# Patient Record
Sex: Male | Born: 1955 | Race: White | Hispanic: No | Marital: Single | Smoking: Former smoker
Health system: Southern US, Community
[De-identification: ages and names within clinical notes are randomized; demographics above are authoritative.]

## PROBLEM LIST (undated history)

## (undated) DIAGNOSIS — K649 Unspecified hemorrhoids: Secondary | ICD-10-CM

## (undated) DIAGNOSIS — F329 Major depressive disorder, single episode, unspecified: Secondary | ICD-10-CM

## (undated) DIAGNOSIS — F419 Anxiety disorder, unspecified: Secondary | ICD-10-CM

## (undated) DIAGNOSIS — J449 Chronic obstructive pulmonary disease, unspecified: Secondary | ICD-10-CM

## (undated) DIAGNOSIS — Z9889 Other specified postprocedural states: Secondary | ICD-10-CM

## (undated) DIAGNOSIS — J189 Pneumonia, unspecified organism: Secondary | ICD-10-CM

## (undated) DIAGNOSIS — I10 Essential (primary) hypertension: Secondary | ICD-10-CM

## (undated) DIAGNOSIS — R112 Nausea with vomiting, unspecified: Secondary | ICD-10-CM

## (undated) DIAGNOSIS — Z8719 Personal history of other diseases of the digestive system: Secondary | ICD-10-CM

## (undated) DIAGNOSIS — R002 Palpitations: Secondary | ICD-10-CM

## (undated) DIAGNOSIS — Z923 Personal history of irradiation: Secondary | ICD-10-CM

## (undated) DIAGNOSIS — J45909 Unspecified asthma, uncomplicated: Secondary | ICD-10-CM

## (undated) DIAGNOSIS — F32A Depression, unspecified: Secondary | ICD-10-CM

## (undated) HISTORY — DX: Pneumonia, unspecified organism: J18.9

## (undated) HISTORY — DX: Personal history of other diseases of the digestive system: Z87.19

## (undated) HISTORY — DX: Palpitations: R00.2

## (undated) HISTORY — DX: Other specified postprocedural states: Z98.890

## (undated) HISTORY — DX: Unspecified hemorrhoids: K64.9

## (undated) HISTORY — DX: Other specified postprocedural states: R11.2

## (undated) HISTORY — PX: TONSILLECTOMY: SUR1361

## (undated) HISTORY — PX: DENTAL SURGERY: SHX609

## (undated) HISTORY — PX: SKIN CANCER EXCISION: SHX779

## (undated) HISTORY — PX: NOSE SURGERY: SHX723

---

## 1998-04-15 ENCOUNTER — Emergency Department (HOSPITAL_COMMUNITY): Admission: EM | Admit: 1998-04-15 | Discharge: 1998-04-15 | Payer: Self-pay | Admitting: Emergency Medicine

## 1998-05-03 ENCOUNTER — Emergency Department (HOSPITAL_COMMUNITY): Admission: EM | Admit: 1998-05-03 | Discharge: 1998-05-04 | Payer: Self-pay | Admitting: Emergency Medicine

## 1999-02-21 ENCOUNTER — Emergency Department (HOSPITAL_COMMUNITY): Admission: EM | Admit: 1999-02-21 | Discharge: 1999-02-21 | Payer: Self-pay | Admitting: Emergency Medicine

## 1999-02-21 ENCOUNTER — Encounter: Payer: Self-pay | Admitting: Emergency Medicine

## 1999-09-08 ENCOUNTER — Emergency Department (HOSPITAL_COMMUNITY): Admission: EM | Admit: 1999-09-08 | Discharge: 1999-09-08 | Payer: Self-pay | Admitting: *Deleted

## 1999-10-18 ENCOUNTER — Encounter: Payer: Self-pay | Admitting: Emergency Medicine

## 1999-10-18 ENCOUNTER — Emergency Department (HOSPITAL_COMMUNITY): Admission: EM | Admit: 1999-10-18 | Discharge: 1999-10-19 | Payer: Self-pay | Admitting: Emergency Medicine

## 2000-03-13 ENCOUNTER — Emergency Department (HOSPITAL_COMMUNITY): Admission: EM | Admit: 2000-03-13 | Discharge: 2000-03-13 | Payer: Self-pay | Admitting: Emergency Medicine

## 2000-07-13 ENCOUNTER — Emergency Department (HOSPITAL_COMMUNITY): Admission: EM | Admit: 2000-07-13 | Discharge: 2000-07-13 | Payer: Self-pay | Admitting: Emergency Medicine

## 2000-07-13 ENCOUNTER — Encounter: Payer: Self-pay | Admitting: Emergency Medicine

## 2000-10-16 ENCOUNTER — Emergency Department (HOSPITAL_COMMUNITY): Admission: EM | Admit: 2000-10-16 | Discharge: 2000-10-16 | Payer: Self-pay | Admitting: Emergency Medicine

## 2000-10-16 ENCOUNTER — Encounter: Payer: Self-pay | Admitting: Emergency Medicine

## 2001-02-12 ENCOUNTER — Emergency Department (HOSPITAL_COMMUNITY): Admission: EM | Admit: 2001-02-12 | Discharge: 2001-02-12 | Payer: Self-pay | Admitting: Emergency Medicine

## 2001-08-02 ENCOUNTER — Encounter: Payer: Self-pay | Admitting: Emergency Medicine

## 2001-08-02 ENCOUNTER — Inpatient Hospital Stay (HOSPITAL_COMMUNITY): Admission: EM | Admit: 2001-08-02 | Discharge: 2001-08-02 | Payer: Self-pay | Admitting: Emergency Medicine

## 2002-01-23 ENCOUNTER — Encounter: Payer: Self-pay | Admitting: Emergency Medicine

## 2002-01-23 ENCOUNTER — Emergency Department (HOSPITAL_COMMUNITY): Admission: EM | Admit: 2002-01-23 | Discharge: 2002-01-23 | Payer: Self-pay | Admitting: Emergency Medicine

## 2002-03-25 ENCOUNTER — Emergency Department (HOSPITAL_COMMUNITY): Admission: EM | Admit: 2002-03-25 | Discharge: 2002-03-25 | Payer: Self-pay | Admitting: Emergency Medicine

## 2002-10-01 ENCOUNTER — Emergency Department (HOSPITAL_COMMUNITY): Admission: EM | Admit: 2002-10-01 | Discharge: 2002-10-01 | Payer: Self-pay | Admitting: Emergency Medicine

## 2003-06-04 ENCOUNTER — Inpatient Hospital Stay (HOSPITAL_COMMUNITY): Admission: EM | Admit: 2003-06-04 | Discharge: 2003-06-05 | Payer: Self-pay | Admitting: Psychiatry

## 2003-06-16 ENCOUNTER — Emergency Department (HOSPITAL_COMMUNITY): Admission: EM | Admit: 2003-06-16 | Discharge: 2003-06-17 | Payer: Self-pay | Admitting: *Deleted

## 2003-06-25 ENCOUNTER — Emergency Department (HOSPITAL_COMMUNITY): Admission: EM | Admit: 2003-06-25 | Discharge: 2003-06-25 | Payer: Self-pay | Admitting: Emergency Medicine

## 2003-06-28 ENCOUNTER — Emergency Department (HOSPITAL_COMMUNITY): Admission: EM | Admit: 2003-06-28 | Discharge: 2003-06-28 | Payer: Self-pay | Admitting: *Deleted

## 2004-07-21 ENCOUNTER — Emergency Department (HOSPITAL_COMMUNITY): Admission: EM | Admit: 2004-07-21 | Discharge: 2004-07-21 | Payer: Self-pay | Admitting: Emergency Medicine

## 2004-12-28 ENCOUNTER — Emergency Department (HOSPITAL_COMMUNITY): Admission: EM | Admit: 2004-12-28 | Discharge: 2004-12-28 | Payer: Self-pay | Admitting: Emergency Medicine

## 2005-11-07 ENCOUNTER — Emergency Department (HOSPITAL_COMMUNITY): Admission: EM | Admit: 2005-11-07 | Discharge: 2005-11-07 | Payer: Self-pay | Admitting: Emergency Medicine

## 2006-03-15 ENCOUNTER — Emergency Department (HOSPITAL_COMMUNITY): Admission: EM | Admit: 2006-03-15 | Discharge: 2006-03-15 | Payer: Self-pay | Admitting: Emergency Medicine

## 2006-12-08 ENCOUNTER — Emergency Department (HOSPITAL_COMMUNITY): Admission: EM | Admit: 2006-12-08 | Discharge: 2006-12-08 | Payer: Self-pay | Admitting: Emergency Medicine

## 2007-06-21 ENCOUNTER — Emergency Department (HOSPITAL_COMMUNITY): Admission: EM | Admit: 2007-06-21 | Discharge: 2007-06-21 | Payer: Self-pay | Admitting: Emergency Medicine

## 2007-08-22 ENCOUNTER — Emergency Department (HOSPITAL_COMMUNITY): Admission: EM | Admit: 2007-08-22 | Discharge: 2007-08-22 | Payer: Self-pay | Admitting: Emergency Medicine

## 2007-12-04 ENCOUNTER — Emergency Department (HOSPITAL_COMMUNITY): Admission: EM | Admit: 2007-12-04 | Discharge: 2007-12-04 | Payer: Self-pay | Admitting: Emergency Medicine

## 2008-07-17 ENCOUNTER — Emergency Department (HOSPITAL_COMMUNITY): Admission: EM | Admit: 2008-07-17 | Discharge: 2008-07-17 | Payer: Self-pay | Admitting: Emergency Medicine

## 2008-09-12 ENCOUNTER — Observation Stay (HOSPITAL_COMMUNITY): Admission: EM | Admit: 2008-09-12 | Discharge: 2008-09-13 | Payer: Self-pay | Admitting: Emergency Medicine

## 2008-10-29 ENCOUNTER — Encounter (HOSPITAL_COMMUNITY): Admission: RE | Admit: 2008-10-29 | Discharge: 2009-01-27 | Payer: Self-pay | Admitting: Interventional Cardiology

## 2009-05-14 ENCOUNTER — Ambulatory Visit: Payer: Self-pay | Admitting: Internal Medicine

## 2009-05-14 ENCOUNTER — Observation Stay (HOSPITAL_COMMUNITY): Admission: EM | Admit: 2009-05-14 | Discharge: 2009-05-15 | Payer: Self-pay | Admitting: Emergency Medicine

## 2009-05-15 ENCOUNTER — Encounter (INDEPENDENT_AMBULATORY_CARE_PROVIDER_SITE_OTHER): Payer: Self-pay | Admitting: Internal Medicine

## 2009-05-24 ENCOUNTER — Emergency Department (HOSPITAL_COMMUNITY): Admission: EM | Admit: 2009-05-24 | Discharge: 2009-05-24 | Payer: Self-pay | Admitting: Emergency Medicine

## 2009-08-15 ENCOUNTER — Emergency Department (HOSPITAL_COMMUNITY): Admission: EM | Admit: 2009-08-15 | Discharge: 2009-08-15 | Payer: Self-pay | Admitting: Emergency Medicine

## 2009-09-24 ENCOUNTER — Emergency Department (HOSPITAL_COMMUNITY): Admission: EM | Admit: 2009-09-24 | Discharge: 2009-09-24 | Payer: Self-pay | Admitting: Emergency Medicine

## 2009-11-28 ENCOUNTER — Emergency Department (HOSPITAL_COMMUNITY): Admission: EM | Admit: 2009-11-28 | Discharge: 2009-11-28 | Payer: Self-pay | Admitting: Emergency Medicine

## 2009-12-11 ENCOUNTER — Emergency Department (HOSPITAL_COMMUNITY): Admission: EM | Admit: 2009-12-11 | Discharge: 2009-12-11 | Payer: Self-pay | Admitting: Emergency Medicine

## 2010-04-05 ENCOUNTER — Emergency Department (HOSPITAL_COMMUNITY)
Admission: EM | Admit: 2010-04-05 | Discharge: 2010-04-05 | Payer: Self-pay | Source: Home / Self Care | Admitting: Emergency Medicine

## 2010-07-26 ENCOUNTER — Emergency Department (HOSPITAL_COMMUNITY)
Admission: EM | Admit: 2010-07-26 | Discharge: 2010-07-26 | Payer: Self-pay | Source: Home / Self Care | Admitting: Emergency Medicine

## 2010-08-30 ENCOUNTER — Emergency Department (HOSPITAL_COMMUNITY): Payer: Self-pay

## 2010-08-30 ENCOUNTER — Emergency Department (HOSPITAL_COMMUNITY)
Admission: EM | Admit: 2010-08-30 | Discharge: 2010-08-30 | Disposition: A | Discharge status: Home / Self Care | Payer: Self-pay | Attending: Emergency Medicine | Admitting: Emergency Medicine

## 2010-08-30 DIAGNOSIS — F411 Generalized anxiety disorder: Secondary | ICD-10-CM | POA: Insufficient documentation

## 2010-08-30 DIAGNOSIS — I10 Essential (primary) hypertension: Secondary | ICD-10-CM | POA: Insufficient documentation

## 2010-08-30 DIAGNOSIS — R0602 Shortness of breath: Secondary | ICD-10-CM | POA: Insufficient documentation

## 2010-08-30 DIAGNOSIS — I498 Other specified cardiac arrhythmias: Secondary | ICD-10-CM | POA: Insufficient documentation

## 2010-08-30 DIAGNOSIS — R079 Chest pain, unspecified: Secondary | ICD-10-CM | POA: Insufficient documentation

## 2010-08-30 LAB — DIFFERENTIAL
Basophils Absolute: 0.1 10*3/uL (ref 0.0–0.1)
Basophils Relative: 1 % (ref 0–1)
Eosinophils Absolute: 0.1 10*3/uL (ref 0.0–0.7)
Eosinophils Relative: 1 % (ref 0–5)
Lymphocytes Relative: 28 % (ref 12–46)
Lymphs Abs: 2.5 10*3/uL (ref 0.7–4.0)
Monocytes Absolute: 0.7 10*3/uL (ref 0.1–1.0)
Monocytes Relative: 8 % (ref 3–12)
Neutro Abs: 5.5 10*3/uL (ref 1.7–7.7)
Neutrophils Relative %: 63 % (ref 43–77)

## 2010-08-30 LAB — CBC
HCT: 42.7 % (ref 39.0–52.0)
Hemoglobin: 15 g/dL (ref 13.0–17.0)
MCH: 32.2 pg (ref 26.0–34.0)
MCHC: 35.1 g/dL (ref 30.0–36.0)
MCV: 91.6 fL (ref 78.0–100.0)
Platelets: 205 10*3/uL (ref 150–400)
RBC: 4.66 MIL/uL (ref 4.22–5.81)
RDW: 12.3 % (ref 11.5–15.5)
WBC: 8.9 10*3/uL (ref 4.0–10.5)

## 2010-08-30 LAB — POCT I-STAT, CHEM 8
BUN: 7 mg/dL (ref 6–23)
Calcium, Ion: 1.12 mmol/L (ref 1.12–1.32)
Chloride: 106 mEq/L (ref 96–112)
Creatinine, Ser: 0.9 mg/dL (ref 0.4–1.5)
Glucose, Bld: 107 mg/dL — ABNORMAL HIGH (ref 70–99)
HCT: 44 % (ref 39.0–52.0)
Hemoglobin: 15 g/dL (ref 13.0–17.0)
Potassium: 4.2 mEq/L (ref 3.5–5.1)
Sodium: 141 mEq/L (ref 135–145)
TCO2: 24 mmol/L (ref 0–100)

## 2010-08-30 LAB — POCT CARDIAC MARKERS
CKMB, poc: <1 ng/mL — ABNORMAL LOW (ref 1.0–8.0)
Myoglobin, poc: 42.9 ng/mL (ref 12–200)
Troponin i, poc: <0.05 ng/mL (ref 0.00–0.09)

## 2010-09-15 LAB — URINALYSIS, ROUTINE W REFLEX MICROSCOPIC
Bilirubin Urine: NEGATIVE
Glucose, UA: NEGATIVE mg/dL
Hgb urine dipstick: NEGATIVE
Ketones, ur: NEGATIVE mg/dL
Nitrite: NEGATIVE
Protein, ur: NEGATIVE mg/dL
Specific Gravity, Urine: 1.006 (ref 1.005–1.030)
Urobilinogen, UA: 0.2 mg/dL (ref 0.0–1.0)
pH: 6 (ref 5.0–8.0)

## 2010-09-15 LAB — URINE CULTURE
Colony Count: NO GROWTH
Culture  Setup Time: 201110031039
Culture: NO GROWTH

## 2010-09-22 LAB — CBC
HCT: 45.5 % (ref 39.0–52.0)
Hemoglobin: 15.9 g/dL (ref 13.0–17.0)
MCHC: 35.1 g/dL (ref 30.0–36.0)
MCV: 95.2 fL (ref 78.0–100.0)
Platelets: 230 10*3/uL (ref 150–400)
RBC: 4.77 MIL/uL (ref 4.22–5.81)
RDW: 12.3 % (ref 11.5–15.5)
WBC: 9.9 10*3/uL (ref 4.0–10.5)

## 2010-09-22 LAB — DIFFERENTIAL
Basophils Absolute: 0.1 10*3/uL (ref 0.0–0.1)
Basophils Relative: 1 % (ref 0–1)
Eosinophils Absolute: 0.2 10*3/uL (ref 0.0–0.7)
Eosinophils Relative: 2 % (ref 0–5)
Lymphocytes Relative: 25 % (ref 12–46)
Lymphs Abs: 2.5 10*3/uL (ref 0.7–4.0)
Monocytes Absolute: 0.7 10*3/uL (ref 0.1–1.0)
Monocytes Relative: 7 % (ref 3–12)
Neutro Abs: 6.6 10*3/uL (ref 1.7–7.7)
Neutrophils Relative %: 66 % (ref 43–77)

## 2010-09-22 LAB — BASIC METABOLIC PANEL
BUN: 7 mg/dL (ref 6–23)
CO2: 27 mEq/L (ref 19–32)
Calcium: 8.7 mg/dL (ref 8.4–10.5)
Chloride: 100 mEq/L (ref 96–112)
Creatinine, Ser: 0.65 mg/dL (ref 0.4–1.5)
GFR calc Af Amer: >60 mL/min (ref >60)
GFR calc non Af Amer: >60 mL/min (ref >60)
Glucose, Bld: 112 mg/dL — ABNORMAL HIGH (ref 70–99)
Potassium: 3.5 mEq/L (ref 3.5–5.1)
Sodium: 132 mEq/L — ABNORMAL LOW (ref 135–145)

## 2010-09-22 LAB — POCT CARDIAC MARKERS
CKMB, poc: <1 ng/mL — ABNORMAL LOW (ref 1.0–8.0)
Myoglobin, poc: 36.9 ng/mL (ref 12–200)
Troponin i, poc: <0.05 ng/mL (ref 0.00–0.09)

## 2010-09-22 LAB — D-DIMER, QUANTITATIVE: D-Dimer, Quant: 0.26 ug/mL-FEU (ref 0.00–0.48)

## 2010-09-25 ENCOUNTER — Emergency Department (HOSPITAL_COMMUNITY)
Admission: EM | Admit: 2010-09-25 | Discharge: 2010-09-25 | Disposition: A | Discharge status: Home / Self Care | Payer: Self-pay | Attending: Emergency Medicine | Admitting: Emergency Medicine

## 2010-09-25 DIAGNOSIS — F411 Generalized anxiety disorder: Secondary | ICD-10-CM | POA: Insufficient documentation

## 2010-09-25 DIAGNOSIS — I10 Essential (primary) hypertension: Secondary | ICD-10-CM | POA: Insufficient documentation

## 2010-09-25 DIAGNOSIS — T448X1A Poisoning by centrally-acting and adrenergic-neuron-blocking agents, accidental (unintentional), initial encounter: Secondary | ICD-10-CM | POA: Insufficient documentation

## 2010-09-25 DIAGNOSIS — R5381 Other malaise: Secondary | ICD-10-CM | POA: Insufficient documentation

## 2010-09-25 LAB — DIFFERENTIAL
Basophils Absolute: 0.1 10*3/uL (ref 0.0–0.1)
Lymphocytes Relative: 31 % (ref 12–46)
Lymphs Abs: 2.6 10*3/uL (ref 0.7–4.0)
Monocytes Absolute: 0.8 10*3/uL (ref 0.1–1.0)
Monocytes Relative: 9 % (ref 3–12)
Neutro Abs: 4.8 10*3/uL (ref 1.7–7.7)

## 2010-09-25 LAB — BASIC METABOLIC PANEL
BUN: 6 mg/dL (ref 6–23)
CO2: 25 mEq/L (ref 19–32)
Calcium: 9.2 mg/dL (ref 8.4–10.5)
Glucose, Bld: 100 mg/dL — ABNORMAL HIGH (ref 70–99)
Sodium: 137 mEq/L (ref 135–145)

## 2010-09-25 LAB — CBC
HCT: 44.2 % (ref 39.0–52.0)
Hemoglobin: 16 g/dL (ref 13.0–17.0)
MCHC: 36.2 g/dL — ABNORMAL HIGH (ref 30.0–36.0)
MCV: 91.5 fL (ref 78.0–100.0)

## 2010-10-06 LAB — COMPREHENSIVE METABOLIC PANEL
BUN: 4 mg/dL — ABNORMAL LOW (ref 6–23)
BUN: 10 mg/dL (ref 6–23)
Calcium: 9 mg/dL (ref 8.4–10.5)
Calcium: 8.9 mg/dL (ref 8.4–10.5)
Creatinine, Ser: 0.7 mg/dL (ref 0.4–1.5)
Glucose, Bld: 96 mg/dL (ref 70–99)
Glucose, Bld: 101 mg/dL — ABNORMAL HIGH (ref 70–99)
Sodium: 135 mEq/L (ref 135–145)
Sodium: 139 mEq/L (ref 135–145)
Total Protein: 6.9 g/dL (ref 6.0–8.3)
Total Protein: 6.7 g/dL (ref 6.0–8.3)

## 2010-10-06 LAB — POCT CARDIAC MARKERS
CKMB, poc: <1 ng/mL — ABNORMAL LOW (ref 1.0–8.0)
CKMB, poc: <1 ng/mL — ABNORMAL LOW (ref 1.0–8.0)
Myoglobin, poc: 70.8 ng/mL (ref 12–200)
Troponin i, poc: <0.05 ng/mL (ref 0.00–0.09)
Troponin i, poc: <0.05 ng/mL (ref 0.00–0.09)

## 2010-10-06 LAB — DIFFERENTIAL
Basophils Absolute: 0.2 10*3/uL — ABNORMAL HIGH (ref 0.0–0.1)
Lymphocytes Relative: 23 % (ref 12–46)
Lymphs Abs: 2.1 10*3/uL (ref 0.7–4.0)
Monocytes Absolute: 0.8 10*3/uL (ref 0.1–1.0)
Monocytes Relative: 7 % (ref 3–12)
Monocytes Relative: 8 % (ref 3–12)
Neutro Abs: 5 10*3/uL (ref 1.7–7.7)
Neutro Abs: 6.9 10*3/uL (ref 1.7–7.7)
Neutrophils Relative %: 65 % (ref 43–77)

## 2010-10-06 LAB — CARDIAC PANEL(CRET KIN+CKTOT+MB+TROPI)
Relative Index: INVALID (ref 0.0–2.5)
Total CK: 58 U/L (ref 7–232)
Troponin I: 0.02 ng/mL (ref 0.00–0.06)

## 2010-10-06 LAB — CBC
HCT: 45.8 % (ref 39.0–52.0)
Hemoglobin: 15.9 g/dL (ref 13.0–17.0)
Hemoglobin: 17 g/dL (ref 13.0–17.0)
MCHC: 34.8 g/dL (ref 30.0–36.0)
Platelets: 214 10*3/uL (ref 150–400)
RBC: 5.09 MIL/uL (ref 4.22–5.81)
RDW: 12.4 % (ref 11.5–15.5)
RDW: 12.7 % (ref 11.5–15.5)

## 2010-10-06 LAB — LIPID PANEL
Cholesterol: 147 mg/dL (ref 0–200)
HDL: 27 mg/dL — ABNORMAL LOW (ref >39)
LDL Cholesterol: 94 mg/dL (ref 0–99)
Total CHOL/HDL Ratio: 5.4 RATIO
Triglycerides: 132 mg/dL (ref <150)

## 2010-10-06 LAB — TSH: TSH: 1.226 u[IU]/mL (ref 0.350–4.500)

## 2010-10-06 LAB — D-DIMER, QUANTITATIVE: D-Dimer, Quant: 0.29 ug/mL-FEU (ref 0.00–0.48)

## 2010-10-06 LAB — BASIC METABOLIC PANEL
Calcium: 9.4 mg/dL (ref 8.4–10.5)
GFR calc Af Amer: >60 mL/min (ref >60)
GFR calc non Af Amer: >60 mL/min (ref >60)
Sodium: 140 mEq/L (ref 135–145)

## 2010-10-06 LAB — CK TOTAL AND CKMB (NOT AT ARMC): CK, MB: 0.7 ng/mL (ref 0.3–4.0)

## 2010-10-13 ENCOUNTER — Emergency Department (HOSPITAL_COMMUNITY)
Admission: EM | Admit: 2010-10-13 | Discharge: 2010-10-13 | Disposition: A | Discharge status: Home / Self Care | Payer: Self-pay | Attending: Emergency Medicine | Admitting: Emergency Medicine

## 2010-10-13 DIAGNOSIS — I10 Essential (primary) hypertension: Secondary | ICD-10-CM | POA: Insufficient documentation

## 2010-10-13 DIAGNOSIS — F411 Generalized anxiety disorder: Secondary | ICD-10-CM | POA: Insufficient documentation

## 2010-10-13 LAB — ETHANOL: Alcohol, Ethyl (B): <5 mg/dL (ref 0–10)

## 2010-10-13 LAB — CBC
MCHC: 34.8 g/dL (ref 30.0–36.0)
MCV: 90.9 fL (ref 78.0–100.0)
Platelets: 232 10*3/uL (ref 150–400)
RDW: 11.9 % (ref 11.5–15.5)
WBC: 8.7 10*3/uL (ref 4.0–10.5)

## 2010-10-13 LAB — RAPID URINE DRUG SCREEN, HOSP PERFORMED
Amphetamines: NOT DETECTED
Barbiturates: NOT DETECTED
Cocaine: NOT DETECTED
Opiates: NOT DETECTED
Tetrahydrocannabinol: NOT DETECTED

## 2010-10-13 LAB — DIFFERENTIAL
Basophils Absolute: 0.1 10*3/uL (ref 0.0–0.1)
Eosinophils Absolute: 0.1 10*3/uL (ref 0.0–0.7)
Eosinophils Relative: 1 % (ref 0–5)
Monocytes Absolute: 0.6 10*3/uL (ref 0.1–1.0)

## 2010-10-13 LAB — COMPREHENSIVE METABOLIC PANEL
Alkaline Phosphatase: 91 U/L (ref 39–117)
BUN: 5 mg/dL — ABNORMAL LOW (ref 6–23)
Chloride: 98 mEq/L (ref 96–112)
Glucose, Bld: 104 mg/dL — ABNORMAL HIGH (ref 70–99)
Potassium: 3.8 mEq/L (ref 3.5–5.1)
Total Bilirubin: 0.7 mg/dL (ref 0.3–1.2)

## 2010-10-14 LAB — CARDIAC PANEL(CRET KIN+CKTOT+MB+TROPI)
Relative Index: INVALID (ref 0.0–2.5)
Relative Index: INVALID (ref 0.0–2.5)
Troponin I: <0.01 ng/mL (ref 0.00–0.06)

## 2010-10-14 LAB — DIFFERENTIAL
Lymphocytes Relative: 19 % (ref 12–46)
Lymphs Abs: 2 10*3/uL (ref 0.7–4.0)
Neutro Abs: 7.5 10*3/uL (ref 1.7–7.7)
Neutrophils Relative %: 75 % (ref 43–77)

## 2010-10-14 LAB — POCT CARDIAC MARKERS
CKMB, poc: <1 ng/mL — ABNORMAL LOW (ref 1.0–8.0)
Myoglobin, poc: 44 ng/mL (ref 12–200)
Myoglobin, poc: 44.8 ng/mL (ref 12–200)

## 2010-10-14 LAB — CBC
Platelets: 207 10*3/uL (ref 150–400)
WBC: 10 10*3/uL (ref 4.0–10.5)

## 2010-10-14 LAB — LIPID PANEL
Cholesterol: 179 mg/dL (ref 0–200)
Total CHOL/HDL Ratio: 7.2 RATIO

## 2010-10-14 LAB — POCT I-STAT, CHEM 8
BUN: 6 mg/dL (ref 6–23)
Creatinine, Ser: 0.8 mg/dL (ref 0.4–1.5)
Hemoglobin: 15.6 g/dL (ref 13.0–17.0)
Potassium: 4 mEq/L (ref 3.5–5.1)
Sodium: 140 mEq/L (ref 135–145)

## 2010-10-15 ENCOUNTER — Emergency Department (HOSPITAL_COMMUNITY)
Admission: EM | Admit: 2010-10-15 | Discharge: 2010-10-15 | Disposition: A | Discharge status: Home / Self Care | Payer: Self-pay | Attending: Emergency Medicine | Admitting: Emergency Medicine

## 2010-10-15 DIAGNOSIS — F341 Dysthymic disorder: Secondary | ICD-10-CM | POA: Insufficient documentation

## 2010-10-15 DIAGNOSIS — I10 Essential (primary) hypertension: Secondary | ICD-10-CM | POA: Insufficient documentation

## 2010-10-15 LAB — DIFFERENTIAL
Basophils Absolute: 0.1 10*3/uL (ref 0.0–0.1)
Lymphocytes Relative: 25 % (ref 12–46)
Monocytes Absolute: 0.8 10*3/uL (ref 0.1–1.0)
Neutro Abs: 6.6 10*3/uL (ref 1.7–7.7)
Neutrophils Relative %: 66 % (ref 43–77)

## 2010-10-15 LAB — COMPREHENSIVE METABOLIC PANEL
Albumin: 4.1 g/dL (ref 3.5–5.2)
BUN: 6 mg/dL (ref 6–23)
Creatinine, Ser: 0.77 mg/dL (ref 0.4–1.5)
Glucose, Bld: 86 mg/dL (ref 70–99)
Total Protein: 7.6 g/dL (ref 6.0–8.3)

## 2010-10-15 LAB — CBC
HCT: 45.5 % (ref 39.0–52.0)
Hemoglobin: 15.9 g/dL (ref 13.0–17.0)
MCHC: 34.9 g/dL (ref 30.0–36.0)
RBC: 4.94 MIL/uL (ref 4.22–5.81)
WBC: 10 10*3/uL (ref 4.0–10.5)

## 2010-10-15 LAB — RAPID URINE DRUG SCREEN, HOSP PERFORMED
Amphetamines: NOT DETECTED
Benzodiazepines: POSITIVE — AB
Cocaine: NOT DETECTED
Opiates: NOT DETECTED
Tetrahydrocannabinol: NOT DETECTED

## 2010-10-15 LAB — ETHANOL: Alcohol, Ethyl (B): <5 mg/dL (ref 0–10)

## 2010-11-16 NOTE — H&P (Signed)
Roger [name], Roger [name]                   ACCOUNT NO.:  [id number]   MEDICAL RECORD NO.:  [id number]          PATIENT TYPE:  INP   LOCATION:  3733                         FACILITY:  [facility]   PHYSICIAN:  Corinna L. Lendell Caprice, MDDATE OF BIRTH:  11/21/1955   DATE OF ADMISSION:  09/12/2008  DATE OF DISCHARGE:                              HISTORY & PHYSICAL   CHIEF COMPLAINT:  Chest pain.   HISTORY OF PRESENT ILLNESS:  Roger [name] is a 55 year old white male who  presents with about half an hour's worth of chest pressure.  It occurred  while he was walking to the store.  He has no pain currently.  It was  not positional.  He had no accompanying symptoms.  He has a history of  anxiety and chest pain in the past.  This feels different from previous.  He has never had a stress test.  He takes an aspirin a day as well as  atenolol.  He has a history of hypertension and tobacco abuse, but no  other cardiac risk factors.  He reports that he has been under a lot of  stress over the past year.   PAST MEDICAL HISTORY:  As above.   MEDICATIONS:  1. Aspirin 81 mg a day.  2. Atenolol 50 mg a day.  3. Xanax 1 mg as needed.   ALLERGIES:  Reports an allergy to DEMEROL and ERYTHROMYCIN.   SOCIAL HISTORY:  He is an unemployed gentleman who smokes half-a-pack of  cigarettes a day.  He does not drink or use drugs.  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   FAMILY HISTORY:  His father had lung cancer.  Mother had what sounds  like rheumatoid lung disease.   REVIEW OF SYSTEMS:  As above, otherwise negative.   PHYSICAL EXAMINATION:  VITAL SIGNS:  Temperature is 98.1, blood pressure  114/84, pulse 52, respiratory rate 19, oxygen saturation 99% on room  air.  GENERAL:  The patient is well nourished, well developed, in no acute  distress.  HEENT:  Normocephalic, atraumatic.  Pupils equal, round, and reactive to  light.  Sclerae nonicteric.  Moist mucous membranes.  NECK:   Supple.  No  carotid bruits.  No thyromegaly.  LUNGS:  Clear to auscultation bilaterally without wheezes, rhonchi, or  rales.  CARDIOVASCULAR:  Regular rhythm, slow rate.  No murmurs, gallops, or  rubs.  ABDOMEN:  Normal bowel sounds.  Soft, nontender, nondistended.  GU:  Deferred.  RECTAL:  Deferred.  EXTREMITIES:  No clubbing, cyanosis, or edema.  SKIN:  No rash.  PSYCHIATRIC:  Normal affect.  NEUROLOGIC:  Alert and oriented.  Cranial nerves and sensorimotor exam  are intact.   LABORATORY DATA:  CBC unremarkable.  Basic metabolic panel unremarkable.  Two sets of point of care enzymes negative.  EKG shows sinus bradycardia  with a rate of 43.  Chest x-ray shows chronic slight increased marking  at lung bases, possibly reflecting fibrosis, stable.   ASSESSMENT AND PLAN:  1. Chest pain:  The patient will be placed  on 23-hour observation.      Continue aspirin, atenolol.  He will be ruled out for MI, and if      stable can follow up for outpatient stress test.  2. Hypertension.  Continue atenolol.  3. Asymptomatic bradycardia.  Hold atenolol p.r.n. heart rate less      than 40.  He had no dizziness or other symptoms related to his      bradycardia.  4. Tobacco abuse, counseled against.  5. Anxiety.  Continue Xanax as needed.      Corinna L. Lendell Caprice, MD  Electronically Signed     CLS/MEDQ  D:  09/12/2008  T:  09/13/2008  Job:  [id number]   cc:   Oley Balm. Georgina Pillion, M.D.

## 2010-11-19 NOTE — Discharge Summary (Signed)
NAMEZEKE, Roger [name]                             ACCOUNT NO.:  [id number]   MEDICAL RECORD NO.:  [id number]                   PATIENT TYPE:  IPS   LOCATION:  0305                                 FACILITY:  [facility]   PHYSICIAN:  Geoffery Lyons, M.D.                   DATE OF BIRTH:  29-May-1956   DATE OF ADMISSION:  06/04/2003  DATE OF DISCHARGE:  06/05/2003                                 DISCHARGE SUMMARY   CHIEF COMPLAINT AND PRESENT ILLNESS:  This was the first admission to [facility] for this 55 year old single white male voluntarily  admitted.  History of depression, suicidal ideation with no plan.  Wife left  him three months prior to this admission.  No contact.  Got worried when she  did not call on his birthday and holidays.  Kept thinking about his  situation.  Denied any suicidal ideation.  Claimed that he loved himself too  much.  Decreased sleep.   PAST PSYCHIATRIC HISTORY:  First time at [facility].  Outpatient in  1999.   ALCOHOL/DRUG HISTORY:  Denies the use or abuse of any substances.   PAST MEDICAL HISTORY:  Arterial hypertension.   MEDICATIONS:  Atenolol 50 mg daily, Xanax 1 mg three times a day, imipramine  100 mg in the morning.   PHYSICAL EXAMINATION:  Performed and failed to show any acute findings.   MENTAL STATUS EXAM:  Alert, middle-aged, casually dressed male.  Cooperative  with good eye contact.  Speech clear.  Normal tempo and production.  Mood  depressed.  Affect depressed.  Thought processes coherent.  No delusional  ideas.  No suicidal or homicidal ideation.  No auditory or visual  hallucinations.  Cognition well-preserved.   ADMISSION DIAGNOSES:   AXIS I:  Depressive disorder not otherwise specified.   AXIS II:  No diagnosis.   AXIS III:  Arterial hypertension.   AXIS IV:  Moderate.   AXIS V:  Global Assessment of Functioning upon admission 40; highest Global  Assessment of Functioning in the last year 75.   LABORATORY  DATA:  CBC within normal limits.  Blood chemistry within normal  limits.  Thyroid profile within normal limits.  Drug screen positive for  benzodiazepines.   HOSPITAL COURSE:  He was admitted and started intensive individual and group  psychotherapy.  He was given Xanax 1 mg three times a day as needed,  atenolol 50 mg in the morning.  He was given Ambien for sleep.  On June 05, 2003, he was in full contact with reality.  There were no suicidal  ideation, no homicidal ideation.  He claimed he never meant to say he was  going to kill himself.  Admitted that he was having a hard time with the  holidays, when his wife did not send him a birthday card or Thanksgiving  card or call him.  Does say he will not kill himself.  He was wanting to be  discharged as he was ready to move on.  Was going to stay with his brother.  His brother was contacted and he did not see any problem with him returning  home.  Upon discharge, in full contact with reality.  No suicidal or  homicidal ideation.  Willing and motivated to pursue further outpatient  treatment.   DISCHARGE DIAGNOSES:   AXIS I:  Depressive disorder not otherwise specified.   AXIS II:  No diagnosis.   AXIS III:  Arterial hypertension.   AXIS IV:  Moderate.   AXIS V:  Global Assessment of Functioning upon discharge 60.   DISCHARGE MEDICATIONS:  1. Imipramine 100 mg at night.  2. Tenormin 50 mg daily.   FO[facility] UP[facility].                                               Geoffery Lyons, M.D.    IL/MEDQ  D:  06/19/2003  T:  06/20/2003  Job:  [id number]

## 2010-11-19 NOTE — H&P (Signed)
[facility]  Patient:    Roger [name], Roger [name] Visit Number: [id number] MRN: [id number]          Service Type: OBV Location: 2S 5621 02 Attending Physician:  Lyn Records. Iii Dictated by:   Darci Needle III M.D. Admit Date:  08/02/2001   CC:         Oley Balm. Georgina Pillion, M.D.             Dr. _________, [facility] Emergency Room                         History and Physical  REFERRING PHYSICIANS:  Oley Balm. Georgina Pillion, M.D., and ______ in the [facility] Emergency Room.  REASON FOR ADMISSION:  Chest pain.  SUBJECTIVE:  The patient is a 55 year old gentleman who states that he has been having sweats and head stuffiness for three days.  He came to the emergency room because of this, but in the emergency room somehow the history began to center around chest discomfort he has been having.  He tells me that the chest discomfort occurs only when he coughs.  It is left parasternal.  It is not precipitated by physical activity nor is it associated with shortness of breath.  He has had no productive sputum.  He denies hemoptysis.  There has been no weight loss.  He has not eaten in three days.  The patient is very tearful because his wife and son left him two days ago. He is tearful.  He does not know where they are.  He has not been able to sleep.  No history of heart disease.  No cardiopulmonary complaints.  PAST MEDICAL HISTORY: 1. Hypertension. 2. Anxiety. 3. Stress.  MEDICATIONS: 1. Xanax 1 mg p.o. t.i.d. 2. Atenolol 50 mg p.o. q.d. 3. Aspirin one per day.  HABITS:  Smokes greater than one pack per day.  Denies ethanol consumption.  FAMILY HISTORY:  Mother and father died of cancer.  His father died of lung cancer.  He is not sure what type of cancer his mother had.  REVIEW OF SYSTEMS:  No recent weight loss.  Denies neurological complaints. He has been evaluated at [facility] because of anxiety and was started on Xanax because of  this.  Early stages of COPD based on the patients history on information he has obtained from [place] B. Georgina Pillion, M.D.  No history of heart murmur.  No orthopnea or PND.  No lower extremity swelling.  No syncope.  ALLERGIES:  ERYTHROMYCIN.  OBJECTIVE:  Blood pressure 147/92, heart rate 80, respirations 16 and nonlabored.  SKIN:  Color is clear.  HEENT:  Unremarkable.  NECK:  No JVD, carotid bruits, or thyromegaly.  LUNGS:  Clear to auscultation and percussion.  CARDIAC:  Examination is unremarkable.  No murmurs, rubs, or gallops are heard.  ABDOMEN:  Soft.  Liver and spleen not palpable.  Bowel sounds normal.  EXTREMITIES:  No edema.  Pulses are 2+ and symmetric in the upper and lower extremities.  NEUROLOGIC:  Exam is normal.  No motor or sensory deficits were noted.  LABORATORY DATA:  The potassium was 3.0.  Other components of the patients chemical profile are normal with the exception of blood sugar, which is 176. Initial cardiac enzymes are pending.  The chest x-ray has not been personally reviewed by me.  The EKG reveals nonspecific T-wave abnormalities secondary to hypokalemia. Dictated by:   Barry Dienes. B.  Leia Alf M.D. Attending Physician:  Lyn Records. Iii DD:  08/02/01 TD:  08/02/01 Job: [id number]

## 2010-12-04 ENCOUNTER — Emergency Department (HOSPITAL_COMMUNITY)
Admission: EM | Admit: 2010-12-04 | Discharge: 2010-12-04 | Disposition: A | Discharge status: Home / Self Care | Payer: Self-pay | Attending: Emergency Medicine | Admitting: Emergency Medicine

## 2010-12-04 ENCOUNTER — Emergency Department (HOSPITAL_COMMUNITY): Payer: Self-pay

## 2010-12-04 DIAGNOSIS — R059 Cough, unspecified: Secondary | ICD-10-CM | POA: Insufficient documentation

## 2010-12-04 DIAGNOSIS — R05 Cough: Secondary | ICD-10-CM | POA: Insufficient documentation

## 2010-12-04 DIAGNOSIS — I10 Essential (primary) hypertension: Secondary | ICD-10-CM | POA: Insufficient documentation

## 2010-12-04 DIAGNOSIS — R0989 Other specified symptoms and signs involving the circulatory and respiratory systems: Secondary | ICD-10-CM | POA: Insufficient documentation

## 2010-12-04 DIAGNOSIS — J40 Bronchitis, not specified as acute or chronic: Secondary | ICD-10-CM | POA: Insufficient documentation

## 2010-12-04 DIAGNOSIS — R0609 Other forms of dyspnea: Secondary | ICD-10-CM | POA: Insufficient documentation

## 2010-12-18 ENCOUNTER — Emergency Department (HOSPITAL_COMMUNITY): Payer: Self-pay

## 2010-12-18 ENCOUNTER — Emergency Department (HOSPITAL_COMMUNITY)
Admission: EM | Admit: 2010-12-18 | Discharge: 2010-12-18 | Disposition: A | Discharge status: Home / Self Care | Payer: Self-pay | Attending: Emergency Medicine | Admitting: Emergency Medicine

## 2010-12-18 DIAGNOSIS — F3289 Other specified depressive episodes: Secondary | ICD-10-CM | POA: Insufficient documentation

## 2010-12-18 DIAGNOSIS — R05 Cough: Secondary | ICD-10-CM | POA: Insufficient documentation

## 2010-12-18 DIAGNOSIS — Z7982 Long term (current) use of aspirin: Secondary | ICD-10-CM | POA: Insufficient documentation

## 2010-12-18 DIAGNOSIS — Z79899 Other long term (current) drug therapy: Secondary | ICD-10-CM | POA: Insufficient documentation

## 2010-12-18 DIAGNOSIS — R059 Cough, unspecified: Secondary | ICD-10-CM | POA: Insufficient documentation

## 2010-12-18 DIAGNOSIS — J3489 Other specified disorders of nose and nasal sinuses: Secondary | ICD-10-CM | POA: Insufficient documentation

## 2010-12-18 DIAGNOSIS — I10 Essential (primary) hypertension: Secondary | ICD-10-CM | POA: Insufficient documentation

## 2010-12-18 DIAGNOSIS — R6883 Chills (without fever): Secondary | ICD-10-CM | POA: Insufficient documentation

## 2010-12-18 DIAGNOSIS — F329 Major depressive disorder, single episode, unspecified: Secondary | ICD-10-CM | POA: Insufficient documentation

## 2011-01-03 ENCOUNTER — Emergency Department (HOSPITAL_COMMUNITY)
Admission: EM | Admit: 2011-01-03 | Discharge: 2011-01-03 | Disposition: A | Discharge status: Home / Self Care | Payer: Self-pay | Attending: Emergency Medicine | Admitting: Emergency Medicine

## 2011-01-03 DIAGNOSIS — R5383 Other fatigue: Secondary | ICD-10-CM | POA: Insufficient documentation

## 2011-01-03 DIAGNOSIS — H9209 Otalgia, unspecified ear: Secondary | ICD-10-CM | POA: Insufficient documentation

## 2011-01-03 DIAGNOSIS — J329 Chronic sinusitis, unspecified: Secondary | ICD-10-CM | POA: Insufficient documentation

## 2011-01-03 DIAGNOSIS — J3489 Other specified disorders of nose and nasal sinuses: Secondary | ICD-10-CM | POA: Insufficient documentation

## 2011-01-03 DIAGNOSIS — R509 Fever, unspecified: Secondary | ICD-10-CM | POA: Insufficient documentation

## 2011-01-03 DIAGNOSIS — R51 Headache: Secondary | ICD-10-CM | POA: Insufficient documentation

## 2011-01-03 DIAGNOSIS — I10 Essential (primary) hypertension: Secondary | ICD-10-CM | POA: Insufficient documentation

## 2011-01-03 DIAGNOSIS — H60399 Other infective otitis externa, unspecified ear: Secondary | ICD-10-CM | POA: Insufficient documentation

## 2011-01-03 DIAGNOSIS — R0982 Postnasal drip: Secondary | ICD-10-CM | POA: Insufficient documentation

## 2011-01-03 DIAGNOSIS — R5381 Other malaise: Secondary | ICD-10-CM | POA: Insufficient documentation

## 2011-03-22 ENCOUNTER — Emergency Department (HOSPITAL_COMMUNITY)
Admission: EM | Admit: 2011-03-22 | Discharge: 2011-03-22 | Disposition: A | Discharge status: Home / Self Care | Payer: Self-pay | Attending: Emergency Medicine | Admitting: Emergency Medicine

## 2011-03-22 DIAGNOSIS — F329 Major depressive disorder, single episode, unspecified: Secondary | ICD-10-CM | POA: Insufficient documentation

## 2011-03-22 DIAGNOSIS — Z7982 Long term (current) use of aspirin: Secondary | ICD-10-CM | POA: Insufficient documentation

## 2011-03-22 DIAGNOSIS — H9209 Otalgia, unspecified ear: Secondary | ICD-10-CM | POA: Insufficient documentation

## 2011-03-22 DIAGNOSIS — H938X9 Other specified disorders of ear, unspecified ear: Secondary | ICD-10-CM | POA: Insufficient documentation

## 2011-03-22 DIAGNOSIS — Z79899 Other long term (current) drug therapy: Secondary | ICD-10-CM | POA: Insufficient documentation

## 2011-03-22 DIAGNOSIS — I10 Essential (primary) hypertension: Secondary | ICD-10-CM | POA: Insufficient documentation

## 2011-03-22 DIAGNOSIS — F3289 Other specified depressive episodes: Secondary | ICD-10-CM | POA: Insufficient documentation

## 2011-03-22 DIAGNOSIS — H60399 Other infective otitis externa, unspecified ear: Secondary | ICD-10-CM | POA: Insufficient documentation

## 2011-03-22 LAB — GLUCOSE, CAPILLARY: Glucose-Capillary: 103 mg/dL — ABNORMAL HIGH (ref 70–99)

## 2011-03-25 LAB — I-STAT 8, (EC8 V) (CONVERTED LAB)
BUN: 6
Bicarbonate: 28.1 — ABNORMAL HIGH
Chloride: 105
Glucose, Bld: 110 — ABNORMAL HIGH
HCT: 51
Hemoglobin: 17.3 — ABNORMAL HIGH
Sodium: 138
pCO2, Ven: 50.7 — ABNORMAL HIGH

## 2011-03-25 LAB — POCT CARDIAC MARKERS
CKMB, poc: <1 — ABNORMAL LOW
Operator id: 288331
Troponin i, poc: <0.05

## 2011-03-28 ENCOUNTER — Emergency Department (HOSPITAL_COMMUNITY)
Admission: EM | Admit: 2011-03-28 | Discharge: 2011-03-28 | Disposition: A | Discharge status: Home / Self Care | Payer: Self-pay | Attending: Emergency Medicine | Admitting: Emergency Medicine

## 2011-03-28 DIAGNOSIS — F341 Dysthymic disorder: Secondary | ICD-10-CM | POA: Insufficient documentation

## 2011-03-28 DIAGNOSIS — I10 Essential (primary) hypertension: Secondary | ICD-10-CM | POA: Insufficient documentation

## 2011-03-28 DIAGNOSIS — I491 Atrial premature depolarization: Secondary | ICD-10-CM | POA: Insufficient documentation

## 2011-03-28 DIAGNOSIS — Z79899 Other long term (current) drug therapy: Secondary | ICD-10-CM | POA: Insufficient documentation

## 2011-03-28 DIAGNOSIS — R002 Palpitations: Secondary | ICD-10-CM | POA: Insufficient documentation

## 2011-03-28 LAB — TROPONIN I: Troponin I: <0.3 ng/mL (ref <0.30)

## 2011-03-28 LAB — POCT I-STAT, CHEM 8
Chloride: 103 mEq/L (ref 96–112)
Glucose, Bld: 109 mg/dL — ABNORMAL HIGH (ref 70–99)
HCT: 45 % (ref 39.0–52.0)
Hemoglobin: 15.3 g/dL (ref 13.0–17.0)
Potassium: 3.8 mEq/L (ref 3.5–5.1)
Sodium: 140 mEq/L (ref 135–145)

## 2011-03-28 LAB — CK TOTAL AND CKMB (NOT AT ARMC)
CK, MB: 2 ng/mL (ref 0.3–4.0)
Relative Index: 1.7 (ref 0.0–2.5)

## 2011-03-31 LAB — POCT I-STAT, CHEM 8
Chloride: 107
Creatinine, Ser: 0.8
HCT: 43
Hemoglobin: 14.6
Potassium: 4.3
Sodium: 139

## 2011-03-31 LAB — DIFFERENTIAL
Eosinophils Absolute: 0.2
Lymphs Abs: 2.7
Neutro Abs: 6.1
Neutrophils Relative %: 63

## 2011-03-31 LAB — POCT CARDIAC MARKERS
CKMB, poc: 1.4
Myoglobin, poc: 110
Myoglobin, poc: 73.5
Troponin i, poc: <0.05

## 2011-03-31 LAB — CBC
MCV: 94.4
Platelets: 245
RBC: 4.38
WBC: 9.8

## 2011-05-30 ENCOUNTER — Emergency Department (HOSPITAL_COMMUNITY)
Admission: EM | Admit: 2011-05-30 | Discharge: 2011-05-30 | Disposition: A | Discharge status: Home / Self Care | Payer: BC Managed Care – PPO | Attending: Emergency Medicine | Admitting: Emergency Medicine

## 2011-05-30 ENCOUNTER — Encounter: Payer: Self-pay | Admitting: Emergency Medicine

## 2011-05-30 DIAGNOSIS — R059 Cough, unspecified: Secondary | ICD-10-CM | POA: Insufficient documentation

## 2011-05-30 DIAGNOSIS — R070 Pain in throat: Secondary | ICD-10-CM | POA: Insufficient documentation

## 2011-05-30 DIAGNOSIS — Z79899 Other long term (current) drug therapy: Secondary | ICD-10-CM | POA: Insufficient documentation

## 2011-05-30 DIAGNOSIS — R05 Cough: Secondary | ICD-10-CM | POA: Insufficient documentation

## 2011-05-30 DIAGNOSIS — H5710 Ocular pain, unspecified eye: Secondary | ICD-10-CM | POA: Insufficient documentation

## 2011-05-30 DIAGNOSIS — Z7982 Long term (current) use of aspirin: Secondary | ICD-10-CM | POA: Insufficient documentation

## 2011-05-30 DIAGNOSIS — F17200 Nicotine dependence, unspecified, uncomplicated: Secondary | ICD-10-CM | POA: Insufficient documentation

## 2011-05-30 DIAGNOSIS — J3489 Other specified disorders of nose and nasal sinuses: Secondary | ICD-10-CM | POA: Insufficient documentation

## 2011-05-30 DIAGNOSIS — J329 Chronic sinusitis, unspecified: Secondary | ICD-10-CM | POA: Insufficient documentation

## 2011-05-30 HISTORY — DX: Essential (primary) hypertension: I10

## 2011-05-30 HISTORY — DX: Anxiety disorder, unspecified: F41.9

## 2011-05-30 MED ORDER — AMOXICILLIN 500 MG PO CAPS: 500.0000 mg | ORAL_CAPSULE | Freq: Three times a day (TID) | ORAL | Status: AC

## 2011-05-30 MED ORDER — FLUTICASONE PROPIONATE 50 MCG/ACT NA SUSP: 2.0000 | Freq: Every day | NASAL | Status: DC

## 2011-05-30 NOTE — ED Provider Notes (Signed)
History     CSN: [id number] Arrival date & time: 05/30/2011  4:52 PM   First MD Initiated Contact with Patient 05/30/11 1702      Chief Complaint  Patient presents with  . Cough     Patient is a 55 y.o. male presenting with cough. The history is provided by the patient.  Cough This is a recurrent problem. The current episode started more than 1 week ago. The problem occurs hourly. The problem has not changed since onset.The cough is non-productive. There has been no fever. Associated symptoms include ear congestion, ear pain, rhinorrhea and sore throat. Pertinent negatives include no shortness of breath. Associated symptoms comments: Nasal congestion .  pt reports cough for about two weeks Also reports nasal congestion/drainage.  Reports while bending forward he feels slightly dizzy but no other cp/sob/dizziness reported.   Past Medical History  Diagnosis Date  . Anxiety   . Hypertension     Past Surgical History  Procedure Date  . Tonsillectomy     No family history on file.  History  Substance Use Topics  . Smoking status: Current Everyday Smoker  . Smokeless tobacco: Not on file  . Alcohol Use: No      Review of Systems  HENT: Positive for ear pain, sore throat and rhinorrhea.   Respiratory: Positive for cough. Negative for shortness of breath.     Allergies  Azithromycin and Sulfa antibiotics  Home Medications   Current Outpatient Rx  Name Route Sig Dispense Refill  . ASPIRIN 81 MG PO TABS Oral Take 81 mg by mouth daily.      . ATENOLOL 100 MG PO TABS Oral Take 100 mg by mouth daily.      . AMOXICILLIN 500 MG PO CAPS Oral Take 1 capsule (500 mg total) by mouth 3 (three) times daily. 21 capsule 0  . FLUTICASONE PROPIONATE 50 MCG/ACT NA SUSP Nasal Place 2 sprays into the nose daily. 16 g 0    BP 127/71  Pulse 50  Temp(Src) 97 F (36.1 C) (Oral)  Resp 14  SpO2 100%  Physical Exam  CONSTITUTIONAL: Well developed/well nourished HEAD AND FACE:  Normocephalic/atraumatic EYES: EOMI/PERRL ENMT: Mucous membranes moist, nasal congestion NECK: supple no meningeal signs SPINE:entire spine nontender CV: S1/S2 noted, no murmurs/rubs/gallops noted LUNGS: Lungs are clear to auscultation bilaterally, no apparent distress ABDOMEN: soft, nontender, no rebound or guarding NEURO: Pt is awake/alert, moves all extremitiesx4 EXTREMITIES: pulses normal, full ROM SKIN: warm, color normal PSYCH: no abnormalities of mood noted   ED Course  Procedures    1. Sinusitis     Pt well appearing.  Reports sinus pain/congestion for two weeks, will start abx and flonase.  I cancelled his CXR as his lungs were clear, no tachypnea/distress.  He walks around room in no distress For dizziness, I advised him this could be due to his atenolol and low HR but he does not want to stop this.  I advised him to f/u with his PCP  MDM  Nursing notes reviewed and considered in documentation          Joya Gaskins, MD 05/30/11 Paulo Fruit

## 2011-05-30 NOTE — ED Notes (Signed)
Cough  For a few days  Then bent over and got dizzy ,having stuffy head and felt congestion in chest

## 2011-06-10 ENCOUNTER — Ambulatory Visit: Payer: Self-pay

## 2011-06-10 ENCOUNTER — Other Ambulatory Visit: Payer: Self-pay | Admitting: Occupational Medicine

## 2011-06-10 DIAGNOSIS — M549 Dorsalgia, unspecified: Secondary | ICD-10-CM

## 2011-06-10 DIAGNOSIS — R52 Pain, unspecified: Secondary | ICD-10-CM

## 2011-06-10 DIAGNOSIS — M25579 Pain in unspecified ankle and joints of unspecified foot: Secondary | ICD-10-CM

## 2011-06-10 DIAGNOSIS — M25569 Pain in unspecified knee: Secondary | ICD-10-CM

## 2011-08-10 ENCOUNTER — Emergency Department (HOSPITAL_COMMUNITY)
Admission: EM | Admit: 2011-08-10 | Discharge: 2011-08-10 | Disposition: A | Discharge status: Home / Self Care | Payer: BC Managed Care – PPO | Attending: Emergency Medicine | Admitting: Emergency Medicine

## 2011-08-10 ENCOUNTER — Encounter (HOSPITAL_COMMUNITY): Payer: Self-pay | Admitting: *Deleted

## 2011-08-10 ENCOUNTER — Emergency Department (HOSPITAL_COMMUNITY)
Admission: EM | Admit: 2011-08-10 | Discharge: 2011-08-10 | Discharge status: Against Medical Advice | Payer: BC Managed Care – PPO | Attending: Emergency Medicine | Admitting: Emergency Medicine

## 2011-08-10 ENCOUNTER — Emergency Department (HOSPITAL_COMMUNITY): Payer: BC Managed Care – PPO

## 2011-08-10 DIAGNOSIS — M545 Low back pain, unspecified: Secondary | ICD-10-CM | POA: Insufficient documentation

## 2011-08-10 DIAGNOSIS — Z79899 Other long term (current) drug therapy: Secondary | ICD-10-CM | POA: Insufficient documentation

## 2011-08-10 DIAGNOSIS — S335XXA Sprain of ligaments of lumbar spine, initial encounter: Secondary | ICD-10-CM | POA: Insufficient documentation

## 2011-08-10 DIAGNOSIS — M546 Pain in thoracic spine: Secondary | ICD-10-CM | POA: Insufficient documentation

## 2011-08-10 DIAGNOSIS — M25559 Pain in unspecified hip: Secondary | ICD-10-CM | POA: Insufficient documentation

## 2011-08-10 DIAGNOSIS — R109 Unspecified abdominal pain: Secondary | ICD-10-CM | POA: Insufficient documentation

## 2011-08-10 DIAGNOSIS — M79609 Pain in unspecified limb: Secondary | ICD-10-CM | POA: Insufficient documentation

## 2011-08-10 DIAGNOSIS — T148XXA Other injury of unspecified body region, initial encounter: Secondary | ICD-10-CM

## 2011-08-10 DIAGNOSIS — I10 Essential (primary) hypertension: Secondary | ICD-10-CM | POA: Insufficient documentation

## 2011-08-10 DIAGNOSIS — M5380 Other specified dorsopathies, site unspecified: Secondary | ICD-10-CM | POA: Insufficient documentation

## 2011-08-10 DIAGNOSIS — X58XXXA Exposure to other specified factors, initial encounter: Secondary | ICD-10-CM | POA: Insufficient documentation

## 2011-08-10 DIAGNOSIS — Z7982 Long term (current) use of aspirin: Secondary | ICD-10-CM | POA: Insufficient documentation

## 2011-08-10 LAB — URINALYSIS, ROUTINE W REFLEX MICROSCOPIC
Glucose, UA: NEGATIVE mg/dL
Hgb urine dipstick: NEGATIVE
Specific Gravity, Urine: 1.002 — ABNORMAL LOW (ref 1.005–1.030)

## 2011-08-10 MED ORDER — METHOCARBAMOL 500 MG PO TABS: 500.0000 mg | ORAL_TABLET | Freq: Two times a day (BID) | ORAL | Status: DC

## 2011-08-10 MED ORDER — PREDNISONE 10 MG PO TABS: 20.0000 mg | ORAL_TABLET | Freq: Every day | ORAL | Status: DC

## 2011-08-10 NOTE — ED Notes (Signed)
Pt was seen here briefly and had to leave but is now back to be evaluated for his back pain.  Ambulatory at triage

## 2011-08-10 NOTE — ED Notes (Signed)
Unable to locate pt in waiting room and lobby x 2

## 2011-08-10 NOTE — ED Notes (Signed)
Unable to locate pt in waiting room and lobby.

## 2011-08-10 NOTE — ED Notes (Signed)
Pt began having right hip pain with pain in right and lower back with intermittent right leg numbness.  Pt has been having this since Monday and it has not been associated with any trauma 

## 2011-08-10 NOTE — ED Notes (Signed)
Pt c/o left lower back pain with radiation around into groin with some frequency and dysuria. Also reports some sporatic episodes of shortness of breath. Pt noted in no acute distress. Resp are unlabored. Ambulated to tx area without difficulty or shortness of breath. Skin is warm and dry. Awaiting md evaluation.

## 2011-08-10 NOTE — ED Notes (Signed)
Unable to locate patient in lobby 

## 2011-08-10 NOTE — ED Notes (Signed)
Patient transported to X-ray via stretcher in no acute distress.

## 2011-08-10 NOTE — ED Notes (Signed)
Pt began having right hip pain with pain in right and lower back with intermittent right leg numbness.  Pt has been having this since Monday and it has not been associated with any trauma

## 2011-08-10 NOTE — ED Provider Notes (Signed)
History     CSN: [id number]  Arrival date & time 08/10/11  1542   First MD Initiated Contact with Patient 08/10/11 1750      Chief Complaint  Patient presents with  . Back Pain    (Consider location/radiation/quality/duration/timing/severity/associated sxs/prior treatment) Patient is a 56 y.o. male presenting with back pain. The history is provided by the patient.  Back Pain    patient here with left flank and lower back pain x3 days. No urinary symptoms. Denies any recent history,. Pain is described as dull in nature starts in his left flank and goes up his thoracic spine and down his left leg. Symptoms worse with walking and movement and twisting. Better with rest. Denies any syncope with this. No fever, vomiting, diarrhea. No medications taken prior to arrival  Past Medical History  Diagnosis Date  . Anxiety   . Hypertension     Past Surgical History  Procedure Date  . Tonsillectomy     No family history on file.  History  Substance Use Topics  . Smoking status: Current Everyday Smoker  . Smokeless tobacco: Not on file  . Alcohol Use: No      Review of Systems  Musculoskeletal: Positive for back pain.  All other systems reviewed and are negative.    Allergies  Azithromycin; Demerol; and Sulfa antibiotics  Home Medications   Current Outpatient Rx  Name Route Sig Dispense Refill  . ALPRAZOLAM 1 MG PO TABS Oral Take 1 mg by mouth 4 (four) times daily.    . ASPIRIN 81 MG PO TABS Oral Take 81 mg by mouth daily.      . ATENOLOL 100 MG PO TABS Oral Take 50 mg by mouth daily.     Marland Kitchen FLUTICASONE PROPIONATE 50 MCG/ACT NA SUSP Nasal Place 2 sprays into the nose daily. 16 g 0    BP 123/74  Pulse 64  Temp(Src) 98 F (36.7 C) (Oral)  Resp 16  SpO2 96%  Physical Exam  Nursing note and vitals reviewed. Constitutional: He is oriented to person, place, and time. He appears well-developed and well-nourished.  Non-toxic appearance. No distress.  HENT:  Head:  Normocephalic and atraumatic.  Eyes: Conjunctivae, EOM and lids are normal. Pupils are equal, round, and reactive to light.  Neck: Normal range of motion. Neck supple. No tracheal deviation present. No mass present.  Cardiovascular: Normal rate, regular rhythm and normal heart sounds.  Exam reveals no gallop.   No murmur heard. Pulmonary/Chest: Effort normal and breath sounds normal. No stridor. No respiratory distress. He has no decreased breath sounds. He has no wheezes. He has no rhonchi. He has no rales.  Abdominal: Soft. Normal appearance and bowel sounds are normal. He exhibits no distension. There is no tenderness. There is no rebound and no CVA tenderness.  Musculoskeletal: Normal range of motion. He exhibits no edema and no tenderness.       Right shoulder: He exhibits pain and spasm.       Arms: Neurological: He is alert and oriented to person, place, and time. He has normal strength. No cranial nerve deficit or sensory deficit. GCS eye subscore is 4. GCS verbal subscore is 5. GCS motor subscore is 6.  Skin: Skin is warm and dry. No abrasion and no rash noted.  Psychiatric: He has a normal mood and affect. His speech is normal and behavior is normal.    ED Course  Procedures (including critical care time)   Labs Reviewed  URINALYSIS, ROUTINE W  REFLEX MICROSCOPIC   No results found.   No diagnosis found.    MDM  Urinalysis and x-rays negative. Suspect patient has musculoskeletal strain. We'll treat with muscle relaxants and anti-inflammatories        Toy Baker, MD 08/10/11 [phone]

## 2011-08-15 ENCOUNTER — Emergency Department (HOSPITAL_COMMUNITY)
Admission: EM | Admit: 2011-08-15 | Discharge: 2011-08-15 | Disposition: A | Discharge status: Home / Self Care | Payer: BC Managed Care – PPO | Attending: Emergency Medicine | Admitting: Emergency Medicine

## 2011-08-15 ENCOUNTER — Encounter (HOSPITAL_COMMUNITY): Payer: Self-pay | Admitting: *Deleted

## 2011-08-15 DIAGNOSIS — Z7982 Long term (current) use of aspirin: Secondary | ICD-10-CM | POA: Insufficient documentation

## 2011-08-15 DIAGNOSIS — Z79899 Other long term (current) drug therapy: Secondary | ICD-10-CM | POA: Insufficient documentation

## 2011-08-15 DIAGNOSIS — I10 Essential (primary) hypertension: Secondary | ICD-10-CM | POA: Insufficient documentation

## 2011-08-15 DIAGNOSIS — F341 Dysthymic disorder: Secondary | ICD-10-CM | POA: Insufficient documentation

## 2011-08-15 DIAGNOSIS — F419 Anxiety disorder, unspecified: Secondary | ICD-10-CM

## 2011-08-15 HISTORY — DX: Major depressive disorder, single episode, unspecified: F32.9

## 2011-08-15 HISTORY — DX: Depression, unspecified: F32.A

## 2011-08-15 LAB — CBC
MCH: 32.1 pg (ref 26.0–34.0)
MCHC: 35 g/dL (ref 30.0–36.0)
MCV: 91.6 fL (ref 78.0–100.0)
Platelets: 228 10*3/uL (ref 150–400)
RDW: 12.1 % (ref 11.5–15.5)

## 2011-08-15 LAB — RAPID URINE DRUG SCREEN, HOSP PERFORMED
Amphetamines: NOT DETECTED
Cocaine: NOT DETECTED
Opiates: NOT DETECTED
Tetrahydrocannabinol: NOT DETECTED

## 2011-08-15 LAB — COMPREHENSIVE METABOLIC PANEL
ALT: 11 U/L (ref 0–53)
AST: 17 U/L (ref 0–37)
Calcium: 9.8 mg/dL (ref 8.4–10.5)
GFR calc Af Amer: >90 mL/min (ref >90)
Glucose, Bld: 114 mg/dL — ABNORMAL HIGH (ref 70–99)
Sodium: 138 mEq/L (ref 135–145)
Total Protein: 7.9 g/dL (ref 6.0–8.3)

## 2011-08-15 LAB — DIFFERENTIAL
Basophils Absolute: 0.1 10*3/uL (ref 0.0–0.1)
Basophils Relative: 1 % (ref 0–1)
Eosinophils Absolute: 0.1 10*3/uL (ref 0.0–0.7)
Eosinophils Relative: 1 % (ref 0–5)

## 2011-08-15 NOTE — ED Notes (Signed)
PA-C notified

## 2011-08-15 NOTE — ED Notes (Signed)
Pt placed in paper scrubs, wanded by Security, pt with 1 belonging bag

## 2011-08-15 NOTE — ED Notes (Signed)
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

## 2011-08-15 NOTE — BH Assessment (Signed)
Assessment Note   BLU LORI is an 56 y.o. male. Patient with history of anxiety followed locally by [facility]. Presents today with concern about anxiety has a history of anxiety requesting to talk to someone. Sts that 2-3 weeks ago he had thoughts about "how the mind works". Patient reports difficulty and fixations on his thoughts. Sts that he doesn't want to "go crazy or loose his mind". Patient recently started a new job and doesn't want to loose his job. Patient sts that his counselor at [facility] told him that he "thinks to hard about things". Patient appears to need constant re-assurance that he is not having a "breakdown or going crazy" as he says. He denies that he is suicidal or homicidal. No AVH's. No alcohol or drug use. No prior history of mental health related symptoms other than depression and anxiety.   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 Anxiety   . Hypertension   . Depression    Axis IV: occupational problems and problems with primary support group Axis V: 51-60 moderate symptoms  Past Medical History:  Past Medical History  Diagnosis Date  . Anxiety   . Hypertension   . Depression     Past Surgical History  Procedure Date  . Tonsillectomy     Family History: No family history on file.  Social History:  reports that he has been smoking.  He does not have any smokeless tobacco history on file. He reports that he does not drink alcohol or use illicit drugs.  Additional Social History:  Alcohol / Drug Use Pain Medications: see listed meds Prescriptions: see listed meds Over the Counter: see listed meds  History of alcohol / drug use?: No history of alcohol / drug abuse Longest period of sobriety (when/how long): n/a Allergies:  Allergies  Allergen Reactions  . Azithromycin Nausea Only  . Demerol Other (See Comments)    Heart beats fast  . Sulfa Antibiotics Nausea And  Vomiting    Home Medications:  No current facility-administered medications on file as of 08/15/2011.   Medications Prior to Admission  Medication Sig Dispense Refill  . ALPRAZolam (XANAX) 1 MG tablet Take 1 mg by mouth 4 (four) times daily.      . methocarbamol (ROBAXIN) 500 MG tablet Take 1 tablet (500 mg total) by mouth 2 (two) times daily.  20 tablet  0  . predniSONE (DELTASONE) 10 MG tablet Take 2 tablets (20 mg total) by mouth daily.  10 tablet  0    OB/GYN Status:  No LMP for male patient.  General Assessment Data Location of Assessment: [facility] ED ACT Assessment:  (No) Living Arrangements: Alone Can pt return to current living arrangement?: Yes Admission Status: Voluntary Is patient capable of signing voluntary admission?: Yes Transfer from: Acute Hospital Referral Source: Self/Family/Friend  Education Status Is patient currently in school?: No  Risk to self Suicidal Ideation: No Suicidal Intent: No Is patient at risk for suicide?: No Suicidal Plan?: No Access to Means: No What has been your use of drugs/alcohol within the last 12 months?:  (n/a) Previous Attempts/Gestures: No How many times?:  (0) Other Self Harm Risks:  (n/a) Triggers for Past Attempts:  (no previous attempts) Intentional Self Injurious Behavior: None Family Suicide History: No Recent stressful life event(s):  (new job, bereavement of spouse-past 3-4 yrs ago) Persecutory voices/beliefs?: No Depression: No Depression Symptoms: Loss of interest in usual  pleasures;Despondent Substance abuse history and/or treatment for substance abuse?: No Suicide prevention information given to non-admitted patients: Not applicable  Risk to Others Homicidal Ideation: No Thoughts of Harm to Others: No Current Homicidal Intent: No Current Homicidal Plan: No Access to Homicidal Means: No Identified Victim:  (n/a) History of harm to others?: No Assessment of Violence: None Noted Violent Behavior Description:   (n/a) Does patient have access to weapons?: No Criminal Charges Pending?: No Does patient have a court date: No  Psychosis Hallucinations: None noted Delusions: None noted  Mental Status Report Appear/Hygiene:  (normal) Eye Contact: Good Motor Activity: Restlessness Speech: Logical/coherent Level of Consciousness: Alert Mood: Depressed;Anxious Affect: Anxious Anxiety Level: Severe Thought Processes: Relevant;Circumstantial Judgement: Unimpaired Orientation: Person;Place;Time;Situation Obsessive Compulsive Thoughts/Behaviors: None  Cognitive Functioning Concentration: Normal Memory: Recent Intact;Remote Intact IQ: Average Insight: Fair Impulse Control: Fair Appetite: Good Weight Loss:  (n/a) Weight Gain:  (n/a) Sleep: No Change Total Hours of Sleep:  (n/a) Vegetative Symptoms: None  Prior Inpatient Therapy Prior Inpatient Therapy: No Prior Therapy Dates:  (n/a) Prior Therapy Facilty/Provider(s):  (n/a) Reason for Treatment:  (n/a)  Prior Outpatient Therapy Prior Outpatient Therapy: Yes Prior Therapy Dates:  (current) Prior Therapy Facilty/Provider(s):  ([facility]) Reason for Treatment:  (depression and anxiety)  ADL Screening (condition at time of admission) Patient's cognitive ability adequate to safely complete daily activities?: No Patient able to express need for assistance with ADLs?: No Independently performs ADLs?: No Communication: Independent Dressing (OT): Independent Grooming: Independent Feeding: Independent Bathing: Independent Toileting: Independent In/Out Bed: Independent Walks in Home: Independent Weakness of Legs: None Weakness of Arms/Hands: None  Home Assistive Devices/Equipment Home Assistive Devices/Equipment: None  Therapy Consults (therapy consults require a physician order) PT Evaluation Needed: No OT Evalulation Needed: No SLP Evaluation Needed: No Abuse/Neglect Assessment (Assessment to be complete while  patient is alone) Physical Abuse: Denies Verbal Abuse: Denies Sexual Abuse: Denies Exploitation of patient/patient's resources: Denies Self-Neglect: Denies Values / Beliefs Cultural Requests During Hospitalization: None Spiritual Requests During Hospitalization: None   Advance Directives (For Healthcare) Advance Directive: Patient does not have advance directive Nutrition Screen Diet: Regular Unintentional weight loss greater than 10lbs within the last month: No Dysphagia: No Home Tube Feeding or Total Parenteral Nutrition (TPN): No Patient appears severely malnourished: No Pregnant or Lactating: No  Additional Information 1:1 In Past 12 Months?: No CIRT Risk: No Elopement Risk: No Does patient have medical clearance?: Yes     Disposition:  Disposition Disposition of Patient:  (Discharge home to follow up with current provider-[facility])  On Site Evaluation by:   Reviewed with Physician:     Octaviano Batty 08/15/2011 5:52 PM

## 2011-08-15 NOTE — ED Notes (Signed)
Jessie Foot, ACT aware

## 2011-08-15 NOTE — ED Provider Notes (Addendum)
History     CSN: [id number]  Arrival date & time 08/15/11  1210   First MD Initiated Contact with Patient 08/15/11 1508      Chief Complaint  Patient presents with  . V70.1    (Consider location/radiation/quality/duration/timing/severity/associated sxs/prior treatment) The history is provided by the patient.   patient is a 56 year old male with history of anxiety followed locally by [facility] from his psychiatric standpoint. Presents today with concern about anxiety has a history of anxiety requesting to talk to someone but denies that he is suicidal or homicidal. Denies any medical complaints.  Past Medical History  Diagnosis Date  . Anxiety   . Hypertension   . Depression     Past Surgical History  Procedure Date  . Tonsillectomy     No family history on file.  History  Substance Use Topics  . Smoking status: Current Everyday Smoker -- 0.5 packs/day  . Smokeless tobacco: Not on file  . Alcohol Use: No      Review of Systems  Constitutional: Negative for fever, chills and fatigue.  HENT: Negative for congestion, neck pain and neck stiffness.   Eyes: Negative for redness and visual disturbance.  Respiratory: Negative for cough, chest tightness and shortness of breath.   Cardiovascular: Negative for chest pain and palpitations.  Gastrointestinal: Negative for nausea, vomiting, abdominal pain and diarrhea.  Genitourinary: Negative for dysuria.  Musculoskeletal: Negative for back pain.  Skin: Negative for rash.  Neurological: Negative for weakness and headaches.  Hematological: Does not bruise/bleed easily.  Psychiatric/Behavioral: Negative for suicidal ideas and agitation.    Allergies  Azithromycin; Demerol; and Sulfa antibiotics  Home Medications   Current Outpatient Rx  Name Route Sig Dispense Refill  . ALPRAZOLAM 1 MG PO TABS Oral Take 1 mg by mouth 4 (four) times daily.    . ASPIRIN EC 325 MG PO TBEC Oral Take 325 mg by mouth daily.    . ATENOLOL  50 MG PO TABS Oral Take 50 mg by mouth daily.    . IBUPROFEN 200 MG PO TABS Oral Take 400 mg by mouth 2 (two) times daily.    Marland Kitchen METHOCARBAMOL 500 MG PO TABS Oral Take 1 tablet (500 mg total) by mouth 2 (two) times daily. 20 tablet 0  . PREDNISONE 10 MG PO TABS Oral Take 2 tablets (20 mg total) by mouth daily. 10 tablet 0    BP 133/85  Pulse 60  Temp(Src) 97.4 F (36.3 C) (Oral)  Resp 16  Ht 5' 11.75" (1.822 m)  Wt 180 lb (81.647 kg)  BMI 24.58 kg/m2  SpO2 100%  Physical Exam  Nursing note and vitals reviewed. Constitutional: He is oriented to person, place, and time. He appears well-developed and well-nourished. No distress.  HENT:  Head: Normocephalic and atraumatic.  Mouth/Throat: Oropharynx is clear and moist.  Eyes: Conjunctivae and EOM are normal. Pupils are equal, round, and reactive to light.  Neck: Normal range of motion. Neck supple.  Cardiovascular: Normal rate, regular rhythm and normal heart sounds.   No murmur heard. Pulmonary/Chest: Effort normal and breath sounds normal. No respiratory distress. He has no wheezes. He has no rales. He exhibits no tenderness.  Abdominal: Soft. Bowel sounds are normal. There is no tenderness.  Musculoskeletal: Normal range of motion. He exhibits no tenderness.  Lymphadenopathy:    He has no cervical adenopathy.  Neurological: He is alert and oriented to person, place, and time. No cranial nerve deficit. He exhibits normal muscle tone. Coordination normal.  Skin: Skin is warm. No rash noted.    ED Course  Procedures (including critical care time)  Labs Reviewed  URINE RAPID DRUG SCREEN (HOSP PERFORMED) - Abnormal; Notable for the following:    Benzodiazepines POSITIVE (*)    All other components within normal limits  COMPREHENSIVE METABOLIC PANEL - Abnormal; Notable for the following:    Glucose, Bld 114 (*)    All other components within normal limits  ETHANOL  CBC  DIFFERENTIAL   Results for orders placed during the  hospital encounter of 08/15/11  URINE RAPID DRUG SCREEN (HOSP PERFORMED)      Component Value Range   Opiates NONE DETECTED  NONE DETECTED    Cocaine NONE DETECTED  NONE DETECTED    Benzodiazepines POSITIVE (*) NONE DETECTED    Amphetamines NONE DETECTED  NONE DETECTED    Tetrahydrocannabinol NONE DETECTED  NONE DETECTED    Barbiturates NONE DETECTED  NONE DETECTED   ETHANOL      Component Value Range   Alcohol, Ethyl (B) <11  0 - 11 (mg/dL)  CBC      Component Value Range   WBC 8.6  4.0 - 10.5 (K/uL)   RBC 5.14  4.22 - 5.81 (MIL/uL)   Hemoglobin 16.5  13.0 - 17.0 (g/dL)   HCT 40.9  81.1 - 91.4 (%)   MCV 91.6  78.0 - 100.0 (fL)   MCH 32.1  26.0 - 34.0 (pg)   MCHC 35.0  30.0 - 36.0 (g/dL)   RDW 78.2  95.6 - 21.3 (%)   Platelets 228  150 - 400 (K/uL)  DIFFERENTIAL      Component Value Range   Neutrophils Relative 66  43 - 77 (%)   Neutro Abs 5.7  1.7 - 7.7 (K/uL)   Lymphocytes Relative 24  12 - 46 (%)   Lymphs Abs 2.1  0.7 - 4.0 (K/uL)   Monocytes Relative 7  3 - 12 (%)   Monocytes Absolute 0.6  0.1 - 1.0 (K/uL)   Eosinophils Relative 1  0 - 5 (%)   Eosinophils Absolute 0.1  0.0 - 0.7 (K/uL)   Basophils Relative 1  0 - 1 (%)   Basophils Absolute 0.1  0.0 - 0.1 (K/uL)  COMPREHENSIVE METABOLIC PANEL      Component Value Range   Sodium 138  135 - 145 (mEq/L)   Potassium 4.1  3.5 - 5.1 (mEq/L)   Chloride 102  96 - 112 (mEq/L)   CO2 27  19 - 32 (mEq/L)   Glucose, Bld 114 (*) 70 - 99 (mg/dL)   BUN 7  6 - 23 (mg/dL)   Creatinine, Ser 0.86  0.50 - 1.35 (mg/dL)   Calcium 9.8  8.4 - 57.8 (mg/dL)   Total Protein 7.9  6.0 - 8.3 (g/dL)   Albumin 4.1  3.5 - 5.2 (g/dL)   AST 17  0 - 37 (U/L)   ALT 11  0 - 53 (U/L)   Alkaline Phosphatase 98  39 - 117 (U/L)   Total Bilirubin 0.4  0.3 - 1.2 (mg/dL)   GFR calc non Af Amer >90  >90 (mL/min)   GFR calc Af Amer >90  >90 (mL/min)    No results found.   1. Anxiety       MDM   The patient medically cleared for behavioral health  team to talk to. Patient insists on talking to behavioral health denies suicidal or homicidal ideation. Will not be open with me to tell me  exactly why he wants to talk with them. Has a therapist locally at [facility]. Lasts all this therapist on Monday a week ago.    Addendum: Patient evaluated by behavioral health team and cleared to go home we'll followup with his [facility] therapist. They confirm no suicidal or homicidal ideas.    Shelda Jakes, MD 08/15/11 1705  Shelda Jakes, MD 08/15/11 [phone]

## 2011-08-18 ENCOUNTER — Emergency Department (HOSPITAL_COMMUNITY)
Admission: EM | Admit: 2011-08-18 | Discharge: 2011-08-18 | Disposition: A | Discharge status: Home / Self Care | Payer: BC Managed Care – PPO | Attending: Emergency Medicine | Admitting: Emergency Medicine

## 2011-08-18 ENCOUNTER — Encounter (HOSPITAL_COMMUNITY): Payer: Self-pay | Admitting: Emergency Medicine

## 2011-08-18 DIAGNOSIS — Z7982 Long term (current) use of aspirin: Secondary | ICD-10-CM | POA: Insufficient documentation

## 2011-08-18 DIAGNOSIS — I10 Essential (primary) hypertension: Secondary | ICD-10-CM | POA: Insufficient documentation

## 2011-08-18 DIAGNOSIS — M545 Low back pain, unspecified: Secondary | ICD-10-CM | POA: Insufficient documentation

## 2011-08-18 DIAGNOSIS — M549 Dorsalgia, unspecified: Secondary | ICD-10-CM

## 2011-08-18 DIAGNOSIS — R259 Unspecified abnormal involuntary movements: Secondary | ICD-10-CM | POA: Insufficient documentation

## 2011-08-18 DIAGNOSIS — F341 Dysthymic disorder: Secondary | ICD-10-CM | POA: Insufficient documentation

## 2011-08-18 DIAGNOSIS — Z711 Person with feared health complaint in whom no diagnosis is made: Secondary | ICD-10-CM

## 2011-08-18 DIAGNOSIS — F17200 Nicotine dependence, unspecified, uncomplicated: Secondary | ICD-10-CM | POA: Insufficient documentation

## 2011-08-18 DIAGNOSIS — M62838 Other muscle spasm: Secondary | ICD-10-CM | POA: Insufficient documentation

## 2011-08-18 DIAGNOSIS — Z79899 Other long term (current) drug therapy: Secondary | ICD-10-CM | POA: Insufficient documentation

## 2011-08-18 DIAGNOSIS — IMO0001 Reserved for inherently not codable concepts without codable children: Secondary | ICD-10-CM | POA: Insufficient documentation

## 2011-08-18 DIAGNOSIS — M79609 Pain in unspecified limb: Secondary | ICD-10-CM | POA: Insufficient documentation

## 2011-08-18 NOTE — ED Provider Notes (Signed)
History     CSN: [id number]  Arrival date & time 08/18/11  8657   First MD Initiated Contact with Patient 08/18/11 1039      Chief Complaint  Patient presents with  . Back Pain    (Consider location/radiation/quality/duration/timing/severity/associated sxs/prior treatment) HPI Comments: The patient reports that he was seen in the emergency department last week for low back pain after an injury, which he got out of his car and started having left low back pain with mild radiation down his left thigh.  States that his pain is getting much better.  He has been resting and taking his medications and he came to the emergency department for recheck to make sure that he can go back to work on Monday as well as for a note on Monday.  Denies any fevers, denies any new injury, denies any weakness or numbness of his lower extremities, denies any urinary symptoms, or loss of control of bowel or bladder.  Patient has also been having intermittent muscle spasm in his left neck with associated left eye twitching.  States that this is happening intermittently, has not connected to anything in particular.  Denies any chest pain, shortness of breath.  States that since symptoms are not associated with exertion.  Patient was seen in ED 08/10/11 for back pain and 08/15/11 for anxiety.    Patient is a 56 y.o. male presenting with back pain. The history is provided by the patient.  Back Pain     Past Medical History  Diagnosis Date  . Anxiety   . Hypertension   . Depression     Past Surgical History  Procedure Date  . Tonsillectomy     History reviewed. No pertinent family history.  History  Substance Use Topics  . Smoking status: Current Everyday Smoker -- 0.5 packs/day  . Smokeless tobacco: Not on file  . Alcohol Use: No      Review of Systems  Musculoskeletal: Positive for back pain.  All other systems reviewed and are negative.    Allergies  Demerol; Erythromycin; and Sulfa  antibiotics  Home Medications   Current Outpatient Rx  Name Route Sig Dispense Refill  . ALPRAZOLAM 1 MG PO TABS Oral Take 1 mg by mouth 4 (four) times daily.    . ASPIRIN EC 325 MG PO TBEC Oral Take 325 mg by mouth daily.    . ATENOLOL 50 MG PO TABS Oral Take 50 mg by mouth daily.    . IBUPROFEN 200 MG PO TABS Oral Take 400 mg by mouth every 6 (six) hours as needed. For pain      BP 126/81  Pulse 61  Temp(Src) 97.4 F (36.3 C) (Oral)  Resp 18  SpO2 99%  Physical Exam  Constitutional: He is oriented to person, place, and time. He appears well-developed and well-nourished.  HENT:  Head: Normocephalic and atraumatic.  Neck: Neck supple.  Cardiovascular: Normal rate, regular rhythm and normal heart sounds.   Pulmonary/Chest: Breath sounds normal. No respiratory distress. He has no wheezes. He has no rales. He exhibits no tenderness.  Abdominal: Soft. Bowel sounds are normal. There is no tenderness.  Musculoskeletal: Normal range of motion. He exhibits no edema and no tenderness.       Cervical back: Normal.       Thoracic back: Normal.       Lumbar back: Normal.       Arms:      Mild tenderness left lower back and left buttock.  No bony tenderness.  Full AROM spine without pain or difficulty.    Neurological: He is alert and oriented to person, place, and time. He has normal strength. No cranial nerve deficit or sensory deficit. He exhibits normal muscle tone. Coordination and gait normal. GCS eye subscore is 4. GCS verbal subscore is 5. GCS motor subscore is 6.       CN II-XII intact, EOMs intact, no pronator drift, grip strengths equal bilaterally; finger to nose, heel to shin, rapid alternating movements are normal; strength 5/5 in all extremities, sensation is intact, gait is normal.       ED Course  Procedures (including critical care time)  Labs Reviewed - No data to display No results found.   1. Back pain   2. Physically well but worried       MDM  Patient  presents to ED for third time this month, stating that he just wants to be rechecked so he can go back to work following "injury" to left lower back while getting out of a car.  Patient denies new injury, has no neurological complaints or deficits.  Patient was also seen recently for "anxiety" but would only speak with ACT team.  Patient today reports twitching below his left eye that he was concerned about.  I suspect this is related to the anxiety he has been experiencing.  His neurological exam is normal.  I have encouraged patient to ease back into his regular activities and go back to work on Monday, follow up with his PCP as needed.          Dillard Cannon [place], [place] 08/18/11 [phone]

## 2011-08-18 NOTE — Discharge Instructions (Signed)
Back Exercises Back exercises help treat and prevent back injuries. The goal of back exercises is to increase the strength of your abdominal and back muscles and the flexibility of your back. These exercises should be started when you no longer have back pain. Back exercises include:  Pelvic Tilt. Lie on your back with your knees bent. Tilt your pelvis until the lower part of your back is against the floor. Hold this position 5 to 10 sec and repeat 5 to 10 times.   Knee to Chest. Pull first 1 knee up against your chest and hold for 20 to 30 seconds, repeat this with the other knee, and then both knees. This may be done with the other leg straight or bent, whichever feels better.   Sit-Ups or Curl-Ups. Bend your knees 90 degrees. Start with tilting your pelvis, and do a partial, slow sit-up, lifting your trunk only 30 to 45 degrees off the floor. Take at least 2 to 3 seconds for each sit-up. Do not do sit-ups with your knees out straight. If partial sit-ups are difficult, simply do the above but with only tightening your abdominal muscles and holding it as directed.   Hip-Lift. Lie on your back with your knees flexed 90 degrees. Push down with your feet and shoulders as you raise your hips a couple inches off the floor; hold for 10 seconds, repeat 5 to 10 times.   Back arches. Lie on your stomach, propping yourself up on bent elbows. Slowly press on your hands, causing an arch in your low back. Repeat 3 to 5 times. Any initial stiffness and discomfort should lessen with repetition over time.   Shoulder-Lifts. Lie face down with arms beside your body. Keep hips and torso pressed to floor as you slowly lift your head and shoulders off the floor.  Do not overdo your exercises, especially in the beginning. Exercises may cause you some mild back discomfort which lasts for a few minutes; however, if the pain is more severe, or lasts for more than 15 minutes, do not continue exercises until you see your  caregiver. Improvement with exercise therapy for back problems is slow.  See your caregivers for assistance with developing a proper back exercise program. Document Released: 07/28/2004 Document Revised: 02/16/2011 Document Reviewed: 06/20/2005 [employer] Patient Information 2012 [employer].Back Pain, Adult Low back pain is very common. About 1 in 5 people have back pain.The cause of low back pain is rarely dangerous. The pain often gets better over time.About half of people with a sudden onset of back pain feel better in just 2 weeks. About 8 in 10 people feel better by 6 weeks.  CAUSES Some common causes of back pain include:  Strain of the muscles or ligaments supporting the spine.   Wear and tear (degeneration) of the spinal discs.   Arthritis.   Direct injury to the back.  DIAGNOSIS Most of the time, the direct cause of low back pain is not known.However, back pain can be treated effectively even when the exact cause of the pain is unknown.Answering your caregiver's questions about your overall health and symptoms is one of the most accurate ways to make sure the cause of your pain is not dangerous. If your caregiver needs more information, he or she may order lab work or imaging tests (X-rays or MRIs).However, even if imaging tests show changes in your back, this usually does not require surgery. HOME CARE INSTRUCTIONS For many people, back pain returns.Since low back pain is rarely   dangerous, it is often a condition that people can learn to manageon their own.   Remain active. It is stressful on the back to sit or stand in one place. Do not sit, drive, or stand in one place for more than 30 minutes at a time. Take short walks on level surfaces as soon as pain allows.Try to increase the length of time you walk each day.   Do not stay in bed.Resting more than 1 or 2 days can delay your recovery.   Do not avoid exercise or work.Your body is made to move.It is not dangerous  to be active, even though your back may hurt.Your back will likely heal faster if you return to being active before your pain is gone.   Pay attention to your body when you bend and lift. Many people have less discomfortwhen lifting if they bend their knees, keep the load close to their bodies,and avoid twisting. Often, the most comfortable positions are those that put less stress on your recovering back.   Find a comfortable position to sleep. Use a firm mattress and lie on your side with your knees slightly bent. If you lie on your back, put a pillow under your knees.   Only take over-the-counter or prescription medicines as directed by your caregiver. Over-the-counter medicines to reduce pain and inflammation are often the most helpful.Your caregiver may prescribe muscle relaxant drugs.These medicines help dull your pain so you can more quickly return to your normal activities and healthy exercise.   Put ice on the injured area.   Put ice in a plastic bag.   Place a towel between your skin and the bag.   Leave the ice on for 15 to 20 minutes, 3 to 4 times a day for the first 2 to 3 days. After that, ice and heat may be alternated to reduce pain and spasms.   Ask your caregiver about trying back exercises and gentle massage. This may be of some benefit.   Avoid feeling anxious or stressed.Stress increases muscle tension and can worsen back pain.It is important to recognize when you are anxious or stressed and learn ways to manage it.Exercise is a great option.  SEEK MEDICAL CARE IF:  You have pain that is not relieved with rest or medicine.   You have pain that does not improve in 1 week.   You have new symptoms.   You are generally not feeling well.  SEEK IMMEDIATE MEDICAL CARE IF:   You have pain that radiates from your back into your legs.   You develop new bowel or bladder control problems.   You have unusual weakness or numbness in your arms or legs.   You develop  nausea or vomiting.   You develop abdominal pain.   You feel faint.  Document Released: 06/20/2005 Document Revised: 03/02/2011 Document Reviewed: 11/08/2010 [employer] Patient Information 2012 [employer]. 

## 2011-08-18 NOTE — ED Notes (Signed)
Pt c/o left lower back pain x several weeks; pt sts was seen here last week for same and wants to be rechecked; pt sts still having pain; pt denies new injury; pt sts needs note to go back to work

## 2011-08-19 NOTE — ED Provider Notes (Signed)
Medical screening examination/treatment/procedure(s) were performed by non-physician practitioner and as supervising physician I was immediately available for consultation/collaboration.   Loren Racer, MD 08/19/11 [phone]

## 2011-08-22 ENCOUNTER — Emergency Department (HOSPITAL_COMMUNITY)
Admission: EM | Admit: 2011-08-22 | Discharge: 2011-08-22 | Disposition: A | Discharge status: Home / Self Care | Payer: BC Managed Care – PPO | Attending: Emergency Medicine | Admitting: Emergency Medicine

## 2011-08-22 ENCOUNTER — Encounter (HOSPITAL_COMMUNITY): Payer: Self-pay

## 2011-08-22 DIAGNOSIS — F411 Generalized anxiety disorder: Secondary | ICD-10-CM | POA: Insufficient documentation

## 2011-08-22 DIAGNOSIS — F17200 Nicotine dependence, unspecified, uncomplicated: Secondary | ICD-10-CM | POA: Insufficient documentation

## 2011-08-22 DIAGNOSIS — F419 Anxiety disorder, unspecified: Secondary | ICD-10-CM

## 2011-08-22 DIAGNOSIS — I10 Essential (primary) hypertension: Secondary | ICD-10-CM | POA: Insufficient documentation

## 2011-08-22 LAB — CBC
MCHC: 35.9 g/dL (ref 30.0–36.0)
Platelets: 186 10*3/uL (ref 150–400)
RDW: 12.1 % (ref 11.5–15.5)
WBC: 9 10*3/uL (ref 4.0–10.5)

## 2011-08-22 LAB — COMPREHENSIVE METABOLIC PANEL
BUN: 7 mg/dL (ref 6–23)
CO2: 25 mEq/L (ref 19–32)
Chloride: 98 mEq/L (ref 96–112)
Creatinine, Ser: 0.69 mg/dL (ref 0.50–1.35)
GFR calc non Af Amer: >90 mL/min (ref >90)
Glucose, Bld: 89 mg/dL (ref 70–99)
Total Bilirubin: 0.3 mg/dL (ref 0.3–1.2)

## 2011-08-22 LAB — RAPID URINE DRUG SCREEN, HOSP PERFORMED
Benzodiazepines: POSITIVE — AB
Cocaine: NOT DETECTED

## 2011-08-22 LAB — ETHANOL: Alcohol, Ethyl (B): <11 mg/dL (ref 0–11)

## 2011-08-22 MED ORDER — ONDANSETRON HCL 4 MG PO TABS: 4.0000 mg | ORAL_TABLET | Freq: Three times a day (TID) | ORAL | Status: DC | PRN

## 2011-08-22 MED ORDER — IBUPROFEN 600 MG PO TABS: 600.0000 mg | ORAL_TABLET | Freq: Three times a day (TID) | ORAL | Status: DC | PRN

## 2011-08-22 MED ORDER — NICOTINE 21 MG/24HR TD PT24: 21.0000 mg | MEDICATED_PATCH | Freq: Every day | TRANSDERMAL | Status: DC

## 2011-08-22 MED ORDER — ALPRAZOLAM 1 MG PO TABS
1.0000 mg | ORAL_TABLET | Freq: Four times a day (QID) | ORAL | Status: DC
  Administered 2011-08-22: 1 mg via ORAL
  Filled 2011-08-22: qty 1

## 2011-08-22 MED ORDER — ACETAMINOPHEN 325 MG PO TABS: 650.0000 mg | ORAL_TABLET | ORAL | Status: DC | PRN

## 2011-08-22 MED ORDER — ATENOLOL 50 MG PO TABS
50.0000 mg | ORAL_TABLET | Freq: Every day | ORAL | Status: DC
  Filled 2011-08-22 (×2): qty 1

## 2011-08-22 MED ORDER — ALUM & MAG HYDROXIDE-SIMETH 200-200-20 MG/5ML PO SUSP: 30.0000 mL | ORAL | Status: DC | PRN

## 2011-08-22 MED ORDER — LORAZEPAM 1 MG PO TABS: 1.0000 mg | ORAL_TABLET | Freq: Three times a day (TID) | ORAL | Status: DC | PRN

## 2011-08-22 MED ORDER — ASPIRIN EC 325 MG PO TBEC
325.0000 mg | DELAYED_RELEASE_TABLET | Freq: Every day | ORAL | Status: DC
  Filled 2011-08-22: qty 1

## 2011-08-22 NOTE — ED Notes (Signed)
Pt. Had one belonging bag and is locked in locker 43

## 2011-08-22 NOTE — ED Notes (Signed)
Pt states that he had a hard week and messed his back up, not suicidal at this time

## 2011-08-22 NOTE — ED Provider Notes (Signed)
Medical screening examination/treatment/procedure(s) were performed by non-physician practitioner and as supervising physician I was immediately available for consultation/collaboration.  Vallory Oetken T Mekisha Bittel, MD 08/22/11 2306 

## 2011-08-22 NOTE — ED Notes (Signed)
Care assumed

## 2011-08-22 NOTE — BH Assessment (Signed)
Assessment Note   Roger [name] is an 56 y.o. male who presents to the ED with concerns about anxiety. Pt states he has been wondering "if I'm going to loose it" and "who am I." Patient reports history of anxiety disorder, stating he was diagnosed in his teens and believes that it is genetic. Pt reports he recieves outpatient therapy from [facility] weekly. Pt denies any SI, HI, AHVH, and SA. Pt endorses mild depression, stating his self-confidence has been low since his divorce 3 years ago. Pt is able to contract for safety and is being given referrals for psychiatrists.   Axis I: Anxiety Disorder NOS Axis II: Deferred Axis III:  Past Medical History  Diagnosis Date  . Anxiety   . Hypertension   . Depression    Axis IV: other psychosocial or environmental problems Axis V: 41-50 serious symptoms  Past Medical History:  Past Medical History  Diagnosis Date  . Anxiety   . Hypertension   . Depression     Past Surgical History  Procedure Date  . Tonsillectomy     Family History: History reviewed. No pertinent family history.  Social History:  reports that he has been smoking.  He does not have any smokeless tobacco history on file. He reports that he does not drink alcohol or use illicit drugs.  Additional Social History:  Alcohol / Drug Use History of alcohol / drug use?: No history of alcohol / drug abuse Allergies:  Allergies  Allergen Reactions  . Demerol Other (See Comments)    Heart beats fast  . Erythromycin Nausea And Vomiting  . Sulfa Antibiotics Nausea And Vomiting    Home Medications:  Medications Prior to Admission  Medication Dose Route Frequency Provider Last Rate Last Dose  . acetaminophen (TYLENOL) tablet 650 mg  650 mg Oral Q4H PRN Carolee Rota, PA      . ALPRAZolam Prudy Feeler) tablet 1 mg  1 mg Oral QID Carolee Rota, PA   1 mg at 08/22/11 1837  . alum & mag hydroxide-simeth (MAALOX/MYLANTA) 200-200-20 MG/5ML suspension 30 mL  30 mL Oral PRN Carolee Rota, PA      . aspirin EC tablet 325 mg  325 mg Oral Daily Carolee Rota, [place]      . atenolol (TENORMIN) tablet 50 mg  50 mg Oral Daily Carolee Rota, [place]      . ibuprofen (ADVIL,MOTRIN) tablet 600 mg  600 mg Oral Q8H PRN Carolee Rota, PA      . LORazepam (ATIVAN) tablet 1 mg  1 mg Oral Q8H PRN Carolee Rota, PA      . nicotine (NICODERM CQ - dosed in mg/24 hours) patch 21 mg  21 mg Transdermal Daily Carolee Rota, [place]      . ondansetron (ZOFRAN) tablet 4 mg  4 mg Oral Q8H PRN Carolee Rota, PA       Medications Prior to Admission  Medication Sig Dispense Refill  . ALPRAZolam (XANAX) 1 MG tablet Take 1 mg by mouth 4 (four) times daily.      Marland Kitchen aspirin EC 325 MG tablet Take 325 mg by mouth daily.      Marland Kitchen atenolol (TENORMIN) 50 MG tablet Take 50 mg by mouth daily.      Marland Kitchen ibuprofen (ADVIL,MOTRIN) 200 MG tablet Take 400 mg by mouth every 6 (six) hours as needed. For pain        OB/GYN Status:  No LMP for male patient.  General Assessment Data Location of Assessment: [facility] ED ACT Assessment: Yes Living Arrangements: Alone Can pt return to [name] living arrangement?: Yes Admission Status: Voluntary Is patient capable of signing voluntary admission?: Yes Transfer from: Acute Hospital Referral Source: Self/Family/Friend  Education Status Is patient currently in school?: No  Risk to self Suicidal Ideation: No Suicidal Intent: No Is patient at risk for suicide?: No Suicidal Plan?: No Access to Means: No What has been your use of drugs/alcohol within the last 12 months?: none Previous Attempts/Gestures: No How many times?: 0  Other Self Harm Risks: none Triggers for Past Attempts: None known Intentional Self Injurious Behavior: None Family Suicide History: No Recent stressful life event(s): Divorce Persecutory voices/beliefs?: No Depression: Yes Depression Symptoms: Loss of interest in usual pleasures Substance abuse history and/or treatment for substance abuse?:  No Suicide prevention information given to non-admitted patients: Not applicable  Risk to Others Homicidal Ideation: No Thoughts of Harm to Others: No [name] Homicidal Intent: No [name] Homicidal Plan: No Access to Homicidal Means: No Identified Victim: none History of harm to others?: No Assessment of Violence: None Noted Violent Behavior Description: none Does patient have access to weapons?: No Criminal Charges Pending?: No Does patient have a court date: No  Psychosis Hallucinations: None noted Delusions: None noted  Mental Status Report Appear/Hygiene: Disheveled Eye Contact: Good Motor Activity: Unremarkable Speech: Logical/coherent Level of Consciousness: Alert Mood: Anxious Affect: Anxious Anxiety Level: Severe Thought Processes: Coherent;Relevant Judgement: Unimpaired Orientation: Person;Place;Time;Situation Obsessive Compulsive Thoughts/Behaviors: Moderate  Cognitive Functioning Concentration: Normal Memory: Recent Intact;Remote Intact IQ: Average Insight: Fair Impulse Control: Fair Appetite: Good Weight Loss: 0  Weight Gain: 0  Sleep: No Change Vegetative Symptoms: None  Prior Inpatient Therapy Prior Inpatient Therapy: No  Prior Outpatient Therapy Prior Outpatient Therapy: Yes Prior Therapy Dates: ongoing Prior Therapy Facilty/Provider(s): [facility] Reason for Treatment: anxiety  ADL Screening (condition at time of admission) Patient's cognitive ability adequate to safely complete daily activities?: Yes Patient able to express need for assistance with ADLs?: Yes Independently performs ADLs?: Yes       Abuse/Neglect Assessment (Assessment to be complete while patient is alone) Physical Abuse: Denies Verbal Abuse: Denies Sexual Abuse: Denies Exploitation of patient/patient's resources: Denies Self-Neglect: Denies Values / Beliefs Cultural Requests During Hospitalization: None   Advance Directives (For Healthcare) Advance  Directive: Patient does not have advance directive Nutrition Screen Diet: Regular Unintentional weight loss greater than 10lbs within the last month: No Dysphagia: No Home Tube Feeding or Total Parenteral Nutrition (TPN): No Patient appears severely malnourished: No Pregnant or Lactating: No  Additional Information 1:1 In Past 12 Months?: No CIRT Risk: No Elopement Risk: No Does patient have medical clearance?: Yes     Disposition:  Disposition Disposition of Patient: Outpatient treatment Type of inpatient treatment program: Adult Pt is being given referrals for outpatient psychiatrists.  On Site Evaluation by:   Reviewed with Physician:     Marjean Donna 08/22/2011 7:22 PM

## 2011-08-22 NOTE — Discharge Instructions (Signed)
Followup with outpatient referrals given to you tonight.  Return to the emergency department with worsening symptoms or other concerns.

## 2011-08-22 NOTE — ED Provider Notes (Signed)
History     CSN: [id number]  Arrival date & time 08/22/11  1653   First MD Initiated Contact with Patient 08/22/11 1728      Chief Complaint  Patient presents with  . Anxiety    (Consider location/radiation/quality/duration/timing/severity/associated sxs/prior treatment) HPI Comments: Patient with history of anxiety, on Xanax 4 times a day. Patient states he's been through a recent divorce and has had worsening stressors and his anxiety is not well controlled. He is requesting to talk to a counselor. Patient denies homicidal or suicidal ideation. He denies current medical complaints. Patient has been on an antidepressant in the past but not in the past 4 years.  Patient is a 56 y.o. male presenting with anxiety. The history is provided by the patient.  Anxiety This is a chronic problem. The problem has been gradually worsening. Pertinent negatives include no abdominal pain, chest pain, chills, congestion, coughing, fever, headaches, myalgias, nausea, rash, sore throat or vomiting. The symptoms are aggravated by nothing. Treatments tried: Xanax.    Past Medical History  Diagnosis Date  . Anxiety   . Hypertension   . Depression     Past Surgical History  Procedure Date  . Tonsillectomy     History reviewed. No pertinent family history.  History  Substance Use Topics  . Smoking status: Current Everyday Smoker -- 0.5 packs/day  . Smokeless tobacco: Not on file  . Alcohol Use: No      Review of Systems  Constitutional: Negative for fever and chills.  HENT: Negative for congestion, sore throat and rhinorrhea.   Eyes: Negative for redness.  Respiratory: Negative for cough.   Cardiovascular: Negative for chest pain.  Gastrointestinal: Negative for nausea, vomiting, abdominal pain and diarrhea.  Genitourinary: Negative for dysuria.  Musculoskeletal: Negative for myalgias.  Skin: Negative for rash.  Neurological: Negative for headaches.  Psychiatric/Behavioral:       +  for anxiety    Allergies  Demerol; Erythromycin; and Sulfa antibiotics  Home Medications   Current Outpatient Rx  Name Route Sig Dispense Refill  . ALPRAZOLAM 1 MG PO TABS Oral Take 1 mg by mouth 4 (four) times daily.    . ASPIRIN EC 325 MG PO TBEC Oral Take 325 mg by mouth daily.    . ATENOLOL 50 MG PO TABS Oral Take 50 mg by mouth daily.    . IBUPROFEN 200 MG PO TABS Oral Take 400 mg by mouth every 6 (six) hours as needed. For pain      BP 118/61  Pulse 63  Temp(Src) 98.7 F (37.1 C) (Oral)  Resp 16  SpO2 100%  Physical Exam  Nursing note and vitals reviewed. Constitutional: He is oriented to person, place, and time. He appears well-developed and well-nourished.  HENT:  Head: Normocephalic and atraumatic.  Eyes: Conjunctivae are normal. Right eye exhibits no discharge. Left eye exhibits no discharge.  Neck: Normal range of motion. Neck supple.  Cardiovascular: Normal rate, regular rhythm and normal heart sounds.   Pulmonary/Chest: Effort normal and breath sounds normal.  Abdominal: Soft. There is no tenderness.  Neurological: He is alert and oriented to person, place, and time.  Skin: Skin is warm and dry.  Psychiatric: He has a normal mood and affect.    ED Course  Procedures (including critical care time)  Labs Reviewed  COMPREHENSIVE METABOLIC PANEL - Abnormal; Notable for the following:    Sodium 134 (*)    All other components within normal limits  URINE RAPID DRUG SCREEN (HOSP  PERFORMED) - Abnormal; Notable for the following:    Benzodiazepines POSITIVE (*)    All other components within normal limits  ETHANOL  CBC   No results found.   1. Anxiety     5:48 PM Patient seen and examined. Work-up initiated. Will call ACT when results return.   Vital signs reviewed and are as follows: Filed Vitals:   08/22/11 1705  BP: 118/61  Pulse: 63  Temp: 98.7 F (37.1 C)  Resp: 16   6:29 PM Spoke with ACT who will see patient. Patient is medically  cleared.  9:04 PM ACT has seen and given referrals. Pt does not meet any criteria for inpatient treatment. Will d/c to home.    MDM  Anxiety, no SI/HI.         Carolee Rota, PA 08/22/11 1830  Carolee Rota, [place] 08/22/11 2105

## 2011-08-22 NOTE — ED Notes (Signed)
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

## 2011-08-22 NOTE — ED Notes (Signed)
Pt wants to speak with a counselor but not sure if he needs inpatient or outpatient

## 2011-08-24 ENCOUNTER — Ambulatory Visit (HOSPITAL_COMMUNITY)
Admission: RE | Admit: 2011-08-24 | Discharge: 2011-08-24 | Disposition: A | Discharge status: Home / Self Care | Payer: BC Managed Care – PPO | Attending: Psychiatry | Admitting: Psychiatry

## 2011-08-24 DIAGNOSIS — F411 Generalized anxiety disorder: Secondary | ICD-10-CM | POA: Insufficient documentation

## 2011-08-24 NOTE — BH Assessment (Signed)
Assessment Note   Roger [name] is an 56 y.o. male. Pt presented to [facility] complaining of anxiety that causes him to continue to reexamine his purpose, such as "who am I? Why am I here?Marland Kitchen What makes me think, Will I become suicidal or something and not know what I am doing? Etc" He reports being divorced x 3 years and loosing everything. He reports his racing thoughts consume him on his job and he is unable to concentrate.the patient denies s/i, h/i and is not psychotic. He reports he sees [place] M at [facility].  He reports having anxiety and taking 1 mg Xanax qid x 4 yrs. He seems to think he needs to be on depression medication. Explained How he could best benefit from  the Psych-iop program. Pt reports he can start tomorrow. Called Jeri Modena who agreed for pt to start tomorrow at 9:00 am. Provided pt with a completed form indicating pt's was assessed today and the disposition for him to take to his job.  Later received a call from Pt's job to verify the authenticity of the form. 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          Axis I: Anxiety Disorder NOS Axis II: Deferred Axis III:  Past Medical History  Diagnosis Date  . Anxiety   . Hypertension   . Depression    Axis IV: other psychosocial or environmental problems Axis V: 51-60 moderate symptoms        Past Medical History:  Past Medical History  Diagnosis Date  . Anxiety   . Hypertension   . Depression     Past Surgical History  Procedure Date  . Tonsillectomy     Family History: No family history on file.  Social History:  reports that he has been smoking.  He does not have any smokeless tobacco history on file. He reports that he does not drink alcohol or use illicit drugs.  Additional Social History:    Allergies:  Allergies  Allergen Reactions  . Demerol Other (See Comments)    Heart beats fast  . Erythromycin Nausea And Vomiting  . Sulfa Antibiotics Nausea And Vomiting    Home  Medications:  Medications Prior to Admission  Medication Sig Dispense Refill  . ALPRAZolam (XANAX) 1 MG tablet Take 1 mg by mouth 4 (four) times daily.      Marland Kitchen aspirin EC 325 MG tablet Take 325 mg by mouth daily.      Marland Kitchen atenolol (TENORMIN) 50 MG tablet Take 50 mg by mouth daily.      Marland Kitchen ibuprofen (ADVIL,MOTRIN) 200 MG tablet Take 400 mg by mouth every 6 (six) hours as needed. For pain       Medications Prior to Admission  Medication Dose Route Frequency Provider Last Rate Last Dose  . DISCONTD: acetaminophen (TYLENOL) tablet 650 mg  650 mg Oral Q4H PRN Carolee Rota, PA      . DISCONTD: ALPRAZolam Prudy Feeler) tablet 1 mg  1 mg Oral QID Carolee Rota, PA   1 mg at 08/22/11 1837  . DISCONTD: alum & mag hydroxide-simeth (MAALOX/MYLANTA) 200-200-20 MG/5ML suspension 30 mL  30 mL Oral PRN Carolee Rota, PA      . DISCONTD: aspirin EC tablet 325 mg  325 mg Oral Daily Carolee Rota, [place]      . DISCONTD: atenolol (TENORMIN) tablet 50 mg  50 mg Oral Daily Carolee Rota, [place]      . DISCONTD:  ibuprofen (ADVIL,MOTRIN) tablet 600 mg  600 mg Oral Q8H PRN Carolee Rota, PA      . DISCONTD: LORazepam (ATIVAN) tablet 1 mg  1 mg Oral Q8H PRN Carolee Rota, PA      . DISCONTD: nicotine (NICODERM CQ - dosed in mg/24 hours) patch 21 mg  21 mg Transdermal Daily Carolee Rota, [place]      . DISCONTD: ondansetron (ZOFRAN) tablet 4 mg  4 mg Oral Q8H PRN Carolee Rota, PA        OB/GYN Status:  No LMP for male patient.  General Assessment Data Location of Assessment: [facility] Services ACT Assessment: Yes Living Arrangements: Alone Can pt return to current living arrangement?: Yes Admission Status: Voluntary Is patient capable of signing voluntary admission?: Yes Transfer from: Home Referral Source: Self/Family/Friend     Risk to self Suicidal Ideation: No Suicidal Intent: No Is patient at risk for suicide?: No Suicidal Plan?: No-Not Currently/Within Last 6 Months Access to Means: No What  has been your use of drugs/alcohol within the last 12 months?: none Previous Attempts/Gestures: No How many times?: 0  Other Self Harm Risks: no Triggers for Past Attempts: None known Intentional Self Injurious Behavior: None Family Suicide History: No Recent stressful life event(s): Divorce (3 yrs ago lost everything) Persecutory voices/beliefs?: No Depression: Yes Depression Symptoms: Loss of interest in usual pleasures;Feeling worthless/self pity;Isolating Substance abuse history and/or treatment for substance abuse?: No Suicide prevention information given to non-admitted patients: Yes  Risk to Others Homicidal Ideation: No Thoughts of Harm to Others: No Current Homicidal Intent: No Current Homicidal Plan: No Access to Homicidal Means: No History of harm to others?: No Assessment of Violence: None Noted Violent Behavior Description: none Does patient have access to weapons?: No Criminal Charges Pending?: No Does patient have a court date: No  Psychosis Hallucinations: None noted Delusions: None noted  Mental Status Report Appear/Hygiene: Improved Eye Contact: Good Motor Activity: Freedom of movement Speech: Logical/coherent Level of Consciousness: Alert Mood: Depressed;Anxious Affect: Anxious Anxiety Level: Minimal Thought Processes: Coherent;Relevant Judgement: Unimpaired Orientation: Person;Place;Time;Situation Obsessive Compulsive Thoughts/Behaviors: Minimal  Cognitive Functioning Concentration: Normal Memory: Recent Intact;Remote Intact IQ: Average Insight: Good Impulse Control: Good Appetite: Good Sleep: No Change Total Hours of Sleep: 8  Vegetative Symptoms: None  Prior Inpatient Therapy Prior Inpatient Therapy: No  Prior Outpatient Therapy Prior Outpatient Therapy: Yes Prior Therapy Dates: ongoing Prior Therapy Facilty/Provider(s): [facility] Reason for Treatment: anxiety                     Additional Information 1:1 In Past 12  Months?: No CIRT Risk: No Elopement Risk: No Does patient have medical clearance?: No     Disposition:  Disposition Disposition of Patient: Outpatient treatment Type of outpatient treatment: Psych Intensive Outpatient  On Site Evaluation by:   Reviewed with Physician:     Hattie Perch Winford 08/24/2011 3:10 PM

## 2011-08-25 ENCOUNTER — Encounter (HOSPITAL_COMMUNITY): Payer: Self-pay

## 2011-08-25 ENCOUNTER — Other Ambulatory Visit (HOSPITAL_COMMUNITY): Payer: BC Managed Care – PPO | Attending: Psychiatry

## 2011-08-25 DIAGNOSIS — F411 Generalized anxiety disorder: Secondary | ICD-10-CM | POA: Insufficient documentation

## 2011-08-25 DIAGNOSIS — F331 Major depressive disorder, recurrent, moderate: Secondary | ICD-10-CM | POA: Insufficient documentation

## 2011-08-25 NOTE — Progress Notes (Signed)
    Daily Group Progress Note  Program: IOP  Group Time: 9:00-10:30 am   Participation Level: Active  Behavioral Response: Appropriate  Type of Therapy:  Process Group  Summary of Progress: Today was patients first day in the group. He presented himself as "brother Delante" and related back to religion varies times, which caused some group members to report feeling "uncomfortable". Discussed boundaries around discussing religion in group and patient seemed receptive, however he still appeared to set himself apart from the group.     Group Time: 10:30 am - 12:00 pm   Participation Level:  Active  Behavioral Response: Appropriate  Type of Therapy: Psycho-education Group  Summary of Progress: Patient participated in a follow up discussion on Depression Triggers and shared personal examples from their homework assignment. Patient discussed ways to combat these triggers and received ideas from other group members.   Maxcine Ham, MSW, LCSW

## 2011-08-25 NOTE — Progress Notes (Unsigned)
Patient ID: Roger [name], male   DOB: [birth year], 56 y.o.   MRN: [id number] D:  This is a 28 dwm referred per therapist Marchelle Folks M.), treatment for anxiety and depression.  Reports having two recent trips to ED due to his anxiety.  Triggers/Stressors:  1) Separated three years from estranged wife.  States he lost everything.  Moved in with his sister to assist with taking care of his brother-in-law (PTSD), who later died.  Divorce was finalized summer 2012.  2) Pt continues to reside with his sister.  They moved into an apartment. Childhood:  "Had the greatest mother."  States he was the youngest and was very close to his mother.  [place] farm.  Father owned a [employer] business and he was an absent father.  According to pt, he looked up to his older brother, like a father.  States father was a binge drinker.  Pt denied any abuse. Siblings:  4 brothers and 2 sisters.  States support system includes his oldest brother along with the sister he resides with. Denies any drugs/ETOH.  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  Pt completed all forms.  Scored 25 on the burns.  A:  Oriented pt.  Provided pt with an orientation folder.  Informed Forde Radon ? (therapist) of admit.  Will encourage support group attendance.  R:  Pt receptive.

## 2011-08-26 ENCOUNTER — Other Ambulatory Visit (HOSPITAL_COMMUNITY): Payer: BC Managed Care – PPO

## 2011-08-26 MED ORDER — MIRTAZAPINE 15 MG PO TABS: 15.0000 mg | ORAL_TABLET | Freq: Every day | ORAL | Status: DC

## 2011-08-26 NOTE — Progress Notes (Signed)
    Daily Group Progress Note  Program: IOP  Group Time: 9:00-10:30 am   Participation Level: Active  Behavioral Response: Appropriate  Type of Therapy:  Process Group  Summary of Progress: Patient reports having depression and questioning his self worth following the end of his last marriage. Patient states he is finding it helpful to be in a group with others and to "not feel alone" in his depression. Patient also presents with anxious affect and needs to be redirected from over talking and over processing.       Group Time: 10:30 am - 12:00 pm   Participation Level:  Active  Behavioral Response: Appropriate  Type of Therapy: Psycho-education Group  Summary of Progress: Patient participated in an education segment on how to plan daily to mange mental health symptoms. Patient began creating their own personalized list of must do items and discussed the importance of taking control of mental health symptoms.  Maxcine Ham, MSW, LCSW

## 2011-08-28 ENCOUNTER — Encounter (HOSPITAL_COMMUNITY): Payer: Self-pay | Admitting: *Deleted

## 2011-08-28 ENCOUNTER — Inpatient Hospital Stay (HOSPITAL_COMMUNITY): Admission: AD | Admit: 2011-08-28 | Payer: BC Managed Care – PPO | Source: Ambulatory Visit | Admitting: Psychiatry

## 2011-08-28 ENCOUNTER — Emergency Department (HOSPITAL_COMMUNITY)
Admission: EM | Admit: 2011-08-28 | Discharge: 2011-08-28 | Disposition: A | Discharge status: Home / Self Care | Payer: BC Managed Care – PPO | Attending: Emergency Medicine | Admitting: Emergency Medicine

## 2011-08-28 DIAGNOSIS — F329 Major depressive disorder, single episode, unspecified: Secondary | ICD-10-CM | POA: Insufficient documentation

## 2011-08-28 DIAGNOSIS — F411 Generalized anxiety disorder: Secondary | ICD-10-CM | POA: Insufficient documentation

## 2011-08-28 DIAGNOSIS — F3289 Other specified depressive episodes: Secondary | ICD-10-CM | POA: Insufficient documentation

## 2011-08-28 DIAGNOSIS — F419 Anxiety disorder, unspecified: Secondary | ICD-10-CM

## 2011-08-28 DIAGNOSIS — I10 Essential (primary) hypertension: Secondary | ICD-10-CM | POA: Insufficient documentation

## 2011-08-28 LAB — CBC
HCT: 45.2 % (ref 39.0–52.0)
Hemoglobin: 16.5 g/dL (ref 13.0–17.0)
MCHC: 36.5 g/dL — ABNORMAL HIGH (ref 30.0–36.0)
MCV: 90.6 fL (ref 78.0–100.0)
RDW: 12.1 % (ref 11.5–15.5)
WBC: 9.5 10*3/uL (ref 4.0–10.5)

## 2011-08-28 LAB — RAPID URINE DRUG SCREEN, HOSP PERFORMED
Amphetamines: NOT DETECTED
Opiates: NOT DETECTED

## 2011-08-28 LAB — COMPREHENSIVE METABOLIC PANEL
AST: 15 U/L (ref 0–37)
BUN: 6 mg/dL (ref 6–23)
CO2: 27 mEq/L (ref 19–32)
Calcium: 9.6 mg/dL (ref 8.4–10.5)
Chloride: 100 mEq/L (ref 96–112)
Creatinine, Ser: 0.73 mg/dL (ref 0.50–1.35)
GFR calc Af Amer: >90 mL/min (ref >90)
GFR calc non Af Amer: >90 mL/min (ref >90)
Total Bilirubin: 0.3 mg/dL (ref 0.3–1.2)

## 2011-08-28 LAB — ACETAMINOPHEN LEVEL: Acetaminophen (Tylenol), Serum: <15 ug/mL (ref 10–30)

## 2011-08-28 LAB — DIFFERENTIAL
Basophils Absolute: 0.1 10*3/uL (ref 0.0–0.1)
Eosinophils Relative: 2 % (ref 0–5)
Lymphocytes Relative: 31 % (ref 12–46)
Monocytes Absolute: 0.6 10*3/uL (ref 0.1–1.0)
Monocytes Relative: 7 % (ref 3–12)

## 2011-08-28 LAB — ETHANOL: Alcohol, Ethyl (B): <11 mg/dL (ref 0–11)

## 2011-08-28 MED ORDER — MIRTAZAPINE 30 MG PO TABS: 15.0000 mg | ORAL_TABLET | Freq: Every day | ORAL | Status: DC

## 2011-08-28 MED ORDER — ATENOLOL 50 MG PO TABS
50.0000 mg | ORAL_TABLET | Freq: Every day | ORAL | Status: DC
  Filled 2011-08-28 (×2): qty 1

## 2011-08-28 MED ORDER — ALPRAZOLAM 1 MG PO TABS
1.0000 mg | ORAL_TABLET | Freq: Four times a day (QID) | ORAL | Status: DC
  Administered 2011-08-28: 1 mg via ORAL
  Filled 2011-08-28 (×2): qty 1

## 2011-08-28 MED ORDER — ASPIRIN EC 325 MG PO TBEC
325.0000 mg | DELAYED_RELEASE_TABLET | Freq: Every day | ORAL | Status: DC
Start: 2011-08-28 — End: 2011-08-29
  Filled 2011-08-28 (×2): qty 1

## 2011-08-28 MED ORDER — LORAZEPAM 1 MG PO TABS
1.0000 mg | ORAL_TABLET | Freq: Once | ORAL | Status: DC
  Filled 2011-08-28: qty 1

## 2011-08-28 NOTE — ED Notes (Signed)
Pt brought to unit by Clarisse Gouge, RN, she took 2 personal belongings bags to locker #37 to lock up for the pt.

## 2011-08-28 NOTE — Discharge Instructions (Signed)
Anxiety and Panic Attacks Your caregiver has informed you that you are having an anxiety or panic attack. There may be many forms of this. Most of the time these attacks come suddenly and without warning. They come at any time of day, including periods of sleep, and at any time of life. They may be strong and unexplained. Although panic attacks are very scary, they are physically harmless. Sometimes the cause of your anxiety is not known. Anxiety is a protective mechanism of the body in its fight or flight mechanism. Most of these perceived danger situations are actually nonphysical situations (such as anxiety over losing a job). CAUSES  The causes of an anxiety or panic attack are many. Panic attacks may occur in otherwise healthy people given a certain set of circumstances. There may be a genetic cause for panic attacks. Some medications may also have anxiety as a side effect. SYMPTOMS  Some of the most common feelings are:  Intense terror.   Dizziness, feeling faint.   Hot and cold flashes.   Fear of going crazy.   Feelings that nothing is real.   Sweating.   Shaking.   Chest pain or a fast heartbeat (palpitations).   Smothering, choking sensations.   Feelings of impending doom and that death is near.   Tingling of extremities, this may be from over-breathing.   Altered reality (derealization).   Being detached from yourself (depersonalization).  Several symptoms can be present to make up anxiety or panic attacks. DIAGNOSIS  The evaluation by your caregiver will depend on the type of symptoms you are experiencing. The diagnosis of anxiety or panic attack is made when no physical illness can be determined to be a cause of the symptoms. TREATMENT  Treatment to prevent anxiety and panic attacks may include:  Avoidance of circumstances that cause anxiety.   Reassurance and relaxation.   Regular exercise.   Relaxation therapies, such as yoga.   Psychotherapy with a  psychiatrist or therapist.   Avoidance of caffeine, alcohol and illegal drugs.   Prescribed medication.  SEEK IMMEDIATE MEDICAL CARE IF:   You experience panic attack symptoms that are different than your usual symptoms.   You have any worsening or concerning symptoms.  Document Released: 06/20/2005 Document Revised: 03/02/2011 Document Reviewed: 10/22/2009 [facility] Patient Information 2012 [place], [place].Depression You have signs of depression. This is a common problem. It can occur at any age. It is often hard to recognize. People can suffer from depression and still have moments of enjoyment. Depression interferes with your basic ability to function in life. It upsets your relationships, sleep, eating, and work habits. CAUSES  Depression is believed to be caused by an imbalance in brain chemicals. It may be triggered by an unpleasant event. Relationship crises, a death in the family, financial worries, retirement, or other stressors are normal causes of depression. Depression may also start for no known reason. Other factors that may play a part include medical illnesses, some medicines, genetics, and alcohol or drug abuse. SYMPTOMS   Feeling unhappy or worthless.   Long-lasting (chronic) tiredness or worn-out feeling.   Self-destructive thoughts and actions.   Not being able to sleep or sleeping too much.   Eating more than usual or not eating at all.   Headaches or feeling anxious.   Trouble concentrating or making decisions.   Unexplained physical problems and substance abuse.  TREATMENT  Depression usually gets better with treatment. This can include:  Antidepressant medicines. It can take weeks before the  proper dose is achieved and benefits are reached.   Talking with a therapist, clergyperson, counselor, or friend. These people can help you gain insight into your problem and regain control of your life.   Eating a good diet.   Getting regular physical exercise,  such as walking for 30 minutes every day.   Not abusing alcohol or drugs.  Treating depression often takes 6 months or longer. This length of treatment is needed to keep symptoms from returning. Call your caregiver and arrange for follow-up care as suggested. SEEK IMMEDIATE MEDICAL CARE IF:   You start to have thoughts of hurting yourself or others.   Call your local emergency services (911 in [place]   Go to your local medical emergency department.   Call the National Suicide Prevention Lifeline: 1-800-273-TALK 571-044-4214).  Document Released: 06/20/2005 Document Revised: 03/02/2011 Document Reviewed: 11/20/2009 [facility] Patient Information 2012 [place], [place].

## 2011-08-28 NOTE — ED Notes (Signed)
Patient admitted to psych ED with c/o increased depression and anxiety recently that are not being relieved by prescribed medications. He denies SI but stated he wants to get help before he gets to that point. He reported numerous stressors including divorce and losses that are triggering his depression. Denies HI. Denies A/VH. Denies SA.  Stated he wants inpatient treatment. Unit policies reviewed. Pt verbalized understanding. Will cont to monitor.

## 2011-08-28 NOTE — ED Provider Notes (Signed)
History     CSN: [id number]  Arrival date & time 08/28/11  1336   First MD Initiated Contact with Patient 08/28/11 1402      Chief Complaint  Patient presents with  . Anxiety  . Depression    (Consider location/radiation/quality/duration/timing/severity/associated sxs/prior treatment) HPI  56 year old male with history of anxiety presenting with anxiety. Patient states he has a history of anxiety for the past 20 years and has been taking Xanax for it on a regular basis. He had a separation/divorce 3 years ago and has been having increased anxiety due to it. Patient states for the past several weeks he has noticed increase in frequency. He noticed that whenever he saw TV shows or movies  that involved in suicide, it makes him very anxious, however denies SI/HI again.  Patient denies self medicating with these alcohol recreational drug use. He denies any pain. Denies headache, fever, chest pain shortness of breath, abdominal pain, nausea, vomiting, dyspnea.   Pt was last seen in the ER less than a week ago for similar complaint.  Pt was medically cleared and was discharged after evaluation with ACT team.    Past Medical History  Diagnosis Date  . Anxiety   . Hypertension   . Depression     Past Surgical History  Procedure Date  . Tonsillectomy     Family History  Problem Relation Age of Onset  . Anxiety disorder Mother   . Depression Mother   . Anxiety disorder Sister   . Depression Sister   . Anxiety disorder Brother   . Depression Brother     History  Substance Use Topics  . Smoking status: Not on file  . Smokeless tobacco: Not on file  . Alcohol Use: No      Review of Systems  All other systems reviewed and are negative.    Allergies  Demerol; Erythromycin; and Sulfa antibiotics  Home Medications   Current Outpatient Rx  Name Route Sig Dispense Refill  . ALPRAZOLAM 1 MG PO TABS Oral Take 1 mg by mouth 4 (four) times daily.    . ASPIRIN EC 325 MG PO  TBEC Oral Take 325 mg by mouth daily.    . ATENOLOL 50 MG PO TABS Oral Take 50 mg by mouth daily.    . IBUPROFEN 200 MG PO TABS Oral Take 400 mg by mouth every 6 (six) hours as needed. For pain    . MIRTAZAPINE 15 MG PO TABS Oral Take 1 tablet (15 mg total) by mouth at bedtime. 30 tablet 0    depression    There were no vitals taken for this visit.  Physical Exam  Nursing note and vitals reviewed. Constitutional: He appears well-developed and well-nourished. No distress.       Awake, alert, nontoxic appearance  HENT:  Head: Atraumatic.  Eyes: Conjunctivae are normal. Right eye exhibits no discharge. Left eye exhibits no discharge.  Neck: Normal range of motion. Neck supple.  Cardiovascular: Normal rate and regular rhythm.   Pulmonary/Chest: Effort normal. No respiratory distress. He exhibits no tenderness.  Abdominal: Soft. There is no tenderness. There is no rebound.  Musculoskeletal: Normal range of motion. He exhibits no tenderness.       ROM appears intact, no obvious focal weakness  Neurological: He is alert.  Skin: Skin is warm and dry. No rash noted.  Psychiatric: He has a normal mood and affect.    ED Course  Procedures (including critical care time)  Labs Reviewed - No  data to display No results found.   No diagnosis found.  Results for orders placed during the hospital encounter of 08/28/11  CBC      Component Value Range   WBC 9.5  4.0 - 10.5 (K/uL)   RBC 4.99  4.22 - 5.81 (MIL/uL)   Hemoglobin 16.5  13.0 - 17.0 (g/dL)   HCT 14.7  82.9 - 56.2 (%)   MCV 90.6  78.0 - 100.0 (fL)   MCH 33.1  26.0 - 34.0 (pg)   MCHC 36.5 (*) 30.0 - 36.0 (g/dL)   RDW 13.0  86.5 - 78.4 (%)   Platelets 223  150 - 400 (K/uL)  DIFFERENTIAL      Component Value Range   Neutrophils Relative 59  43 - 77 (%)   Neutro Abs 5.6  1.7 - 7.7 (K/uL)   Lymphocytes Relative 31  12 - 46 (%)   Lymphs Abs 3.0  0.7 - 4.0 (K/uL)   Monocytes Relative 7  3 - 12 (%)   Monocytes Absolute 0.6  0.1 -  1.0 (K/uL)   Eosinophils Relative 2  0 - 5 (%)   Eosinophils Absolute 0.2  0.0 - 0.7 (K/uL)   Basophils Relative 1  0 - 1 (%)   Basophils Absolute 0.1  0.0 - 0.1 (K/uL)  URINE RAPID DRUG SCREEN (HOSP PERFORMED)      Component Value Range   Opiates NONE DETECTED  NONE DETECTED    Cocaine NONE DETECTED  NONE DETECTED    Benzodiazepines PENDING  NONE DETECTED    Amphetamines NONE DETECTED  NONE DETECTED    Tetrahydrocannabinol NONE DETECTED  NONE DETECTED    Barbiturates NONE DETECTED  NONE DETECTED    Dg Lumbar Spine Complete  08/10/2011  *RADIOLOGY REPORT*  Clinical Data: Low back pain radiating into left lower extremity.  LUMBAR SPINE - COMPLETE 4+ VIEW  Comparison: 06/10/2011  Findings: Lumbar vertebral bodies show normal alignment.  Stable mild proliferative changes are seen involving the endplates at multiple levels.  There is no significant loss of disc space height at any level.  No fracture or subluxation identified.  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  No acute findings.  Original Report Authenticated By: Reola Calkins, M.D.      MDM  Pt is requesting for further evaluation by psych for worsening anxiety.  He is of no threat to himself or anyone else.  He is medically cleared.  I have discussed care with my attending and with ACT team.  Pt is to be evaluated by ACT for further care.          Fayrene Helper, PA-C 08/28/11 [phone]

## 2011-08-28 NOTE — ED Provider Notes (Addendum)
Patient was accepted at behavioral health. Orders were placed for transfer. Patient was accepted by Dr. Orson Aloe.  9:06 PM Patient now states that he has absolutely no suicidal or homicidal ideation. He would like to be discharged. We will turn down his bed requests to behavioral health. Patient has appointment tomorrow morning with his therapist at 9 AM. He's felt safe for discharge at this time. He was discharged in good condition.  Cyndra Numbers, MD 08/28/11 2008  Cyndra Numbers, MD 08/28/11 2107

## 2011-08-28 NOTE — ED Notes (Signed)
Security called to wand pt and pt's personal belongings. 

## 2011-08-28 NOTE — ED Notes (Signed)
Pt c/o worsening feelings of fear, anxiety and depression since yesterday, has been treated by PCP and is on meds for same but was told if feelings got worse to come to ED for further evaluation. Pt denies SI/HI.

## 2011-08-28 NOTE — ED Notes (Signed)
Pt

## 2011-08-28 NOTE — ED Notes (Signed)
Pt reports that he is here for medical clearance for [facility] placement, blue scrubs given.

## 2011-08-28 NOTE — BH Assessment (Signed)
Assessment Note   Roger [name] is an 56 y.o. male.   Pt is highly anxious and depressed.  Pt verbalizes plan to end life if discharged.  Pt tearful and requesting help.  Pt was assessed on 08-24-11 and placed in IOP but pt reports that program is not helpful.  Pt is seen by a therapist, Marchelle Folks, at [facility] for 2 years but "she says nothing is wrong with me."  Pt was evaluated on 08-26-11 by Dr. Zetta Bills who is a psychiatrist and she prescribed Remeron for pt and gave him a d/o of Depression.  Pt has not taken the new med.  Pt affect is blunted and his speech is slow and delayed.  Pt asked ACT same questions repeatedly (4 different times) about whether he was like other patients... "have you seen someone as bad as me" .... "If I leave here I just don't know what I will do but I will hurt myself"...  Pt has been on Xanax for 25 years and takes 1 mg 4x daily.  Pt reports Dr. Zetta Bills indicated he cannot come off this med for a year and it needs to be slowly.  Pt worried that [facility] will not continue med if he is admitted.  ACT informed pt that this will be discussed prior to him agreeing to be admitted to [facility] if the MD there accepts him.  Pt is cooperative but there is noted processing issues that needs to be examined.  Pt appears to understand and communicate well but appears to get stuck in his thoughts and repeat questions over as if never hearing the answer earlier.  When asked about this issue pt just looks blank and does not respond.    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 Anxiety   . Hypertension   . Depression    Axis IV: economic problems, other psychosocial or environmental problems, problems related to social environment and problems with primary support group Axis V: 31-40 impairment in reality testing  Past Medical History:  Past Medical History    Diagnosis Date  . Anxiety   . Hypertension   . Depression     Past Surgical History  Procedure Date  . Tonsillectomy     Family History:  Family History  Problem Relation Age of Onset  . Anxiety disorder Mother   . Depression Mother   . Anxiety disorder Sister   . Depression Sister   . Anxiety disorder Brother   . Depression Brother     Social History:  reports that he has been smoking Cigarettes.  He has been smoking about .5 packs per day. He has never used smokeless tobacco. He reports that he does not drink alcohol or use illicit drugs.  Additional Social History:  Alcohol / Drug Use Pain Medications: none Prescriptions: none Over the Counter: none History of alcohol / drug use?: No history of alcohol / drug abuse Allergies:  Allergies  Allergen Reactions  . Demerol Other (See Comments)    Heart beats fast  . Erythromycin Nausea And Vomiting  . Sulfa Antibiotics Nausea And Vomiting    Home Medications:  Medications Prior to Admission  Medication Dose Route Frequency Provider Last Rate Last Dose  . ALPRAZolam Prudy Feeler) tablet 1 mg  1 mg Oral QID Fayrene Helper, PA-C   1 mg at 08/28/11 1825  . aspirin  EC tablet 325 mg  325 mg Oral Daily Fayrene Helper, PA-C      . atenolol (TENORMIN) tablet 50 mg  50 mg Oral Daily Fayrene Helper, PA-C      . LORazepam (ATIVAN) tablet 1 mg  1 mg Oral Once Fayrene Helper, PA-C      . mirtazapine (REMERON) tablet 15 mg  15 mg Oral QHS Fayrene Helper, PA-C       Medications Prior to Admission  Medication Sig Dispense Refill  . ALPRAZolam (XANAX) 1 MG tablet Take 1 mg by mouth 4 (four) times daily.      Marland Kitchen aspirin EC 325 MG tablet Take 325 mg by mouth daily.      Marland Kitchen atenolol (TENORMIN) 50 MG tablet Take 50 mg by mouth daily.      Marland Kitchen ibuprofen (ADVIL,MOTRIN) 200 MG tablet Take 200 mg by mouth every 6 (six) hours as needed. For pain      . mirtazapine (REMERON) 15 MG tablet Take 1 tablet (15 mg total) by mouth at bedtime.  30 tablet  0    OB/GYN Status:   No LMP for male patient.  General Assessment Data Location of Assessment: [facility] ED ACT Assessment: Yes Living Arrangements: Relatives Can pt return to current living arrangement?: Yes Admission Status: Voluntary Is patient capable of signing voluntary admission?: Yes Transfer from: Acute Hospital Referral Source: MD  Education Status Is patient currently in school?: No  Risk to self Suicidal Ideation: Yes-Currently Present Suicidal Intent: Yes-Currently Present Is patient at risk for suicide?: Yes Suicidal Plan?: Yes-Currently Present Specify Current Suicidal Plan: "I am not sure what I will do, but I will do something to end my life" Access to Means: Yes Specify Access to Suicidal Means: pt has ability to end his life What has been your use of drugs/alcohol within the last 12 months?: no Previous Attempts/Gestures: No How many times?: 0  Other Self Harm Risks: 0 Triggers for Past Attempts: None known Intentional Self Injurious Behavior: None Family Suicide History: No Recent stressful life event(s): Other (Comment) (lost wife, job and friends 3 yrs ago "I have nothing") Persecutory voices/beliefs?: No Depression: Yes Depression Symptoms: Tearfulness;Insomnia;Isolating;Fatigue;Guilt;Loss of interest in usual pleasures;Feeling worthless/self pity;Feeling angry/irritable Substance abuse history and/or treatment for substance abuse?: No Suicide prevention information given to non-admitted patients: Not applicable  Risk to Others Homicidal Ideation: No Thoughts of Harm to Others: No Current Homicidal Intent: No Current Homicidal Plan: No Access to Homicidal Means: No Identified Victim: no one History of harm to others?: No Assessment of Violence: None Noted Violent Behavior Description: cooperative and meek Does patient have access to weapons?: No Criminal Charges Pending?: No Does patient have a court date: No  Psychosis Hallucinations: None noted Delusions: None  noted  Mental Status Report Appear/Hygiene: Disheveled Eye Contact: Fair Motor Activity: Restlessness Speech: Logical/coherent Level of Consciousness: Alert Mood: Depressed;Anxious;Labile;Terrified;Worthless, low self-esteem;Despair;Ashamed/humiliated;Fearful;Empty;Sad Affect: Anxious;Depressed;Blunted;Sad Anxiety Level: Panic Attacks Panic attack frequency: hourly Most recent panic attack: 08-28-11 Thought Processes: Coherent;Tangential Judgement: Impaired Orientation: Person;Place;Time;Situation Obsessive Compulsive Thoughts/Behaviors: Severe  Cognitive Functioning Concentration: Decreased Memory: Recent Intact;Remote Intact IQ: Average Insight: Poor Impulse Control: Poor Appetite: Fair Weight Loss: 0  Weight Gain: 0  Sleep: Decreased Total Hours of Sleep: 3  Vegetative Symptoms: Decreased grooming  Prior Inpatient Therapy Prior Inpatient Therapy: No Prior Therapy Dates: current Prior Therapy Facilty/Provider(s): [facility] - Marchelle Folks Reason for Treatment: optx  Prior Outpatient Therapy Prior Outpatient Therapy: Yes Prior Therapy Dates: ongoing [facility] Reason for  Treatment: anxiety  ADL Screening (condition at time of admission) Patient's cognitive ability adequate to safely complete daily activities?: Yes Patient able to express need for assistance with ADLs?: Yes Independently performs ADLs?: Yes Communication: Independent Dressing (OT): Independent Grooming: Independent Feeding: Independent Bathing: Independent Toileting: Independent In/Out Bed: Independent Walks in Home: Independent Weakness of Legs: None Weakness of Arms/Hands: None  Home Assistive Devices/Equipment Home Assistive Devices/Equipment: None  Therapy Consults (therapy consults require a physician order) PT Evaluation Needed: No OT Evalulation Needed: No SLP Evaluation Needed: No Abuse/Neglect Assessment (Assessment to be complete while  patient is alone) Physical Abuse: Denies Verbal Abuse: Denies Sexual Abuse: Denies Exploitation of patient/patient's resources: Denies Self-Neglect: Denies Values / Beliefs Cultural Requests During Hospitalization: None Spiritual Requests During Hospitalization: None Consults Spiritual Care Consult Needed: No Social Work Consult Needed: No   Nutrition Screen Diet: Regular Unintentional weight loss greater than 10lbs within the last month: No Dysphagia: No Home Tube Feeding or Total Parenteral Nutrition (TPN): No Patient appears severely malnourished: No Pregnant or Lactating: No Dietitian Consult Needed: No  Additional Information 1:1 In Past 12 Months?: No CIRT Risk: No Elopement Risk: No Does patient have medical clearance?: Yes     Disposition:   Pt reports being depressed and suicidal with plan to end life but not specific.  Pt was assessed on 08-24-11 and agreed to participate in IOP at [facility].  Pt will be reviewed for a 500 hall bed at [facility] when it is available  Disposition Disposition of Patient: Inpatient treatment program Type of inpatient treatment program: Adult Type of outpatient treatment: Adult  On Site Evaluation by:   Reviewed with Physician:     Titus Mould, Eppie Gibson 08/28/2011 6:50 PM

## 2011-08-28 NOTE — ED Provider Notes (Signed)
Medical screening examination/treatment/procedure(s) were performed by non-physician practitioner and as supervising physician I was immediately available for consultation/collaboration.   Nat Christen, MD 08/28/11 [phone]

## 2011-08-29 ENCOUNTER — Other Ambulatory Visit (HOSPITAL_COMMUNITY): Payer: BC Managed Care – PPO

## 2011-08-29 NOTE — Progress Notes (Signed)
    Daily Group Progress Note  Program: IOP  Group Time: 9:00-10:30 am   Participation Level: Active  Behavioral Response: Appropriate  Type of Therapy:  Process Group  Summary of Progress: Patient presents with highly anxious affect and smells strongly of cigarette smoke. Patient states he tried to admit himself into the hospital for depression with suicidal thoughts over the weekend, but was not admitted due to contracting for safety. Patient states he feels "empy" and "worthless" following his breakup and how he lacks a feeling of purpose. Patient currently contracts for safety and denies suicidal thinking.      Group Time: 10:30 am - 12:00 pm   Participation Level:  Active  Behavioral Response: Appropriate  Type of Therapy: Psycho-education Group  Summary of Progress: patient discussed communication skills and learned about the five love languages and how to incorporate them into personal relationships.   Maxcine Ham, MSW, LCSW

## 2011-08-30 ENCOUNTER — Other Ambulatory Visit (HOSPITAL_COMMUNITY): Payer: BC Managed Care – PPO

## 2011-08-30 NOTE — Progress Notes (Signed)
    Daily Group Progress Note  Program: IOP  Group Time: 9:00-10:30 am   Participation Level: Active  Behavioral Response: Appropriate  Type of Therapy:  Process Group  Summary of Progress: Patient reports continued depression but presents with less anxious affect. Patient shared about his strained relationship with his father and his urge to please him by continuously trying to impress him and earn his approval. Patient related this to his recent marriage and began gaining insight into his tendency to give parts of himself up to make others happy which results in feelings of resentment. Patient is working on self forgiveness over his failed marriage.      Group Time: 10:30 am - 12:00 pm   Participation Level:  Active  Behavioral Response: Appropriate  Type of Therapy: Psycho-education Group  Summary of Progress: Patient participated in learning the DBT skill of mindfulness as an emotional management tool and how to live more actively in the present moment.   Maxcine Ham, MSW, LCSW

## 2011-08-31 ENCOUNTER — Other Ambulatory Visit (HOSPITAL_COMMUNITY): Payer: BC Managed Care – PPO

## 2011-08-31 NOTE — Progress Notes (Signed)
    Daily Group Progress Note  Program: IOP  Group Time: 9:00-10:30 am   Participation Level: Active  Behavioral Response: Appropriate  Type of Therapy:  Process Group  Summary of Progress: Patient appears less depressed and reports making progress with his self-esteem and forgiving himself for past mistakes. He is more open and less guarded.     Group Time: 10:30 am - 12:00 pm   Participation Level:  Active  Behavioral Response: Appropriate  Type of Therapy: Psycho-education Group  Summary of Progress: Patient learned tools of  healthy Boundary setting and ways to incorporate into their life to manage depression and anxiety.   Maxcine Ham, MSW, LCSW

## 2011-09-01 ENCOUNTER — Other Ambulatory Visit (HOSPITAL_COMMUNITY): Payer: BC Managed Care – PPO

## 2011-09-01 NOTE — Progress Notes (Signed)
    Daily Group Progress Note  Program: IOP  Group Time: 9:00-10:30 am   Participation Level: Active  Behavioral Response: Appropriate  Type of Therapy:  Process Group  Summary of Progress: Patient reports less depression and anxiety resulting in regular talking in the group and from exploring how he is hard on himself and needs to set better boundaries with others.      Group Time: 10:30 am - 12:00 pm   Participation Level:  Active  Behavioral Response: Appropriate  Type of Therapy: Psycho-education Group  Summary of Progress:  Patient participated in a follow up discussion on barriers that impact [place] setting healthy boundaries and discussed ways to challenge negative feelings associated with limit setting.     Maxcine Ham, MSW, LCSW

## 2011-09-02 ENCOUNTER — Other Ambulatory Visit (HOSPITAL_COMMUNITY): Payer: BC Managed Care – PPO | Attending: Psychiatry

## 2011-09-02 DIAGNOSIS — F411 Generalized anxiety disorder: Secondary | ICD-10-CM | POA: Insufficient documentation

## 2011-09-02 DIAGNOSIS — F331 Major depressive disorder, recurrent, moderate: Secondary | ICD-10-CM | POA: Insufficient documentation

## 2011-09-05 ENCOUNTER — Other Ambulatory Visit (HOSPITAL_COMMUNITY): Payer: BC Managed Care – PPO

## 2011-09-05 MED ORDER — ALPRAZOLAM 1 MG PO TABS: 1.0000 mg | ORAL_TABLET | Freq: Four times a day (QID) | ORAL | Status: DC

## 2011-09-05 NOTE — Progress Notes (Signed)
   Daily Group Progress Note  Program: IOP  Group Time: 0900 - 1030  Participation Level: Active  Behavioral Response: Appropriate, Sharing and Care-Taking  Type of Therapy:  Psycho-education Group  Summary of Progress:  Group working on issues of setting boundaries and being able to say "no" when appropriate.  We used specific personal examples and practiced with participants playing the roles of people to ask for something and those who try to say "no".  All group members were actively involved in the acting as well as the discussion.    Group Time: 1045 - 1200  Participation Level:  Active  Behavioral Response: Appropriate and Sharing  Type of Therapy: Process Group  Summary of Progress: Vernia Buff gave good feedback to others and was very supportive.  He had spent time on his issue during the morning group as we worked with him on how he might be more assertive.  Shonna Chock, APRN, CNS  [facility]

## 2011-09-05 NOTE — Progress Notes (Signed)
Patient ID: Roger [name], male   DOB: [birth year], 56 y.o.   MRN: [id number] Pt seen with Jeri Modena , anxiety seems to be improving continues to need reassurance re his anxiety. Discussed increasing his Remeron to 15 mgpo q hs also script given for his xanax for a 30 day supply. No si/ hi . No hallu/ delusions.

## 2011-09-06 ENCOUNTER — Other Ambulatory Visit (HOSPITAL_COMMUNITY): Payer: BC Managed Care – PPO

## 2011-09-06 NOTE — Progress Notes (Signed)
    Daily Group Progress Note  Program: IOP  Group Time: 9:00-10:30 am   Participation Level: Active  Behavioral Response: Appropriate  Type of Therapy:  Process Group  Summary of Progress: Patient presents with less depression and significantly less anxiety. Patient is identifying his tendency to choose to be in relationships with people who are "broken" emotionally and have been through significant trauma due to his desire to fix other peoples pain. Because of this he lacks healthy relationships and becomes drained with trying to fix everyone else's problems.      Group Time: 10:30 am - 12:00 pm   Participation Level:  Active  Behavioral Response: Appropriate  Type of Therapy: Psycho-education Group  Summary of Progress: Patient was introduced to progressive muscle relaxation as a way to decrease stress and promote healthy sleep. Patient practiced the technique and identified how they could use it going forward.  Maxcine Ham, MSW, LCSW

## 2011-09-07 ENCOUNTER — Other Ambulatory Visit (HOSPITAL_COMMUNITY): Payer: BC Managed Care – PPO

## 2011-09-07 NOTE — Progress Notes (Signed)
    Daily Group Progress Note  Program: IOP  Group Time: 9:00-10:30 am   Participation Level: Active  Behavioral Response: Appropriate  Type of Therapy:  Process Group  Summary of Progress: Patient was scheduled to discharge today, but requested additonal time due to fear of leaving the support network he has within the group which caused increased feelings of depression and anxiety. Patient identified ways he can have healthy relationships outside of the group and worked on addressing themes of "needing to be perfect" that stem from high expectations from his childhood. Patient expressed a need to not be alone anymore and a desire to get married again in the future, but is aware of his tendency to find "broken" woman and does not want to repeat this pattern.     Group Time: 10:30 am - 12:00 pm   Participation Level:  Active  Behavioral Response: Appropriate  Type of Therapy: Process Group  Summary of Progress: Patient participated in a grief and loss group facilitated by the chaplin and identified barriers impacting overcoming and accepting personal losses.  Maxcine Ham, MSW, LCSW

## 2011-09-07 NOTE — Progress Notes (Signed)
    Daily Group Progress Note  Program: IOP  Group Time: 9:00-10:30 am   Participation Level: Active  Behavioral Response: Appropriate  Type of Therapy:  Process Group  Summary of Progress: Patient identified depression triggers and ways to better manage depressive symptoms.          Group Time: 10:30 am - 12:00 pm   Participation Level:  Active  Behavioral Response: Appropriate  Type of Therapy: Process Group  Summary of Progress: Patient continued discussion on depression triggers and ways to better manage depressive symptoms.     Geddy Boydstun, MSW, LCSW  

## 2011-09-08 ENCOUNTER — Other Ambulatory Visit (HOSPITAL_COMMUNITY): Payer: BC Managed Care – PPO

## 2011-09-08 NOTE — Progress Notes (Signed)
    Daily Group Progress Note  Program: IOP  Group Time: 9:00-10:30 am  Participation Level: Active  Behavioral Response: Appropriate  Type of Therapy:  Process Group  Summary of Progress: Patient participated in a goodbye ceremony recognizing his last day in the group. He described significantly less depression and anxiety and increased feelings of self-worth. Patient identified themes of choosing unhealthy relationships and how to identify red flags going forward that would trigger him into getting into an unhealthy relationship. He praised the members of the group and his experience.      Group Time: 10:30 am - 12:00 pm   Participation Level:  Active  Behavioral Response: Appropriate  Type of Therapy: Psycho-education Group  Summary of Progress:  Patient discussed depression symptoms and barriers to wellness.   Maxcine Ham, MSW, LCSW

## 2011-09-08 NOTE — Progress Notes (Signed)
Patient ID: Roger [name], male   DOB: Jun 16, 1956, 56 y.o.   MRN: [id number]                                                                                                                                         Psychiatric initial assessment                                                                                                                                                  08-26-11  Chief complaint   depression and anxiety.    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 States he hurt his back 3 weeks ago in then started thinking very deep and philosophical thoughts about who he was and where was his wife going. and became very anxious and depressed. Patient states this scared him and so he came to IOP. Reports that his sleep is poor and he gets to sleep anywhere from 4-5 hours, he is tired in the morning, appetite is fair is very anxious feels hopeless and helpless denies suicidal or homicidal ideation with no hallucinations or delusions.   Denies smoking cigarettes alcohol or any other drugs.  Past psychiatrist history.  patient states that for 21 years he was on imipramine and Xanax for his depression and anxiety and about 4 years ago he discontinued the Tofranil but his Xanax was continued and he continues to take the Xanax. Patient used to see a psychiatrist Dr. Fabienne Bruns then Dr Robbie Louis . at [facility]. Was hospitalized on the psych unit to do psychological testing many years ago. He also saw a therapist Marchelle Folks [facility].  Past medical history---------------- hypertension, status post back injury. His PCP is Dr Armandina Stammer.  Allergies-----Demerol, erythromycin.  Current medications #1 Xanax 1 mg 4 times a day, #2 atenolol 50 mg every day, #3 aspirin 325 mg by mouth daily, #4  Advil 200 mg by mouth daily when necessary for pain.  Family history------------------ parents and all of his siblings have a history of anxiety. His father had problems with alcohol.   Social  history------ patient was born and raised in [place], elementary and middle school was okay he lived on the form and has been married 5 times. 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  Mental status examination.    Tall thin built male very anxious, affect was appropriate mood was anxious speech was mildly pressured and circumstantial. He needed constant reassurance that he would get better with treatment. No suicidal or homicidal ideation was present. No hallucinations or delusions were noted. His recent and remote memory was good, judgment and insight was good, concentration was fair recall was fair..  Diagnoses Axis I   Maj. depressive episode recurrent.                              Generalized anxiety disorder                  Axis II   obsessive-compulsive traits                 Axis III hypertension                 Axis IV problems with the primary support group and social environment                Axis V GAF 50  Treatment plan--------- patient will start IOP, he'll participate in individual and group therapy, discussed the rationale risks benefits options and side effects of Remeron and he has given me his informed consent and he'll start Remeron 7.5 mg by mouth each bedtime today. Margit Banda     08/26/11.

## 2011-09-08 NOTE — Progress Notes (Signed)
Patient ID: Roger [name], male   DOB: 06-12-1956, 56 y.o.   MRN: [id number] Discharge Note  Patient:  Roger [name] is an 56 y.o., male DOB:  06/27/1956  Date of Admission:  08-25-11 Date of Discharge:  09/08/11  Reason for Admission: Depression and anxiety Hospital Course:  Patient was admitted for depression that had gotten worse after he hurt his back 3 weeks prior to admission. Patient started worrying about the future and became very depressed and anxious and was admitted to IOP. He was started on Remeron 7.5 mg which was then increased to 15 mg at bedtime. He attended group and focused on developing coping skills for his depression and anxiety. Cognitive behavior therapy was done for his anxiety. Patient did very well and stabilized rapidly. His sleep and appetite were good mood was stable he had no suicidal or homicidal ideation and had no hallucinations or delusions.  Mental Status at Discharge: At the time of discharge------------------- patient was alert oriented x3, affect was bright mood was euthymic. Speech was normal no suicidal or homicidal ideation was present no hallucinations or delusions were noted. Recent and remote memory was good judgment and insight was good concentration and recall were good. He was coping well and tolerating his medications.  Lab Results: None Current outpatient prescriptions:ALPRAZolam (XANAX) 1 MG tablet, Take 1 tablet (1 mg total) by mouth 4 (four) times daily., Disp: 120 tablet, Rfl: 0;  aspirin EC 325 MG tablet, Take 325 mg by mouth daily., Disp: , Rfl: ;  atenolol (TENORMIN) 50 MG tablet, Take 50 mg by mouth daily., Disp: , Rfl: ;  ibuprofen (ADVIL,MOTRIN) 200 MG tablet, Take 200 mg by mouth every 6 (six) hours as needed. 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 Anxiety   . Hypertension   . Depression    Axis IV: problems related to social environment and problems with primary support group Axis V: 61-70 mild symptoms   Level of Care:  OP  Discharge destination:  Home  Is patient on multiple antipsychotic therapies at discharge:  No    Has Patient had three or more failed trials of antipsychotic monotherapy by history:  No  Patient phone:  [phone] (home)  Patient address:   [address] [place] [address],   Follow-up recommendations:  Activity:  As tolerated Diet:  Regular  Comments:  At the time of discharge patient was not suicidal or homicidal risk and was not psychotic  The patient received suicide prevention pamphlet:  Yes Belongings returned:  Valuables  Margit Banda 09/08/2011, 4:22 PM

## 2011-09-08 NOTE — Patient Instructions (Signed)
Patient completed MH-IOP today.  Will follow up with Jorje Guild, PA on 09-29-11 @ 3:30pm.. and [employer], LPC .  Pt denies no suicidal ideations.

## 2011-09-09 ENCOUNTER — Other Ambulatory Visit (HOSPITAL_COMMUNITY): Payer: BC Managed Care – PPO

## 2011-09-12 ENCOUNTER — Other Ambulatory Visit (HOSPITAL_COMMUNITY): Payer: BC Managed Care – PPO

## 2011-09-13 ENCOUNTER — Other Ambulatory Visit (HOSPITAL_COMMUNITY): Payer: BC Managed Care – PPO

## 2011-09-14 ENCOUNTER — Other Ambulatory Visit (HOSPITAL_COMMUNITY): Payer: BC Managed Care – PPO

## 2011-09-15 ENCOUNTER — Other Ambulatory Visit (HOSPITAL_COMMUNITY): Payer: BC Managed Care – PPO

## 2011-09-16 ENCOUNTER — Other Ambulatory Visit (HOSPITAL_COMMUNITY): Payer: BC Managed Care – PPO

## 2011-09-19 ENCOUNTER — Other Ambulatory Visit (HOSPITAL_COMMUNITY): Payer: BC Managed Care – PPO

## 2011-09-20 ENCOUNTER — Other Ambulatory Visit (HOSPITAL_COMMUNITY): Payer: BC Managed Care – PPO

## 2011-09-29 ENCOUNTER — Ambulatory Visit (INDEPENDENT_AMBULATORY_CARE_PROVIDER_SITE_OTHER): Payer: BC Managed Care – PPO | Admitting: Physician Assistant

## 2011-09-29 DIAGNOSIS — F331 Major depressive disorder, recurrent, moderate: Secondary | ICD-10-CM

## 2011-09-29 DIAGNOSIS — F330 Major depressive disorder, recurrent, mild: Secondary | ICD-10-CM

## 2011-09-29 DIAGNOSIS — F411 Generalized anxiety disorder: Secondary | ICD-10-CM

## 2011-09-29 MED ORDER — MIRTAZAPINE 15 MG PO TABS: ORAL_TABLET | ORAL | Status: DC

## 2011-09-29 MED ORDER — ALPRAZOLAM 1 MG PO TABS: 1.0000 mg | ORAL_TABLET | Freq: Four times a day (QID) | ORAL | Status: DC

## 2011-09-29 NOTE — Progress Notes (Signed)
Psychiatric Assessment Adult  Patient Identification:  Roger [name] Date of Evaluation:  09/29/2011 Chief Complaint: Anxiety and depression History of Chief Complaint:   Chief Complaint  Patient presents with  . Establish Care  . Anxiety  . Depression    HPI Guilherme was referred from the [facility] Intensive Outpatient Program where he was admitted for treatment of his depression and anxiety. He was admitted to that program on or about February 21 at 2013 after having presented at the emergency room on several occasions needing treatment for anxiety. He was discharged from the intensive outpatient program on 09/08/2011.  He reports a long-standing fear of losing control or having a nervous breakdown in a public setting. He understands that these fears are unfounded, but has been unable to let go of them. He has been prescribed Xanax for 21 years and imipramine, which was prescribed 21 years ago, but discontinued 4 years ago. He has also been seeing a therapist, Forde Radon, at [facility]. While in IOP he was started on Remeron 7.5 mg at bedtime which was then increased to 15 mg at bedtime.  He reports that his anxiety increased with the recent multiple losses of his wife, his home, and his business, all within a few months time last year. Although he made significant progress while in IOP, he continues to complain of ruminating thoughts that cause him to become anxious, and that make it hard for him to contact his daily activities. He denies any current suicidal or homicidal ideation. He denies any auditory or visual hallucinations.  Review of Systems  Constitutional: Negative.   HENT: Negative.   Eyes: Negative.   Respiratory: Negative.   Cardiovascular: Negative.   Gastrointestinal: Negative.   Endocrine: Negative.   Genitourinary: Negative.   Musculoskeletal: Negative.   Skin: Negative.   Allergic/Immunologic: Negative.   Neurological: Negative.     Hematological: Negative.   Psychiatric/Behavioral: The patient is nervous/anxious.     Physical Exam  Constitutional: He is oriented to person, place, and time. He appears well-developed and well-nourished.  HENT:  Head: Normocephalic and atraumatic.  Neck: Normal range of motion.  Musculoskeletal: Normal range of motion.  Neurological: He is alert and oriented to person, place, and time.   Sirron is a well-nourished well-developed white man who is fully alert and oriented and in no acute distress. He appears appropriate for his stated age of 84.  Depressive Symptoms: anhedonia, psychomotor agitation, difficulty concentrating, anxiety, loss of energy/fatigue,  (Hypo) Manic Symptoms:   Elevated Mood:  No Irritable Mood:  Yes Grandiosity:  No Distractibility:  No Labiality of Mood:  No Delusions:  No Hallucinations:  No Impulsivity:  No Sexually Inappropriate Behavior:  No Financial Extravagance:  No Flight of Ideas:  No  Anxiety Symptoms: Excessive Worry:  Yes Panic Symptoms:  Yes Agoraphobia:  No Obsessive Compulsive: No  Symptoms: None, Specific Phobias:  No Social Anxiety:  Yes  Psychotic Symptoms:  Hallucinations: No None Delusions:  No Paranoia:  No   Ideas of Reference:  No  PTSD Symptoms: Ever had a traumatic exposure:  No  Traumatic Brain Injury: No   Past Psychiatric History: Diagnosis: Anxiety disorder, major depressive disorder   Hospitalizations: None   Outpatient Care: Intensive outpatient program at [facility]   Substance Abuse Care: None   Self-Mutilation: None   Suicidal Attempts: None   Violent Behaviors: None    Past Medical History:   Past Medical History  Diagnosis Date  . Anxiety   .  Hypertension   . Depression    History of Loss of Consciousness:  No Seizure History:  No Cardiac History:  No Allergies:   Allergies  Allergen Reactions  . Demerol Other (See Comments)    Heart beats fast  . Erythromycin Nausea  And Vomiting  . Sulfa Antibiotics Nausea And Vomiting   Current Medications:  Current Outpatient Prescriptions  Medication Sig Dispense Refill  . ALPRAZolam (XANAX) 1 MG tablet Take 1 tablet (1 mg total) by mouth 4 (four) times daily.  120 tablet  1  . aspirin EC 325 MG tablet Take 325 mg by mouth daily.      Marland Kitchen atenolol (TENORMIN) 50 MG tablet Take 50 mg by mouth daily.      Marland Kitchen ibuprofen (ADVIL,MOTRIN) 200 MG tablet Take 200 mg by mouth every 6 (six) hours as needed. For pain      . mirtazapine (REMERON) 15 MG tablet Take 1 and 1/2 tablets at bedtime  45 tablet  1  . DISCONTD: mirtazapine (REMERON) 15 MG tablet Take 1 tablet (15 mg total) by mouth at bedtime.  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 Legal History:  Hobbies/Interests:   Family History:   Family History  Problem Relation Age of Onset  . Anxiety disorder Mother   . Depression Mother   . Anxiety disorder Sister   . Depression Sister   . Anxiety disorder Brother   . Depression Brother     Mental Status Examination/Evaluation: Objective:  Appearance: Casual  Eye Contact::  Good  Speech:  Clear and Coherent  Volume:  Normal  Mood:  Mildly anxious  Affect:  Congruent  Thought Process:  Linear  Orientation:  Full (Time, Place, and Person)  Thought Content:  WDL  Suicidal Thoughts:   No  Homicidal Thoughts:  No  Judgement:  Fair  Insight:  Fair  Psychomotor Activity:  Normal  Akathisia:  No  Handed:    AIMS (if indicated):    Assets:  Communication Skills Desire for Improvement Physical Health Vocational/Educational    Laboratory/X-Ray Psychological Evaluation(s)        Assessment:    AXIS I Generalized Anxiety Disorder and Major depressive disorder, recurrent, mild  AXIS II Cluster C Traits  AXIS III Past Medical History  Diagnosis Date  . Anxiety   . Hypertension   . Depression      AXIS IV problems related to social environment and problems with primary support group  AXIS V 51-60 moderate symptoms   Treatment Plan/Recommendations:  Plan of Care: We'll continue his medications per discharge from IOP  Laboratory:    Psychotherapy: Continue with his outpatient therapist  Medications: Xanax 1 mg 4 times daily, and Remeron 15 mg at bedtime  Routine PRN Medications:  No  Consultations: None  Safety  Concerns:  None  Other:      Orvell Careaga, PA-C 3/28/20134:52 PM

## 2011-10-03 ENCOUNTER — Other Ambulatory Visit (HOSPITAL_COMMUNITY): Payer: Self-pay | Admitting: Physician Assistant

## 2011-10-03 DIAGNOSIS — F411 Generalized anxiety disorder: Secondary | ICD-10-CM

## 2011-10-03 DIAGNOSIS — F331 Major depressive disorder, recurrent, moderate: Secondary | ICD-10-CM

## 2011-10-03 MED ORDER — MIRTAZAPINE 15 MG PO TABS: ORAL_TABLET | ORAL | Status: DC

## 2011-10-10 ENCOUNTER — Emergency Department (HOSPITAL_COMMUNITY): Payer: BC Managed Care – PPO

## 2011-10-10 ENCOUNTER — Encounter (HOSPITAL_COMMUNITY): Payer: Self-pay | Admitting: Emergency Medicine

## 2011-10-10 ENCOUNTER — Emergency Department (HOSPITAL_COMMUNITY)
Admission: EM | Admit: 2011-10-10 | Discharge: 2011-10-10 | Disposition: A | Discharge status: Home / Self Care | Payer: BC Managed Care – PPO | Attending: Emergency Medicine | Admitting: Emergency Medicine

## 2011-10-10 DIAGNOSIS — I10 Essential (primary) hypertension: Secondary | ICD-10-CM | POA: Insufficient documentation

## 2011-10-10 DIAGNOSIS — Z79899 Other long term (current) drug therapy: Secondary | ICD-10-CM | POA: Insufficient documentation

## 2011-10-10 DIAGNOSIS — R059 Cough, unspecified: Secondary | ICD-10-CM | POA: Insufficient documentation

## 2011-10-10 DIAGNOSIS — J069 Acute upper respiratory infection, unspecified: Secondary | ICD-10-CM | POA: Insufficient documentation

## 2011-10-10 DIAGNOSIS — J3489 Other specified disorders of nose and nasal sinuses: Secondary | ICD-10-CM | POA: Insufficient documentation

## 2011-10-10 DIAGNOSIS — R509 Fever, unspecified: Secondary | ICD-10-CM | POA: Insufficient documentation

## 2011-10-10 DIAGNOSIS — F341 Dysthymic disorder: Secondary | ICD-10-CM | POA: Insufficient documentation

## 2011-10-10 DIAGNOSIS — R05 Cough: Secondary | ICD-10-CM | POA: Insufficient documentation

## 2011-10-10 MED ORDER — FEXOFENADINE-PSEUDOEPHED ER 180-240 MG PO TB24: 1.0000 | ORAL_TABLET | Freq: Every day | ORAL | Status: DC

## 2011-10-10 NOTE — ED Provider Notes (Signed)
History    56 year old male with congestion. Symptom onset Friday evening. Persistent since. Nonproductive cough. Sensation of facial pressure. Subjective fever. Has been taking ibuprofen with some relief. No shortness of breath. No unusual leg pain or leg swelling. Throbbing sensation in bilateral ears. Denies ear pain. No ear drainage. No neck pain or neck stiffness. Patient concerned that his oxygen saturation was 97% when he says he is normally 98 or 99%. Patient concerned that he has pneumonia.  CSN: [id number]  Arrival date & time 10/10/11  1050   First MD Initiated Contact with Patient 10/10/11 1105      Chief Complaint  Patient presents with  . Fever  . Cough  . Nasal Congestion    (Consider location/radiation/quality/duration/timing/severity/associated sxs/prior treatment) HPI  Past Medical History  Diagnosis Date  . Anxiety   . Hypertension   . Depression     Past Surgical History  Procedure Date  . Tonsillectomy     Family History  Problem Relation Age of Onset  . Anxiety disorder Mother   . Depression Mother   . Anxiety disorder Sister   . Depression Sister   . Anxiety disorder Brother   . Depression Brother     History  Substance Use Topics  . Smoking status: Current Everyday Smoker -- 0.2 packs/day    Types: Cigarettes  . Smokeless tobacco: Never Used  . Alcohol Use: No      Review of Systems   Review of symptoms negative unless otherwise noted in HPI.   Allergies  Demerol; Erythromycin; and Sulfa antibiotics  Home Medications   Current Outpatient Rx  Name Route Sig Dispense Refill  . ALPRAZOLAM 1 MG PO TABS Oral Take 1 mg by mouth 4 (four) times daily.    . ASPIRIN EC 325 MG PO TBEC Oral Take 325 mg by mouth daily.    . ATENOLOL 50 MG PO TABS Oral Take 50 mg by mouth daily.    . IBUPROFEN 200 MG PO TABS Oral Take 200 mg by mouth every 6 (six) hours as needed. For pain    . MIRTAZAPINE 15 MG PO TABS Oral Take 22.5 mg by mouth at  bedtime.      BP 134/62  Pulse 67  Temp(Src) 97.9 F (36.6 C) (Oral)  Resp 16  SpO2 97%  Physical Exam  Nursing note and vitals reviewed. Constitutional: He appears well-developed and well-nourished. No distress.  HENT:  Head: Normocephalic and atraumatic.  Right Ear: External ear normal.  Left Ear: External ear normal.  Nose: Nose normal.  Mouth/Throat: Oropharynx is clear and moist. No oropharyngeal exudate.  Eyes: Conjunctivae are normal. Pupils are equal, round, and reactive to light. Right eye exhibits no discharge. Left eye exhibits no discharge.  Neck: Normal range of motion. Neck supple.  Cardiovascular: Normal rate, regular rhythm and normal heart sounds.  Exam reveals no gallop and no friction rub.   No murmur heard. Pulmonary/Chest: Effort normal and breath sounds normal. No respiratory distress. He has no wheezes. He has no rales. He exhibits no tenderness.  Abdominal: Soft. He exhibits no distension. There is no tenderness.  Musculoskeletal: He exhibits no edema and no tenderness.       Lower ext symmetric as compared to each other. No calf tenderness. Negative Homans. No edema.  Lymphadenopathy:    He has no cervical adenopathy.  Neurological: He is alert.  Skin: Skin is warm and dry. He is not diaphoretic.  Psychiatric: His behavior is normal. Thought content normal.  Anxious.    ED Course  Procedures (including critical care time)  Labs Reviewed - No data to display Dg Chest 2 View  10/10/2011  *RADIOLOGY REPORT*  Clinical Data: Cough and chest soreness.  CHEST - 2 VIEW  Comparison: 12/18/2010.  Findings: Trachea is midline.  Heart size normal.  Left perihilar and left basilar scarring is unchanged.  Lungs are otherwise clear. No pleural fluid.  IMPRESSION: No acute findings.  Original Report Authenticated By: Reyes Ivan, M.D.     1. URI (upper respiratory infection)       MDM  56 year old male with cough and congestion. URI symptoms,  likely viral. Possibly some component of seasonal allergies with warming weather and increased pollen. Clinically well-appearing. No respiratory distress. Suspect at least some component of anxiety. Low clinical suspicion for pneumonia but chest x-ray done at patient request. Chest x-ray does not show any acute abnormality. Plan symptomatic treatment. Return precautions were discussed. Outpatient followup as needed.        Raeford Razor, MD 10/10/11 1149

## 2011-10-10 NOTE — Discharge Instructions (Signed)
Antibiotic Nonuse  Your caregiver felt that the infection or problem was not one that would be helped with an antibiotic. Infections may be caused by viruses or bacteria. Only a caregiver can tell which one of these is the likely cause of an illness. A cold is the most common cause of infection in both adults and children. A cold is a virus. Antibiotic treatment will have no effect on a viral infection. Viruses can lead to many lost days of work caring for sick children and many missed days of school. Children may catch as many as 10 "colds" or "flus" per year during which they can be tearful, cranky, and uncomfortable. The goal of treating a virus is aimed at keeping the ill person comfortable. Antibiotics are medications used to help the body fight bacterial infections. There are relatively few types of bacteria that cause infections but there are hundreds of viruses. While both viruses and bacteria cause infection they are very different types of germs. A viral infection will typically go away by itself within 7 to 10 days. Bacterial infections may spread or get worse without antibiotic treatment. Examples of bacterial infections are:  Sore throats (like strep throat or tonsillitis).   Infection in the lung (pneumonia).   Ear and skin infections.  Examples of viral infections are:  Colds or flus.   Most coughs and bronchitis.   Sore throats not caused by Strep.   Runny noses.  It is often best not to take an antibiotic when a viral infection is the cause of the problem. Antibiotics can kill off the helpful bacteria that we have inside our body and allow harmful bacteria to start growing. Antibiotics can cause side effects such as allergies, nausea, and diarrhea without helping to improve the symptoms of the viral infection. Additionally, repeated uses of antibiotics can cause bacteria inside of our body to become resistant. That resistance can be passed onto harmful bacterial. The next time  you have an infection it may be harder to treat if antibiotics are used when they are not needed. Not treating with antibiotics allows our own immune system to develop and take care of infections more efficiently. Also, antibiotics will work better for us when they are prescribed for bacterial infections. Treatments for a child that is ill may include:  Give extra fluids throughout the day to stay hydrated.   Get plenty of rest.   Only give your child over-the-counter or prescription medicines for pain, discomfort, or fever as directed by your caregiver.   The use of a cool mist humidifier may help stuffy noses.   Cold medications if suggested by your caregiver.  Your caregiver may decide to start you on an antibiotic if:  The problem you were seen for today continues for a longer length of time than expected.   You develop a secondary bacterial infection.  SEEK MEDICAL CARE IF:  Fever lasts longer than 5 days.   Symptoms continue to get worse after 5 to 7 days or become severe.   Difficulty in breathing develops.   Signs of dehydration develop (poor drinking, rare urinating, dark colored urine).   Changes in behavior or worsening tiredness (listlessness or lethargy).  Document Released: 08/29/2001 Document Revised: 06/09/2011 Document Reviewed: 02/25/2009 ExitCare Patient Information 2012 [employer].Upper Respiratory Infection, Adult An upper respiratory infection (URI) is also sometimes known as the common cold. The upper respiratory tract includes the nose, sinuses, throat, trachea, and bronchi. Bronchi are the airways leading to the lungs. Most   people improve within 1 week, but symptoms can last up to 2 weeks. A residual cough may last even longer.  CAUSES Many different viruses can infect the tissues lining the upper respiratory tract. The tissues become irritated and inflamed and often become very moist. Mucus production is also common. A cold is contagious. You can easily  spread the virus to others by oral contact. This includes kissing, sharing a glass, coughing, or sneezing. Touching your mouth or nose and then touching a surface, which is then touched by another person, can also spread the virus. SYMPTOMS  Symptoms typically develop 1 to 3 days after you come in contact with a cold virus. Symptoms vary from person to person. They may include:  Runny nose.   Sneezing.   Nasal congestion.   Sinus irritation.   Sore throat.   Loss of voice (laryngitis).   Cough.   Fatigue.   Muscle aches.   Loss of appetite.   Headache.   Low-grade fever.  DIAGNOSIS  You might diagnose your own cold based on familiar symptoms, since most people get a cold 2 to 3 times a year. Your caregiver can confirm this based on your exam. Most importantly, your caregiver can check that your symptoms are not due to another disease such as strep throat, sinusitis, pneumonia, asthma, or epiglottitis. Blood tests, throat tests, and X-rays are not necessary to diagnose a common cold, but they may sometimes be helpful in excluding other more serious diseases. Your caregiver will decide if any further tests are required. RISKS AND COMPLICATIONS  You may be at risk for a more severe case of the common cold if you smoke cigarettes, have chronic heart disease (such as heart failure) or lung disease (such as asthma), or if you have a weakened immune system. The very young and very old are also at risk for more serious infections. Bacterial sinusitis, middle ear infections, and bacterial pneumonia can complicate the common cold. The common cold can worsen asthma and chronic obstructive pulmonary disease (COPD). Sometimes, these complications can require emergency medical care and may be life-threatening. PREVENTION  The best way to protect against getting a cold is to practice good hygiene. Avoid oral or hand contact with people with cold symptoms. Wash your hands often if contact occurs.  There is no clear evidence that vitamin C, vitamin E, echinacea, or exercise reduces the chance of developing a cold. However, it is always recommended to get plenty of rest and practice good nutrition. TREATMENT  Treatment is directed at relieving symptoms. There is no cure. Antibiotics are not effective, because the infection is caused by a virus, not by bacteria. Treatment may include:  Increased fluid intake. Sports drinks offer valuable electrolytes, sugars, and fluids.   Breathing heated mist or steam (vaporizer or shower).   Eating chicken soup or other clear broths, and maintaining good nutrition.   Getting plenty of rest.   Using gargles or lozenges for comfort.   Controlling fevers with ibuprofen or acetaminophen as directed by your caregiver.   Increasing usage of your inhaler if you have asthma.  Zinc gel and zinc lozenges, taken in the first 24 hours of the common cold, can shorten the duration and lessen the severity of symptoms. Pain medicines may help with fever, muscle aches, and throat pain. A variety of non-prescription medicines are available to treat congestion and runny nose. Your caregiver can make recommendations and may suggest nasal or lung inhalers for other symptoms.  HOME CARE INSTRUCTIONS     Only take over-the-counter or prescription medicines for pain, discomfort, or fever as directed by your caregiver.   Use a warm mist humidifier or inhale steam from a shower to increase air moisture. This may keep secretions moist and make it easier to breathe.   Drink enough water and fluids to keep your urine clear or pale yellow.   Rest as needed.   Return to work when your temperature has returned to normal or as your caregiver advises. You may need to stay home longer to avoid infecting others. You can also use a face mask and careful hand washing to prevent spread of the virus.  SEEK MEDICAL CARE IF:   After the first few days, you feel you are getting worse  rather than better.   You need your caregiver's advice about medicines to control symptoms.   You develop chills, worsening shortness of breath, or brown or red sputum. These may be signs of pneumonia.   You develop yellow or brown nasal discharge or pain in the face, especially when you bend forward. These may be signs of sinusitis.   You develop a fever, swollen neck glands, pain with swallowing, or white areas in the back of your throat. These may be signs of strep throat.  SEEK IMMEDIATE MEDICAL CARE IF:   You have a fever.   You develop severe or persistent headache, ear pain, sinus pain, or chest pain.   You develop wheezing, a prolonged cough, cough up blood, or have a change in your usual mucus (if you have chronic lung disease).   You develop sore muscles or a stiff neck.  Document Released: 12/14/2000 Document Revised: 06/09/2011 Document Reviewed: 10/22/2010 ExitCare Patient Information 2012 [employer].  RESOURCE GUIDE  Dental Problems  Patients with Medicaid: [facility]                      Dental [address]                                           [address] Phone:  [phone]                                                  Phone:  [phone]  If unable to pay or uninsured, contact:  [facility] or [facility] to become qualified for the adult dental clinic.  Chronic Pain Problems Contact [facility]  [phone] Patients need to be referred by their primary care doctor.  Insufficient Money for Medicine Contact [employer]:  call "211" or [facility] 271-5999.  No Primary Care Doctor Call [facility]  832-8000 Other agencies that provide inexpensive medical care    [facility]  832-8035    [facility]  832-7272    [facility]  [phone]    Women[facility] Clinic  832-4777    [facility]  373-0678    [facility]   272-1050  Psychological Services [facility]  832-9600 [facility]  [phone] [facility]   800 853-5163 (emergency services 641-4993)  Substance Abuse Resources [facility]  336-882-2125 [facility] 336-784-9470   [facility] 336-285-9073 [facility] 336-845-3988 Residential & Outpatient Substance Abuse Program  800-659-3381  Abuse/Neglect [place] Child Abuse Hotline (336) 641-3795 [place] Child Abuse Hotline 800-378-5315 (After Hours)  Emergency Shelter  [facility] (336) 271-5985  [facility] (336) 275-9566 [facility] (704) 372-4663  MRSA Hotline #:   832-7006    [place] Resources  [facility] [place]     [employer]                          [facility] [address] [place]                       [address]      [address]  [place]                                                [place]                            [place] Phone:  [phone]                                   Phone:  [phone]                 Phone:  [phone]  [facility] Phone:  342-8316  [place] Child Abuse Hotline (336) 342-1394 (336) 342-3537 (After Hours)   

## 2011-10-10 NOTE — ED Notes (Signed)
Pt reports started with weakness, chills, fevers, cough & congestion on Friday. Has been taking advil with some relief. Last dose was last night around 10p. No vomiting or diarrhea.

## 2011-10-24 ENCOUNTER — Ambulatory Visit (HOSPITAL_COMMUNITY): Admission: RE | Admit: 2011-10-24 | Payer: BC Managed Care – PPO | Source: Home / Self Care | Admitting: Psychiatry

## 2011-10-26 ENCOUNTER — Telehealth (HOSPITAL_COMMUNITY): Payer: Self-pay

## 2011-11-09 ENCOUNTER — Ambulatory Visit (INDEPENDENT_AMBULATORY_CARE_PROVIDER_SITE_OTHER): Payer: BC Managed Care – PPO | Admitting: Physician Assistant

## 2011-11-09 DIAGNOSIS — F411 Generalized anxiety disorder: Secondary | ICD-10-CM

## 2011-11-09 MED ORDER — ALPRAZOLAM 1 MG PO TABS: ORAL_TABLET | ORAL | Status: DC

## 2011-11-09 NOTE — Progress Notes (Signed)
   [facility] Behavioral Health Follow-up Outpatient Visit  Roger [name] [birth year]  Date: 11/09/2011   Subjective: Roger [name] presents today to followup on medications prescribed for anxiety. He reports that he has been doing well, and has not seen, nor felt he needed to see his counselor for about 6 weeks now. He feels that the Remeron causes him to sleep too long, and he is only taking one tablet as opposed to 1-1/2 tablets. He continues to have a significant amount of rumination, but he is able to function. He denies any suicidal or homicidal ideation he denies any auditory or visual hallucinations.  There were no vitals filed for this visit.  Mental Status Examination  Appearance: Well groomed and neatly dressed Alert: Yes Attention: good  Cooperative: Yes Eye Contact: Good Speech: Clear and coherent Psychomotor Activity: Normal Memory/Concentration: Intact Oriented: person, place, time/date and situation Mood: Anxious Affect: Congruent Thought Processes and Associations: Circumstantial Fund of Knowledge: Good Thought Content: Normal Insight: Fair Judgement: Good  Diagnosis: Generalized anxiety disorder  Treatment Plan: We will increase his Xanax slightly to 1 milligram 4 times daily and one half tablet daily as needed. We will decrease his Remeron to 7.5 mg at bedtime to see if it improves his energy during the day. He will followup in 6 weeks. He is encouraged to schedule an appointment with his therapist, and it was recommended that he look at the any [employer] personality typing program.  Jorje Guild, PA-C

## 2011-12-21 ENCOUNTER — Other Ambulatory Visit (HOSPITAL_COMMUNITY): Payer: Self-pay

## 2011-12-21 ENCOUNTER — Ambulatory Visit (INDEPENDENT_AMBULATORY_CARE_PROVIDER_SITE_OTHER): Payer: BC Managed Care – PPO | Admitting: Physician Assistant

## 2011-12-21 DIAGNOSIS — F411 Generalized anxiety disorder: Secondary | ICD-10-CM

## 2011-12-21 MED ORDER — ALPRAZOLAM 1 MG PO TABS: ORAL_TABLET | ORAL | Status: DC

## 2011-12-21 MED ORDER — ATENOLOL 50 MG PO TABS: 50.0000 mg | ORAL_TABLET | Freq: Every day | ORAL | Status: DC

## 2011-12-21 MED ORDER — MIRTAZAPINE 15 MG PO TABS: ORAL_TABLET | ORAL | Status: DC

## 2011-12-21 NOTE — Progress Notes (Signed)
   [facility] Behavioral Health Follow-up Outpatient Visit  Roger [name] [birth year]  Date: 12/21/2011   Subjective: Pierre presents today to followup on his treatment for anxiety. He reports that he has not seen his therapist  last appointment, and his mood has been up and down. He continues to ruminate and perseverate over existential and philosophical conundrums. He has not decreased the dose of Remeron, and continues to report that he feels tired during the day. He feels that the slight dose of Xanax we agreed to at his last visit has made a significant improvement. He denies any suicidal or homicidal ideation. He denies any auditory or visual hallucinations.  There were no vitals filed for this visit.  Mental Status Examination  Appearance: Well groomed and well dressed Alert: Yes Attention: good  Cooperative: Yes Eye Contact: Good Speech: Clear and coherent Psychomotor Activity: Normal Memory/Concentration: Intact Oriented: person, place, time/date and situation Mood: Euthymic Affect: Appropriate Thought Processes and Associations: Circumstantial Fund of Knowledge: Good Thought Content: Normal Insight: Fair Judgement: Good  Diagnosis: Generalized anxiety disorder  Treatment Plan: We will continue his Xanax at 1 mg 4 times daily and one half milligram daily as needed, Remeron 15 mg at bedtime, but he is to try taking one half of that for one week to see if his energy improves. Have also agreed to fill his atenolol for one month until he is able to find a new primary care provider. We will [facility] on [address] and the Remeron and atenolol are to be prescribed at the [facility] [address]. Deone will followup in 6-8 weeks.  Hazelynn Mckenny, PA-C

## 2012-02-07 ENCOUNTER — Ambulatory Visit (INDEPENDENT_AMBULATORY_CARE_PROVIDER_SITE_OTHER): Payer: BC Managed Care – PPO | Admitting: Physician Assistant

## 2012-02-07 DIAGNOSIS — F411 Generalized anxiety disorder: Secondary | ICD-10-CM

## 2012-02-07 MED ORDER — ALPRAZOLAM 1 MG PO TABS: ORAL_TABLET | ORAL | Status: DC

## 2012-02-10 NOTE — Progress Notes (Signed)
   [facility] Behavioral Health Follow-up Outpatient Visit  Roger [name] 1956/04/14  Date: 02/07/2012   Subjective: Roger [name] presents today to followup on his treatment for anxiety. He reports that overall he is doing well. He continues to be somewhat overly concerned about whether he may "lose it" one day. He denies any auditory or visual hallucinations. He denies any suicidal or homicidal ideation.  There were no vitals filed for this visit.  Mental Status Examination  Appearance: Well groomed and casually dressed Alert: Yes Attention: good  Cooperative: Yes Eye Contact: Good Speech: Clear and coherent Psychomotor Activity: Normal Memory/Concentration: Intact Oriented: person, place, time/date and situation Mood: Anxious and Euthymic Affect: Appropriate Thought Processes and Associations: Circumstantial and Logical Fund of Knowledge: Good Thought Content: Normal Insight: Good Judgement: Good  Diagnosis: Generalized anxiety disorder  Treatment Plan: We'll continue his Xanax 1 mg 4 times daily and one half milligram daily when necessary. We'll also continue the Remeron 15 mg at bedtime. Will return for followup in 2 months.  Lamiyah Schlotter, PA-C

## 2012-02-25 ENCOUNTER — Encounter (HOSPITAL_COMMUNITY): Payer: Self-pay | Admitting: *Deleted

## 2012-02-25 ENCOUNTER — Emergency Department (HOSPITAL_COMMUNITY): Payer: BC Managed Care – PPO

## 2012-02-25 ENCOUNTER — Emergency Department (HOSPITAL_COMMUNITY)
Admission: EM | Admit: 2012-02-25 | Discharge: 2012-02-26 | Disposition: A | Discharge status: Home / Self Care | Payer: BC Managed Care – PPO | Attending: Emergency Medicine | Admitting: Emergency Medicine

## 2012-02-25 DIAGNOSIS — S8010XA Contusion of unspecified lower leg, initial encounter: Secondary | ICD-10-CM | POA: Insufficient documentation

## 2012-02-25 DIAGNOSIS — I10 Essential (primary) hypertension: Secondary | ICD-10-CM | POA: Insufficient documentation

## 2012-02-25 DIAGNOSIS — F411 Generalized anxiety disorder: Secondary | ICD-10-CM | POA: Insufficient documentation

## 2012-02-25 DIAGNOSIS — Y998 Other external cause status: Secondary | ICD-10-CM | POA: Insufficient documentation

## 2012-02-25 DIAGNOSIS — IMO0002 Reserved for concepts with insufficient information to code with codable children: Secondary | ICD-10-CM | POA: Insufficient documentation

## 2012-02-25 DIAGNOSIS — Y9389 Activity, other specified: Secondary | ICD-10-CM | POA: Insufficient documentation

## 2012-02-25 DIAGNOSIS — F17200 Nicotine dependence, unspecified, uncomplicated: Secondary | ICD-10-CM | POA: Insufficient documentation

## 2012-02-25 DIAGNOSIS — T148XXA Other injury of unspecified body region, initial encounter: Secondary | ICD-10-CM

## 2012-02-25 NOTE — ED Notes (Signed)
Pt was placing fishing boat on trailer, his feet slipped under him and his left foot slipped under the boat and the boat landed immediately below his L knee.  Tennis ball size hematoma immediately below L knee.  Pt takes 1 baby asa per day, but no other blood thinners.

## 2012-02-25 NOTE — ED Provider Notes (Signed)
History     CSN: [id number]  Arrival date & time 02/25/12  2118   First MD Initiated Contact with Patient 02/25/12 2227      Chief Complaint  Patient presents with  . Leg Injury    (Consider location/radiation/quality/duration/timing/severity/associated sxs/prior treatment) HPI Comments: Patient was helped to put his boot up on the trailer when it slipped, hitting him on the anterior portion of the left shin.  There is a large hematoma.  This has progressed over the past several hours  The history is provided by the patient.    Past Medical History  Diagnosis Date  . Anxiety   . Hypertension   . Depression     Past Surgical History  Procedure Date  . Tonsillectomy     Family History  Problem Relation Age of Onset  . Anxiety disorder Mother   . Depression Mother   . Anxiety disorder Sister   . Depression Sister   . Anxiety disorder Brother   . Depression Brother     History  Substance Use Topics  . Smoking status: Current Everyday Smoker -- 0.2 packs/day    Types: Cigarettes  . Smokeless tobacco: Never Used  . Alcohol Use: No      Review of Systems  Constitutional: Negative for fever.  Musculoskeletal: Negative for joint swelling.  Skin: Positive for wound.  Neurological: Negative for weakness and numbness.    Allergies  Demerol; Erythromycin; and Sulfa antibiotics  Home Medications   Current Outpatient Rx  Name Route Sig Dispense Refill  . ALPRAZOLAM 1 MG PO TABS Oral Take 1 mg by mouth 4 (four) times daily.    . ASPIRIN EC 325 MG PO TBEC Oral Take 325 mg by mouth daily.    . ATENOLOL 50 MG PO TABS Oral Take 50 mg by mouth daily.    . IBUPROFEN 200 MG PO TABS Oral Take 200 mg by mouth every 6 (six) hours as needed. For pain    . MIRTAZAPINE 15 MG PO TABS Oral Take 7.5 mg by mouth at bedtime.    Marland Kitchen FEXOFENADINE-PSEUDOEPHED ER 180-240 MG PO TB24 Oral Take 1 tablet by mouth daily. 30 tablet 0    BP 123/75  Pulse 65  Temp 98 F (36.7 C) (Oral)   Resp 18  SpO2 98%  Physical Exam  Constitutional: He appears well-developed and well-nourished.  HENT:  Head: Normocephalic.  Eyes: Pupils are equal, round, and reactive to light.  Neck: Normal range of motion.  Cardiovascular: Normal rate.   Pulmonary/Chest: Effort normal.  Musculoskeletal: Normal range of motion. He exhibits edema and tenderness.       Legs: Neurological: He is alert.    ED Course  Procedures (including critical care time)  Labs Reviewed - No data to display Dg Tibia/fibula Left  02/25/2012  *RADIOLOGY REPORT*  Clinical Data: 56 year old male status post blunt trauma with pain and swelling.  LEFT TIBIA AND FIBULA - 2 VIEW  Comparison: Left ankle series 06/10/2011.  Findings: Anterior soft tissue swelling at the level of the proximal left tibia shaft.  Underlying tibia intact.  Normal bone mineralization.  Grossly normal alignment at the knee and ankle.  IMPRESSION: Soft tissue swelling/hematoma. No acute fracture or dislocation identified about the left tib-fib.   Original Report Authenticated By: Harley Hallmark, M.D.      1. Hematoma       MDM   Patient has a large hematoma explained RICE to him.  Applied.  An Ace bandage, and  knee immobilizer        Arman Filter, NP 02/25/12 2350  Arman Filter, NP 02/25/12 2351

## 2012-02-26 NOTE — ED Provider Notes (Signed)
Medical screening examination/treatment/procedure(s) were performed by non-physician practitioner and as supervising physician I was immediately available for consultation/collaboration.  Flint Melter, MD 02/26/12 [phone]

## 2012-02-28 ENCOUNTER — Other Ambulatory Visit (HOSPITAL_COMMUNITY): Payer: Self-pay | Admitting: *Deleted

## 2012-02-28 MED ORDER — MIRTAZAPINE 15 MG PO TABS: 7.5000 mg | ORAL_TABLET | Freq: Every day | ORAL | Status: DC

## 2012-02-28 NOTE — Telephone Encounter (Signed)
UJ:WJXBJY provider, Williemae Area,  told him to call when he was out of medicine and that provider would call it in to pharmacy. States he has 1 tablet left. Pharmacy is [facility], in [place] [phone]

## 2012-03-01 ENCOUNTER — Telehealth (HOSPITAL_COMMUNITY): Payer: Self-pay | Admitting: *Deleted

## 2012-03-01 NOTE — Telephone Encounter (Signed)
Pt left VM saying he had picked up  the prescription sent to the pharmacy on 8/27.  The RX was written for 45 tablets.Per pt, the pharmacy gave him 15 tablets (30 day supply according to pharmacy-since RX is for 1/2 tablet at HS). He said the provider told him he could take 1/2 to 1 tablet (7.5 to 15 mg) at bedtime) and he has been taking 1 whole tablet, and 15 tablets will not last.

## 2012-03-02 ENCOUNTER — Other Ambulatory Visit (HOSPITAL_COMMUNITY): Payer: Self-pay | Admitting: Physician Assistant

## 2012-03-02 ENCOUNTER — Encounter (HOSPITAL_COMMUNITY): Payer: Self-pay

## 2012-03-02 ENCOUNTER — Emergency Department (HOSPITAL_COMMUNITY)
Admission: EM | Admit: 2012-03-02 | Discharge: 2012-03-02 | Disposition: A | Discharge status: Home / Self Care | Payer: BC Managed Care – PPO | Attending: Emergency Medicine | Admitting: Emergency Medicine

## 2012-03-02 DIAGNOSIS — F411 Generalized anxiety disorder: Secondary | ICD-10-CM | POA: Insufficient documentation

## 2012-03-02 DIAGNOSIS — IMO0002 Reserved for concepts with insufficient information to code with codable children: Secondary | ICD-10-CM | POA: Insufficient documentation

## 2012-03-02 DIAGNOSIS — S8012XA Contusion of left lower leg, initial encounter: Secondary | ICD-10-CM

## 2012-03-02 DIAGNOSIS — I10 Essential (primary) hypertension: Secondary | ICD-10-CM | POA: Insufficient documentation

## 2012-03-02 DIAGNOSIS — F17200 Nicotine dependence, unspecified, uncomplicated: Secondary | ICD-10-CM | POA: Insufficient documentation

## 2012-03-02 DIAGNOSIS — Y9239 Other specified sports and athletic area as the place of occurrence of the external cause: Secondary | ICD-10-CM | POA: Insufficient documentation

## 2012-03-02 DIAGNOSIS — S8010XA Contusion of unspecified lower leg, initial encounter: Secondary | ICD-10-CM | POA: Insufficient documentation

## 2012-03-02 MED ORDER — MIRTAZAPINE 15 MG PO TABS: 15.0000 mg | ORAL_TABLET | Freq: Every day | ORAL | Status: DC

## 2012-03-02 NOTE — ED Notes (Signed)
Pt presents for re-evaluation of injury to L lower leg.  Pt reports a 10-foot boat fell onto L lower leg, pt seen here for same on Sunday.  Pt reports he has been unable to work due to swelling and pain.
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
## 2012-03-07 ENCOUNTER — Encounter (HOSPITAL_COMMUNITY): Payer: Self-pay

## 2012-03-07 ENCOUNTER — Emergency Department (HOSPITAL_COMMUNITY)
Admission: EM | Admit: 2012-03-07 | Discharge: 2012-03-07 | Disposition: A | Discharge status: Home / Self Care | Payer: BC Managed Care – PPO | Attending: Emergency Medicine | Admitting: Emergency Medicine

## 2012-03-07 DIAGNOSIS — M7989 Other specified soft tissue disorders: Secondary | ICD-10-CM | POA: Insufficient documentation

## 2012-03-07 NOTE — ED Notes (Signed)
Hx of leg swelling and painful. Seen in past for same 2 times and sts went back to work this past week and now has flared up agin, ace wrap in place in triage.

## 2012-04-16 ENCOUNTER — Ambulatory Visit (INDEPENDENT_AMBULATORY_CARE_PROVIDER_SITE_OTHER): Payer: BC Managed Care – PPO | Admitting: Physician Assistant

## 2012-04-16 DIAGNOSIS — F411 Generalized anxiety disorder: Secondary | ICD-10-CM

## 2012-04-16 MED ORDER — ALPRAZOLAM 1 MG PO TABS: 1.0000 mg | ORAL_TABLET | Freq: Four times a day (QID) | ORAL | Status: DC

## 2012-04-16 NOTE — Progress Notes (Signed)
   [facility] Behavioral Health Follow-up Outpatient Visit  Roger [name] Jul 02, 1956  Date: 04/16/2012   Subjective: [name] presents today to followup on history good for anxiety. He reports that he continues to do well. He is trying to learn to let go of some other worrisome thoughts that he has. He denies any suicidal or homicidal ideation he denies any auditory or visual hallucinations. He continues to take his Xanax 4 times daily with a next a half pill if needed. His therapist is extending his appointments out.  There were no vitals filed for this visit.  Mental Status Examination  Appearance: Well groomed and casually dressed Alert: Yes Attention: good  Cooperative: Yes Eye Contact: Good Speech: Clear and coherent Psychomotor Activity: Normal Memory/Concentration: Intact Oriented: person, place, time/date and situation Mood: Euthymic Affect: Appropriate Thought Processes and Associations: Linear Fund of Knowledge: Good Thought Content: Normal Insight: Good Judgement: Good  Diagnosis: Generalized anxiety disorder  Treatment Plan: We will continue his alprazolam 1 mg 4 times daily with an extra one half pill daily if needed. We will also continue his Remeron 15 mg at bedtime for sleep. He will return for followup per his request in 2 months.  Jonathon Castelo, PA-C

## 2012-06-01 ENCOUNTER — Emergency Department (HOSPITAL_COMMUNITY)
Admission: EM | Admit: 2012-06-01 | Discharge: 2012-06-01 | Disposition: A | Discharge status: Home / Self Care | Payer: BC Managed Care – PPO | Attending: Emergency Medicine | Admitting: Emergency Medicine

## 2012-06-01 ENCOUNTER — Emergency Department (HOSPITAL_COMMUNITY): Payer: BC Managed Care – PPO

## 2012-06-01 ENCOUNTER — Encounter (HOSPITAL_COMMUNITY): Payer: Self-pay | Admitting: Emergency Medicine

## 2012-06-01 DIAGNOSIS — J069 Acute upper respiratory infection, unspecified: Secondary | ICD-10-CM

## 2012-06-01 DIAGNOSIS — J3489 Other specified disorders of nose and nasal sinuses: Secondary | ICD-10-CM | POA: Insufficient documentation

## 2012-06-01 DIAGNOSIS — F3289 Other specified depressive episodes: Secondary | ICD-10-CM | POA: Insufficient documentation

## 2012-06-01 DIAGNOSIS — F411 Generalized anxiety disorder: Secondary | ICD-10-CM | POA: Insufficient documentation

## 2012-06-01 DIAGNOSIS — F329 Major depressive disorder, single episode, unspecified: Secondary | ICD-10-CM | POA: Insufficient documentation

## 2012-06-01 DIAGNOSIS — I10 Essential (primary) hypertension: Secondary | ICD-10-CM | POA: Insufficient documentation

## 2012-06-01 DIAGNOSIS — Z79899 Other long term (current) drug therapy: Secondary | ICD-10-CM | POA: Insufficient documentation

## 2012-06-01 DIAGNOSIS — R0982 Postnasal drip: Secondary | ICD-10-CM | POA: Insufficient documentation

## 2012-06-01 DIAGNOSIS — R6889 Other general symptoms and signs: Secondary | ICD-10-CM | POA: Insufficient documentation

## 2012-06-01 DIAGNOSIS — F17200 Nicotine dependence, unspecified, uncomplicated: Secondary | ICD-10-CM | POA: Insufficient documentation

## 2012-06-01 MED ORDER — GUAIFENESIN-CODEINE 100-10 MG/5ML PO SYRP: 5.0000 mL | ORAL_SOLUTION | Freq: Three times a day (TID) | ORAL | Status: DC | PRN

## 2012-06-01 NOTE — ED Provider Notes (Signed)
History     CSN: [id number]  Arrival date & time 06/01/12  1233   First MD Initiated Contact with Patient 06/01/12 1248      Chief Complaint  Patient presents with  . URI    (Consider location/radiation/quality/duration/timing/severity/associated sxs/prior treatment) HPI Comments: Patient presents with a one-week history of worsening runny nose and head congestion. He states it's moved down toward his throat. He's had a cough which is nonproductive. He denies any fevers or chills. Denies any vomiting diarrhea. Denies any shortness of breath. She's been taking over-the-counter medicines without relief.   Past Medical History  Diagnosis Date  . Anxiety   . Hypertension   . Depression     Past Surgical History  Procedure Date  . Tonsillectomy     Family History  Problem Relation Age of Onset  . Anxiety disorder Mother   . Depression Mother   . Anxiety disorder Sister   . Depression Sister   . Anxiety disorder Brother   . Depression Brother     History  Substance Use Topics  . Smoking status: Current Every Day Smoker -- 0.2 packs/day    Types: Cigarettes  . Smokeless tobacco: Never Used  . Alcohol Use: No      Review of Systems  Constitutional: Negative for fever, chills, diaphoresis and fatigue.  HENT: Positive for congestion, rhinorrhea, sneezing and postnasal drip.   Eyes: Negative.   Respiratory: Positive for cough. Negative for chest tightness and shortness of breath.   Cardiovascular: Negative for chest pain and leg swelling.  Gastrointestinal: Negative for nausea, vomiting, abdominal pain, diarrhea and blood in stool.  Genitourinary: Negative for frequency, hematuria, flank pain and difficulty urinating.  Musculoskeletal: Negative for back pain and arthralgias.  Skin: Negative for rash.  Neurological: Negative for dizziness, speech difficulty, weakness, numbness and headaches.    Allergies  Demerol; Erythromycin; and Sulfa antibiotics  Home  Medications   Current Outpatient Rx  Name  Route  Sig  Dispense  Refill  . ALPRAZOLAM 1 MG PO TABS   Oral   Take 1 tablet (1 mg total) by mouth 4 (four) times daily. May take 1/2 tablet extra daily if needed.   135 tablet   1   . ASPIRIN EC 325 MG PO TBEC   Oral   Take 325 mg by mouth daily.         . ATENOLOL 50 MG PO TABS   Oral   Take 50 mg by mouth daily.         . GUAIFENESIN-CODEINE 100-10 MG/5ML PO SYRP   Oral   Take 5 mLs by mouth 3 (three) times daily as needed for cough.   120 mL   0   . IBUPROFEN 200 MG PO TABS   Oral   Take 200 mg by mouth every 6 (six) hours as needed. For pain         . MIRTAZAPINE 15 MG PO TABS   Oral   Take 1 tablet (15 mg total) by mouth at bedtime.   90 tablet   0     BP 150/57  Pulse 58  Temp 97.6 F (36.4 C) (Oral)  Resp 18  SpO2 100%  Physical Exam  Constitutional: He is oriented to person, place, and time. He appears well-developed and well-nourished.  HENT:  Head: Normocephalic and atraumatic.  Right Ear: External ear normal.  Left Ear: External ear normal.  Mouth/Throat: Oropharynx is clear and moist.  Eyes: Conjunctivae normal are normal. Pupils  are equal, round, and reactive to light.  Neck: Normal range of motion. Neck supple.  Cardiovascular: Normal rate, regular rhythm and normal heart sounds.   Pulmonary/Chest: Effort normal and breath sounds normal. No respiratory distress. He has no wheezes. He has no rales. He exhibits no tenderness.  Abdominal: Soft. Bowel sounds are normal. There is no tenderness. There is no rebound and no guarding.  Musculoskeletal: Normal range of motion. He exhibits no edema.  Lymphadenopathy:    He has no cervical adenopathy.  Neurological: He is alert and oriented to person, place, and time.  Skin: Skin is warm and dry. No rash noted.  Psychiatric: He has a normal mood and affect.    ED Course  Procedures (including critical care time)  Labs Reviewed - No data to  display Dg Chest 2 View  06/01/2012  *RADIOLOGY REPORT*  Clinical Data: Productive cough for 1 week.  Smoker.  CHEST - 2 VIEW  Comparison: 10/10/2011.  Findings:  Cardiopericardial silhouette within normal limits. Mediastinal contours normal. Trachea midline.  No airspace disease or effusion.  IMPRESSION: No active cardiopulmonary disease.   Original Report Authenticated By: Andreas Newport, M.D.      1. URI (upper respiratory infection)       MDM  Patient is well-appearing. Lungs are clear. There is no evidence of pneumonia. Oxygen saturations are 100% any afebrile. I will give him prescription for Robitussin-AC cough syrup advised to call his primary care physician within the next week if his symptoms are not improved or return here as needed for any worsening symptoms        Rolan Bucco, MD 06/01/12 1323

## 2012-06-01 NOTE — ED Notes (Signed)
Pt c/o head congestion and URI sx x several weeks; pt sts some SOB at times and cough

## 2012-06-11 ENCOUNTER — Other Ambulatory Visit (HOSPITAL_COMMUNITY): Payer: Self-pay | Admitting: Physician Assistant

## 2012-06-13 ENCOUNTER — Other Ambulatory Visit (HOSPITAL_COMMUNITY): Payer: Self-pay | Admitting: *Deleted

## 2012-06-13 MED ORDER — ALPRAZOLAM 1 MG PO TABS: 1.0000 mg | ORAL_TABLET | Freq: Four times a day (QID) | ORAL | Status: DC

## 2012-06-19 ENCOUNTER — Other Ambulatory Visit (HOSPITAL_COMMUNITY): Payer: Self-pay | Admitting: *Deleted

## 2012-06-19 ENCOUNTER — Ambulatory Visit (INDEPENDENT_AMBULATORY_CARE_PROVIDER_SITE_OTHER): Payer: BC Managed Care – PPO | Admitting: Physician Assistant

## 2012-06-19 DIAGNOSIS — F411 Generalized anxiety disorder: Secondary | ICD-10-CM

## 2012-06-19 MED ORDER — ALPRAZOLAM 1 MG PO TABS: 1.0000 mg | ORAL_TABLET | Freq: Four times a day (QID) | ORAL | Status: DC

## 2012-06-19 NOTE — Telephone Encounter (Signed)
Per [facility], refill of Xanax called to [facility] [phone] by provider on 06/13/12. Per Forde Radon' at [facility], no record of RX being called in on 06/13/12. Called in today.

## 2012-06-20 NOTE — Progress Notes (Signed)
   [facility] Behavioral Health Follow-up Outpatient Visit  Roger [name] [birth year]  Date: 06/19/2012   Subjective: Roger [name] presents today to followup on his treatment for anxiety. He reports he was doing okay until 2 weeks ago when he began "deep thinking," which caused his anxiety to increase. He reports he has a tendency to begin to question who he is and why he exists. He gets in a pattern of rumination over this, which causes his anxiety to escalate. He reports he is sleeping and eating well. He denies any suicidal or homicidal ideation. He continues to take his medication as prescribed.  There were no vitals filed for this visit.  Mental Status Examination  Appearance: Well groomed and casually dressed Alert: Yes Attention: good  Cooperative: Yes Eye Contact: Good Speech: Clear and coherent Psychomotor Activity: Normal Memory/Concentration: Intact Oriented: person, place, time/date and situation Mood: Euthymic Affect: Congruent Thought Processes and Associations: Goal Directed Fund of Knowledge: Good Thought Content: Normal Insight: Fair Judgement: Good  Diagnosis: Generalized anxiety disorder  Treatment Plan: We will continue his Xanax 1 mg 4 times daily, with a next her one half tablet daily if necessary. We'll also continue his Remeron 15 mg at bedtime. He has been given some exercises in being able to stay in the ear and now. He will return for followup in 3 months.  Aniaya Bacha, PA-C

## 2012-07-18 ENCOUNTER — Emergency Department (HOSPITAL_COMMUNITY): Payer: BC Managed Care – PPO

## 2012-07-18 ENCOUNTER — Encounter (HOSPITAL_COMMUNITY): Payer: Self-pay | Admitting: Emergency Medicine

## 2012-07-18 ENCOUNTER — Emergency Department (HOSPITAL_COMMUNITY)
Admission: EM | Admit: 2012-07-18 | Discharge: 2012-07-18 | Disposition: A | Discharge status: Home / Self Care | Payer: BC Managed Care – PPO | Attending: Emergency Medicine | Admitting: Emergency Medicine

## 2012-07-18 DIAGNOSIS — I10 Essential (primary) hypertension: Secondary | ICD-10-CM | POA: Insufficient documentation

## 2012-07-18 DIAGNOSIS — J329 Chronic sinusitis, unspecified: Secondary | ICD-10-CM | POA: Insufficient documentation

## 2012-07-18 DIAGNOSIS — F329 Major depressive disorder, single episode, unspecified: Secondary | ICD-10-CM | POA: Insufficient documentation

## 2012-07-18 DIAGNOSIS — Z79899 Other long term (current) drug therapy: Secondary | ICD-10-CM | POA: Insufficient documentation

## 2012-07-18 DIAGNOSIS — Z7982 Long term (current) use of aspirin: Secondary | ICD-10-CM | POA: Insufficient documentation

## 2012-07-18 DIAGNOSIS — F3289 Other specified depressive episodes: Secondary | ICD-10-CM | POA: Insufficient documentation

## 2012-07-18 DIAGNOSIS — F17200 Nicotine dependence, unspecified, uncomplicated: Secondary | ICD-10-CM | POA: Insufficient documentation

## 2012-07-18 DIAGNOSIS — F411 Generalized anxiety disorder: Secondary | ICD-10-CM | POA: Insufficient documentation

## 2012-07-18 LAB — COMPREHENSIVE METABOLIC PANEL
ALT: 13 U/L (ref 0–53)
AST: 19 U/L (ref 0–37)
Albumin: 3.8 g/dL (ref 3.5–5.2)
CO2: 28 mEq/L (ref 19–32)
Calcium: 9.7 mg/dL (ref 8.4–10.5)
Chloride: 103 mEq/L (ref 96–112)
Creatinine, Ser: 0.7 mg/dL (ref 0.50–1.35)
Sodium: 140 mEq/L (ref 135–145)

## 2012-07-18 LAB — CBC
MCV: 93.1 fL (ref 78.0–100.0)
Platelets: 184 10*3/uL (ref 150–400)
RBC: 4.75 MIL/uL (ref 4.22–5.81)
RDW: 12.1 % (ref 11.5–15.5)
WBC: 7.7 10*3/uL (ref 4.0–10.5)

## 2012-07-18 LAB — POCT I-STAT TROPONIN I

## 2012-07-18 MED ORDER — AMOXICILLIN 500 MG PO CAPS: 500.0000 mg | ORAL_CAPSULE | Freq: Three times a day (TID) | ORAL | Status: DC

## 2012-07-18 MED ORDER — SODIUM CHLORIDE 0.9 % IV SOLN: 1000.0000 mL | INTRAVENOUS | Status: DC

## 2012-07-18 MED ORDER — ASPIRIN 81 MG PO CHEW: 324.0000 mg | CHEWABLE_TABLET | Freq: Once | ORAL | Status: DC

## 2012-07-18 MED ORDER — NITROGLYCERIN 0.4 MG SL SUBL
0.4000 mg | SUBLINGUAL_TABLET | SUBLINGUAL | Status: DC | PRN
Start: 2012-07-18 — End: 2012-07-18

## 2012-07-18 NOTE — ED Notes (Signed)
Pt here for URI sx with cough and congestion; pt sts some fever at times x 3 weeks

## 2012-07-18 NOTE — ED Notes (Signed)
Pt reports he is having episodes of weakness and chest heaviness over the past couple of weeks. Sx worse with exertion.

## 2012-07-18 NOTE — ED Notes (Signed)
Report given to Tracie,RN

## 2012-07-18 NOTE — ED Notes (Signed)
Patient transported to X-ray 

## 2012-07-18 NOTE — ED Notes (Signed)
Patient transported to X-ray for repeat xray

## 2012-07-18 NOTE — ED Provider Notes (Signed)
History     CSN: [id number]  Arrival date & time 07/18/12  1249   First MD Initiated Contact with Patient 07/18/12 1314      Chief Complaint  Patient presents with  . Chest Pain    (Consider location/radiation/quality/duration/timing/severity/associated sxs/prior treatment) HPI  Roger [name] is a 57 y.o. male complaining of nasal congestion with intermittent ear pressure and dry cough for 3 weeks. Patient recently developed weakness and right upper chest pain described as burning he has the pain only when he stretches. 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 He denies any shortness of breath, abdominal pain, nausea vomiting, change in bowel or bladder habits. Pain is non-radiating, it is nonexertional.   Past Medical History  Diagnosis Date  . Anxiety   . Hypertension   . Depression     Past Surgical History  Procedure Date  . Tonsillectomy     Family History  Problem Relation Age of Onset  . Anxiety disorder Mother   . Depression Mother   . Anxiety disorder Sister   . Depression Sister   . Anxiety disorder Brother   . Depression Brother     History  Substance Use Topics  . Smoking status: Current Every Day Smoker -- 0.2 packs/day    Types: Cigarettes  . Smokeless tobacco: Never Used  . Alcohol Use: No      Review of Systems  Constitutional: Negative for fever.  Respiratory: Negative for shortness of breath.   Cardiovascular: Negative for chest pain.  Gastrointestinal: Negative for nausea, vomiting, abdominal pain and diarrhea.  All other systems reviewed and are negative.    Allergies  Demerol; Erythromycin; and Sulfa antibiotics  Home Medications   Current Outpatient Rx  Name  Route  Sig  Dispense  Refill  . ALPRAZOLAM 1 MG PO TABS   Oral   Take 1 mg by mouth 4 (four) times daily.         . ASPIRIN 325 MG PO TABS   Oral   Take 325 mg by mouth every morning.         . ATENOLOL 50 MG PO TABS   Oral   Take 50 mg by mouth every morning.          . IBUPROFEN 200 MG PO TABS   Oral   Take 200 mg by mouth every 6 (six) hours as needed. For pain         . MIRTAZAPINE 15 MG PO TABS   Oral   Take 15 mg by mouth at bedtime.           BP 138/66  Pulse 61  Temp 97.4 F (36.3 C) (Oral)  Resp 16  SpO2 99%  Physical Exam  Nursing note and vitals reviewed. Constitutional: He is oriented to person, place, and time. He appears well-developed and well-nourished. No distress.  HENT:  Head: Normocephalic and atraumatic.  Mouth/Throat: Oropharynx is clear and moist.  Eyes: Conjunctivae normal and EOM are normal. Pupils are equal, round, and reactive to light.  Cardiovascular: Normal rate, regular rhythm and intact distal pulses.   Pulmonary/Chest: Effort normal and breath sounds normal. No stridor. No respiratory distress. He has no wheezes. He has no rales. He exhibits no tenderness.  Abdominal: Soft. Bowel sounds are normal. He exhibits no distension and no mass. There is no tenderness. There is no rebound and no guarding.  Musculoskeletal: Normal  range of motion.  Neurological: He is alert and oriented to person, place, and time.  Psychiatric: He has a normal mood and affect.    ED Course  Procedures (including critical care time)  Labs Reviewed  COMPREHENSIVE METABOLIC PANEL - Abnormal; Notable for the following:    Glucose, Bld 123 (*)     All other components within normal limits  CBC  POCT I-STAT TROPONIN I   Dg Chest 2 View  07/18/2012  *RADIOLOGY REPORT*  Clinical Data: Chest pain and fever  CHEST - 2 VIEW  Comparison: June 01, 2012  Findings: There is a questionable nipple shadow at the right base. There is mild scarring in the left base.  Elsewhere lungs clear. Heart size and pulmonary vascularity are within normal limits.  No adenopathy.  No bone lesions.  Impression:  Question nipple shadow right base.  Advise repeat study with  nipple markers to confirm.  No edema or consolidation. Mild scarring left base.   Original Report Authenticated By: Bretta Bang, M.D.     Date: 07/18/2012  Rate: 53  Rhythm: sinus bradycardia  QRS Axis: normal  Intervals: normal  ST/T Wave abnormalities: normal  Conduction Disutrbances:none  Narrative Interpretation:   Old EKG Reviewed: none available    1. Sinusitis       MDM   Patient with lingering upper respiratory illness likely developing into a sinusitis and chest wall tenderness a muscle strain.  Pt verbalized understanding and agrees with care plan. Outpatient follow-up and return precautions given.    New Prescriptions   AMOXICILLIN (AMOXIL) 500 MG CAPSULE    Take 1 capsule (500 mg total) by mouth 3 (three) times daily.           Wynetta Emery, PA-C 07/18/12 1622

## 2012-07-19 NOTE — ED Provider Notes (Signed)
Medical screening examination/treatment/procedure(s) were performed by non-physician practitioner and as supervising physician I was immediately available for consultation/collaboration.   Lyanne Co, MD 07/19/12 [phone]

## 2012-08-03 ENCOUNTER — Telehealth (HOSPITAL_COMMUNITY): Payer: Self-pay

## 2012-08-06 ENCOUNTER — Telehealth (HOSPITAL_COMMUNITY): Payer: Self-pay | Admitting: *Deleted

## 2012-08-06 NOTE — Telephone Encounter (Signed)
Requested change in pharmacy from [facility] to [facility]

## 2012-08-29 ENCOUNTER — Telehealth (HOSPITAL_COMMUNITY): Payer: Self-pay | Admitting: *Deleted

## 2012-08-30 ENCOUNTER — Other Ambulatory Visit (HOSPITAL_COMMUNITY): Payer: Self-pay | Admitting: *Deleted

## 2012-08-30 NOTE — Telephone Encounter (Signed)
Pt left [id number] refill on Xanax. Last refill sent to [facility] of [facility]. [facility] [address]

## 2012-08-31 MED ORDER — ALPRAZOLAM 1 MG PO TABS: 1.0000 mg | ORAL_TABLET | Freq: Four times a day (QID) | ORAL | Status: DC

## 2012-09-18 NOTE — Telephone Encounter (Signed)
error 

## 2012-09-24 ENCOUNTER — Ambulatory Visit (INDEPENDENT_AMBULATORY_CARE_PROVIDER_SITE_OTHER): Payer: BC Managed Care – PPO | Admitting: Physician Assistant

## 2012-09-24 DIAGNOSIS — F411 Generalized anxiety disorder: Secondary | ICD-10-CM

## 2012-09-24 MED ORDER — ALPRAZOLAM 1 MG PO TABS: 1.0000 mg | ORAL_TABLET | Freq: Four times a day (QID) | ORAL | Status: DC

## 2012-09-24 MED ORDER — MIRTAZAPINE 15 MG PO TABS: 15.0000 mg | ORAL_TABLET | Freq: Every day | ORAL | Status: DC

## 2012-09-24 NOTE — Progress Notes (Signed)
   [facility] Behavioral Health Follow-up Outpatient Visit  Roger [name] July 30, 1955  Date: 09/24/2012   Subjective: Roger [name] presents today to followup on his treatment for anxiety. He reports that he continues to do well. He continues to endorse a pattern of deep introspection and over thinking. He denies that this has affected his ability to function in any way. He continues to take his medications as prescribed, and denies any side effects. He endorses good sleep and appetite.  There were no vitals filed for this visit.  Mental Status Examination  Appearance: Well groomed and casually dressed Alert: Yes Attention: good  Cooperative: Yes Eye Contact: Good Speech: Clear and coherent Psychomotor Activity: Normal Memory/Concentration: Intact Oriented: person, place, time/date and situation Mood: Anxious , mildly Affect: Appropriate Thought Processes and Associations: Circumstantial Fund of Knowledge: Good Thought Content: Normal Insight: Good Judgement: Good  Diagnosis: Generalized anxiety disorder  Treatment Plan: We will continue his Xanax 1 mg 4 times daily, with a next her one half tablet daily if necessary. We'll also continue his Remeron 15 mg at bedtime. He has been reminded of the exercises in being able to stay in the here and now, and is encouraged to make use of them. He will return for followup in 3 months.   Roger Mcleish, PA-C

## 2012-10-08 ENCOUNTER — Telehealth (HOSPITAL_COMMUNITY): Payer: Self-pay | Admitting: *Deleted

## 2012-10-08 MED ORDER — ALPRAZOLAM 1 MG PO TABS: 1.0000 mg | ORAL_TABLET | Freq: Four times a day (QID) | ORAL | Status: DC

## 2012-10-08 NOTE — Telephone Encounter (Signed)
Contacted by Almira Coaster seeking clarification on Xanax RX written 3/24 for #135 tablets,4 x day.Pt states should also have "1/2 tablet daily as needed". Pharmacist requests call to add directions to RX if appropriate.Change authorized by A.Elmon Kirschner, [place]

## 2012-10-18 ENCOUNTER — Other Ambulatory Visit: Payer: Self-pay | Admitting: Occupational Medicine

## 2012-10-18 ENCOUNTER — Ambulatory Visit: Payer: Self-pay

## 2012-10-18 DIAGNOSIS — R52 Pain, unspecified: Secondary | ICD-10-CM

## 2012-12-24 ENCOUNTER — Encounter (HOSPITAL_COMMUNITY): Payer: Self-pay

## 2012-12-24 ENCOUNTER — Encounter (HOSPITAL_COMMUNITY): Payer: Self-pay | Admitting: Physician Assistant

## 2012-12-24 ENCOUNTER — Ambulatory Visit (INDEPENDENT_AMBULATORY_CARE_PROVIDER_SITE_OTHER): Payer: BC Managed Care – PPO | Admitting: Physician Assistant

## 2012-12-24 VITALS — BP 133/73 | HR 61 | Ht 71.75 in | Wt 191.4 lb

## 2012-12-24 DIAGNOSIS — F411 Generalized anxiety disorder: Secondary | ICD-10-CM

## 2012-12-24 MED ORDER — MIRTAZAPINE 15 MG PO TABS: 15.0000 mg | ORAL_TABLET | Freq: Every day | ORAL | Status: DC

## 2012-12-24 MED ORDER — ALPRAZOLAM 1 MG PO TABS: 1.0000 mg | ORAL_TABLET | Freq: Four times a day (QID) | ORAL | Status: DC

## 2012-12-24 NOTE — Progress Notes (Signed)
   [facility] Behavioral Health Follow-up Outpatient Visit  Roger [name] [birth year]  Date: 12/24/2012   Subjective: [name] presents today to followup on his treatment for anxiety. He reports that things are "up and down." When asked why he stated that his counselor, Marchelle Folks at [facility], is going on a 2 month maternity leave. He continues to obsess over his process of thinking excessively about existential matters. He reports that he is going to run out of Xanax early this month as he has been taking a whole extra Xanax 2-3 times per week, rather than a half of a tablet as is prescribed. His current prescription should last until July 7, but looks like it will run out a few days early. He denies any suicidal or homicidal ideation. He denies any auditory or visual hallucinations.  Filed Vitals:   12/24/12 1335  BP: 133/73  Pulse: 61    Mental Status Examination  Appearance: Casual Alert: Yes Attention: good  Cooperative: Yes Eye Contact: Good Speech: Clear and coherent Psychomotor Activity: Normal Memory/Concentration: Intact Oriented: person, place, time/date and situation Mood: Anxious Affect: Congruent Thought Processes and Associations: Coherent Fund of Knowledge: Good Thought Content: Normal Insight: Good Judgement: Good  Diagnosis: Generalized anxiety disorder  Treatment Plan: We will increase his Xanax to take 1 mg 4 times daily, and one daily as needed. We'll continue his Remeron 15 mg at bedtime. He will return for followup in 3 months.  Rondrick Barreira, PA-C

## 2012-12-27 ENCOUNTER — Emergency Department (HOSPITAL_COMMUNITY)
Admission: EM | Admit: 2012-12-27 | Discharge: 2012-12-27 | Disposition: A | Discharge status: Home / Self Care | Payer: BC Managed Care – PPO | Attending: Emergency Medicine | Admitting: Emergency Medicine

## 2012-12-27 ENCOUNTER — Encounter (HOSPITAL_COMMUNITY): Payer: Self-pay | Admitting: Emergency Medicine

## 2012-12-27 DIAGNOSIS — F411 Generalized anxiety disorder: Secondary | ICD-10-CM | POA: Insufficient documentation

## 2012-12-27 DIAGNOSIS — I10 Essential (primary) hypertension: Secondary | ICD-10-CM | POA: Insufficient documentation

## 2012-12-27 DIAGNOSIS — F329 Major depressive disorder, single episode, unspecified: Secondary | ICD-10-CM | POA: Insufficient documentation

## 2012-12-27 DIAGNOSIS — F17200 Nicotine dependence, unspecified, uncomplicated: Secondary | ICD-10-CM | POA: Insufficient documentation

## 2012-12-27 DIAGNOSIS — F3289 Other specified depressive episodes: Secondary | ICD-10-CM | POA: Insufficient documentation

## 2012-12-27 DIAGNOSIS — Z7982 Long term (current) use of aspirin: Secondary | ICD-10-CM | POA: Insufficient documentation

## 2012-12-27 DIAGNOSIS — M722 Plantar fascial fibromatosis: Secondary | ICD-10-CM | POA: Insufficient documentation

## 2012-12-27 DIAGNOSIS — Z79899 Other long term (current) drug therapy: Secondary | ICD-10-CM | POA: Insufficient documentation

## 2012-12-27 MED ORDER — TRAMADOL HCL 50 MG PO TABS: 50.0000 mg | ORAL_TABLET | Freq: Four times a day (QID) | ORAL | Status: DC | PRN

## 2012-12-27 NOTE — ED Notes (Signed)
Pt has soft, swollen area on the arch of left foot x 3 days. States is "painful in places" on palpation. Has a burning sensation at times.

## 2012-12-27 NOTE — ED Provider Notes (Signed)
History    CSN: [id number] Arrival date & time 12/27/12  4098  First MD Initiated Contact with Patient 12/27/12 1031     Chief Complaint  Patient presents with  . Foot Pain   (Consider location/radiation/quality/duration/timing/severity/associated sxs/prior Treatment) The history is provided by the patient.   Patient presents to the ED for left foot pain x 4 days. Pain localized to medial aspect of left heel and described as a "burning" sensation.  No recent injury or trauma.  Patient is very active and is on his feet most hours of the day.  He also walks daily for exercise.  He does admit to wearing new shoes over the past few days.  Has been taking Aleve for pain and with some relief.  Has never seen podiatry for these sx.  No hx of Gout.  Patient states he has had intermittent episodes of similar pain, however these were without the associated burning sensation which made him concerned for a possible blood clot of his foot.  No hx of DVT.     Past Medical History  Diagnosis Date  . Anxiety   . Hypertension   . Depression    Past Surgical History  Procedure Laterality Date  . Tonsillectomy     Family History  Problem Relation Age of Onset  . Anxiety disorder Mother   . Depression Mother   . Anxiety disorder Sister   . Depression Sister   . Anxiety disorder Brother   . Depression Brother    History  Substance Use Topics  . Smoking status: Current Every Day Smoker -- 0.25 packs/day    Types: Cigarettes  . Smokeless tobacco: Never Used  . Alcohol Use: No    Review of Systems  Musculoskeletal:       Foot pain  All other systems reviewed and are negative.    Allergies  Demerol; Erythromycin; and Sulfa antibiotics  Home Medications   Current Outpatient Rx  Name  Route  Sig  Dispense  Refill  . ALPRAZolam (XANAX) 1 MG tablet   Oral   Take 1 tablet (1 mg total) by mouth 4 (four) times daily. May also take 1/2 tablet daily as needed   150 tablet   2     RX  Clarified 10/08/12.Not a new RX   . aspirin 325 MG tablet   Oral   Take 325 mg by mouth every morning.         Marland Kitchen atenolol (TENORMIN) 50 MG tablet   Oral   Take 50 mg by mouth every morning.          Marland Kitchen ibuprofen (ADVIL,MOTRIN) 200 MG tablet   Oral   Take 200 mg by mouth every 6 (six) hours as needed. For pain         . mirtazapine (REMERON) 15 MG tablet   Oral   Take 1 tablet (15 mg total) by mouth at bedtime.   30 tablet   2    BP 148/91  Pulse 73  Temp(Src) 97.9 F (36.6 C) (Oral)  SpO2 95% Physical Exam  Nursing note and vitals reviewed. Constitutional: He is oriented to person, place, and time. He appears well-developed and well-nourished.  HENT:  Head: Normocephalic and atraumatic.  Mouth/Throat: Oropharynx is clear and moist.  Eyes: Conjunctivae and EOM are normal. Pupils are equal, round, and reactive to light.  Neck: Normal range of motion.  Cardiovascular: Normal rate, regular rhythm and normal heart sounds.   Pulmonary/Chest: Effort normal and breath  sounds normal.  Musculoskeletal: Normal range of motion.       Left foot: He exhibits tenderness and bony tenderness. He exhibits normal range of motion, no swelling, normal capillary refill, no crepitus, no deformity and no laceration.       Feet:  Left foot TTP over medial heel and plantar fascia, full ROM maintained, strong distal pulse and cap refill, normal gait; no calf tenderness, asymmetry, or palpable cord, negative Homan's sign  Neurological: He is alert and oriented to person, place, and time.  Skin: Skin is warm and dry.  Psychiatric: He has a normal mood and affect.    ED Course  Procedures (including critical care time) Labs Reviewed - No data to display No results found.  1. Plantar fasciitis of left foot     MDM   Pain likely plantar fasciitis and exacerbated by new shoes along with prolonged standing/walking activities.  Patient expresses concern for blood clot of his foot-I have  explained that this is unlikely, and clots usually come from the deep veins of the calf.  The foot was wrapped with Ace wrap. Patient given instructions on plantar fasciitis and stretching activities. Advised he may wish to place inserts in his shoes to help with symptoms. Rx tramadol. Discussed plan with patient, he agreed. Return precautions advised.   Consulted Dr. Juleen China who personally evaluated patient and again reassured him that blood clot in his foot is highly unlikely- Dr agrees with dx and plan.  Garlon Hatchet, PA-C 12/28/12 0720

## 2012-12-27 NOTE — ED Notes (Signed)
Pt reports pain to inner aspect of left foot since Monday. Describes pain as burning sensation. Pt ambulatory with steady gait, alert, oriented x4, NAD at present.

## 2012-12-29 NOTE — ED Provider Notes (Signed)
Medical screening examination/treatment/procedure(s) were performed by non-physician practitioner and as supervising physician I was immediately available for consultation/collaboration.  Jadalynn Burr, MD 12/29/12 1705 

## 2013-03-06 ENCOUNTER — Encounter (HOSPITAL_COMMUNITY): Payer: Self-pay | Admitting: Emergency Medicine

## 2013-03-06 ENCOUNTER — Emergency Department (HOSPITAL_COMMUNITY)
Admission: EM | Admit: 2013-03-06 | Discharge: 2013-03-06 | Disposition: A | Discharge status: Home / Self Care | Payer: BC Managed Care – PPO | Attending: Emergency Medicine | Admitting: Emergency Medicine

## 2013-03-06 DIAGNOSIS — F329 Major depressive disorder, single episode, unspecified: Secondary | ICD-10-CM | POA: Insufficient documentation

## 2013-03-06 DIAGNOSIS — F3289 Other specified depressive episodes: Secondary | ICD-10-CM | POA: Insufficient documentation

## 2013-03-06 DIAGNOSIS — R51 Headache: Secondary | ICD-10-CM | POA: Insufficient documentation

## 2013-03-06 DIAGNOSIS — I10 Essential (primary) hypertension: Secondary | ICD-10-CM | POA: Insufficient documentation

## 2013-03-06 DIAGNOSIS — F419 Anxiety disorder, unspecified: Secondary | ICD-10-CM

## 2013-03-06 DIAGNOSIS — Z79899 Other long term (current) drug therapy: Secondary | ICD-10-CM | POA: Insufficient documentation

## 2013-03-06 DIAGNOSIS — F17200 Nicotine dependence, unspecified, uncomplicated: Secondary | ICD-10-CM | POA: Insufficient documentation

## 2013-03-06 DIAGNOSIS — F411 Generalized anxiety disorder: Secondary | ICD-10-CM | POA: Insufficient documentation

## 2013-03-06 DIAGNOSIS — M545 Low back pain, unspecified: Secondary | ICD-10-CM | POA: Insufficient documentation

## 2013-03-06 DIAGNOSIS — Z7982 Long term (current) use of aspirin: Secondary | ICD-10-CM | POA: Insufficient documentation

## 2013-03-06 MED ORDER — IBUPROFEN 600 MG PO TABS: 600.0000 mg | ORAL_TABLET | Freq: Four times a day (QID) | ORAL | Status: DC | PRN

## 2013-03-06 NOTE — ED Notes (Signed)
Pt c/o generalized back pain and HA x 3 days; pt sts 2 spots wants "to have looked at"

## 2013-03-06 NOTE — ED Provider Notes (Signed)
CSN: [id number]     Arrival date & time 03/06/13  1128 History   First MD Initiated Contact with Patient 03/06/13 1301     Chief Complaint  Patient presents with  . Headache  . Back Pain   (Consider location/radiation/quality/duration/timing/severity/associated sxs/prior Treatment) HPI Pt presenting with c/o concern for 2 brown skin lesions- one on scalp and one on left hip which have been present for 3 years.  He states that he became concerned that these lesions were cancerous.  He admits to having issues with anxiety and found that once he started thinking of this he decided to come to the ED to see if the skin lesions were cancerous.  He also c/o pain in foot due to plantar fasciitis and also has had some right lower back pain.  Pain is worse with movement and palpation.  There are no other associated systemic symptoms, there are no other alleviating or modifying factors.   Past Medical History  Diagnosis Date  . Anxiety   . Hypertension   . Depression    Past Surgical History  Procedure Laterality Date  . Tonsillectomy     Family History  Problem Relation Age of Onset  . Anxiety disorder Mother   . Depression Mother   . Anxiety disorder Sister   . Depression Sister   . Anxiety disorder Brother   . Depression Brother    History  Substance Use Topics  . Smoking status: Current Every Day Smoker -- 0.25 packs/day    Types: Cigarettes  . Smokeless tobacco: Never Used  . Alcohol Use: No    Review of Systems ROS reviewed and all otherwise negative except for mentioned in HPI  Allergies  Demerol; Erythromycin; and Sulfa antibiotics  Home Medications   Current Outpatient Rx  Name  Route  Sig  Dispense  Refill  . ALPRAZolam (XANAX) 1 MG tablet   Oral   Take 1 tablet (1 mg total) by mouth 4 (four) times daily. May also take 1/2 tablet daily as needed   150 tablet   2     RX Clarified 10/08/12.Not a new RX   . aspirin EC 325 MG tablet   Oral   Take 325 mg by mouth  daily.         Marland Kitchen atenolol (TENORMIN) 50 MG tablet   Oral   Take 50 mg by mouth every morning.          Marland Kitchen ibuprofen (ADVIL,MOTRIN) 200 MG tablet   Oral   Take 200 mg by mouth every 8 (eight) hours as needed for pain.         . mirtazapine (REMERON) 15 MG tablet   Oral   Take 1 tablet (15 mg total) by mouth at bedtime.   30 tablet   2   . ibuprofen (ADVIL,MOTRIN) 600 MG tablet   Oral   Take 1 tablet (600 mg total) by mouth every 6 (six) hours as needed for pain.   30 tablet   0    BP 138/80  Pulse 62  Temp(Src) 97 F (36.1 C) (Oral)  Resp 16  SpO2 99% Vitals reviewed Physical Exam Physical Examination: General appearance - alert, well appearing, and in no distress Mental status - alert, oriented to person, place, and time Mouth - mucous membranes moist, pharynx normal without lesions Chest - clear to auscultation, no wheezes, rales or rhonchi, symmetric air entry Heart - normal rate, regular rhythm, normal S1, S2, no murmurs, rubs, clicks or gallops Abdomen -  soft, nontender, nondistended, no masses or organomegaly Back exam - full range of motion, no midline c/t/l tenderness, some ttp over right lumbar paraspinous muscles, no CVA tenderness Neurological - alert, oriented x 3, strength 5/5 in extremities x 4, sensation intact, cranial nerves grossly intact, gait normal Extremities - peripheral pulses normal, no pedal edema, no clubbing or cyanosis Skin - normal coloration and turgor, no rashes, 2 brown appearing macular skin lesions overlying right scalp and left hip Psych- anxious, but pleasant and cooperative  ED Course  Procedures (including critical care time) Labs Review Labs Reviewed - No data to display Imaging Review No results found.  MDM   1. Anxiety   2. Low back pain    Pt presenting with anxiety about 2 skin lesions that have been present for 3 years and requests to have them checked for cancer- also c/o low back pain.  I offered an xray due to  age over 28 and no hx of back pain, but patient declined stating he needed to get to work.  I had a long discussion with him about having a skin check done by either his primary care doctor, or possibly dermatologist.  Discharged with strict return precautions.  Pt agreeable with plan.   Ethelda Chick, MD 03/06/13 [phone]

## 2013-03-26 ENCOUNTER — Ambulatory Visit (INDEPENDENT_AMBULATORY_CARE_PROVIDER_SITE_OTHER): Payer: BC Managed Care – PPO | Admitting: Physician Assistant

## 2013-03-26 ENCOUNTER — Encounter (HOSPITAL_COMMUNITY): Payer: Self-pay

## 2013-03-26 ENCOUNTER — Encounter (HOSPITAL_COMMUNITY): Payer: Self-pay | Admitting: Physician Assistant

## 2013-03-26 VITALS — BP 130/70 | HR 82 | Ht 71.75 in | Wt 193.2 lb

## 2013-03-26 DIAGNOSIS — F411 Generalized anxiety disorder: Secondary | ICD-10-CM

## 2013-03-26 MED ORDER — MIRTAZAPINE 15 MG PO TABS: 15.0000 mg | ORAL_TABLET | Freq: Every day | ORAL | Status: DC

## 2013-03-26 MED ORDER — ALPRAZOLAM 1 MG PO TABS: ORAL_TABLET | ORAL | Status: DC

## 2013-03-26 NOTE — Addendum Note (Signed)
Addended by: Yolande Jolly on: 03/26/2013 01:56 PM   Modules accepted: Orders

## 2013-03-26 NOTE — Progress Notes (Signed)
   [facility] Follow-up Outpatient Visit  Roger [name] [birth year]  Date: 03/26/2013   Subjective: [name] presents today to followup on his treatment for anxiety. He reports that things at school are "going rough," because he didn't take a vacation this summer. He continues to perseverate and ruminate regarding excess scheduled matters. He endorses good sleep and appetite. He denies any suicidal or homicidal ideation. He denies any auditory or visual hallucinations.  Filed Vitals:   03/26/13 1330  BP: 130/70  Pulse: 82    Mental Status Examination  Appearance: Casual Alert: Yes Attention: good  Cooperative: Yes Eye Contact: Good Speech: Clear and coherent Psychomotor Activity: Normal Memory/Concentration: Intact Oriented: person, place, time/date and situation Mood: Anxious Affect: Congruent Thought Processes and Associations: Linear Fund of Knowledge: Good Thought Content: Normal Insight: Good Judgement: Good  Diagnosis: Generalized anxiety disorder  Treatment Plan: We'll continue his Xanax 1 mg up to 5 times daily, and Remeron 15 mg at bedtime. He will return for followup in 3 months.  Lecia Esperanza, PA-C

## 2013-03-27 ENCOUNTER — Emergency Department (INDEPENDENT_AMBULATORY_CARE_PROVIDER_SITE_OTHER)
Admission: EM | Admit: 2013-03-27 | Discharge: 2013-03-27 | Disposition: A | Discharge status: Home / Self Care | Payer: BC Managed Care – PPO | Source: Home / Self Care | Attending: Family Medicine | Admitting: Family Medicine

## 2013-03-27 ENCOUNTER — Encounter (HOSPITAL_COMMUNITY): Payer: Self-pay

## 2013-03-27 ENCOUNTER — Emergency Department (INDEPENDENT_AMBULATORY_CARE_PROVIDER_SITE_OTHER): Payer: BC Managed Care – PPO

## 2013-03-27 ENCOUNTER — Emergency Department (HOSPITAL_COMMUNITY): Payer: Self-pay

## 2013-03-27 DIAGNOSIS — S8990XA Unspecified injury of unspecified lower leg, initial encounter: Secondary | ICD-10-CM

## 2013-03-27 DIAGNOSIS — S99921A Unspecified injury of right foot, initial encounter: Secondary | ICD-10-CM

## 2013-03-27 MED ORDER — IBUPROFEN 600 MG PO TABS: 600.0000 mg | ORAL_TABLET | Freq: Four times a day (QID) | ORAL | Status: DC | PRN

## 2013-03-27 NOTE — ED Provider Notes (Signed)
CSN: [id number]     Arrival date & time 03/27/13  1715 History   First MD Initiated Contact with Patient 03/27/13 1837     Chief Complaint  Patient presents with  . Foot Injury   (Consider location/radiation/quality/duration/timing/severity/associated sxs/prior Treatment) HPI Comments: 57 yo male injured right foot at work this afternoon when desk chairs fell on to it. Complains of pain increasing with weight bearing and movement. 7/10 pain currently but does not want pain medicine.   Past Medical History  Diagnosis Date  . Anxiety   . Hypertension   . Depression    Past Surgical History  Procedure Laterality Date  . Tonsillectomy     Family History  Problem Relation Age of Onset  . Anxiety disorder Mother   . Depression Mother   . Anxiety disorder Sister   . Depression Sister   . Anxiety disorder Brother   . Depression Brother    History  Substance Use Topics  . Smoking status: Current Every Day Smoker -- 0.25 packs/day    Types: Cigarettes  . Smokeless tobacco: Never Used  . Alcohol Use: No    Review of Systems  Constitutional: Negative.   Respiratory: Negative.   Cardiovascular: Negative.   Musculoskeletal: Positive for myalgias.       Right foot pain  Skin: Negative.   Neurological: Negative.   Hematological: Negative.     Allergies  Demerol; Erythromycin; and Sulfa antibiotics  Home Medications   Current Outpatient Rx  Name  Route  Sig  Dispense  Refill  . ALPRAZolam (XANAX) 1 MG tablet      Take one tablet up to five times daily   150 tablet   2   . aspirin EC 325 MG tablet   Oral   Take 325 mg by mouth daily.         Marland Kitchen atenolol (TENORMIN) 50 MG tablet   Oral   Take 50 mg by mouth every morning.          Marland Kitchen ibuprofen (ADVIL,MOTRIN) 600 MG tablet   Oral   Take 1 tablet (600 mg total) by mouth every 6 (six) hours as needed for pain.   30 tablet   0   . mirtazapine (REMERON) 15 MG tablet   Oral   Take 1 tablet (15 mg total) by mouth  at bedtime.   30 tablet   2   . ibuprofen (ADVIL,MOTRIN) 600 MG tablet   Oral   Take 1 tablet (600 mg total) by mouth every 6 (six) hours as needed for pain.   30 tablet   0    BP 135/75  Pulse 56  Temp(Src) 97.3 F (36.3 C) (Oral)  Resp 18  SpO2 96% Physical Exam  Nursing note and vitals reviewed. Constitutional: He is oriented to person, place, and time. He appears well-developed and well-nourished.  Cardiovascular: Normal rate, regular rhythm, normal heart sounds and intact distal pulses.   Pulmonary/Chest: Effort normal and breath sounds normal.  Musculoskeletal:  Right foot medial tenderness at base of great toe and medial aspect of mid tarsals. Pain with extension/ flexion with mild decreased ROM.  Neurological: He is alert and oriented to person, place, and time. He has normal reflexes.  Nerves intact on Right foot with negative neurological exam  Skin: Skin is warm and dry.  No ecchymosis present    ED Course  Procedures (including critical care time) Labs Review Labs Reviewed - No data to display Imaging Review Dg Foot Complete Right  03/27/2013   CLINICAL DATA:  Pain post trauma  EXAM: RIGHT FOOT COMPLETE - 3+ VIEW  COMPARISON:  None.  FINDINGS: Frontal, oblique, and lateral views were obtained. There is no fracture or dislocation. 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 Other joint spaces appear normal. No erosive change.  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 No fracture or dislocation.   Electronically Signed   By: Bretta Bang   On: 03/27/2013 18:41    MDM  Foot strain with negative xray. Advised elevate/ Ice/ Rest. Hard sole shoe or post op shoe recommended.   Ibuprofen 600mg  1 po q8hours AD. F/U if sx increase. OOW x 2days  Berenice Primas, PA-C 03/27/13 1927

## 2013-03-27 NOTE — ED Provider Notes (Signed)
Medical screening examination/treatment/procedure(s) were performed by resident physician or non-physician practitioner and as supervising physician I was immediately available for consultation/collaboration.   KINDL,JAMES DOUGLAS MD.   James D Kindl, MD 03/27/13 2042 

## 2013-03-27 NOTE — ED Notes (Signed)
States he sustained crush type injury to right foot while moving a desk at work earlier today

## 2013-06-13 ENCOUNTER — Telehealth (HOSPITAL_COMMUNITY): Payer: Self-pay | Admitting: *Deleted

## 2013-06-13 NOTE — Telephone Encounter (Signed)
Attempted to call patient as a message was left on 06/12/13 asking for a return call. No answer.

## 2013-06-17 ENCOUNTER — Telehealth (HOSPITAL_COMMUNITY): Payer: Self-pay | Admitting: *Deleted

## 2013-06-17 NOTE — Telephone Encounter (Signed)
Called stating he wants to speak with [facility] about refills on his Xanax and Remeron. When told they could be refilled today with enough until his next appointment patient states he would rather call back tomorrow to speak with [facility] who knows his case better to give him 3 refills. No further action required at this time.

## 2013-06-21 ENCOUNTER — Other Ambulatory Visit (HOSPITAL_COMMUNITY): Payer: Self-pay | Admitting: Psychiatry

## 2013-06-21 ENCOUNTER — Telehealth (HOSPITAL_COMMUNITY): Payer: Self-pay

## 2013-06-24 ENCOUNTER — Ambulatory Visit (HOSPITAL_COMMUNITY): Payer: Self-pay | Admitting: Psychiatry

## 2013-06-24 ENCOUNTER — Ambulatory Visit (HOSPITAL_COMMUNITY): Payer: Self-pay | Admitting: Physician Assistant

## 2013-06-24 NOTE — Telephone Encounter (Signed)
Chart reviewed, refill not appropriate at this time. Pt will be seen 06/25/13 by Arfeen, refill will be given at appointment.

## 2013-06-25 ENCOUNTER — Ambulatory Visit (INDEPENDENT_AMBULATORY_CARE_PROVIDER_SITE_OTHER): Payer: BC Managed Care – PPO | Admitting: Psychiatry

## 2013-06-25 ENCOUNTER — Encounter (HOSPITAL_COMMUNITY): Payer: Self-pay | Admitting: Psychiatry

## 2013-06-25 VITALS — BP 147/88 | HR 63 | Ht 71.75 in | Wt 191.2 lb

## 2013-06-25 DIAGNOSIS — F411 Generalized anxiety disorder: Secondary | ICD-10-CM

## 2013-06-25 MED ORDER — ALPRAZOLAM 1 MG PO TABS: ORAL_TABLET | ORAL | Status: DC

## 2013-06-25 MED ORDER — MIRTAZAPINE 15 MG PO TABS: 15.0000 mg | ORAL_TABLET | Freq: Every day | ORAL | Status: DC

## 2013-06-25 NOTE — Progress Notes (Signed)
[facility] [address] Progress Note  Roger [name] [id number] 57 y.o.  06/25/2013 11:58 AM  Chief Complaint: I want to continue my medication.  History of Present Illness: Patient is 57 year old Caucasian employed man who came for his appointment.  He's been seen in this office since February 2013.  He finished intensive outpatient program in 2013 and then continued to see a provider in this office.  He was seeing physician assistant who has left the practice.  Patient has diagnoses of generalized anxiety disorder.  He is taking Xanax 1 mg 5 times a day and Remeron 15 mg at bedtime.  Patient is feeling better on his medication.  He denies abusing his benzodiazepine.  He never asked for early refills.  He is not drinking or using any illegal substances.  He continues to have anxiety and nervousness however his symptoms are less intense and less frequent.  He denies any agitation, anger, mood swings, insomnia or any hallucinations.  He lives with his sister.  He is working as a Arboriculturist in school.  He likes his job.  He has no children.  He had a panic attack.  He sees his primary care physician for his regular needs.  Is also seeing counselor Forde Radon at [facility].  Patient does not drink or use any illegal substances.  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 Suicidal Ideation: No Plan Formed: No Patient has means to carry out plan: No   Homicidal Ideation: No Plan Formed: No Patient has means to carry out plan: No  Review of Systems: Psychiatric: Agitation: No Hallucination: No Depressed Mood: No Insomnia: No Hypersomnia: No Altered Concentration: No Feels Worthless: No Grandiose Ideas: No Belief In Special Powers: No New/Increased Substance Abuse: No Compulsions: No  Neurologic: Headache: No Seizure: No Paresthesias: No  Past Medical Family, Social History: The patient has hypertension and he see Dr. Dyann Kief at [place] [facility].  Lives with  his sister.  He has no children.  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  Outpatient Encounter Prescriptions as of 06/25/2013  Medication Sig  . ALPRAZolam (XANAX) 1 MG tablet Take one tablet up to five times daily  . aspirin EC 325 MG tablet Take 325 mg by mouth daily.  Marland Kitchen atenolol (TENORMIN) 50 MG tablet Take 50 mg by mouth every morning.   Marland Kitchen ibuprofen (ADVIL,MOTRIN) 600 MG tablet Take 1 tablet (600 mg total) by mouth every 6 (six) hours as needed for pain.  . mirtazapine (REMERON) 15 MG tablet Take 1 tablet (15 mg total) by mouth at bedtime.  . [DISCONTINUED] ALPRAZolam (XANAX) 1 MG tablet Take one tablet up to five times daily  . [DISCONTINUED] mirtazapine (REMERON) 15 MG tablet Take 1 tablet (15 mg total) by mouth at bedtime.  . [DISCONTINUED] ibuprofen (ADVIL,MOTRIN) 600 MG tablet Take 1 tablet (600 mg total) by mouth every 6 (six) hours as needed for pain.    Past Psychiatric History/Hospitalization(s): Patient denies any inpatient psychiatric treatment.  He done an intensive outpatient program in 2013.  At that time he has multiple losses in his life including his business and home. Anxiety: Yes Bipolar Disorder: No Depression: Yes Mania: No Psychosis: No Schizophrenia: No Personality Disorder: No Hospitalization for psychiatric illness: No History of Electroconvulsive Shock Therapy: No Prior Suicide Attempts: No  Physical Exam: Constitutional:  BP 147/88  Pulse 63  Ht 5' 11.75" (1.822 m)  Wt 191 lb 3.2 oz (86.728 kg)  BMI 26.13  kg/m2  General Appearance: well nourished  Musculoskeletal: Strength & Muscle Tone: within normal limits Gait & Station: normal Patient leans: N/A  Psychiatric: Speech (describe rate, volume, coherence, spontaneity, and abnormalities if any): Clear and coherent.  Normal tone and volume.  Thought Process (describe rate, content, abstract reasoning, and computation): Logical and goal directed.  Associations: Intact  Thoughts:  normal  Mental Status: Orientation: oriented to person, place, time/date, situation, day of week, month of year and year Mood & Affect: anxiety Attention Span & Concentration: Fair  Medical Decision Making (Choose Three): Established Problem, Stable/Improving (1), Review of Psycho-Social Stressors (1), Review or order clinical lab tests (1), Decision to obtain old records (1), Review and summation of old records (2) and Review of Medication Regimen & Side Effects (2)  Assessment: Axis I: Generalized anxiety disorder  Axis II: Deferred  Axis III: See medical history  Axis IV: Mild  Axis V: 75-80   Plan: I reviewed his symptoms, psychosocial history, collateral information and his current medication.  Patient is doing better on Xanax 1 mg 5 times a day and Remeron 15 mg at bedtime.  I explained that he is taking a very high dose of benzodiazepine.  We talk about reducing his Xanax on his next appointment .  Patient does not want to change his medication before Christmas.  Discussed in detail benzodiazepine withdrawals, problems, dependency .  We will get his blood work and collateral information from his primary care physician.  Followup in 3 months.  Time spent 25 minutes.  More than 50% of the time spent in psychoeducation, counseling and coordination of care.  Discuss safety plan that anytime having active suicidal thoughts or homicidal thoughts then patient need to call 911 or go to the local emergency room.    Anida Deol T., MD 06/25/2013

## 2013-07-17 ENCOUNTER — Emergency Department (HOSPITAL_COMMUNITY)
Admission: EM | Admit: 2013-07-17 | Discharge: 2013-07-17 | Disposition: A | Discharge status: Home / Self Care | Payer: BC Managed Care – PPO | Attending: Emergency Medicine | Admitting: Emergency Medicine

## 2013-07-17 ENCOUNTER — Emergency Department (HOSPITAL_COMMUNITY): Payer: BC Managed Care – PPO

## 2013-07-17 ENCOUNTER — Encounter (HOSPITAL_COMMUNITY): Payer: Self-pay | Admitting: Emergency Medicine

## 2013-07-17 DIAGNOSIS — F3289 Other specified depressive episodes: Secondary | ICD-10-CM | POA: Insufficient documentation

## 2013-07-17 DIAGNOSIS — R109 Unspecified abdominal pain: Secondary | ICD-10-CM | POA: Insufficient documentation

## 2013-07-17 DIAGNOSIS — I10 Essential (primary) hypertension: Secondary | ICD-10-CM | POA: Insufficient documentation

## 2013-07-17 DIAGNOSIS — F329 Major depressive disorder, single episode, unspecified: Secondary | ICD-10-CM | POA: Insufficient documentation

## 2013-07-17 DIAGNOSIS — F17200 Nicotine dependence, unspecified, uncomplicated: Secondary | ICD-10-CM | POA: Insufficient documentation

## 2013-07-17 DIAGNOSIS — F411 Generalized anxiety disorder: Secondary | ICD-10-CM | POA: Insufficient documentation

## 2013-07-17 LAB — COMPREHENSIVE METABOLIC PANEL
ALT: 18 U/L (ref 0–53)
AST: 24 U/L (ref 0–37)
Albumin: 3.8 g/dL (ref 3.5–5.2)
Alkaline Phosphatase: 112 U/L (ref 39–117)
BUN: 11 mg/dL (ref 6–23)
CALCIUM: 9.3 mg/dL (ref 8.4–10.5)
CO2: 23 mEq/L (ref 19–32)
Chloride: 101 mEq/L (ref 96–112)
Creatinine, Ser: 0.77 mg/dL (ref 0.50–1.35)
GLUCOSE: 134 mg/dL — AB (ref 70–99)
Potassium: 3.8 mEq/L (ref 3.7–5.3)
Sodium: 137 mEq/L (ref 137–147)
Total Bilirubin: 0.5 mg/dL (ref 0.3–1.2)
Total Protein: 7.4 g/dL (ref 6.0–8.3)

## 2013-07-17 LAB — CBC
HCT: 44 % (ref 39.0–52.0)
HEMOGLOBIN: 15.5 g/dL (ref 13.0–17.0)
MCH: 32.7 pg (ref 26.0–34.0)
MCHC: 35.2 g/dL (ref 30.0–36.0)
MCV: 92.8 fL (ref 78.0–100.0)
Platelets: 203 10*3/uL (ref 150–400)
RBC: 4.74 MIL/uL (ref 4.22–5.81)
RDW: 12.4 % (ref 11.5–15.5)
WBC: 8.4 10*3/uL (ref 4.0–10.5)

## 2013-07-17 LAB — POCT I-STAT TROPONIN I: TROPONIN I, POC: 0.01 ng/mL (ref 0.00–0.08)

## 2013-07-17 NOTE — Discharge Instructions (Signed)
Take Colace twice a day for 2 weeks, to improve your bowel movements. Return here if your pain worsens. See, your doctor if the pain is still there next week.   Abdominal Pain, Adult Many things can cause abdominal pain. Usually, abdominal pain is not caused by a disease and will improve without treatment. It can often be observed and treated at home. Your health care provider will do a physical exam and possibly order blood tests and X-rays to help determine the seriousness of your pain. However, in many cases, more time must pass before a clear cause of the pain can be found. Before that point, your health care provider may not know if you need more testing or further treatment. HOME CARE INSTRUCTIONS  Monitor your abdominal pain for any changes. The following actions may help to alleviate any discomfort you are experiencing:  Only take over-the-counter or prescription medicines as directed by your health care provider.  Do not take laxatives unless directed to do so by your health care provider.  Try a clear liquid diet (broth, tea, or water) as directed by your health care provider. Slowly move to a bland diet as tolerated. SEEK MEDICAL CARE IF:  You have unexplained abdominal pain.  You have abdominal pain associated with nausea or diarrhea.  You have pain when you urinate or have a bowel movement.  You experience abdominal pain that wakes you in the night.  You have abdominal pain that is worsened or improved by eating food.  You have abdominal pain that is worsened with eating fatty foods. SEEK IMMEDIATE MEDICAL CARE IF:   Your pain does not go away within 2 hours.  You have a fever.  You keep throwing up (vomiting).  Your pain is felt only in portions of the abdomen, such as the right side or the left lower portion of the abdomen.  You pass bloody or black tarry stools. MAKE SURE YOU:  Understand these instructions.   Will watch your condition.   Will get help  right away if you are not doing well or get worse.  Document Released: 03/30/2005 Document Revised: 04/10/2013 Document Reviewed: 02/27/2013 [facility] Patient Information 2014 [place].

## 2013-07-17 NOTE — ED Notes (Signed)
Palpitations that started yesterday comes and goes no n/v/sob pt aaox4

## 2013-07-17 NOTE — ED Notes (Signed)
Dr. Eulis Foster speaking to pt at bedside.

## 2013-07-17 NOTE — ED Provider Notes (Signed)
CSN: [id number]     Arrival date & time 07/17/13  1343 History   First MD Initiated Contact with Patient 07/17/13 1619     Chief Complaint  Patient presents with  . Palpitations   (Consider location/radiation/quality/duration/timing/severity/associated sxs/prior Treatment) HPI Comments: Roger [name] is a 58 y.o. male who presents for evaluation of a "fluttering" sensation in his left upper abdomen. The discomfort started several days ago. He has had several different episodes, without any known provocation. The episodes lasted much is a few seconds, never longer. The pain has not awakened him from sleep. He is able to work without discomfort. He has never had this previously. He is using his usual medications, without relief. He recently saw his PCP, for a routine physical. He has had decreased frequency of movements in the last week. There's been no nausea, vomiting, diarrhea, blood in stool, change in urinary habits or trouble walking. There no other known modifying factors.  Patient is a 58 y.o. male presenting with palpitations. The history is provided by the patient.  Palpitations   Past Medical History  Diagnosis Date  . Anxiety   . Hypertension   . Depression    Past Surgical History  Procedure Laterality Date  . Tonsillectomy     Family History  Problem Relation Age of Onset  . Anxiety disorder Mother   . Depression Mother   . Anxiety disorder Sister   . Depression Sister   . Anxiety disorder Brother   . Depression Brother    History  Substance Use Topics  . Smoking status: Current Every Day Smoker -- 0.25 packs/day    Types: Cigarettes  . Smokeless tobacco: Never Used  . Alcohol Use: No    Review of Systems  Cardiovascular: Positive for palpitations.  All other systems reviewed and are negative.    Allergies  Demerol; Erythromycin; and Sulfa antibiotics  Home Medications   Current Outpatient Rx  Name  Route  Sig  Dispense  Refill  . ALPRAZolam (XANAX) 1  MG tablet   Oral   Take 1 mg by mouth 4 (four) times daily.         Marland Kitchen aspirin EC 325 MG tablet   Oral   Take 325 mg by mouth daily.         Marland Kitchen atenolol (TENORMIN) 50 MG tablet   Oral   Take 50 mg by mouth every morning.          Marland Kitchen ibuprofen (ADVIL,MOTRIN) 200 MG tablet   Oral   Take 400 mg by mouth every 6 (six) hours as needed (pain).         . mirtazapine (REMERON) 15 MG tablet   Oral   Take 1 tablet (15 mg total) by mouth at bedtime.   30 tablet   2    BP 134/66  Pulse 60  Temp(Src) 97.4 F (36.3 C) (Oral)  Resp 16  Wt 190 lb (86.183 kg)  SpO2 95% Physical Exam  Nursing note and vitals reviewed. Constitutional: He is oriented to person, place, and time. He appears well-developed and well-nourished. No distress.  HENT:  Head: Normocephalic and atraumatic.  Right Ear: External ear normal.  Left Ear: External ear normal.  Eyes: Conjunctivae and EOM are normal. Pupils are equal, round, and reactive to light.  Neck: Normal range of motion and phonation normal. Neck supple.  Cardiovascular: Normal rate, regular rhythm, normal heart sounds and intact distal pulses.   Pulmonary/Chest: Effort normal and breath sounds normal. He  exhibits no bony tenderness.  Abdominal: Soft. Normal appearance. He exhibits no distension. There is no tenderness. There is no guarding.  There are normal palpable abdominal pulsations of the aorta, and there is no aortic tenderness. The area of discomfort, that he calls "fluttering", is in the lateral left upper abdomen. There is no palpable mass, or tenderness, in this region.  Musculoskeletal: Normal range of motion.  Neurological: He is alert and oriented to person, place, and time. No cranial nerve deficit or sensory deficit. He exhibits normal muscle tone. Coordination normal.  Skin: Skin is warm, dry and intact.  Psychiatric: He has a normal mood and affect. His behavior is normal. Judgment and thought content normal.    ED Course   Procedures (including critical care time) Labs Review Labs Reviewed  COMPREHENSIVE METABOLIC PANEL - Abnormal; Notable for the following:    Glucose, Bld 134 (*)    All other components within normal limits  CBC  POCT I-STAT TROPONIN I   Imaging Review Dg Chest 2 View  07/17/2013   CLINICAL DATA:  Palpitation and left-sided shoulder in chest discomfort  EXAM: CHEST  2 VIEW  COMPARISON:  July 18, 2012  FINDINGS: The lungs are well-expanded. There is no focal infiltrate. There is no pleural effusion or pneumothorax. The cardiopericardial silhouette is normal in size. The mediastinum is normal in width. The observed portions of the bony thorax appear normal.  IMPRESSION: There is no evidence of pneumonia nor CHF nor other acute cardiopulmonary abnormality.   Electronically Signed   By: David  Martinique   On: 07/17/2013 15:06    EKG Interpretation    Date/Time:  Wednesday July 17 2013 13:46:27 EST Ventricular Rate:  64 PR Interval:  136 QRS Duration: 98 QT Interval:  392 QTC Calculation: 404 R Axis:   66 Text Interpretation:  Normal sinus rhythm Normal ECG since last tracing no significant change Confirmed by Kyisha Fowle  MD, Climmie Buelow ([id number]) on 07/17/2013 4:48:22 PM            MDM   1. Abdominal pain    Nonspecific transient abdominal discomfort, not present on examination. Screening evaluation for cardiac, urinary, pulmonary problems; all negative. It is very unlikely this represents ACS, PE, or pneumonia. There is no good evidence for intra-abdominal abnormalities. It is possible that he has irregularity contributing to his discomfort.  Nursing Notes Reviewed/ Care Coordinated Applicable Imaging Reviewed Interpretation of Laboratory Data incorporated into ED treatment  The patient appears reasonably screened and/or stabilized for discharge and I doubt any other medical condition or other [facility] requiring further screening, evaluation, or treatment in the ED at this time prior to  discharge.  Plan: Home Medications- Colace for 2 weeks; Home Treatments- rest; return here if the recommended treatment, does not improve the symptoms; Recommended follow up- PCP, one week, if the discomfort continues. Return here when necessary, for worsening pain.     Richarda Blade, MD 07/17/13 [phone]

## 2013-08-19 ENCOUNTER — Emergency Department (HOSPITAL_COMMUNITY)
Admission: EM | Admit: 2013-08-19 | Discharge: 2013-08-19 | Disposition: A | Discharge status: Home / Self Care | Payer: BC Managed Care – PPO | Attending: Emergency Medicine | Admitting: Emergency Medicine

## 2013-08-19 ENCOUNTER — Emergency Department (HOSPITAL_COMMUNITY): Payer: BC Managed Care – PPO

## 2013-08-19 ENCOUNTER — Encounter (HOSPITAL_COMMUNITY): Payer: Self-pay | Admitting: Emergency Medicine

## 2013-08-19 DIAGNOSIS — I10 Essential (primary) hypertension: Secondary | ICD-10-CM | POA: Insufficient documentation

## 2013-08-19 DIAGNOSIS — F411 Generalized anxiety disorder: Secondary | ICD-10-CM | POA: Insufficient documentation

## 2013-08-19 DIAGNOSIS — Z7982 Long term (current) use of aspirin: Secondary | ICD-10-CM | POA: Insufficient documentation

## 2013-08-19 DIAGNOSIS — R042 Hemoptysis: Secondary | ICD-10-CM

## 2013-08-19 DIAGNOSIS — F3289 Other specified depressive episodes: Secondary | ICD-10-CM | POA: Insufficient documentation

## 2013-08-19 DIAGNOSIS — R0789 Other chest pain: Secondary | ICD-10-CM | POA: Insufficient documentation

## 2013-08-19 DIAGNOSIS — J029 Acute pharyngitis, unspecified: Secondary | ICD-10-CM | POA: Insufficient documentation

## 2013-08-19 DIAGNOSIS — Z79899 Other long term (current) drug therapy: Secondary | ICD-10-CM | POA: Insufficient documentation

## 2013-08-19 DIAGNOSIS — J40 Bronchitis, not specified as acute or chronic: Secondary | ICD-10-CM

## 2013-08-19 DIAGNOSIS — F17200 Nicotine dependence, unspecified, uncomplicated: Secondary | ICD-10-CM | POA: Insufficient documentation

## 2013-08-19 DIAGNOSIS — F329 Major depressive disorder, single episode, unspecified: Secondary | ICD-10-CM | POA: Insufficient documentation

## 2013-08-19 DIAGNOSIS — M549 Dorsalgia, unspecified: Secondary | ICD-10-CM | POA: Insufficient documentation

## 2013-08-19 LAB — CBC
HCT: 45.6 % (ref 39.0–52.0)
Hemoglobin: 16.3 g/dL (ref 13.0–17.0)
MCH: 33.3 pg (ref 26.0–34.0)
MCHC: 35.7 g/dL (ref 30.0–36.0)
MCV: 93.3 fL (ref 78.0–100.0)
Platelets: 216 K/uL (ref 150–400)
RBC: 4.89 MIL/uL (ref 4.22–5.81)
RDW: 12.4 % (ref 11.5–15.5)
WBC: 8 K/uL (ref 4.0–10.5)

## 2013-08-19 LAB — POCT I-STAT TROPONIN I: TROPONIN I, POC: 0 ng/mL (ref 0.00–0.08)

## 2013-08-19 LAB — BASIC METABOLIC PANEL WITH GFR
CO2: 27 meq/L (ref 19–32)
Creatinine, Ser: 0.79 mg/dL (ref 0.50–1.35)
GFR calc Af Amer: >90 mL/min (ref >90)

## 2013-08-19 LAB — BASIC METABOLIC PANEL
BUN: 10 mg/dL (ref 6–23)
Calcium: 9.5 mg/dL (ref 8.4–10.5)
Chloride: 102 mEq/L (ref 96–112)
GFR calc non Af Amer: >90 mL/min (ref >90)
Glucose, Bld: 159 mg/dL — ABNORMAL HIGH (ref 70–99)
Potassium: 4.2 mEq/L (ref 3.7–5.3)
Sodium: 141 mEq/L (ref 137–147)

## 2013-08-19 MED ORDER — ALBUTEROL SULFATE HFA 108 (90 BASE) MCG/ACT IN AERS
1.0000 | INHALATION_SPRAY | Freq: Four times a day (QID) | RESPIRATORY_TRACT | Status: DC | PRN
  Administered 2013-08-19: 1 via RESPIRATORY_TRACT
  Filled 2013-08-19: qty 6.7

## 2013-08-19 NOTE — ED Provider Notes (Signed)
CSN: [id number]     Arrival date & time 08/19/13  2694 History   First MD Initiated Contact with Patient 08/19/13 1111     Chief Complaint  Patient presents with  . Hemoptysis  . Chest Pain  . Back Pain     (Consider location/radiation/quality/duration/timing/severity/associated sxs/prior Treatment) HPI Comments: Pt with h/o smoking about 1/4 PPD, has anxiety, has been coughing with post nasal drip, slight dry scratchy throat for about 1 week, began having sputum clear with blood tinge to it consistently since 2.5 days ago.  Reflex to cough is gone, but when he makes himself cough, continues to see blood.  Endorses chest and back soreness, no fevers, chills.  No recent travel, no calf tenderness or swelling.  No prior h/o COPD, emphysema.  No dizziness, feeling light headed.    Patient is a 58 y.o. male presenting with chest pain and back pain. The history is provided by the patient.  Chest Pain Associated symptoms: back pain and cough   Associated symptoms: no abdominal pain, no fever, no nausea and not vomiting   Back Pain Associated symptoms: chest pain   Associated symptoms: no abdominal pain and no fever     Past Medical History  Diagnosis Date  . Anxiety   . Hypertension   . Depression    Past Surgical History  Procedure Laterality Date  . Tonsillectomy     Family History  Problem Relation Age of Onset  . Anxiety disorder Mother   . Depression Mother   . Anxiety disorder Sister   . Depression Sister   . Anxiety disorder Brother   . Depression Brother    History  Substance Use Topics  . Smoking status: Current Every Day Smoker -- 0.25 packs/day    Types: Cigarettes  . Smokeless tobacco: Never Used  . Alcohol Use: No    Review of Systems  Constitutional: Negative for fever, chills and appetite change.  HENT: Positive for congestion, postnasal drip and sore throat.   Respiratory: Positive for cough and chest tightness.   Cardiovascular: Positive for chest  pain.  Gastrointestinal: Negative for nausea, vomiting and abdominal pain.  Musculoskeletal: Positive for back pain.  Skin: Negative for rash and wound.  Neurological: Negative for syncope and light-headedness.  All other systems reviewed and are negative.      Allergies  Demerol; Erythromycin; and Sulfa antibiotics  Home Medications   Current Outpatient Rx  Name  Route  Sig  Dispense  Refill  . ALPRAZolam (XANAX) 1 MG tablet   Oral   Take 1 mg by mouth 4 (four) times daily.         Marland Kitchen aspirin EC 325 MG tablet   Oral   Take 325 mg by mouth daily.         Marland Kitchen atenolol (TENORMIN) 50 MG tablet   Oral   Take 50 mg by mouth every morning.          Marland Kitchen ibuprofen (ADVIL,MOTRIN) 200 MG tablet   Oral   Take 400 mg by mouth every 6 (six) hours as needed for moderate pain (pain).          . mirtazapine (REMERON) 15 MG tablet   Oral   Take 1 tablet (15 mg total) by mouth at bedtime.   30 tablet   2    BP 145/68  Pulse 63  Temp(Src) 97.9 F (36.6 C) (Oral)  Resp 20  SpO2 99% Physical Exam  Nursing note and vitals reviewed. Constitutional: He  is oriented to person, place, and time. He appears well-developed and well-nourished.  HENT:  Head: Normocephalic and atraumatic.  Nose: No epistaxis.  Uvula minimally swollen, no airway obstruction, no purulent drainage, no abscess, no trismus, no bleeding or lesions seen  Eyes: Conjunctivae and EOM are normal.  Neck: Normal range of motion. Neck supple.  Cardiovascular: Normal rate, regular rhythm and intact distal pulses.   No murmur heard. Pulmonary/Chest: Effort normal. No respiratory distress. He has no wheezes. He has no rales.  Abdominal: Soft. He exhibits no distension. There is no tenderness.  Neurological: He is alert and oriented to person, place, and time. He exhibits normal muscle tone. Coordination normal.  Skin: Skin is warm and dry. No rash noted. No pallor.  Psychiatric: He has a normal mood and affect.     ED Course  Procedures (including critical care time) Labs Review Labs Reviewed  BASIC METABOLIC PANEL - Abnormal; Notable for the following:    Glucose, Bld 159 (*)    All other components within normal limits  CBC  POCT I-STAT TROPONIN I   Imaging Review Dg Chest 2 View  08/19/2013   CLINICAL DATA:  Hemoptysis.  Chest pain.  Back pain.  EXAM: CHEST  2 VIEW  COMPARISON:  07/17/2013.  07/18/2012.  FINDINGS: There is progression of abnormal interstitial density in the mid and lower lungs bilaterally. This could be due to pneumonitis such as viral pneumonitis or could represent primary interstitial lung disease. No focal consolidation or collapse. No effusions. No bony abnormalities.  IMPRESSION: Progressive abnormal interstitial lung markings. This could be due to infectious pneumonitis or primary interstitial lung disease.   Electronically Signed   By: Nelson Chimes M.D.   On: 08/19/2013 10:34    EKG Interpretation   None      RA sat is 99% and I interpret to be normal   Pt reassured.  Discussed with PCP who agrees and will follow up with pt later this week and recheck him.  Will just treat with inhaler for now.  No abx since no fever , no elevated WBC.    MDM   Final diagnoses:  Bronchitis  Cough with hemoptysis    Pt appears well, small amount of hemotysis, doesn't have reflex to cough, but when he forces a cough, continues to see small amount of blood.  CXR shows possibly viral penumonitis versus primary interstital lung disease.  Will give inhaler and consider oral abx, refer to PCP Dr. Dorthy Cooler at [facility] for close follow up.  Pt encouraged to stop smoking.      Saddie Benders. Ethanael Veith, MD 08/19/13 1205

## 2013-08-19 NOTE — ED Notes (Signed)
Pt states he has been coughing up blood since yesterday, has right chest and back soreness.  Pt denies fever.  Pt is smoker

## 2013-08-19 NOTE — ED Notes (Signed)
MD at bedside. 

## 2013-08-19 NOTE — ED Notes (Signed)
Patient states he has had the cough for three or four weeks, but the sputum has just now shown signs of bright red blood.

## 2013-08-19 NOTE — Discharge Instructions (Signed)
Bronchitis Bronchitis is inflammation of the airways that extend from the windpipe into the lungs (bronchi). The inflammation often causes mucus to develop, which leads to a cough. If the inflammation becomes severe, it may cause shortness of breath. CAUSES  Bronchitis may be caused by:   Viral infections.   Bacteria.   Cigarette smoke.   Allergens, pollutants, and other irritants.  SIGNS AND SYMPTOMS  The most common symptom of bronchitis is a frequent cough that produces mucus. Other symptoms include:  Fever.   Body aches.   Chest congestion.   Chills.   Shortness of breath.   Sore throat.  DIAGNOSIS  Bronchitis is usually diagnosed through a medical history and physical exam. Tests, such as chest X-rays, are sometimes done to rule out other conditions.  TREATMENT  You may need to avoid contact with whatever caused the problem (smoking, for example). Medicines are sometimes needed. These may include:  Antibiotics. These may be prescribed if the condition is caused by bacteria.  Cough suppressants. These may be prescribed for relief of cough symptoms.   Inhaled medicines. These may be prescribed to help open your airways and make it easier for you to breathe.   Steroid medicines. These may be prescribed for those with recurrent (chronic) bronchitis. HOME CARE INSTRUCTIONS  Get plenty of rest.   Drink enough fluids to keep your urine clear or pale yellow (unless you have a medical condition that requires fluid restriction). Increasing fluids may help thin your secretions and will prevent dehydration.   Only take over-the-counter or prescription medicines as directed by your health care provider.  Only take antibiotics as directed. Make sure you finish them even if you start to feel better.  Avoid secondhand smoke, irritating chemicals, and strong fumes. These will make bronchitis worse. If you are a smoker, quit smoking. Consider using nicotine gum or  skin patches to help control withdrawal symptoms. Quitting smoking will help your lungs heal faster.   Put a cool-mist humidifier in your bedroom at night to moisten the air. This may help loosen mucus. Change the water in the humidifier daily. You can also run the hot water in your shower and sit in the bathroom with the door closed for 5 10 minutes.   Follow up with your health care provider as directed.   Wash your hands frequently to avoid catching bronchitis again or spreading an infection to others.  SEEK MEDICAL CARE IF: Your symptoms do not improve after 1 week of treatment.  SEEK IMMEDIATE MEDICAL CARE IF:  Your fever increases.  You have chills.   You have chest pain.   You have worsening shortness of breath.   You have bloody sputum.  You faint.  You have lightheadedness.  You have a severe headache.   You vomit repeatedly. MAKE SURE YOU:   Understand these instructions.  Will watch your condition.  Will get help right away if you are not doing well or get worse. Document Released: 06/20/2005 Document Revised: 04/10/2013 Document Reviewed: 02/12/2013 [facility] Patient Information 2014 [place].    Hemoptysis Hemoptysis, which means coughing up blood, can be a sign of a minor problem or a serious medical condition. The blood that is coughed up may come from the lungs and airways. Coughed-up blood can also come from bleeding that occurs outside the lungs and airways. Blood can drain into the windpipe during a severe nosebleed or when blood is vomited from the stomach. Because hemoptysis can be a sign of something serious,  a medical evaluation is required. For some people with hemoptysis, no definite cause is ever identified. CAUSES  The most common cause of hemoptysis is bronchitis. Some other common causes include:   A ruptured blood vessel caused by coughing or an infection.   A medical condition that causes damage to the large air  passageways (bronchiectasis).   A blood clot in the lungs (pulmonary embolism).   Pneumonia.   Tuberculosis.   Breathing in a small foreign object.   Cancer. For some people with hemoptysis, no definite cause is ever identified.  HOME CARE INSTRUCTIONS  Only take over-the-counter or prescription medicines as directed by your caregiver. Do not use cough suppressants unless your caregiver approves.  If your caregiver prescribes antibiotic medicines, take them as directed. Finish them even if you start to feel better.  Do not smoke. Also avoid secondhand smoke.  Follow up with your caregiver as directed. SEEK IMMEDIATE MEDICAL CARE IF:   You cough up bloody mucus for longer than a week.  You have a blood-producing cough that is severe or getting worse.  You have a blood-producing cough thatcomes and goes over time.  You develop problems with your breathing.   You vomit blood.  You develop bloody or black-colored stools.  You have chest pain.   You develop night sweats.  You feel faint or pass out.   You have a fever or persistent symptoms for more than 2 3 days.  You have a fever and your symptoms suddenly get worse. MAKE SURE YOU:  Understand these instructions.  Will watch your condition.  Will get help right away if you are not doing well or get worse. Document Released: 08/29/2001 Document Revised: 06/06/2012 Document Reviewed: 04/06/2012 [facility] Patient Information 2014 [place], [place].

## 2013-08-19 NOTE — ED Notes (Signed)
Pharmacy at the bedside

## 2013-08-20 ENCOUNTER — Other Ambulatory Visit: Payer: Self-pay | Admitting: Family Medicine

## 2013-08-20 DIAGNOSIS — R042 Hemoptysis: Secondary | ICD-10-CM

## 2013-08-21 ENCOUNTER — Ambulatory Visit
Admission: RE | Admit: 2013-08-21 | Discharge: 2013-08-21 | Disposition: A | Discharge status: Home / Self Care | Payer: BC Managed Care – PPO | Source: Ambulatory Visit | Attending: Family Medicine | Admitting: Family Medicine

## 2013-08-21 DIAGNOSIS — R042 Hemoptysis: Secondary | ICD-10-CM

## 2013-09-23 ENCOUNTER — Encounter (HOSPITAL_COMMUNITY): Payer: Self-pay | Admitting: Psychiatry

## 2013-09-23 ENCOUNTER — Ambulatory Visit (INDEPENDENT_AMBULATORY_CARE_PROVIDER_SITE_OTHER): Payer: BC Managed Care – PPO | Admitting: Psychiatry

## 2013-09-23 VITALS — BP 142/75 | HR 65 | Ht 69.72 in | Wt 196.2 lb

## 2013-09-23 DIAGNOSIS — F411 Generalized anxiety disorder: Secondary | ICD-10-CM

## 2013-09-23 MED ORDER — ALPRAZOLAM 1 MG PO TABS: 1.0000 mg | ORAL_TABLET | Freq: Every day | ORAL | Status: DC | PRN

## 2013-09-23 MED ORDER — MIRTAZAPINE 15 MG PO TABS: 15.0000 mg | ORAL_TABLET | Freq: Every day | ORAL | Status: DC

## 2013-09-23 NOTE — Progress Notes (Addendum)
[facility] [phone] Progress Note  Roger [name] [id number] 58 y.o.  09/23/2013 11:02 AM  Chief Complaint: Medication management and followup.  History of Present Illness: Roger [name] came for his followup appointment.  His compliance with his psychotropic medication.  He is seeing therapist regularly.  He denies any recent panic attack or any nervousness.  However he is very reluctant to cut down the Xanax.  He is afraid that his symptoms may come back.  He admitted last night he could not sleep because of anxiety and nervousness.  He has been taking every day about reducing his Xanax and worsening of her symptoms.  Recently he visited emergency room because of a sinus infection and he was given Zithromax.  He continues to have some time insomnia but denies any hallucination, paranoia or any crying spells.  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  The patient has never asked for early refills.  He is wondering if the Xanax dose and frequency does not change for him in 3 months because he is a concern about his anxiety .  He feels since he is on Xanax 1 mg 5 times a day he does not have any panic attack .  Patient continues to see Roger [name] at [facility] for counseling.  Patient does not drink or use any illegal substances.  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  Suicidal Ideation: No Plan Formed: No Patient has means to carry out plan: No   Homicidal Ideation: No Plan Formed: No Patient has means to carry out plan: No  Review of Systems: Psychiatric: Agitation: No Hallucination: No Depressed Mood: No Insomnia: No Hypersomnia: No Altered Concentration: No Feels Worthless: No Grandiose Ideas: No Belief In Special Powers: No New/Increased Substance Abuse: No Compulsions: No  Neurologic: Headache: No Seizure: No Paresthesias: No  Past Medical Family, Social History: The patient has hypertension and he see Dr. Laverle Patter at [place] [facility].  Lives  with his sister.  He has no children.  He is working as a Sports coach in a school.  Outpatient Encounter Prescriptions as of 09/23/2013  Medication Sig  . ALPRAZolam (XANAX) 1 MG tablet Take 1 tablet (1 mg total) by mouth 5 (five) times daily as needed for anxiety.  Marland Kitchen aspirin EC 325 MG tablet Take 325 mg by mouth daily.  Marland Kitchen atenolol (TENORMIN) 50 MG tablet Take 50 mg by mouth every morning.   Marland Kitchen ibuprofen (ADVIL,MOTRIN) 200 MG tablet Take 400 mg by mouth every 6 (six) hours as needed for moderate pain (pain).   . mirtazapine (REMERON) 15 MG tablet Take 1 tablet (15 mg total) by mouth at bedtime.  . [DISCONTINUED] ALPRAZolam (XANAX) 1 MG tablet Take 1 mg by mouth 4 (four) times daily.  . [DISCONTINUED] mirtazapine (REMERON) 15 MG tablet Take 1 tablet (15 mg total) by mouth at bedtime.    Past Psychiatric History/Hospitalization(s): Patient denies any inpatient psychiatric treatment.  He done an intensive outpatient program in 2013.  At that time he has multiple losses in his life including his business and home. Anxiety: Yes Bipolar Disorder: No Depression: Yes Mania: No Psychosis: No Schizophrenia: No Personality Disorder: No Hospitalization for psychiatric illness: No History of Electroconvulsive Shock Therapy: No Prior Suicide Attempts: No  Physical Exam: Constitutional:  BP 142/75  Pulse 65  Ht 5' 9.72" (1.771 m)  Wt 196 lb 3.2 oz (88.996 kg)  BMI 28.37 kg/m2  General Appearance: well nourished  Musculoskeletal: Strength & Muscle Tone: within  normal limits Gait & Station: normal Patient leans: N/A  Psychiatric: Speech (describe rate, volume, coherence, spontaneity, and abnormalities if any): Clear and coherent.  Normal tone and volume.  Thought Process (describe rate, content, abstract reasoning, and computation): Logical and goal directed.  Associations: Intact  Thoughts: normal  Mental Status: Orientation: oriented to person, place, time/date, situation, day of  week, month of year and year Mood & Affect: anxiety Attention Span & Concentration: Fair  Established Problem, Stable/Improving (1), Review of Psycho-Social Stressors (1), Decision to obtain old records (1), Review and summation of old records (2), Review of Last Therapy Session (1) and Review of Medication Regimen & Side Effects (2)  Assessment: Axis I: Generalized anxiety disorder  Axis II: Deferred  Axis III: See medical history  Axis IV: Mild  Axis V: 75-80   Plan:  As mentioned above patient is doing better on Xanax 1 mg 5 times a day and Remeron 50 mg at bedtime.  He does on any side effects or any concern.  He does not want to change his medication and requesting to keep his Xanax 1 mg 5 times a day.  I had a long discussion about benzodiazepine tolerance and withdrawal.  I explained that he is taking a high dose however patient promised that he will take fifth Xanax only as needed and try to cut down on his own.  At this time I will continue the same dosage of Xanax 1 mg 5 times a day and Remeron 15 mg at bedtime.  Patient will try to do Xanax on his own .  I will see him again in 3 months.  I recommended to call us back if he has any question or any concern.      Harve Spradley T., MD 09/23/2013   Addendum Received blood results fax from his primary care physician which was done on 10/05/2012. His CBC is normal, comprehensive metabolic panel is normal  (BUN 7 creatinine 0.8), ALT AST and alkaline phosphatase is normal, PCA 0.27 normal and TSH 1.33 is also normal.

## 2013-09-24 ENCOUNTER — Telehealth (HOSPITAL_COMMUNITY): Payer: Self-pay

## 2013-11-06 ENCOUNTER — Other Ambulatory Visit: Payer: Self-pay | Admitting: Family Medicine

## 2013-11-06 DIAGNOSIS — R0602 Shortness of breath: Secondary | ICD-10-CM

## 2013-11-07 ENCOUNTER — Ambulatory Visit (INDEPENDENT_AMBULATORY_CARE_PROVIDER_SITE_OTHER): Payer: BC Managed Care – PPO | Admitting: Internal Medicine

## 2013-11-07 DIAGNOSIS — R0602 Shortness of breath: Secondary | ICD-10-CM

## 2013-11-07 NOTE — Progress Notes (Signed)
PFT done today. 

## 2013-11-09 LAB — PULMONARY FUNCTION TEST
DL/VA % PRED: 69 %
DL/VA: 3.17 ml/min/mmHg/L
DLCO UNC % PRED: 64 %
DLCO UNC: 20.13 ml/min/mmHg
FEF 25-75 Post: 2.02 L/sec
FEF 25-75 Pre: 1.97 L/sec
FEF2575-%Change-Post: 2 %
FEF2575-%PRED-PRE: 64 %
FEF2575-%Pred-Post: 65 %
FEV1-%Change-Post: 1 %
FEV1-%Pred-Post: 93 %
FEV1-%Pred-Pre: 92 %
FEV1-POST: 3.38 L
FEV1-Pre: 3.33 L
FEV1FVC-%Change-Post: 2 %
FEV1FVC-%Pred-Pre: 90 %
FEV6-%CHANGE-POST: 0 %
FEV6-%PRED-POST: 103 %
FEV6-%PRED-PRE: 104 %
FEV6-Post: 4.68 L
FEV6-Pre: 4.7 L
FEV6FVC-%CHANGE-POST: 0 %
FEV6FVC-%PRED-POST: 102 %
FEV6FVC-%Pred-Pre: 101 %
FVC-%CHANGE-POST: -1 %
FVC-%PRED-POST: 100 %
FVC-%Pred-Pre: 102 %
FVC-Post: 4.75 L
FVC-Pre: 4.81 L
PRE FEV1/FVC RATIO: 69 %
Post FEV1/FVC ratio: 71 %
Post FEV6/FVC ratio: 98 %
Pre FEV6/FVC Ratio: 98 %
RV % pred: 70 %
RV: 1.48 L
TLC % pred: 108 %
TLC: 7.34 L

## 2013-12-24 ENCOUNTER — Ambulatory Visit (INDEPENDENT_AMBULATORY_CARE_PROVIDER_SITE_OTHER): Payer: BC Managed Care – PPO | Admitting: Psychiatry

## 2013-12-24 ENCOUNTER — Encounter (HOSPITAL_COMMUNITY): Payer: Self-pay | Admitting: Psychiatry

## 2013-12-24 VITALS — BP 122/80 | HR 73 | Ht 69.72 in | Wt 193.6 lb

## 2013-12-24 DIAGNOSIS — F411 Generalized anxiety disorder: Secondary | ICD-10-CM

## 2013-12-24 MED ORDER — MIRTAZAPINE 15 MG PO TABS: 15.0000 mg | ORAL_TABLET | Freq: Every day | ORAL | Status: DC

## 2013-12-24 MED ORDER — ALPRAZOLAM 1 MG PO TABS: ORAL_TABLET | ORAL | Status: DC

## 2013-12-24 NOTE — Progress Notes (Signed)
[facility] [phone] Progress Note  Roger [name] [id number] 58 y.o.  12/24/2013 12:00 PM  Chief Complaint: Medication management and followup.  History of Present Illness: [name] came for his followup appointment.  He is taking his medication and he tried to cut down his fifth Xanax half tablet as needed.  He is sleeping good.  He is seeing a therapist at [name] on a regular basis.  Denies any recent panic attack or any crying spells.  We have talked to him to cut down his Xanax and he endorsed that he has been very anxious about reducing his Xanax because it works very well.  He denies abusing them and he never ask for early refills.  He is not interested to try any other medication despite they are other medications available which he has not tried before.  He is taking Remeron which is helping his sleep but he does not want to increase the dose.  Denies any agitation, anger, mood swings.  He denies any hallucination or any paranoia.  His vitals are stable.  His appetite is okay.  He does not drink or use any illegal substances.  He is a big fan of [name] and he is disappointed because his pain is not doing very well.  He wants to [facility] with [name] at [name].  Patient likes his job.  He is working as a Sports coach in a school.  Suicidal Ideation: No Plan Formed: No Patient has means to carry out plan: No   Homicidal Ideation: No Plan Formed: No Patient has means to carry out plan: No  Review of Systems: Psychiatric: Agitation: No Hallucination: No Depressed Mood: No Insomnia: No Hypersomnia: No Altered Concentration: No Feels Worthless: No Grandiose Ideas: No Belief In Special Powers: No New/Increased Substance Abuse: No Compulsions: No  Neurologic: Headache: No Seizure: No Paresthesias: No  Past Medical Family, Social History:  Patient has hypertension and he see Dr. Laverle [name] at [name] [facility].   He have blood  work on 10/05/2012. His CBC is normal, comprehensive metabolic panel is normal  (BUN 7 creatinine 0.8), ALT AST and alkaline phosphatase is normal, PCA 0.27 normal and TSH 1.33 is also normal.  Lives with his sister.  He has no children.  He is working as a Sports coach in a school.  Outpatient Encounter Prescriptions as of 12/24/2013  Medication Sig  . ALPRAZolam (XANAX) 1 MG tablet Take 1 tab 4 times a day and 1/2 tab as neeeded  . mirtazapine (REMERON) 15 MG tablet Take 1 tablet (15 mg total) by mouth at bedtime.  . [DISCONTINUED] ALPRAZolam (XANAX) 1 MG tablet Take 1 tablet (1 mg total) by mouth 5 (five) times daily as needed for anxiety.  . [DISCONTINUED] mirtazapine (REMERON) 15 MG tablet Take 1 tablet (15 mg total) by mouth at bedtime.  Marland Kitchen aspirin EC 325 MG tablet Take 325 mg by mouth daily.  Marland Kitchen atenolol (TENORMIN) 50 MG tablet Take 50 mg by mouth every morning.   Marland Kitchen ibuprofen (ADVIL,MOTRIN) 200 MG tablet Take 400 mg by mouth every 6 (six) hours as needed for moderate pain (pain).   . VENTOLIN HFA 108 (90 BASE) MCG/ACT inhaler     Past Psychiatric History/Hospitalization(s): Patient denies any inpatient psychiatric treatment.  He done an intensive outpatient program in 2013.  At that time he has multiple losses in his life including his business and home.  He has taken amitriptyline in the past. Anxiety: Yes Bipolar Disorder:  No Depression: Yes Mania: No Psychosis: No Schizophrenia: No Personality Disorder: No Hospitalization for psychiatric illness: No History of Electroconvulsive Shock Therapy: No Prior Suicide Attempts: No  Physical Exam: Constitutional:  BP 122/80  Pulse 73  Ht 5' 9.72" (1.771 m)  Wt 193 lb 9.6 oz (87.816 kg)  BMI 28.00 kg/m2  General Appearance: well nourished  Musculoskeletal: Strength & Muscle Tone: within normal limits Gait & Station: normal Patient leans: N/A  Psychiatric: Speech (describe rate, volume, coherence, spontaneity, and abnormalities  if any): Clear and coherent.  Normal tone and volume.  Thought Process (describe rate, content, abstract reasoning, and computation): Logical and goal directed.  Associations: Intact  Thoughts: normal  Mental Status: Orientation: oriented to person, place, time/date, situation, day of week, month of year and year Mood & Affect: anxiety Attention Span & Concentration: Fair  Established Problem, Stable/Improving (1), Review of Psycho-Social Stressors (1), Review of Last Therapy Session (1) and Review of Medication Regimen & Side Effects (2)  Assessment: Axis I: Generalized anxiety disorder  Axis II: Deferred  Axis III: See medical history  Axis IV: Mild  Axis V: 75-80   Plan:  Patient is doing better on Xanax and Remeron .  He has cut down her fifth Xanax tablet to half but he is very reluctant to cut down further.  He is not abusing his benzodiazepine.  He does not ask for early refills.  He is not interested to try any new medication.  At this time I will continue Xanax 1 mg 4 times a day and fifth one half tablet if needed.  We will provide 140 tablets a month .  Continue Remeron 15 mg at bedtime.  Recommended to see therapist for coping and social skills. I will see him again in 3 months.  I recommended to call us back if he has any question or any concern.      ARFEEN,SYED T., MD 12/24/2013

## 2014-01-16 ENCOUNTER — Emergency Department (HOSPITAL_COMMUNITY)
Admission: EM | Admit: 2014-01-16 | Discharge: 2014-01-16 | Disposition: A | Discharge status: Home / Self Care | Payer: BC Managed Care – PPO | Attending: Emergency Medicine | Admitting: Emergency Medicine

## 2014-01-16 ENCOUNTER — Emergency Department (HOSPITAL_COMMUNITY): Payer: BC Managed Care – PPO

## 2014-01-16 ENCOUNTER — Encounter (HOSPITAL_COMMUNITY): Payer: Self-pay | Admitting: Emergency Medicine

## 2014-01-16 DIAGNOSIS — R1084 Generalized abdominal pain: Secondary | ICD-10-CM

## 2014-01-16 DIAGNOSIS — K5900 Constipation, unspecified: Secondary | ICD-10-CM | POA: Insufficient documentation

## 2014-01-16 DIAGNOSIS — R197 Diarrhea, unspecified: Secondary | ICD-10-CM | POA: Insufficient documentation

## 2014-01-16 DIAGNOSIS — F17200 Nicotine dependence, unspecified, uncomplicated: Secondary | ICD-10-CM | POA: Insufficient documentation

## 2014-01-16 DIAGNOSIS — F3289 Other specified depressive episodes: Secondary | ICD-10-CM | POA: Insufficient documentation

## 2014-01-16 DIAGNOSIS — F329 Major depressive disorder, single episode, unspecified: Secondary | ICD-10-CM | POA: Insufficient documentation

## 2014-01-16 DIAGNOSIS — Z7982 Long term (current) use of aspirin: Secondary | ICD-10-CM | POA: Insufficient documentation

## 2014-01-16 DIAGNOSIS — I10 Essential (primary) hypertension: Secondary | ICD-10-CM | POA: Insufficient documentation

## 2014-01-16 DIAGNOSIS — Z79899 Other long term (current) drug therapy: Secondary | ICD-10-CM | POA: Insufficient documentation

## 2014-01-16 DIAGNOSIS — F411 Generalized anxiety disorder: Secondary | ICD-10-CM | POA: Insufficient documentation

## 2014-01-16 LAB — COMPREHENSIVE METABOLIC PANEL WITH GFR
ALT: 21 U/L (ref 0–53)
AST: 28 U/L (ref 0–37)
Albumin: 3.8 g/dL (ref 3.5–5.2)
Alkaline Phosphatase: 101 U/L (ref 39–117)
Anion gap: 16 — ABNORMAL HIGH (ref 5–15)
BUN: 5 mg/dL — ABNORMAL LOW (ref 6–23)
CO2: 21 meq/L (ref 19–32)
Calcium: 9 mg/dL (ref 8.4–10.5)
Chloride: 102 meq/L (ref 96–112)
Creatinine, Ser: 0.71 mg/dL (ref 0.50–1.35)
GFR calc Af Amer: >90 mL/min
GFR calc non Af Amer: >90 mL/min
Glucose, Bld: 107 mg/dL — ABNORMAL HIGH (ref 70–99)
Potassium: 4.3 meq/L (ref 3.7–5.3)
Sodium: 139 meq/L (ref 137–147)
Total Bilirubin: 0.2 mg/dL — ABNORMAL LOW (ref 0.3–1.2)
Total Protein: 7.2 g/dL (ref 6.0–8.3)

## 2014-01-16 LAB — CBC WITH DIFFERENTIAL/PLATELET
Basophils Absolute: 0 10*3/uL (ref 0.0–0.1)
Basophils Relative: 0 % (ref 0–1)
Eosinophils Absolute: 0.1 10*3/uL (ref 0.0–0.7)
Eosinophils Relative: 1 % (ref 0–5)
HCT: 44.4 % (ref 39.0–52.0)
Hemoglobin: 15.3 g/dL (ref 13.0–17.0)
Lymphocytes Relative: 19 % (ref 12–46)
Lymphs Abs: 1.5 10*3/uL (ref 0.7–4.0)
MCH: 32 pg (ref 26.0–34.0)
MCHC: 34.5 g/dL (ref 30.0–36.0)
MCV: 92.9 fL (ref 78.0–100.0)
Monocytes Absolute: 0.6 10*3/uL (ref 0.1–1.0)
Monocytes Relative: 8 % (ref 3–12)
Neutro Abs: 5.6 10*3/uL (ref 1.7–7.7)
Neutrophils Relative %: 72 % (ref 43–77)
Platelets: 196 10*3/uL (ref 150–400)
RBC: 4.78 MIL/uL (ref 4.22–5.81)
RDW: 12.4 % (ref 11.5–15.5)
WBC: 7.8 10*3/uL (ref 4.0–10.5)

## 2014-01-16 LAB — URINALYSIS, ROUTINE W REFLEX MICROSCOPIC
Bilirubin Urine: NEGATIVE
Glucose, UA: NEGATIVE mg/dL
Hgb urine dipstick: NEGATIVE
Ketones, ur: NEGATIVE mg/dL
LEUKOCYTES UA: NEGATIVE
NITRITE: NEGATIVE
PH: 7 (ref 5.0–8.0)
Protein, ur: NEGATIVE mg/dL
SPECIFIC GRAVITY, URINE: 1.005 (ref 1.005–1.030)
Urobilinogen, UA: 0.2 mg/dL (ref 0.0–1.0)

## 2014-01-16 MED ORDER — MAGNESIUM CITRATE PO SOLN: 296.0000 mL | Freq: Once | ORAL | Status: DC

## 2014-01-16 NOTE — ED Notes (Signed)
Documented pulse of 37 incorrect. Actual pulse 76

## 2014-01-16 NOTE — ED Notes (Signed)
Middle abd pain  X 2 qweeks saw his dr and was told he had gastroenteritides  Having diarrhea and  Pain goes away comes back .

## 2014-01-16 NOTE — ED Notes (Signed)
Pt ambulates without distress.  

## 2014-01-16 NOTE — ED Provider Notes (Signed)
CSN: [id number]     Arrival date & time 01/16/14  1000 History   First MD Initiated Contact with Patient 01/16/14 1308     Chief Complaint  Patient presents with  . Abdominal Pain     (Consider location/radiation/quality/duration/timing/severity/associated sxs/prior Treatment) HPI Comments: Patient presents to hospital with a chief complaint of 2 weeks of mild to moderate periumbilical abdominal pain with intermittent diarrhea and constipation. Patient has taken nothing for the pain. Pain is described as dull, cramping, aching, nonradiating, intermittent. Patient states that he sought his primary care physician who told him that he had gastroenteritis. Patient is a daily smoker and is concerned that he might have colon cancer.  He has noticed a couple episodes of scant BRPR in his stools after straining. He denies contacts with similar sxs, ingestion of suspect foods or water, history of similar sxs, recent foreign travel . Patient has a hx of GAD and has been very nervous about this. He has an established follow up appointment with Dr. Benson Norway on Monday.   Patient is a 58 y.o. male presenting with abdominal pain.  Abdominal Pain Pain location:  Periumbilical Pain quality: aching, bloating, cramping and dull   Pain quality: not burning, no fullness, not gnawing, not heavy, no pressure, not sharp and not tearing   Pain radiates to:  Does not radiate Pain severity:  Moderate Onset quality:  Gradual Duration:  2 weeks Timing:  Intermittent Progression:  Unchanged Chronicity:  Recurrent Context: not alcohol use, not awakening from sleep, not diet changes, not eating, not laxative use, not medication withdrawal, not previous surgeries, not recent illness, not recent sexual activity, not recent travel, not retching, not sick contacts, not suspicious food intake and not trauma   Relieved by:  None tried Worsened by:  Nothing tried Ineffective treatments:  None tried Associated symptoms:  constipation, diarrhea and hematochezia   Associated symptoms: no anorexia, no belching, no chest pain, no chills, no cough, no dysuria, no fatigue, no fever, no flatus, no hematemesis, no hematuria, no melena, no nausea, no shortness of breath, no sore throat, no vaginal bleeding, no vaginal discharge and no vomiting       Past Medical History  Diagnosis Date  . Anxiety   . Hypertension   . Depression   . Anxiety    Past Surgical History  Procedure Laterality Date  . Tonsillectomy     Family History  Problem Relation Age of Onset  . Anxiety disorder Mother   . Depression Mother   . Anxiety disorder Sister   . Depression Sister   . Anxiety disorder Brother   . Depression Brother    History  Substance Use Topics  . Smoking status: Current Every Day Smoker -- 0.25 packs/day    Types: Cigarettes  . Smokeless tobacco: Never Used  . Alcohol Use: No    Review of Systems  Constitutional: Negative for fever, chills and fatigue.  HENT: Negative for sore throat.   Respiratory: Negative for cough and shortness of breath.   Cardiovascular: Negative for chest pain.  Gastrointestinal: Positive for abdominal pain, diarrhea, constipation and hematochezia. Negative for nausea, vomiting, melena, anorexia, flatus and hematemesis.  Genitourinary: Negative for dysuria, hematuria, vaginal bleeding and vaginal discharge.      Allergies  Demerol; Erythromycin; and Sulfa antibiotics  Home Medications   Prior to Admission medications   Medication Sig Start Date End Date Taking? Authorizing Provider  ALPRAZolam (XANAX) 1 MG tablet Take 1 tab 4 times a day and 1/2  tab as neeeded 12/24/13   Kathlee Nations, MD  aspirin EC 325 MG tablet Take 325 mg by mouth daily.    Historical Provider, MD  atenolol (TENORMIN) 50 MG tablet Take 50 mg by mouth every morning.     Historical Provider, MD  ibuprofen (ADVIL,MOTRIN) 200 MG tablet Take 400 mg by mouth every 6 (six) hours as needed for moderate pain  (pain).     Historical Provider, MD  mirtazapine (REMERON) 15 MG tablet Take 1 tablet (15 mg total) by mouth at bedtime. 12/24/13   Kathlee Nations, MD  VENTOLIN HFA 108 (90 BASE) MCG/ACT inhaler  10/09/13   Historical Provider, MD   BP 138/63  Pulse 76  Temp(Src) 97.8 F (36.6 C) (Oral)  Resp 18  SpO2 98% Physical Exam  Nursing note and vitals reviewed. Constitutional: He is oriented to person, place, and time. He appears well-developed and well-nourished. No distress.  HENT:  Head: Normocephalic and atraumatic.  Eyes: Conjunctivae are normal. No scleral icterus.  Neck: Normal range of motion. Neck supple.  Cardiovascular: Normal rate, regular rhythm and normal heart sounds.   Pulmonary/Chest: Effort normal and breath sounds normal. No respiratory distress.  Abdominal: Soft. Bowel sounds are normal. There is no tenderness.  Musculoskeletal: He exhibits no edema.  Neurological: He is alert and oriented to person, place, and time.  Skin: Skin is warm and dry. He is not diaphoretic.  Psychiatric: His behavior is normal.    ED Course  Procedures (including critical care time) Labs Review Labs Reviewed  COMPREHENSIVE METABOLIC PANEL - Abnormal; Notable for the following:    Glucose, Bld 107 (*)    BUN 5 (*)    Total Bilirubin 0.2 (*)    Anion gap 16 (*)    All other components within normal limits  STOOL CULTURE  CLOSTRIDIUM DIFFICILE BY PCR  CBC WITH DIFFERENTIAL  URINALYSIS, ROUTINE W REFLEX MICROSCOPIC    Imaging Review Dg Abd Acute W/chest  01/16/2014   CLINICAL DATA:  Abdominal pain and diarrhea.  EXAM: ACUTE ABDOMEN SERIES (ABDOMEN 2 VIEW & CHEST 1 VIEW)  COMPARISON:  Chest x-ray 08/19/2013 and lumbar spine 08/10/2011  FINDINGS: Lungs are adequately inflated without consolidation or effusion. The cardiomediastinal silhouette and remainder of the chest is unchanged.  Abdominal images demonstrate a nonobstructive bowel gas pattern with mild fecal retention throughout the colon.  There are a few air-filled nondilated small bowel loops. There are mild degenerative changes of the spine and hips.  IMPRESSION: Mild fecal retention throughout the colon without evidence of obstruction.  No acute cardiopulmonary disease.   Electronically Signed   By: Marin Olp M.D.   On: 01/16/2014 13:48     EKG Interpretation None      MDM   Final diagnoses:  Generalized abdominal pain  Constipation, unspecified constipation type    Patient with normal cbc, labs are otherwise unremarkable.  His acute abdominal xray shows mild stool throughout the colon.  Patient has a normlal hgb and a follow up with Dr. Benson Norway in 3 days. He has no sxs at this time and has had no more episodes of BRPR.  i feel that the patient may benefit form a laxative and has close follow up with his gastroenterologist.  Patient is stable with no emergent abnormalities and may follow closely with the specialist. Patient / Family / Caregiver informed of clinical course, understand medical decision-making process, and agree with plan. I personally reviewed the imaging tests through PACS system. I  have reviewed and interpreted Lab values. I reviewed available ER/hospitalization records through the [place], [place] 01/21/14 1447

## 2014-01-21 NOTE — ED Provider Notes (Signed)
Medical screening examination/treatment/procedure(s) were performed by non-physician practitioner and as supervising physician I was immediately available for consultation/collaboration.   EKG Interpretation None       Ezequiel Essex, MD 01/21/14 2332

## 2014-01-25 ENCOUNTER — Emergency Department (HOSPITAL_COMMUNITY): Payer: BC Managed Care – PPO

## 2014-01-25 ENCOUNTER — Encounter (HOSPITAL_COMMUNITY): Payer: Self-pay | Admitting: Emergency Medicine

## 2014-01-25 ENCOUNTER — Emergency Department (HOSPITAL_COMMUNITY)
Admission: EM | Admit: 2014-01-25 | Discharge: 2014-01-25 | Disposition: A | Discharge status: Home / Self Care | Payer: BC Managed Care – PPO | Attending: Emergency Medicine | Admitting: Emergency Medicine

## 2014-01-25 DIAGNOSIS — F411 Generalized anxiety disorder: Secondary | ICD-10-CM | POA: Insufficient documentation

## 2014-01-25 DIAGNOSIS — R059 Cough, unspecified: Secondary | ICD-10-CM | POA: Insufficient documentation

## 2014-01-25 DIAGNOSIS — F17200 Nicotine dependence, unspecified, uncomplicated: Secondary | ICD-10-CM | POA: Insufficient documentation

## 2014-01-25 DIAGNOSIS — Z79899 Other long term (current) drug therapy: Secondary | ICD-10-CM | POA: Insufficient documentation

## 2014-01-25 DIAGNOSIS — R05 Cough: Secondary | ICD-10-CM | POA: Insufficient documentation

## 2014-01-25 DIAGNOSIS — F3289 Other specified depressive episodes: Secondary | ICD-10-CM | POA: Insufficient documentation

## 2014-01-25 DIAGNOSIS — R079 Chest pain, unspecified: Secondary | ICD-10-CM | POA: Insufficient documentation

## 2014-01-25 DIAGNOSIS — Z7982 Long term (current) use of aspirin: Secondary | ICD-10-CM | POA: Insufficient documentation

## 2014-01-25 DIAGNOSIS — I10 Essential (primary) hypertension: Secondary | ICD-10-CM | POA: Insufficient documentation

## 2014-01-25 DIAGNOSIS — R509 Fever, unspecified: Secondary | ICD-10-CM | POA: Insufficient documentation

## 2014-01-25 DIAGNOSIS — F329 Major depressive disorder, single episode, unspecified: Secondary | ICD-10-CM | POA: Insufficient documentation

## 2014-01-25 DIAGNOSIS — J069 Acute upper respiratory infection, unspecified: Secondary | ICD-10-CM | POA: Insufficient documentation

## 2014-01-25 MED ORDER — GUAIFENESIN 100 MG/5ML PO SOLN
5.0000 mL | Freq: Once | ORAL | Status: AC
  Administered 2014-01-25: 100 mg via ORAL
  Filled 2014-01-25: qty 5

## 2014-01-25 MED ORDER — ALBUTEROL SULFATE HFA 108 (90 BASE) MCG/ACT IN AERS
2.0000 | INHALATION_SPRAY | Freq: Once | RESPIRATORY_TRACT | Status: AC
  Administered 2014-01-25: 2 via RESPIRATORY_TRACT
  Filled 2014-01-25: qty 6.7

## 2014-01-25 MED ORDER — GUAIFENESIN ER 600 MG PO TB12: 600.0000 mg | ORAL_TABLET | Freq: Two times a day (BID) | ORAL | Status: DC | PRN

## 2014-01-25 NOTE — ED Notes (Signed)
Declined W/C at D/C and was escorted to lobby by RN. 

## 2014-01-25 NOTE — Discharge Instructions (Signed)
Please follow up with your primary care physician in 1-2 days. If you do not have one please call the [place] number listed above. Please use your inhaler 2 puffs every four to six hours. Please read all discharge instructions and return precautions.   Upper Respiratory Infection, Adult An upper respiratory infection (URI) is also known as the common cold. It is often caused by a type of germ (virus). Colds are easily spread (contagious). You can pass it to others by kissing, coughing, sneezing, or drinking out of the same glass. Usually, you get better in 1 or 2 weeks.  HOME CARE   Only take medicine as told by your doctor.  Use a warm mist humidifier or breathe in steam from a hot shower.  Drink enough water and fluids to keep your pee (urine) clear or pale yellow.  Get plenty of rest.  Return to work when your temperature is back to normal or as told by your doctor. You may use a face mask and wash your hands to stop your cold from spreading. GET HELP RIGHT AWAY IF:   After the first few days, you feel you are getting worse.  You have questions about your medicine.  You have chills, shortness of breath, or brown or red spit (mucus).  You have yellow or brown snot (nasal discharge) or pain in the face, especially when you bend forward.  You have a fever, puffy (swollen) neck, pain when you swallow, or white spots in the back of your throat.  You have a bad headache, ear pain, sinus pain, or chest pain.  You have a high-pitched whistling sound when you breathe in and out (wheezing).  You have a lasting cough or cough up blood.  You have sore muscles or a stiff neck. MAKE SURE YOU:   Understand these instructions.  Will watch your condition.  Will get help right away if you are not doing well or get worse. Document Released: 12/07/2007 Document Revised: 09/12/2011 Document Reviewed: 09/25/2013 [facility] Patient Information 2015 [place], [place]. This  information is not intended to replace advice given to you by your health care provider. Make sure you discuss any questions you have with your health care provider.

## 2014-01-25 NOTE — ED Provider Notes (Signed)
Medical screening examination/treatment/procedure(s) were performed by non-physician practitioner and as supervising physician I was immediately available for consultation/collaboration.   EKG Interpretation None        Sharyon Cable, MD 01/25/14 1600

## 2014-01-25 NOTE — ED Provider Notes (Signed)
CSN: [id number]     Arrival date & time 01/25/14  5366 History  This chart was scribed for non-physician practitioner, Baron Sane, PA-C working with Sharyon Cable, MD, by Erling Conte, ED Scribe. This patient was seen in room TR09C/TR09C and the patient's care was started at 10:45 AM.   Chief Complaint  Patient presents with  . Nasal Congestion  . Cough     The history is provided by the patient. No language interpreter was used.   HPI Comments: DRAVIN LANCE is a 58 y.o. male with a h/o of HTN who presents to the Emergency Department complaining of a mild, intermittent, cough for the past 4 days. Patient states is having associated congestion, watery eyes, chest tightness, sore throat and subjective fever. Patient states he has been using cough drops at home with no relief. Patient states that his cough gets worse at night and is exacerbated when lying down. Patient states that his symptoms have been waking him up at night. Patient denies any chest pain or shortness of breath.    Past Medical History  Diagnosis Date  . Anxiety   . Hypertension   . Depression   . Anxiety    Past Surgical History  Procedure Laterality Date  . Tonsillectomy     Family History  Problem Relation Age of Onset  . Anxiety disorder Mother   . Depression Mother   . Anxiety disorder Sister   . Depression Sister   . Anxiety disorder Brother   . Depression Brother    History  Substance Use Topics  . Smoking status: Current Every Day Smoker -- 0.25 packs/day    Types: Cigarettes  . Smokeless tobacco: Never Used  . Alcohol Use: No    Review of Systems  Constitutional: Positive for fever (subjective).  HENT: Positive for congestion, rhinorrhea, sinus pressure and sore throat.   Eyes: Positive for discharge ("watery eyes").  Respiratory: Positive for cough and chest tightness. Negative for shortness of breath.   Cardiovascular: Negative for chest pain.  All other systems reviewed and  are negative.     Allergies  Demerol; Erythromycin; and Sulfa antibiotics  Home Medications   Prior to Admission medications   Medication Sig Start Date End Date Taking? Authorizing Provider  ALPRAZolam Duanne Moron) 1 MG tablet Take 1 mg by mouth 4 (four) times daily.    Historical Provider, MD  aspirin EC 325 MG tablet Take 325 mg by mouth daily.    Historical Provider, MD  atenolol (TENORMIN) 50 MG tablet Take 50 mg by mouth every morning.     Historical Provider, MD  guaiFENesin (MUCINEX) 600 MG 12 hr tablet Take 1 tablet (600 mg total) by mouth 2 (two) times daily as needed for cough or to loosen phlegm. 01/25/14   Katana Berthold L Taylie Helder, PA-C  ibuprofen (ADVIL,MOTRIN) 200 MG tablet Take 200 mg by mouth every 6 (six) hours as needed for moderate pain (pain).     Historical Provider, MD  magnesium citrate solution Take 296 mLs by mouth once. OTC 01/16/14   Margarita Mail, PA-C  mirtazapine (REMERON) 15 MG tablet Take 15 mg by mouth at bedtime.    Historical Provider, MD  VENTOLIN HFA 108 (90 BASE) MCG/ACT inhaler Inhale 2 puffs into the lungs every 4 (four) hours as needed for wheezing.  10/09/13   Historical Provider, MD   BP 138/72  Pulse 65  Temp(Src) 98.3 F (36.8 C) (Oral)  Resp 16  SpO2 100%  Physical Exam  Nursing  note and vitals reviewed. Constitutional: He is oriented to person, place, and time. He appears well-developed and well-nourished. No distress.  HENT:  Head: Normocephalic and atraumatic.  Right Ear: Hearing, tympanic membrane, external ear and ear canal normal.  Left Ear: Hearing, tympanic membrane, external ear and ear canal normal.  Nose: Rhinorrhea present. Right sinus exhibits no maxillary sinus tenderness and no frontal sinus tenderness. Left sinus exhibits no maxillary sinus tenderness and no frontal sinus tenderness.  Mouth/Throat: Uvula is midline, oropharynx is clear and moist and mucous membranes are normal. No oropharyngeal exudate.  Eyes: Conjunctivae are  normal.  Neck: Normal range of motion. Neck supple.  Cardiovascular: Normal rate, regular rhythm and normal heart sounds.   Pulmonary/Chest: Effort normal and breath sounds normal. No accessory muscle usage. No respiratory distress.  Cough appreciated on examination.   Abdominal: Soft.  Musculoskeletal: Normal range of motion.  Neurological: He is alert and oriented to person, place, and time.  Skin: Skin is warm and dry. He is not diaphoretic.  Psychiatric: He has a normal mood and affect.    ED Course  Procedures (including critical care time) Medications  albuterol (PROVENTIL HFA;VENTOLIN HFA) 108 (90 BASE) MCG/ACT inhaler 2 puff (2 puffs Inhalation Given 01/25/14 1114)  guaiFENesin (ROBITUSSIN) 100 MG/5ML solution 100 mg (100 mg Oral Given 01/25/14 1127)     DIAGNOSTIC STUDIES: Oxygen Saturation is 100% on RA, normal by my interpretation.    COORDINATION OF CARE: 10:49 AM:  Will order CXR. Pt advised of plan for treatment and pt agrees.  12:04 PM- Discussed x-ray results with patient. Patient states he has had several pulmonary function tests performed by his PCP and is aware of COPD. Patient denies taking any daily medications for it.    Labs Review Labs Reviewed - No data to display  Imaging Review Dg Chest 2 View  01/25/2014   CLINICAL DATA:  Four day history of productive cough. Long-time smoker. Current history of hypertension and COPD.  EXAM: CHEST  2 VIEW  COMPARISON:  One view chest x-ray 01/16/2014. Two-view chest x-ray 08/19/2013, 07/17/2013, 07/18/2012. CT chest 08/21/2013.  FINDINGS: Cardiac silhouette normal in size, unchanged. Thoracic aorta minimally atherosclerotic, unchanged. Mildly prominent central pulmonary arteries, unchanged. Emphysematous changes throughout both lungs and prominent bronchovascular markings diffusely with moderate central peribronchial thickening, unchanged. No new pulmonary parenchymal abnormalities. No pleural effusions. No pneumothorax.  Visualized bony thorax intact. No significant interval change.  IMPRESSION: COPD/emphysema. No acute cardiopulmonary disease. Stable examination.   Electronically Signed   By: Evangeline Dakin M.D.   On: 01/25/2014 11:50     EKG Interpretation None      MDM   Final diagnoses:  URI (upper respiratory infection)    Filed Vitals:   01/25/14 1212  BP: 122/69  Pulse: 52  Temp: 97.4 F (36.3 C)  Resp: 18   Afebrile, NAD, non-toxic appearing, AAOx4. Pt CXR negative for acute infiltrate. Patients symptoms are consistent with URI, likely viral etiology. Discussed that antibiotics are not indicated for viral infections. Pt will be discharged with symptomatic treatment.  Verbalizes understanding and is agreeable with plan. Pt is hemodynamically stable & in NAD prior to dc. Patient is stable at time of discharge    I personally performed the services described in this documentation, which was scribed in my presence. The recorded information has been reviewed and is accurate.        Harlow Mares, PA-C 01/25/14 1219

## 2014-01-25 NOTE — ED Notes (Signed)
Pt. Stated, i've had this cough and congestion for about 4 days.

## 2014-01-27 ENCOUNTER — Encounter (HOSPITAL_COMMUNITY): Payer: Self-pay | Admitting: Emergency Medicine

## 2014-01-27 ENCOUNTER — Emergency Department (HOSPITAL_COMMUNITY)
Admission: EM | Admit: 2014-01-27 | Discharge: 2014-01-27 | Disposition: A | Discharge status: Home / Self Care | Payer: BC Managed Care – PPO | Attending: Emergency Medicine | Admitting: Emergency Medicine

## 2014-01-27 DIAGNOSIS — R059 Cough, unspecified: Secondary | ICD-10-CM | POA: Insufficient documentation

## 2014-01-27 DIAGNOSIS — F3289 Other specified depressive episodes: Secondary | ICD-10-CM | POA: Insufficient documentation

## 2014-01-27 DIAGNOSIS — H9319 Tinnitus, unspecified ear: Secondary | ICD-10-CM | POA: Insufficient documentation

## 2014-01-27 DIAGNOSIS — F411 Generalized anxiety disorder: Secondary | ICD-10-CM | POA: Insufficient documentation

## 2014-01-27 DIAGNOSIS — F17200 Nicotine dependence, unspecified, uncomplicated: Secondary | ICD-10-CM | POA: Insufficient documentation

## 2014-01-27 DIAGNOSIS — J069 Acute upper respiratory infection, unspecified: Secondary | ICD-10-CM

## 2014-01-27 DIAGNOSIS — R05 Cough: Secondary | ICD-10-CM | POA: Insufficient documentation

## 2014-01-27 DIAGNOSIS — I10 Essential (primary) hypertension: Secondary | ICD-10-CM | POA: Insufficient documentation

## 2014-01-27 DIAGNOSIS — Z79899 Other long term (current) drug therapy: Secondary | ICD-10-CM | POA: Insufficient documentation

## 2014-01-27 DIAGNOSIS — F329 Major depressive disorder, single episode, unspecified: Secondary | ICD-10-CM | POA: Insufficient documentation

## 2014-01-27 DIAGNOSIS — Z7982 Long term (current) use of aspirin: Secondary | ICD-10-CM | POA: Insufficient documentation

## 2014-01-27 MED ORDER — BENZONATATE 100 MG PO CAPS: 100.0000 mg | ORAL_CAPSULE | Freq: Three times a day (TID) | ORAL | Status: DC | PRN

## 2014-01-27 MED ORDER — OXYMETAZOLINE HCL 0.05 % NA SOLN: 1.0000 | Freq: Two times a day (BID) | NASAL | Status: DC

## 2014-01-27 NOTE — ED Notes (Signed)
Pt was seen here 2 days ago for cough. Returns today because cough continues after taking Mucinex and Albuterol Inhaler.

## 2014-01-27 NOTE — ED Provider Notes (Signed)
CSN: [id number]     Arrival date & time 01/27/14  1027 History  This chart was scribed for Clayton Bibles, PA-C, non-physician practitioner working with Babette Relic, MD by Vernell Barrier, ED scribe. This patient was seen in room TR08C/TR08C and the patient's care was started at 11:23 AM.   Chief Complaint  Patient presents with  . Follow-up   The history is provided by the patient. No language interpreter was used.   HPI Comments: Roger [name] is a 58 y.o. male who presents to the Emergency Department complaining of gradually worsening persistent green productive cough; onset 6 days ago. Some rhinorrhea, nasal congestion, sinus pressure, itchy ears, and tinnitus noted as well. Drinking water more frequently he states due to dry mouth. Seen here 2 days ago for cough and diagnosed with a viral infection.  Denies any worsening of his symptoms, but he wants some medication for cough because his cough has been keeping him up at night. Discharged with albuterol and mucinex relief. Using albuterol inhaler twice every 4-6 hours. Using the Mucinex twice daily. No hx of diabetes. Taking Xanax for anxiety and Atenolol for HTN. Denies chest pain, SOB, sore throat, abdominal pain, diarrhea, or leg swelling.   Past Medical History  Diagnosis Date  . Anxiety   . Hypertension   . Depression   . Anxiety    Past Surgical History  Procedure Laterality Date  . Tonsillectomy     Family History  Problem Relation Age of Onset  . Anxiety disorder Mother   . Depression Mother   . Anxiety disorder Sister   . Depression Sister   . Anxiety disorder Brother   . Depression Brother    History  Substance Use Topics  . Smoking status: Current Every Day Smoker -- 0.25 packs/day    Types: Cigarettes  . Smokeless tobacco: Never Used  . Alcohol Use: No    Review of Systems  Constitutional: Negative for fever and chills.  HENT: Positive for congestion, rhinorrhea, sinus pressure, sneezing and tinnitus. Negative  for sore throat.   Respiratory: Positive for cough. Negative for shortness of breath.   Cardiovascular: Negative for chest pain and leg swelling.  Gastrointestinal: Negative for abdominal pain and diarrhea.  All other systems reviewed and are negative.   Allergies  Demerol; Erythromycin; and Sulfa antibiotics  Home Medications   Prior to Admission medications   Medication Sig Start Date End Date Taking? Authorizing Provider  ALPRAZolam Duanne Moron) 1 MG tablet Take 1 mg by mouth 4 (four) times daily.    Historical Provider, MD  aspirin EC 325 MG tablet Take 325 mg by mouth daily.    Historical Provider, MD  atenolol (TENORMIN) 50 MG tablet Take 50 mg by mouth every morning.     Historical Provider, MD  guaiFENesin (MUCINEX) 600 MG 12 hr tablet Take 1 tablet (600 mg total) by mouth 2 (two) times daily as needed for cough or to loosen phlegm. 01/25/14   Jennifer L Piepenbrink, PA-C  ibuprofen (ADVIL,MOTRIN) 200 MG tablet Take 200 mg by mouth every 6 (six) hours as needed for moderate pain (pain).     Historical Provider, MD  magnesium citrate solution Take 296 mLs by mouth once. OTC 01/16/14   Margarita Mail, PA-C  mirtazapine (REMERON) 15 MG tablet Take 15 mg by mouth at bedtime.    Historical Provider, MD  VENTOLIN HFA 108 (90 BASE) MCG/ACT inhaler Inhale 2 puffs into the lungs every 4 (four) hours as needed for wheezing.  10/09/13   Historical Provider, MD   Triage vitals: BP 131/74  Pulse 71  Temp(Src) 98 F (36.7 C) (Oral)  Resp 18  SpO2 98%  Physical Exam  Nursing note and vitals reviewed. Constitutional: He appears well-developed and well-nourished. No distress.  HENT:  Head: Normocephalic and atraumatic.  Right Ear: Tympanic membrane normal.  Left Ear: Tympanic membrane normal.  Mouth/Throat: Posterior oropharyngeal erythema present. No oropharyngeal exudate or posterior oropharyngeal edema.  Exudate in bilateral nostrils. Mild erythema of the turbinates. Frontal and maxillary  sinuses non-tender.   Neck: Neck supple.  Cardiovascular: Normal rate, regular rhythm and normal heart sounds.   No murmur heard. Pulmonary/Chest: Effort normal and breath sounds normal. No respiratory distress. He has no wheezes. He has no rales.  Lymphadenopathy:    He has no cervical adenopathy.  Neurological: He is alert.  Skin: He is not diaphoretic.   ED Course  Procedures (including critical care time) DIAGNOSTIC STUDIES: Oxygen Saturation is 98% on room air, normal by my interpretation.    COORDINATION OF CARE: At 11:30 AM: Discussed treatment plan with patient which includes treatment with sinus spray and cough medicine. Patient agrees.    Labs Review Labs Reviewed - No data to display  Imaging Review Dg Chest 2 View  01/25/2014   CLINICAL DATA:  Four day history of productive cough. Long-time smoker. Current history of hypertension and COPD.  EXAM: CHEST  2 VIEW  COMPARISON:  One view chest x-ray 01/16/2014. Two-view chest x-ray 08/19/2013, 07/17/2013, 07/18/2012. CT chest 08/21/2013.  FINDINGS: Cardiac silhouette normal in size, unchanged. Thoracic aorta minimally atherosclerotic, unchanged. Mildly prominent central pulmonary arteries, unchanged. Emphysematous changes throughout both lungs and prominent bronchovascular markings diffusely with moderate central peribronchial thickening, unchanged. No new pulmonary parenchymal abnormalities. No pleural effusions. No pneumothorax. Visualized bony thorax intact. No significant interval change.  IMPRESSION: COPD/emphysema. No acute cardiopulmonary disease. Stable examination.   Electronically Signed   By: Evangeline Dakin M.D.   On: 01/25/2014 11:50     EKG Interpretation None      MDM   Final diagnoses:  URI (upper respiratory infection)   Afebrile, nontoxic patient with constellation of symptoms suggestive of viral syndrome.  No concerning findings on exam.  Pt returned not for worsening symptoms but because he wanted a  cough medication that did not contain codeine and a work note.  Discharged home with supportive care, PCP follow up. Discussed result, findings, treatment, and follow up  with patient.  Pt given return precautions.  Pt verbalizes understanding and agrees with plan.      I personally performed the services described in this documentation, which was scribed in my presence. The recorded information has been reviewed and is accurate.     Clayton Bibles, PA-C 01/27/14 1203

## 2014-01-27 NOTE — ED Provider Notes (Signed)
Medical screening examination/treatment/procedure(s) were performed by non-physician practitioner and as supervising physician I was immediately available for consultation/collaboration.   EKG Interpretation None       Babette Relic, MD 01/27/14 2224

## 2014-01-27 NOTE — Discharge Instructions (Signed)
Read the information below.  Use the prescribed medication as directed.  Please discuss all new medications with your pharmacist.  You may return to the Emergency Department at any time for worsening condition or any new symptoms that concern you.  If you develop high fevers that do not resolve with tylenol or ibuprofen, you have difficulty swallowing or breathing, or you are unable to tolerate fluids by mouth, return to the ER for a recheck.      Upper Respiratory Infection, Adult An upper respiratory infection (URI) is also known as the common cold. It is often caused by a type of germ (virus). Colds are easily spread (contagious). You can pass it to others by kissing, coughing, sneezing, or drinking out of the same glass. Usually, you get better in 1 or 2 weeks.  HOME CARE   Only take medicine as told by your doctor.  Use a warm mist humidifier or breathe in steam from a hot shower.  Drink enough water and fluids to keep your pee (urine) clear or pale yellow.  Get plenty of rest.  Return to work when your temperature is back to normal or as told by your doctor. You may use a face mask and wash your hands to stop your cold from spreading. GET HELP RIGHT AWAY IF:   After the first few days, you feel you are getting worse.  You have questions about your medicine.  You have chills, shortness of breath, or brown or red spit (mucus).  You have yellow or brown snot (nasal discharge) or pain in the face, especially when you bend forward.  You have a fever, puffy (swollen) neck, pain when you swallow, or white spots in the back of your throat.  You have a bad headache, ear pain, sinus pain, or chest pain.  You have a high-pitched whistling sound when you breathe in and out (wheezing).  You have a lasting cough or cough up blood.  You have sore muscles or a stiff neck. MAKE SURE YOU:   Understand these instructions.  Will watch your condition.  Will get help right away if you are  not doing well or get worse. Document Released: 12/07/2007 Document Revised: 09/12/2011 Document Reviewed: 09/25/2013 [facility] Patient Information 2015 [place], [place]. This information is not intended to replace advice given to you by your health care provider. Make sure you discuss any questions you have with your health care provider.  Viral Infections A virus is a type of germ. Viruses can cause:  Minor sore throats.  Aches and pains.  Headaches.  Runny nose.  Rashes.  Watery eyes.  Tiredness.  Coughs.  Loss of appetite.  Feeling sick to your stomach (nausea).  Throwing up (vomiting).  Watery poop (diarrhea). HOME CARE   Only take medicines as told by your doctor.  Drink enough water and fluids to keep your pee (urine) clear or pale yellow. Sports drinks are a good choice.  Get plenty of rest and eat healthy. Soups and broths with crackers or rice are fine. GET HELP RIGHT AWAY IF:   You have a very bad headache.  You have shortness of breath.  You have chest pain or neck pain.  You have an unusual rash.  You cannot stop throwing up.  You have watery poop that does not stop.  You cannot keep fluids down.  You or your child has a temperature by mouth above 102 F (38.9 C), not controlled by medicine.  Your baby is older than 3  months with a rectal temperature of 102 F (38.9 C) or higher.  Your baby is 75 months old or younger with a rectal temperature of 100.4 F (38 C) or higher. MAKE SURE YOU:   Understand these instructions.  Will watch this condition.  Will get help right away if you are not doing well or get worse. Document Released: 06/02/2008 Document Revised: 09/12/2011 Document Reviewed: 10/26/2010 [facility] Patient Information 2015 [place], [place]. This information is not intended to replace advice given to you by your health care provider. Make sure you discuss any questions you have with your health care provider.

## 2014-03-11 ENCOUNTER — Emergency Department (HOSPITAL_COMMUNITY): Payer: BC Managed Care – PPO

## 2014-03-11 ENCOUNTER — Encounter (HOSPITAL_COMMUNITY): Payer: Self-pay | Admitting: Emergency Medicine

## 2014-03-11 ENCOUNTER — Emergency Department (HOSPITAL_COMMUNITY)
Admission: EM | Admit: 2014-03-11 | Discharge: 2014-03-12 | Disposition: A | Discharge status: Home / Self Care | Payer: BC Managed Care – PPO | Attending: Emergency Medicine | Admitting: Emergency Medicine

## 2014-03-11 DIAGNOSIS — F411 Generalized anxiety disorder: Secondary | ICD-10-CM | POA: Insufficient documentation

## 2014-03-11 DIAGNOSIS — J449 Chronic obstructive pulmonary disease, unspecified: Secondary | ICD-10-CM | POA: Insufficient documentation

## 2014-03-11 DIAGNOSIS — J189 Pneumonia, unspecified organism: Secondary | ICD-10-CM

## 2014-03-11 DIAGNOSIS — I10 Essential (primary) hypertension: Secondary | ICD-10-CM | POA: Diagnosis not present

## 2014-03-11 DIAGNOSIS — F329 Major depressive disorder, single episode, unspecified: Secondary | ICD-10-CM | POA: Diagnosis not present

## 2014-03-11 DIAGNOSIS — J4489 Other specified chronic obstructive pulmonary disease: Secondary | ICD-10-CM | POA: Insufficient documentation

## 2014-03-11 DIAGNOSIS — J159 Unspecified bacterial pneumonia: Secondary | ICD-10-CM | POA: Insufficient documentation

## 2014-03-11 DIAGNOSIS — Z7982 Long term (current) use of aspirin: Secondary | ICD-10-CM | POA: Diagnosis not present

## 2014-03-11 DIAGNOSIS — K920 Hematemesis: Secondary | ICD-10-CM | POA: Diagnosis present

## 2014-03-11 DIAGNOSIS — Z79899 Other long term (current) drug therapy: Secondary | ICD-10-CM | POA: Insufficient documentation

## 2014-03-11 DIAGNOSIS — F3289 Other specified depressive episodes: Secondary | ICD-10-CM | POA: Insufficient documentation

## 2014-03-11 DIAGNOSIS — F17200 Nicotine dependence, unspecified, uncomplicated: Secondary | ICD-10-CM | POA: Insufficient documentation

## 2014-03-11 HISTORY — DX: Unspecified asthma, uncomplicated: J45.909

## 2014-03-11 HISTORY — DX: Chronic obstructive pulmonary disease, unspecified: J44.9

## 2014-03-11 LAB — I-STAT CHEM 8, ED
BUN: 5 mg/dL — ABNORMAL LOW (ref 6–23)
CHLORIDE: 102 meq/L (ref 96–112)
CREATININE: 0.8 mg/dL (ref 0.50–1.35)
Calcium, Ion: 1.17 mmol/L (ref 1.12–1.23)
GLUCOSE: 98 mg/dL (ref 70–99)
HEMATOCRIT: 44 % (ref 39.0–52.0)
Hemoglobin: 15 g/dL (ref 13.0–17.0)
Potassium: 4 mEq/L (ref 3.7–5.3)
Sodium: 138 mEq/L (ref 137–147)
TCO2: 28 mmol/L (ref 0–100)

## 2014-03-11 LAB — CBC WITH DIFFERENTIAL/PLATELET
BASOS PCT: 0 % (ref 0–1)
Basophils Absolute: 0 10*3/uL (ref 0.0–0.1)
Eosinophils Absolute: 0.2 10*3/uL (ref 0.0–0.7)
Eosinophils Relative: 3 % (ref 0–5)
HCT: 39.8 % (ref 39.0–52.0)
HEMOGLOBIN: 14.3 g/dL (ref 13.0–17.0)
LYMPHS ABS: 2.1 10*3/uL (ref 0.7–4.0)
Lymphocytes Relative: 28 % (ref 12–46)
MCH: 32.6 pg (ref 26.0–34.0)
MCHC: 35.9 g/dL (ref 30.0–36.0)
MCV: 90.9 fL (ref 78.0–100.0)
MONOS PCT: 6 % (ref 3–12)
Monocytes Absolute: 0.5 10*3/uL (ref 0.1–1.0)
NEUTROS ABS: 4.6 10*3/uL (ref 1.7–7.7)
NEUTROS PCT: 63 % (ref 43–77)
Platelets: 172 10*3/uL (ref 150–400)
RBC: 4.38 MIL/uL (ref 4.22–5.81)
RDW: 12.3 % (ref 11.5–15.5)
WBC: 7.3 10*3/uL (ref 4.0–10.5)

## 2014-03-11 LAB — I-STAT TROPONIN, ED: Troponin i, poc: 0 ng/mL (ref 0.00–0.08)

## 2014-03-11 LAB — PRO B NATRIURETIC PEPTIDE: Pro B Natriuretic peptide (BNP): 68.5 pg/mL (ref 0–125)

## 2014-03-11 MED ORDER — IOHEXOL 350 MG/ML SOLN
100.0000 mL | Freq: Once | INTRAVENOUS | Status: AC | PRN
  Administered 2014-03-11: 100 mL via INTRAVENOUS

## 2014-03-11 MED ORDER — LEVOFLOXACIN 750 MG PO TABS: 750.0000 mg | ORAL_TABLET | Freq: Every day | ORAL | Status: DC

## 2014-03-11 MED ORDER — SODIUM CHLORIDE 0.9 % IV BOLUS (SEPSIS)
1000.0000 mL | Freq: Once | INTRAVENOUS | Status: AC
  Administered 2014-03-11: 1000 mL via INTRAVENOUS

## 2014-03-11 NOTE — ED Provider Notes (Signed)
CSN: [id number]     Arrival date & time 03/11/14  2058 History   First MD Initiated Contact with Patient 03/11/14 2101     Chief Complaint  Patient presents with  . Hematemesis     (Consider location/radiation/quality/duration/timing/severity/associated sxs/prior Treatment) HPI Comments: Roger [name] 58 y.o. With a pmh of COPD, HTN who presents to the ED for hemoptysis. He has had a cough for several days and is became productive of blood suddenly today. He reports <1/8 of a cup of blood production. He reports "What if it is a blood clot". Denies any chest pain or shortness of breath.   Patient is a 58 y.o. male presenting with cough. The history is provided by the patient.  Cough Cough characteristics:  Productive Sputum characteristics:  Bloody and white Severity:  Moderate Onset quality:  Gradual Duration:  1 day Timing:  Intermittent Progression:  Improving Chronicity:  Recurrent (Had hemoptysis earlier this year and was worked up with "PFTs, a CT scan, and MRI" all of which were "normal") Smoker: yes   Context comment:  Recent cough that has been worsening Relieved by:  Nothing Worsened by:  Nothing tried Ineffective treatments:  None tried Associated symptoms: sinus congestion   Associated symptoms: no chest pain, no chills, no diaphoresis, no ear fullness, no ear pain, no eye discharge, no fever, no headaches, no myalgias, no rash, no rhinorrhea, no shortness of breath, no sore throat, no weight loss and no wheezing   Associated symptoms comment:  Lightheadedness Risk factors comment:  Recent allergies   Past Medical History  Diagnosis Date  . Anxiety   . Hypertension   . Depression   . Anxiety   . COPD (chronic obstructive pulmonary disease)   . Asthma    Past Surgical History  Procedure Laterality Date  . Tonsillectomy     Family History  Problem Relation Age of Onset  . Anxiety disorder Mother   . Depression Mother   . Anxiety disorder Sister   . Depression  Sister   . Anxiety disorder Brother   . Depression Brother    History  Substance Use Topics  . Smoking status: Current Every Day Smoker -- 0.25 packs/day    Types: Cigarettes  . Smokeless tobacco: Never Used  . Alcohol Use: No    Review of Systems  Constitutional: Negative for fever, chills, weight loss and diaphoresis.  HENT: Negative for ear pain, rhinorrhea and sore throat.   Eyes: Negative for discharge.  Respiratory: Positive for cough. Negative for shortness of breath and wheezing.   Cardiovascular: Negative for chest pain.  Musculoskeletal: Negative for myalgias.  Skin: Negative for rash.  Neurological: Negative for headaches.  All other systems reviewed and are negative.     Allergies  Demerol; Erythromycin; and Sulfa antibiotics  Home Medications   Prior to Admission medications   Medication Sig Start Date End Date Taking? Authorizing Provider  ALPRAZolam Duanne Moron) 1 MG tablet Take 1 mg by mouth 4 (four) times daily.   Yes Historical Provider, MD  aspirin EC 325 MG tablet Take 325 mg by mouth daily.   Yes Historical Provider, MD  atenolol (TENORMIN) 50 MG tablet Take 50 mg by mouth every morning.    Yes Historical Provider, MD  ibuprofen (ADVIL,MOTRIN) 200 MG tablet Take 200 mg by mouth every 6 (six) hours as needed for moderate pain (pain).    Yes Historical Provider, MD  mirtazapine (REMERON) 15 MG tablet Take 15 mg by mouth at bedtime.  Yes Historical Provider, MD  levofloxacin (LEVAQUIN) 750 MG tablet Take 1 tablet (750 mg total) by mouth daily. X 7 days 03/11/14   Kelby Aline, MD   BP 132/52  Pulse 53  Temp(Src) 98.1 F (36.7 C) (Oral)  Resp 16  Ht 5\' 11"  (1.803 m)  Wt 189 lb (85.73 kg)  BMI 26.37 kg/m2  SpO2 98% Physical Exam  Nursing note and vitals reviewed. Constitutional: He is oriented to person, place, and time. He appears well-developed and well-nourished. No distress.  Actively coughing on exam and noted by myself to be productive of bloody  sputum  HENT:  Head: Normocephalic and atraumatic.  Eyes: Conjunctivae and EOM are normal. Right eye exhibits no discharge. Left eye exhibits no discharge.  Neck: Normal range of motion. Neck supple. No tracheal deviation present.  Cardiovascular: Normal rate, regular rhythm and normal heart sounds.  Exam reveals no friction rub.   No murmur heard. Pulmonary/Chest: Effort normal and breath sounds normal. No stridor. No respiratory distress. He has no wheezes. He has no rales. He exhibits no tenderness.  Abdominal: Soft. He exhibits no distension. There is no tenderness. There is no rebound and no guarding.  Musculoskeletal:       Right upper leg: He exhibits no tenderness, no bony tenderness, no swelling and no edema.       Left upper leg: He exhibits no tenderness, no bony tenderness, no swelling and no edema.       Right lower leg: He exhibits no tenderness, no bony tenderness, no swelling and no edema.       Left lower leg: He exhibits no tenderness, no bony tenderness, no swelling and no edema.  Homans negative bilaterally  Neurological: He is alert and oriented to person, place, and time.  Skin: Skin is warm.  Psychiatric: He has a normal mood and affect.    ED Course  Procedures (including critical care time) Labs Review Labs Reviewed  I-STAT CHEM 8, ED - Abnormal; Notable for the following:    BUN 5 (*)    All other components within normal limits  CBC WITH DIFFERENTIAL  PRO B NATRIURETIC PEPTIDE  I-STAT TROPOININ, ED    Imaging Review Ct Angio Chest Pe W/cm &/or Wo Cm  03/11/2014   CLINICAL DATA:  Hemoptysis  EXAM: CT ANGIOGRAPHY CHEST WITH CONTRAST  TECHNIQUE: Multidetector CT imaging of the chest was performed using the standard protocol during bolus administration of intravenous contrast. Multiplanar CT image reconstructions and MIPs were obtained to evaluate the vascular anatomy.  CONTRAST:  149mL OMNIPAQUE IOHEXOL 350 MG/ML SOLN  COMPARISON:  CT chest dated 08/21/2013   FINDINGS: No evidence of pulmonary embolism.  Multifocal patchy opacity in the right upper and middle lobes, suspicious for pneumonia or possibly pulmonary hemorrhage (given the clinical history).  Additional mild dependent patchy opacity in the bilateral lower lobes, likely atelectasis.  Underlying moderate centrilobular and paraseptal emphysematous changes. No pleural effusion or pneumothorax.  Visualized thyroid is unremarkable.  Heart is normal in size. No pericardial effusion. Coronary atherosclerosis. Atherosclerotic calcifications of the aortic arch.  Small mediastinal lymph nodes measuring up to 8-9 mm, likely reactive.  Visualized upper abdomen is unremarkable.  Mild degenerative changes of the visualized thoracolumbar spine.  Review of the MIP images confirms the above findings.  IMPRESSION: No evidence of pulmonary embolism.  Multifocal patchy opacity in the right upper and middle lobes, suspicious for pneumonia or possibly pulmonary hemorrhage (given the clinical history).  Underlying moderate emphysematous changes.  Electronically Signed   By: Julian Hy M.D.   On: 03/11/2014 23:29     EKG Interpretation None      MDM   Final diagnoses:  Community acquired pneumonia    Pt presents with hemoptysis and concern of pulmonary embolism. Associated with lightheadedness. No work up for PE in the past, although a CT wo contrast is noted in the rec that was neg for specific masses or cavitary lesions in February 2015. AFVSS. HDS. NAD. Given hemoptysis, lightheadedness and patient specific concern for pulmonary embolism - will work up for PE. Risks and benefits regarding IV contrast and radiation were discussed with the patient at length, including contrast induced nephropathy and radiation exposure, which the patient understood. Hydrated with 1 L IVF. ECG neg for ischemic dynamic changes. Trop neg and BNP neg. No leukocytosis. Hgb stable and neat his baseline. CT neg for PE, but c/w PNA.  Started Levaquin for CAP. Patient refused Levquin in the ED as he did not eat this evening and did not want to become nauseated. He will fill the rx and started taking in the am, which is reasonable as he appears well. If he does not tolerate the abx, fu with his pcp to have it changed. Return to the ED for shortness of breath, fever, sweats, or any other symptoms of progressing PNA. Also counseled about the risks of smoking and smoking cessation.    Strong return precautions given for worsening symptoms or any other alarming or concerning symptoms or issues. The patient was in agreement with the treatment plan and I answered all of their questions. The patient was stable for dc. At dc, the patient ambulated without difficulty, was moving all four extremities, symptoms improved, NAD. and AOx4 Care discussed with my attending, Dr. Tammy Sours. If performed and available, imaging studies and labs reviewed.    Kelby Aline, MD 03/12/14 [phone]

## 2014-03-11 NOTE — ED Notes (Signed)
EKG completed and given to Doctor.

## 2014-03-11 NOTE — ED Notes (Signed)
Per EMS- pt was at his job and started coughing up blood about an hour ago. Consistent since then. No hx of ETOH. PIV 20 G L WRIST

## 2014-03-12 ENCOUNTER — Telehealth (HOSPITAL_COMMUNITY): Payer: Self-pay

## 2014-03-12 ENCOUNTER — Telehealth (HOSPITAL_BASED_OUTPATIENT_CLINIC_OR_DEPARTMENT_OTHER): Payer: Self-pay | Admitting: Emergency Medicine

## 2014-03-12 NOTE — Telephone Encounter (Signed)
Patient was at brother's house (out of town) and has misplaced about 7 days of Remeron , Xanax and Atenolol(has contacted pre scriber for this medication)  Per patient chart: Last RX for Remeron and Xanax given on 12/24/13 - 30 day supply with 2 refills. Would have last until 03/26/14 his next scheduled appt - which has now been changed to 04/03/14, and only has enough of those medicines to last until 03/18/14.  Patient requests RX for enough of those two medicines to last from 03/18/14 to 04/03/14 - approximately 15 day supply

## 2014-03-14 NOTE — ED Provider Notes (Signed)
I saw and evaluated the patient, reviewed the resident's note and I agree with the findings and plan.   EKG Interpretation   Date/Time:  Tuesday March 11 2014 21:35:12 EDT Ventricular Rate:  57 PR Interval:  138 QRS Duration: 100 QT Interval:  422 QTC Calculation: 411 R Axis:   67 Text Interpretation:  Sinus rhythm ED PHYSICIAN INTERPRETATION AVAILABLE  IN [facility] Confirmed by TEST, Record ([id number]) on 03/13/2014 7:22:06  AM      Pt with hemoptysis. Stable EKG, vitals signs.  We will r/o PE. Exam is benign.   Varney Biles, MD 03/14/14 [phone]

## 2014-03-15 ENCOUNTER — Emergency Department (HOSPITAL_COMMUNITY): Payer: BC Managed Care – PPO

## 2014-03-15 ENCOUNTER — Emergency Department (HOSPITAL_COMMUNITY)
Admission: EM | Admit: 2014-03-15 | Discharge: 2014-03-15 | Disposition: A | Discharge status: Home / Self Care | Payer: BC Managed Care – PPO | Attending: Emergency Medicine | Admitting: Emergency Medicine

## 2014-03-15 ENCOUNTER — Encounter (HOSPITAL_COMMUNITY): Payer: Self-pay | Admitting: Emergency Medicine

## 2014-03-15 DIAGNOSIS — J441 Chronic obstructive pulmonary disease with (acute) exacerbation: Secondary | ICD-10-CM | POA: Diagnosis not present

## 2014-03-15 DIAGNOSIS — Z792 Long term (current) use of antibiotics: Secondary | ICD-10-CM | POA: Diagnosis not present

## 2014-03-15 DIAGNOSIS — F17200 Nicotine dependence, unspecified, uncomplicated: Secondary | ICD-10-CM | POA: Diagnosis not present

## 2014-03-15 DIAGNOSIS — F411 Generalized anxiety disorder: Secondary | ICD-10-CM | POA: Insufficient documentation

## 2014-03-15 DIAGNOSIS — F329 Major depressive disorder, single episode, unspecified: Secondary | ICD-10-CM | POA: Insufficient documentation

## 2014-03-15 DIAGNOSIS — Z7982 Long term (current) use of aspirin: Secondary | ICD-10-CM | POA: Insufficient documentation

## 2014-03-15 DIAGNOSIS — F419 Anxiety disorder, unspecified: Secondary | ICD-10-CM

## 2014-03-15 DIAGNOSIS — R042 Hemoptysis: Secondary | ICD-10-CM | POA: Insufficient documentation

## 2014-03-15 DIAGNOSIS — J159 Unspecified bacterial pneumonia: Secondary | ICD-10-CM | POA: Diagnosis not present

## 2014-03-15 DIAGNOSIS — Z79899 Other long term (current) drug therapy: Secondary | ICD-10-CM | POA: Diagnosis not present

## 2014-03-15 DIAGNOSIS — J45901 Unspecified asthma with (acute) exacerbation: Secondary | ICD-10-CM

## 2014-03-15 DIAGNOSIS — I10 Essential (primary) hypertension: Secondary | ICD-10-CM | POA: Diagnosis not present

## 2014-03-15 DIAGNOSIS — F3289 Other specified depressive episodes: Secondary | ICD-10-CM | POA: Insufficient documentation

## 2014-03-15 DIAGNOSIS — J189 Pneumonia, unspecified organism: Secondary | ICD-10-CM

## 2014-03-15 LAB — CBC WITH DIFFERENTIAL/PLATELET
Basophils Absolute: 0 10*3/uL (ref 0.0–0.1)
Basophils Relative: 0 % (ref 0–1)
Eosinophils Absolute: 0.2 10*3/uL (ref 0.0–0.7)
Eosinophils Relative: 2 % (ref 0–5)
HEMATOCRIT: 43.5 % (ref 39.0–52.0)
Hemoglobin: 15.3 g/dL (ref 13.0–17.0)
Lymphocytes Relative: 21 % (ref 12–46)
Lymphs Abs: 1.9 10*3/uL (ref 0.7–4.0)
MCH: 32.3 pg (ref 26.0–34.0)
MCHC: 35.2 g/dL (ref 30.0–36.0)
MCV: 92 fL (ref 78.0–100.0)
MONO ABS: 0.8 10*3/uL (ref 0.1–1.0)
Monocytes Relative: 9 % (ref 3–12)
NEUTROS ABS: 6.2 10*3/uL (ref 1.7–7.7)
Neutrophils Relative %: 68 % (ref 43–77)
Platelets: 176 10*3/uL (ref 150–400)
RBC: 4.73 MIL/uL (ref 4.22–5.81)
RDW: 12.3 % (ref 11.5–15.5)
WBC: 9 10*3/uL (ref 4.0–10.5)

## 2014-03-15 NOTE — Discharge Instructions (Signed)
Hemoptysis Hemoptysis, which means coughing up blood, can be a sign of a minor problem or a serious medical condition. The blood that is coughed up may come from the lungs and airways. Coughed-up blood can also come from bleeding that occurs outside the lungs and airways. Blood can drain into the windpipe during a severe nosebleed or when blood is vomited from the stomach. Because hemoptysis can be a sign of something serious, a medical evaluation is required. For some people with hemoptysis, no definite cause is ever identified. CAUSES  The most common cause of hemoptysis is bronchitis. Some other common causes include:   A ruptured blood vessel caused by coughing or an infection.   A medical condition that causes damage to the large air passageways (bronchiectasis).   A blood clot in the lungs (pulmonary embolism).   Pneumonia.   Tuberculosis.   Breathing in a small foreign object.   Cancer. For some people with hemoptysis, no definite cause is ever identified.  HOME CARE INSTRUCTIONS  Only take over-the-counter or prescription medicines as directed by your caregiver. Do not use cough suppressants unless your caregiver approves.  If your caregiver prescribes antibiotic medicines, take them as directed. Finish them even if you start to feel better.  Do not smoke. Also avoid secondhand smoke.  Follow up with your caregiver as directed. SEEK IMMEDIATE MEDICAL CARE IF:   You cough up bloody mucus for longer than a week.  You have a blood-producing cough that is severe or getting worse.  You have a blood-producing cough thatcomes and goes over time.  You develop problems with your breathing.   You vomit blood.  You develop bloody or black-colored stools.  You have chest pain.   You develop night sweats.  You feel faint or pass out.   You have a fever or persistent symptoms for more than 2-3 days.  You have a fever and your symptoms suddenly get worse. MAKE  SURE YOU:  Understand these instructions.  Will watch your condition.  Will get help right away if you are not doing well or get worse. Document Released: 08/29/2001 Document Revised: 06/06/2012 Document Reviewed: 04/06/2012 [facility] Patient Information 2015 [place], [place]. This information is not intended to replace advice given to you by your health care provider. Make sure you discuss any questions you have with your health care provider.  Pneumonia Pneumonia is an infection of the lungs.  CAUSES Pneumonia may be caused by bacteria or a virus. Usually, these infections are caused by breathing infectious particles into the lungs (respiratory tract). SIGNS AND SYMPTOMS   Cough.  Fever.  Chest pain.  Increased rate of breathing.  Wheezing.  Mucus production. DIAGNOSIS  If you have the common symptoms of pneumonia, your health care provider will typically confirm the diagnosis with a chest X-ray. The X-ray will show an abnormality in the lung (pulmonary infiltrate) if you have pneumonia. Other tests of your blood, urine, or sputum may be done to find the specific cause of your pneumonia. Your health care provider may also do tests (blood gases or pulse oximetry) to see how well your lungs are working. TREATMENT  Some forms of pneumonia may be spread to other people when you cough or sneeze. You may be asked to wear a mask before and during your exam. Pneumonia that is caused by bacteria is treated with antibiotic medicine. Pneumonia that is caused by the influenza virus may be treated with an antiviral medicine. Most other viral infections must run their  course. These infections will not respond to antibiotics.  HOME CARE INSTRUCTIONS   Cough suppressants may be used if you are losing too much rest. However, coughing protects you by clearing your lungs. You should avoid using cough suppressants if you can.  Your health care provider may have prescribed medicine if he or she thinks  your pneumonia is caused by bacteria or influenza. Finish your medicine even if you start to feel better.  Your health care provider may also prescribe an expectorant. This loosens the mucus to be coughed up.  Take medicines only as directed by your health care provider.  Do not smoke. Smoking is a common cause of bronchitis and can contribute to pneumonia. If you are a smoker and continue to smoke, your cough may last several weeks after your pneumonia has cleared.  A cold steam vaporizer or humidifier in your room or home may help loosen mucus.  Coughing is often worse at night. Sleeping in a semi-upright position in a recliner or using a couple pillows under your head will help with this.  Get rest as you feel it is needed. Your body will usually let you know when you need to rest. PREVENTION A pneumococcal shot (vaccine) is available to prevent a common bacterial cause of pneumonia. This is usually suggested for:  People over 36 years old.  Patients on chemotherapy.  People with chronic lung problems, such as bronchitis or emphysema.  People with immune system problems. If you are over 65 or have a high risk condition, you may receive the pneumococcal vaccine if you have not received it before. In some countries, a routine influenza vaccine is also recommended. This vaccine can help prevent some cases of pneumonia.You may be offered the influenza vaccine as part of your care. If you smoke, it is time to quit. You may receive instructions on how to stop smoking. Your health care provider can provide medicines and counseling to help you quit. SEEK MEDICAL CARE IF: You have a fever. SEEK IMMEDIATE MEDICAL CARE IF:   Your illness becomes worse. This is especially true if you are elderly or weakened from any other disease.  You cannot control your cough with suppressants and are losing sleep.  You begin coughing up blood.  You develop pain which is getting worse or is uncontrolled  with medicines.  Any of the symptoms which initially brought you in for treatment are getting worse rather than better.  You develop shortness of breath or chest pain. MAKE SURE YOU:   Understand these instructions.  Will watch your condition.  Will get help right away if you are not doing well or get worse. Document Released: 06/20/2005 Document Revised: 11/04/2013 Document Reviewed: 09/09/2010 [facility] Patient Information 2015 [place], [place]. This information is not intended to replace advice given to you by your health care provider. Make sure you discuss any questions you have with your health care provider.

## 2014-03-15 NOTE — ED Notes (Signed)
Pt reports coughing up blood since Tuesday. Pt seen here for same and given prescription meds. Pt reports not getting any better.

## 2014-03-15 NOTE — ED Notes (Signed)
Denies N/V/D. Reports feeling warm this morning.

## 2014-03-15 NOTE — ED Provider Notes (Signed)
CSN: [id number]     Arrival date & time 03/15/14  1337 History   First MD Initiated Contact with Patient 03/15/14 1531     Chief Complaint  Patient presents with  . Coughing up blood      (Consider location/radiation/quality/duration/timing/severity/associated sxs/prior Treatment) The history is provided by the patient.   patient presents with continued hemoptysis. States when he coughs his head with a little blood coming up. He was seen in the ER for similar symptoms 4 days ago no diagnosis acquired pneumonia. He had CT angiography done. No fevers. No other bleeding. No chest pain. No lightheadedness dizziness. No shortness of breath. Patient does have a history of anxiety. No blood in stool. He states that he is coughing up is mostly red.  Past Medical History  Diagnosis Date  . Anxiety   . Hypertension   . Depression   . Anxiety   . COPD (chronic obstructive pulmonary disease)   . Asthma    Past Surgical History  Procedure Laterality Date  . Tonsillectomy     Family History  Problem Relation Age of Onset  . Anxiety disorder Mother   . Depression Mother   . Anxiety disorder Sister   . Depression Sister   . Anxiety disorder Brother   . Depression Brother    History  Substance Use Topics  . Smoking status: Current Every Day Smoker -- 0.25 packs/day    Types: Cigarettes  . Smokeless tobacco: Never Used  . Alcohol Use: No    Review of Systems  Constitutional: Negative for fever, activity change, appetite change and fatigue.  Eyes: Negative for pain.  Respiratory: Positive for cough. Negative for chest tightness and shortness of breath.        Hemoptysis  Cardiovascular: Negative for chest pain and leg swelling.  Gastrointestinal: Negative for nausea, vomiting, abdominal pain and diarrhea.  Genitourinary: Negative for flank pain.  Musculoskeletal: Negative for back pain and neck stiffness.  Skin: Negative for rash.  Neurological: Negative for weakness, numbness and  headaches.  Hematological: Does not bruise/bleed easily.  Psychiatric/Behavioral: Negative for behavioral problems.      Allergies  Demerol; Erythromycin; and Sulfa antibiotics  Home Medications   Prior to Admission medications   Medication Sig Start Date End Date Taking? Authorizing Provider  ALPRAZolam Duanne Moron) 1 MG tablet Take 1 mg by mouth 4 (four) times daily.    Historical Provider, MD  aspirin EC 325 MG tablet Take 325 mg by mouth daily.    Historical Provider, MD  atenolol (TENORMIN) 50 MG tablet Take 50 mg by mouth every morning.     Historical Provider, MD  ibuprofen (ADVIL,MOTRIN) 200 MG tablet Take 200 mg by mouth every 6 (six) hours as needed for moderate pain (pain).     Historical Provider, MD  levofloxacin (LEVAQUIN) 750 MG tablet Take 1 tablet (750 mg total) by mouth daily. X 7 days 03/11/14   Kelby Aline, MD  mirtazapine (REMERON) 15 MG tablet Take 15 mg by mouth at bedtime.    Historical Provider, MD   BP 130/63  Pulse 52  Temp(Src) 98.1 F (36.7 C) (Oral)  Resp 17  Ht 5' 11.75" (1.822 m)  Wt 191 lb (86.637 kg)  BMI 26.10 kg/m2  SpO2 98% Physical Exam  Nursing note and vitals reviewed. Constitutional: He is oriented to person, place, and time. He appears well-developed and well-nourished.  HENT:  Head: Normocephalic and atraumatic.  Eyes: Pupils are equal, round, and reactive to light.  Cardiovascular: Normal  rate, regular rhythm and normal heart sounds.   No murmur heard. Pulmonary/Chest: Effort normal.  Scattered wheezes right lung field.  Abdominal: Soft. Bowel sounds are normal. He exhibits no distension and no mass. There is no tenderness. There is no rebound and no guarding.  Musculoskeletal: Normal range of motion. He exhibits no edema.  Neurological: He is alert and oriented to person, place, and time. No cranial nerve deficit.  Skin: Skin is warm and dry. No erythema. No pallor.  Psychiatric: He has a normal mood and affect.    ED Course   Procedures (including critical care time) Labs Review Labs Reviewed  CBC WITH DIFFERENTIAL    Imaging Review Dg Chest 2 View  03/15/2014   CLINICAL DATA:  Coughing up blood.  EXAM: CHEST  2 VIEW  COMPARISON:  CT chest 03/11/2014 and chest radiograph 01/25/2014.  FINDINGS: Heart size is upper normal and stable. Stable mediastinal and hilar contours. Trachea is midline. Lung volumes are upper normal. There are emphysematous changes bilaterally. Faint patchy airspace disease is seen in the right upper lobe, similar in appearance to chest CT dated 03/11/2014. No new areas of airspace disease are appreciated. Negative for pleural effusion or pneumothorax. No acute osseous abnormality.  IMPRESSION: Right upper lobe airspace disease appears without significant change compared to CT chest dated 03/11/2014. Findings could reflect pneumonia or pulmonary hemorrhage. Followup to clearing is recommended.  Emphysema.   Electronically Signed   By: Curlene Dolphin M.D.   On: 03/15/2014 16:18     EKG Interpretation None      MDM   Final diagnoses:  Community acquired pneumonia  Hemoptysis  Anxiety   Patient with return visit for hemoptysis. Seen 4 days ago and diagnosed with community acquired pneumonia. CT angiography showed pneumonia versus pulmonary hemorrhage. Continued to have hemoptysis. He states he has followup with Dr. work on Monday.  Will discharge home. Hemoglobin stable. X-ray stable. In no respiratory distress.  Jasper Riling. Alvino Chapel, MD 03/15/14 [phone]

## 2014-03-15 NOTE — ED Notes (Signed)
MD at bedside. 

## 2014-03-17 ENCOUNTER — Telehealth: Payer: Self-pay | Admitting: Internal Medicine

## 2014-03-17 ENCOUNTER — Ambulatory Visit (INDEPENDENT_AMBULATORY_CARE_PROVIDER_SITE_OTHER): Payer: BC Managed Care – PPO | Admitting: Internal Medicine

## 2014-03-17 ENCOUNTER — Encounter: Payer: Self-pay | Admitting: Internal Medicine

## 2014-03-17 VITALS — BP 140/90 | HR 65 | Temp 98.2°F | Ht 71.0 in | Wt 190.4 lb

## 2014-03-17 DIAGNOSIS — F17200 Nicotine dependence, unspecified, uncomplicated: Secondary | ICD-10-CM

## 2014-03-17 DIAGNOSIS — F17210 Nicotine dependence, cigarettes, uncomplicated: Secondary | ICD-10-CM

## 2014-03-17 DIAGNOSIS — J189 Pneumonia, unspecified organism: Secondary | ICD-10-CM

## 2014-03-17 MED ORDER — ALPRAZOLAM 1 MG PO TABS: 1.0000 mg | ORAL_TABLET | Freq: Four times a day (QID) | ORAL | Status: DC

## 2014-03-17 MED ORDER — MIRTAZAPINE 15 MG PO TABS: 15.0000 mg | ORAL_TABLET | Freq: Every day | ORAL | Status: DC

## 2014-03-17 NOTE — Telephone Encounter (Addendum)
Eudelia Bunch, NP (in Dr. Marguerite Olea absence) reviewed [place] Controlled Substance Reporting System to verify dates of last Xanax fill. Per NP, may give enough Remeron and Xanax to bridge pt to next appt on 04/03/14.he stated he had enough to last until 9/15, so this will be a 16 day supply. Contacted patient.He requests Remeron be sent to [facility] (on record- [address])  and [facility] (on [address]). He states uses two dofferent pharmacies due to cost. Last Sig on Xanax RX dated 12/24/13: Take 1 tab 4 times a day and 1/2 tab as needed. 16 day supply = 72 tablets

## 2014-03-17 NOTE — Addendum Note (Signed)
Addended by: Rolland Bimler on: 03/17/2014 11:51 AM   Modules accepted: Orders

## 2014-03-17 NOTE — Progress Notes (Signed)
Subjective:    Patient ID: Roger [name], male    DOB: 12-17-1955   MRN: [id number]  HPI  14  yowm active smoker referred to pulmonary clinic 03/17/2014 for new onset hemoptysis by Dr Dorthy Cooler.   03/17/2014 1st [facility] office visit/ Pluma Diniz   Chief Complaint  Patient presents with  . Pulmonary Consult    Referred by Dr. Lujean Amel. Pt states that he was dxed with PNA about 1 wk ago.  He states that he started to have hemoptysis and went to ED and had CT Chest.  He is currently being txed with Levaquin, but still having minimal hemoptysis daily.   acute sneeze/ cough walking into locker room > immediately to ER dx with CAP and rx with levaquin x 7d.   No epistaxis, min chills, no fever or cp, some sore throat but resolved.  Max bleeding was sev tbsp now down to streaky only and mostly in ams   No obvious other patterns in day to day or daytime variabilty or assoc chronic cough or cp or chest tightness, subjective wheeze overt sinus or hb symptoms. No unusual exp hx or h/o childhood pna/ asthma or knowledge of premature birth.  Sleeping ok without nocturnal  or early am exacerbation  of respiratory  c/o's or need for noct saba. Also denies any obvious fluctuation of symptoms with weather or environmental changes or other aggravating or alleviating factors except as outlined above   Current Medications, Allergies, Complete Past Medical History, Past Surgical History, Family History, and Social History were reviewed in [employer] record.            Review of Systems  Constitutional: Negative for fever, chills, activity change, appetite change and unexpected weight change.  HENT: Positive for congestion, sneezing and sore throat. Negative for dental problem, postnasal drip, rhinorrhea, trouble swallowing and voice change.   Eyes: Negative for visual disturbance.  Respiratory: Positive for cough. Negative for choking and shortness of breath.     Cardiovascular: Negative for chest pain and leg swelling.  Gastrointestinal: Negative for nausea, vomiting and abdominal pain.  Genitourinary: Negative for difficulty urinating.       Indigestion  Musculoskeletal: Negative for arthralgias.  Skin: Negative for rash.  Psychiatric/Behavioral: Negative for behavioral problems and confusion.       Objective:   Physical Exam  amb wm nad top dentures/ partials bottom  Wt Readings from Last 3 Encounters:  03/17/14 190 lb 6.4 oz (86.365 kg)  03/15/14 191 lb (86.637 kg)  03/11/14 189 lb (85.73 kg)      HEENT: nl dentition, turbinates, and orophanx. Nl external ear canals without cough reflex   NECK :  without JVD/Nodes/TM/ nl carotid upstrokes bilaterally   LUNGS: no acc muscle use, clear to A and P bilaterally without cough on insp or exp maneuvers   CV:  RRR  no s3 or murmur or increase in P2, no edema   ABD:  soft and nontender with nl excursion in the supine position. No bruits or organomegaly, bowel sounds nl  MS:  warm without deformities, calf tenderness, cyanosis or clubbing  SKIN: warm and dry without lesions    NEURO:  alert, approp, no deficits    CTa Chest 9/8/115 No evidence of pulmonary embolism.  Multifocal patchy opacity in the right upper and middle lobes,  suspicious for pneumonia or possibly pulmonary hemorrhage (given the  clinical history).  Underlying moderate emphysematous changes      Assessment &  Plan:

## 2014-03-17 NOTE — Patient Instructions (Signed)
Stop all aspirin until no blood for 3 days then ok to restart baby aspirin  The key is to stop smoking completely before smoking completely stops you!   Please schedule a follow up office visit in 3 weeks, sooner if needed with cxr

## 2014-03-18 NOTE — Telephone Encounter (Signed)
I spoke with the pt  He asks why he needs to come back in 3 wks  I advised that this is to do another cxr to compare to the last one to see if it is improving  Pt verbalized understanding  Nothing further needed

## 2014-03-19 DIAGNOSIS — F17210 Nicotine dependence, cigarettes, uncomplicated: Secondary | ICD-10-CM | POA: Insufficient documentation

## 2014-03-19 NOTE — Assessment & Plan Note (Signed)
>   3 m  I took an extended  opportunity with this patient to outline the consequences of continued cigarette use  in airway disorders based on all the data we have from the multiple national lung health studies (perfomed over decades at millions of dollars in cost)  indicating that smoking cessation, not choice of inhalers or physicians, is the most important aspect of care.   

## 2014-03-19 NOTE — Assessment & Plan Note (Signed)
Symptoms were atypical for CAP with more blood than purulent sputum or any antecedent hx to suggest an infection but he is clearly getting better on abx with nothing focal on cxr and this developed on asa  Therefore try off asa and f/u in 3 weeks to consider fob if any persistent abn on cxr on ongoing bleeding  In meantime needs to make every effort to stop smoking

## 2014-03-26 ENCOUNTER — Ambulatory Visit (HOSPITAL_COMMUNITY): Payer: Self-pay | Admitting: Psychiatry

## 2014-03-28 ENCOUNTER — Ambulatory Visit (INDEPENDENT_AMBULATORY_CARE_PROVIDER_SITE_OTHER)
Admission: RE | Admit: 2014-03-28 | Discharge: 2014-03-28 | Disposition: A | Discharge status: Home / Self Care | Payer: BC Managed Care – PPO | Source: Ambulatory Visit | Attending: Internal Medicine | Admitting: Internal Medicine

## 2014-03-28 ENCOUNTER — Ambulatory Visit (INDEPENDENT_AMBULATORY_CARE_PROVIDER_SITE_OTHER): Payer: BC Managed Care – PPO | Admitting: Internal Medicine

## 2014-03-28 ENCOUNTER — Encounter: Payer: Self-pay | Admitting: Internal Medicine

## 2014-03-28 ENCOUNTER — Encounter: Payer: Self-pay | Admitting: *Deleted

## 2014-03-28 VITALS — BP 120/74 | HR 60 | Temp 97.5°F | Ht 71.75 in | Wt 192.0 lb

## 2014-03-28 DIAGNOSIS — F17210 Nicotine dependence, cigarettes, uncomplicated: Secondary | ICD-10-CM

## 2014-03-28 DIAGNOSIS — J189 Pneumonia, unspecified organism: Secondary | ICD-10-CM

## 2014-03-28 DIAGNOSIS — F17200 Nicotine dependence, unspecified, uncomplicated: Secondary | ICD-10-CM

## 2014-03-28 NOTE — Progress Notes (Signed)
Subjective:    Patient ID: Roger [name], male    DOB: 06/14/1956   MRN: [id number]    Brief patient profile:  38  yowm active smoker referred to pulmonary clinic 03/17/2014 for new onset hemoptysis by Dr Roger [name].    History of Present Illness  03/17/2014 1st [name] office visit/ Roger [name]   Chief Complaint  Patient presents with  . Pulmonary Consult    Referred by Dr. Lujean [name]. Pt states that he was dxed with PNA about 1 wk ago.  He states that he started to have hemoptysis and went to ED and had CT Chest.  He is currently being txed with Levaquin, but still having minimal hemoptysis daily.   acute sneeze/ cough walking into locker room > immediately to ER dx with CAP and rx with levaquin x 7d.   No epistaxis, min chills, no fever or cp, some sore throat but resolved. Max bleeding was sev tbsp now down to streaky only and mostly in ams rec Stop all aspirin until no blood for 3 days then ok to restart baby aspirin The key is to stop smoking completely before smoking completely stops you!     03/28/2014 f/u ov/Roger [name] re: f/u pna with hemoptysis Chief Complaint  Patient presents with  . Followup with CXR    Cough is improving, but not resolved. Still has occ hemoptysis.   min blood, very dark specks even back on asa 81 mg daily (never stopped as rec) . No cp or sob  No obvious day to day or daytime variabilty or assoc  chest tightness, subjective wheeze overt sinus or hb symptoms. No unusual exp hx or h/o childhood pna/ asthma or knowledge of premature birth.  Sleeping ok without nocturnal  or early am exacerbation  of respiratory  c/o's or need for noct saba. Also denies any obvious fluctuation of symptoms with weather or environmental changes or other aggravating or alleviating factors except as outlined above   Current Medications, Allergies, Complete Past Medical History, Past Surgical History, Family History, and Social History were reviewed in [employer] record.  ROS  The following are not active complaints unless bolded sore throat, dysphagia, dental problems, itching, sneezing,  nasal congestion or excess/ purulent secretions, ear ache,   fever, chills, sweats, unintended wt loss, pleuritic or exertional cp, hemoptysis,  orthopnea pnd or leg swelling, presyncope, palpitations, heartburn, abdominal pain, anorexia, nausea, vomiting, diarrhea  or change in bowel or urinary habits, change in stools or urine, dysuria,hematuria,  rash, arthralgias, visual complaints, headache, numbness weakness or ataxia or problems with walking or coordination,  change in mood/affect or memory.                         Objective:   Physical Exam  amb wm nad top dentures/ partials bottom  03/28/2014         192  Wt Readings from Last 3 Encounters:  03/17/14 190 lb 6.4 oz (86.365 kg)  03/15/14 191 lb (86.637 kg)  03/11/14 189 lb (85.73 kg)      HEENT: nl dentition, turbinates, and orophanx. Nl external ear canals without cough reflex   NECK :  without JVD/Nodes/TM/ nl carotid upstrokes bilaterally   LUNGS: no acc muscle use, clear to A and P bilaterally without cough on insp or exp maneuvers   CV:  RRR  no s3 or murmur or increase in P2, no edema   ABD:  soft and  nontender with nl excursion in the supine position. No bruits or organomegaly, bowel sounds nl  MS:  warm without deformities, calf tenderness, cyanosis or clubbing  SKIN: warm and dry without lesions    NEURO:  alert, approp, no deficits    CTa Chest 9/8/115 No evidence of pulmonary embolism.  Multifocal patchy opacity in the right upper and middle lobes,  suspicious for pneumonia or possibly pulmonary hemorrhage (given the  clinical history).  Underlying moderate emphysematous changes   CXR  03/28/2014 : Interim near complete clearing of right upper lobe infiltrate.         Assessment & Plan:

## 2014-03-28 NOTE — Patient Instructions (Signed)
The key is to stop smoking completely before smoking completely stops you!   If the bleeding persists, call me to schedule bronchoscopy  What you will need :   Come to outpatient registration at [facility] (behind the ER) at 62 am   with nothing to eat or drink after midnight the night before for Bronchoscopy  - you will need someone to drive you home.

## 2014-03-28 NOTE — Progress Notes (Signed)
Quick Note:  D/w pt at ov ______

## 2014-03-29 NOTE — Assessment & Plan Note (Signed)
See CT chest 03/11/14 > rx x 7 d levaquin 750 > cxr cleared 03/28/14   Not really clear this was pna but no mass on CT and cxr/ hemoptysis have cleared so fob optional of bleeding recurs.  Discussed in detail all the  indications, usual  risks and alternatives  relative to the benefits with patient who agrees to proceed with bronchoscopy  If bleeding recurs on or off asa 81 mg daily in absence of epistaxis

## 2014-03-29 NOTE — Assessment & Plan Note (Signed)
He is concerned about lung ca but declined fob and also did not commit to smoking cessation so little else to offer at this point

## 2014-04-03 ENCOUNTER — Encounter (HOSPITAL_COMMUNITY): Payer: Self-pay | Admitting: Psychiatry

## 2014-04-03 ENCOUNTER — Ambulatory Visit (INDEPENDENT_AMBULATORY_CARE_PROVIDER_SITE_OTHER): Payer: BC Managed Care – PPO | Admitting: Psychiatry

## 2014-04-03 DIAGNOSIS — F411 Generalized anxiety disorder: Secondary | ICD-10-CM

## 2014-04-03 MED ORDER — ALPRAZOLAM 1 MG PO TABS: 1.0000 mg | ORAL_TABLET | Freq: Four times a day (QID) | ORAL | Status: DC

## 2014-04-03 MED ORDER — MIRTAZAPINE 15 MG PO TABS: 15.0000 mg | ORAL_TABLET | Freq: Every day | ORAL | Status: DC

## 2014-04-03 NOTE — Progress Notes (Signed)
[facility] [phone] Progress Note  RAAHIL [name] [id number] 58 y.o.  04/03/2014 11:50 AM  Chief Complaint: Medication management and followup.  History of Present Illness: Roger [name] came for his followup appointment.  He is taking Xanax 1 mg 4 times a day and half tablet as needed.  He lost his prescription but he was visiting out of town and he called [place] and he has given 15 days supply of prescription.  Roger [name] feels embarrassed and apologized that he lost the prescription.  He was sick weeks ago and finish antibiotic for upper respiratory infection.  Recently he seen pulmonologist and he is relieved that his blood work is okay.  He is still having anxiety and nervousness and he is very concerned about his physical health.  He denies any major panic attack but he is still having visit of anxiety .  He is sleeping okay.  His appetite is okay.  He is taking Remeron at bedtime.  The Roger [name] bounced to keep his current medication because it is helping his anxiety attacks.  He denies any agitation, anger or any mood swings.  His appetite is okay.  He does not drink or use any illegal substances.  He continues to enjoy basketball game and he is a big fan of [employer].  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  Suicidal Ideation: No Plan Formed: No Roger [name] has means to carry out plan: No   Homicidal Ideation: No Plan Formed: No Roger [name] has means to carry out plan: No  Review of Systems: Psychiatric: Agitation: No Hallucination: No Depressed Mood: No Insomnia: No Hypersomnia: No Altered Concentration: No Feels Worthless: No Grandiose Ideas: No Belief In Special Powers: No New/Increased Substance Abuse: No Compulsions: No  Neurologic: Headache: No Seizure: No Paresthesias: No  Past Medical Family, Social History:  Roger [name] has hypertension and he see Dr. Laverle Patter at [facility]   He have blood work on 10/05/2012. His CBC is normal,  comprehensive metabolic panel is normal  (BUN 7 creatinine 0.8), ALT AST and alkaline phosphatase is normal, PCA 0.27 normal and TSH 1.33 is also normal.  Lives with his sister.  He has no children.  He is working as a Sports coach in a school.  Outpatient Encounter Prescriptions as of 04/03/2014  Medication Sig  . ALPRAZolam (XANAX) 1 MG tablet Take 1 tablet (1 mg total) by mouth 4 (four) times daily. May take 1/2 tablet extra per day if needed  . aspirin EC 325 MG tablet Take 325 mg by mouth daily.  Marland Kitchen atenolol (TENORMIN) 50 MG tablet Take 50 mg by mouth every morning.   Marland Kitchen ibuprofen (ADVIL,MOTRIN) 200 MG tablet Take 200 mg by mouth every 6 (six) hours as needed for moderate pain (pain).   . mirtazapine (REMERON) 15 MG tablet Take 1 tablet (15 mg total) by mouth at bedtime.  . [DISCONTINUED] ALPRAZolam (XANAX) 1 MG tablet Take 1 tablet (1 mg total) by mouth 4 (four) times daily. May take 1/2 tablet extra per day if needed  . [DISCONTINUED] mirtazapine (REMERON) 15 MG tablet Take 1 tablet (15 mg total) by mouth at bedtime.    Past Psychiatric History/Hospitalization(s): Roger [name] denies any inpatient psychiatric treatment.  He done an intensive outpatient program in 2013.  At that time he has multiple losses in his life including his business and home.  He has taken amitriptyline in the past. Anxiety: Yes Bipolar Disorder: No Depression: Yes Mania: No Psychosis:  No Schizophrenia: No Personality Disorder: No Hospitalization for psychiatric illness: No History of Electroconvulsive Shock Therapy: No Prior Suicide Attempts: No  Physical Exam: Constitutional:  There were no vitals taken for this visit.  General Appearance: well nourished  Musculoskeletal: Strength & Muscle Tone: within normal limits Gait & Station: normal Roger [name] leans: N/A  Psychiatric: Speech (describe rate, volume, coherence, spontaneity, and abnormalities if any): Clear and coherent.  Normal tone and volume.  Thought  Process (describe rate, content, abstract reasoning, and computation): Logical and goal directed.  Associations: Intact  Thoughts: normal  Mental Status: Orientation: oriented to person, place, time/date, situation, day of week, month of year and year Mood & Affect: anxiety Attention Span & Concentration: Fair  Established Problem, Stable/Improving (1), Review of Psycho-Social Stressors (1), Review of Last Therapy Session (1) and Review of Medication Regimen & Side Effects (2)  Assessment: Axis I: Generalized anxiety disorder  Axis II: Deferred  Axis III: See medical history  Axis IV: Mild  Axis V: 75-80   Plan:  Roger [name] is doing better on Xanax and Remeron .  He denies any major panic attack or an anxiety attack since taking the Xanax as prescribed.  Explained that he needed to keep his prescription lock and in the future if he lost his prescription and he needed to report to the police.  Discusses benzodiazepine dependency, withdrawal and abuse.  I will continue Xanax 1 mg 4 times a day and half tablet if needed along with Remeron 15 mg at bedtime.  Recommended to call us back if he has any question or any concern.  I will provide 140 tablets of Xanax 1 mg.  Followup in 3 months.   Anjolina Byrer T., MD 04/03/2014

## 2014-04-07 ENCOUNTER — Ambulatory Visit: Payer: Self-pay | Admitting: Internal Medicine

## 2014-05-21 IMAGING — CT CT CHEST W/O CM
2 of 4 series · 15 of 36 positions shown, 18 images · non-contrast
Comparison: Chest x-ray dated 08/19/2013

CLINICAL DATA: Hemoptysis.  Bronchitis.

EXAM:
CT CHEST WITHOUT CONTRAST
TECHNIQUE: Multidetector CT imaging of the chest was performed following the
standard protocol without IV contrast.

[Series 2: chest w/o · axial · non-contrast · 0.76mm/px · z∈[-290,-14]mm · 12 of 65 slices shown, 15 images]
[im 5/65  mediastinal]
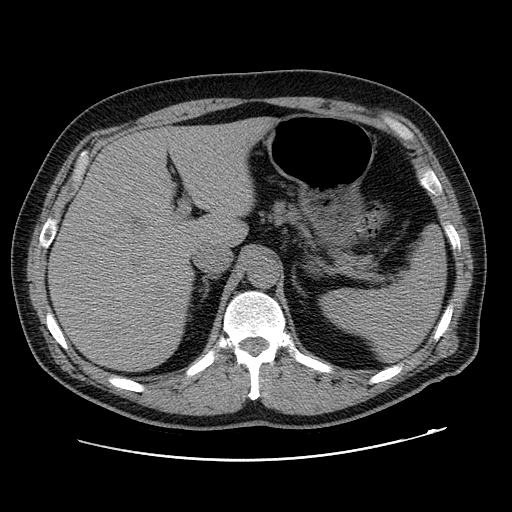
[im 5/65  lung]
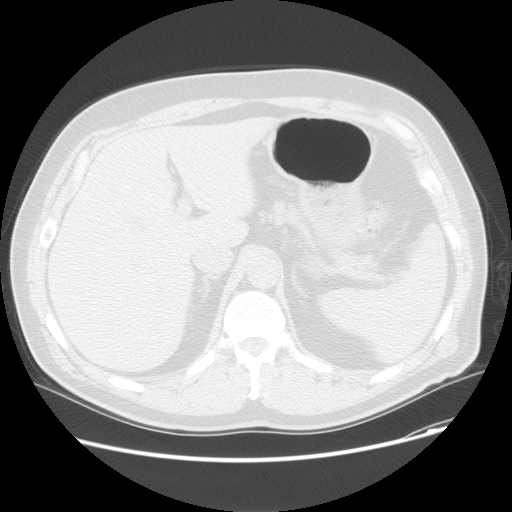
[im 10/65  lung]
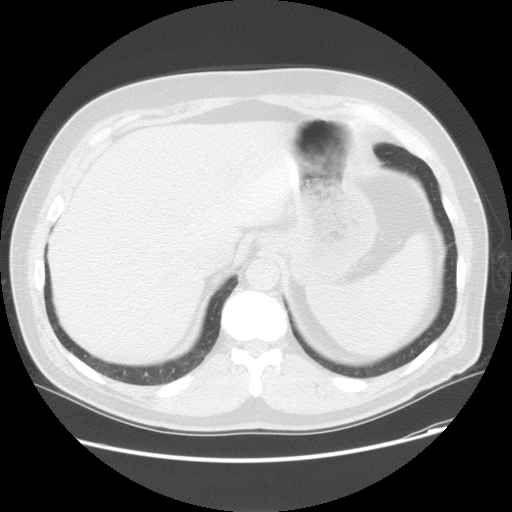
[im 15/65  lung]
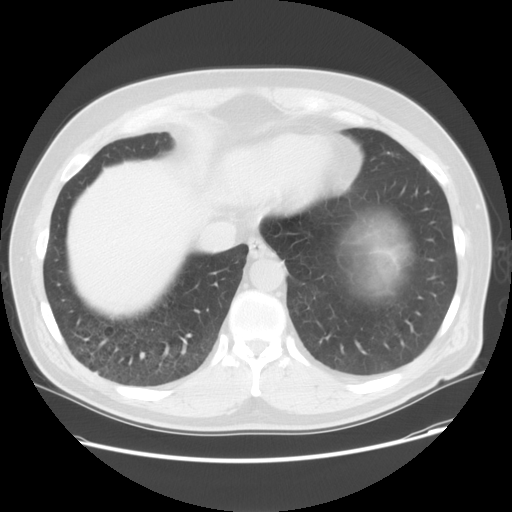
[im 20/65  lung]
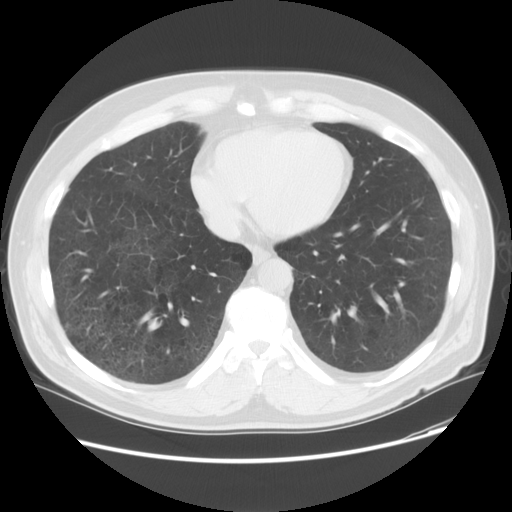
[im 25/65  mediastinal]
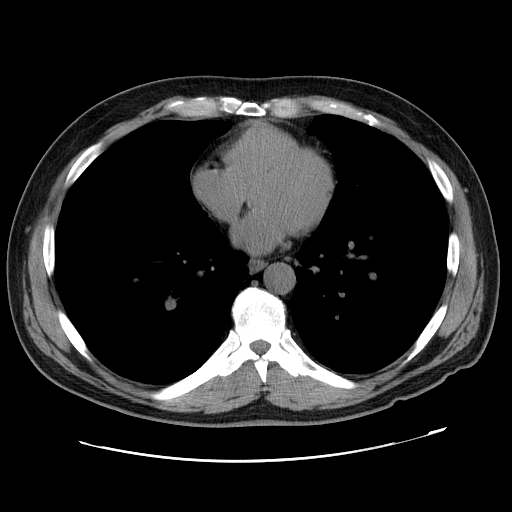
[im 25/65  lung]
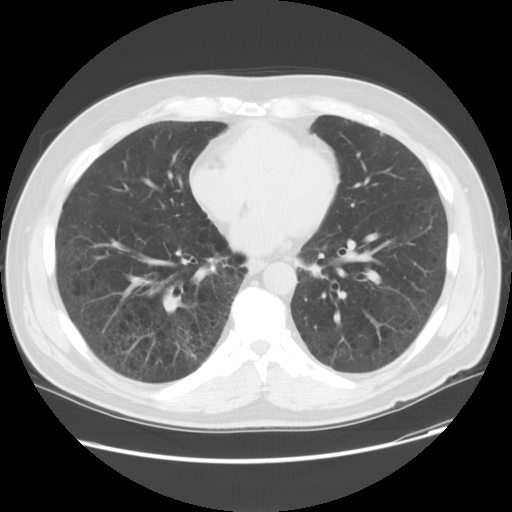
[im 30/65  lung]
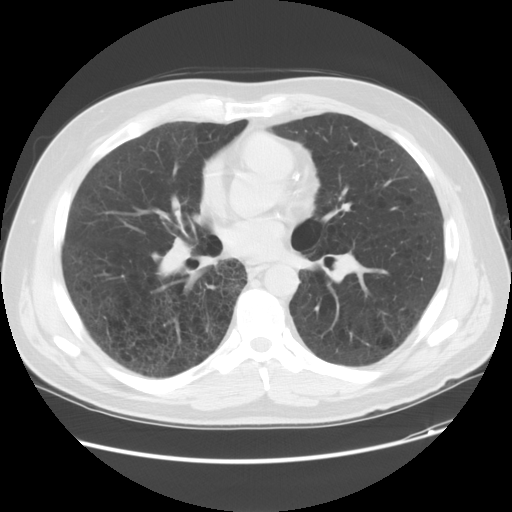
[im 35/65  lung]
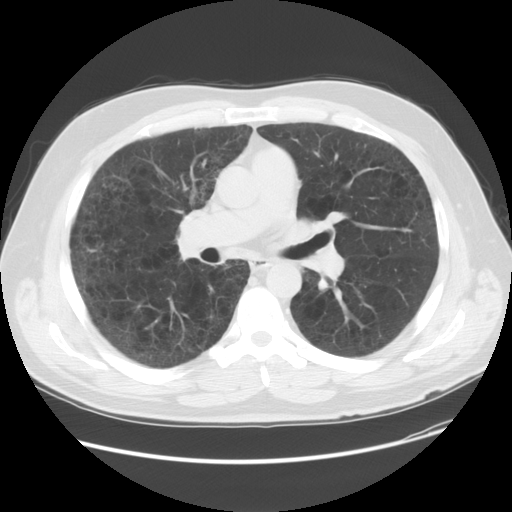
[im 40/65  lung]
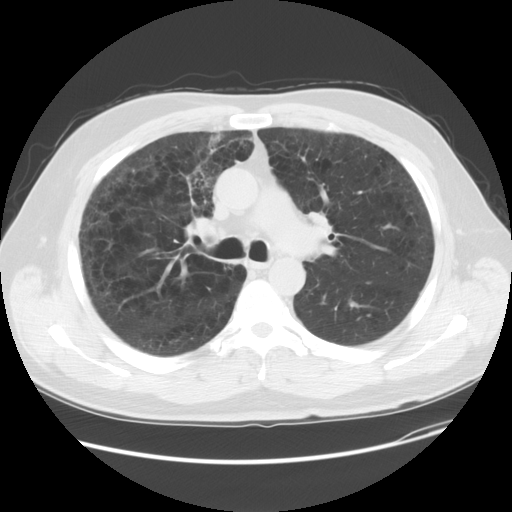
[im 45/65  mediastinal]
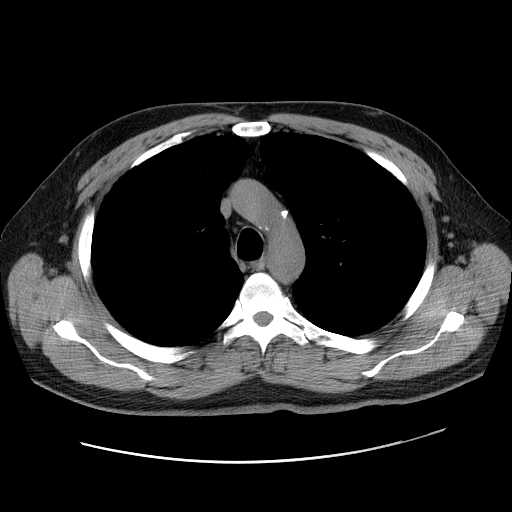
[im 45/65  lung]
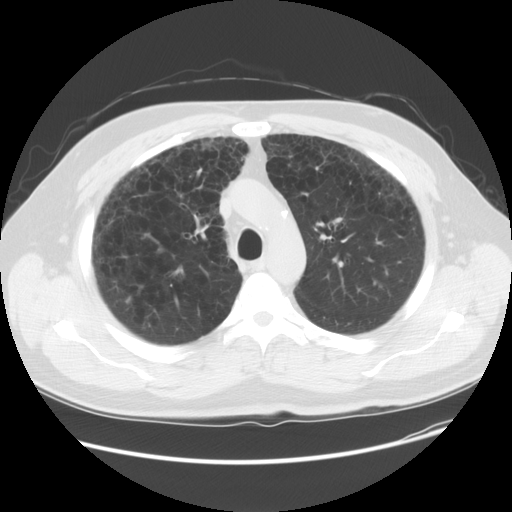
[im 50/65  lung]
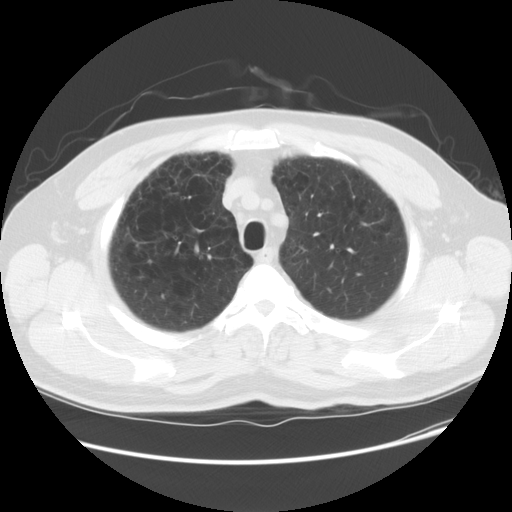
[im 55/65  lung]
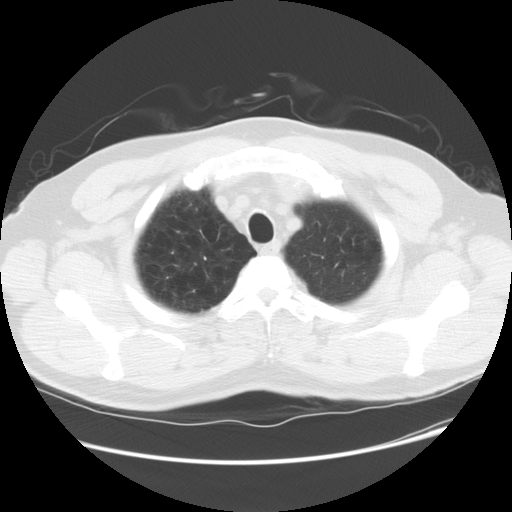
[im 60/65  lung]
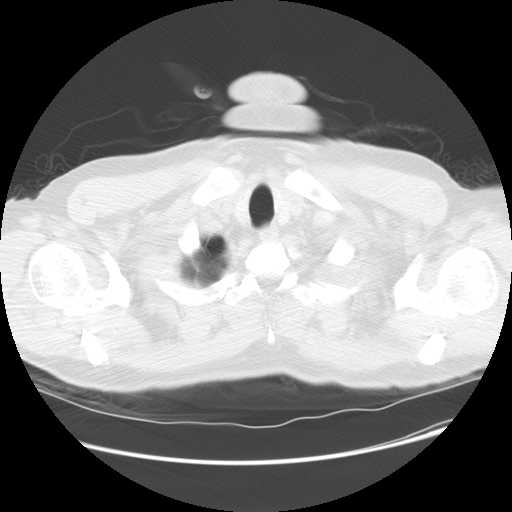

[Series 401: cor · coronal · 0.76mm/px · 3 of 111 slices shown]
[im 23/111  lung]
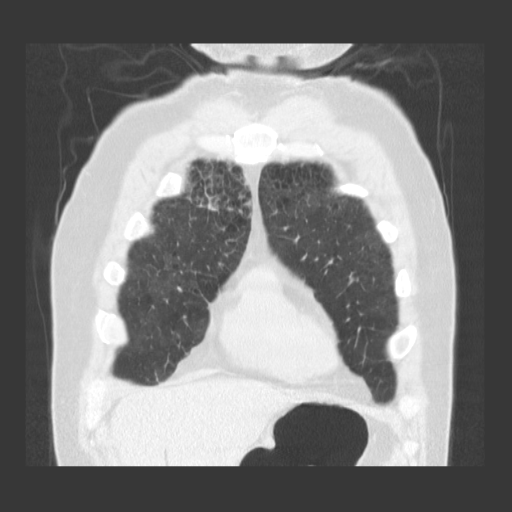
[im 45/111  lung]
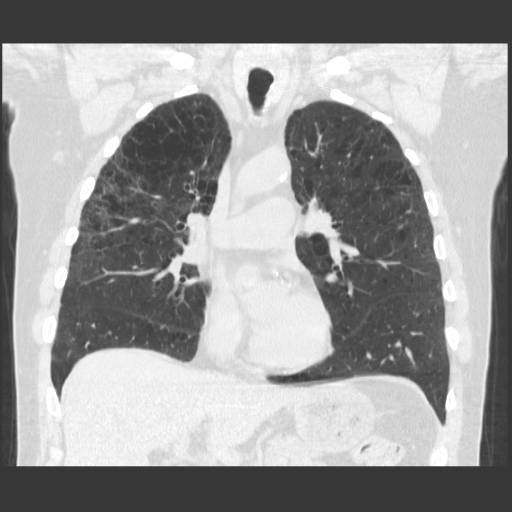
[im 67/111  lung]
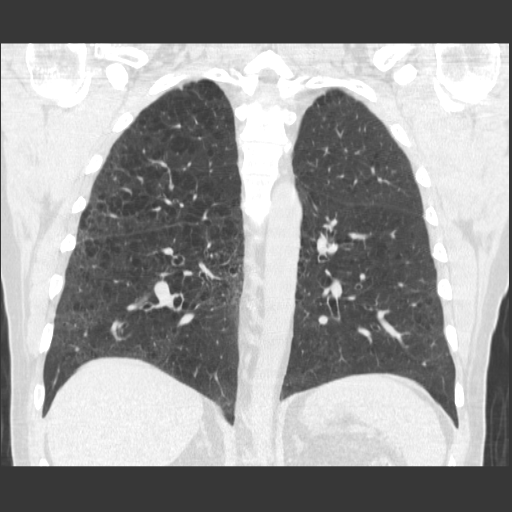

[15 of 36 positions shown; findings below may reference images not displayed]

FINDINGS: The patient has severe chronic obstructive lung disease throughout
both lungs, most severe in the upper lobes. Interstitial markings
are also diffusely accentuated. No consolidative infiltrates or
effusions. Heart size is normal. No adenopathy. Slight coronary
artery calcification. No osseous abnormality. Visualized portion of
the upper abdomen is normal.
IMPRESSION: Severe chronic interstitial and obstructive lung disease. No acute
abnormalities.

## 2014-06-03 ENCOUNTER — Ambulatory Visit (HOSPITAL_COMMUNITY): Payer: Self-pay | Admitting: Psychiatry

## 2014-06-11 ENCOUNTER — Ambulatory Visit (HOSPITAL_COMMUNITY): Payer: Self-pay | Admitting: Psychiatry

## 2014-06-16 ENCOUNTER — Encounter (HOSPITAL_COMMUNITY): Payer: Self-pay | Admitting: Psychiatry

## 2014-06-16 ENCOUNTER — Ambulatory Visit (INDEPENDENT_AMBULATORY_CARE_PROVIDER_SITE_OTHER): Payer: BC Managed Care – PPO | Admitting: Psychiatry

## 2014-06-16 VITALS — BP 132/77 | HR 66 | Ht 71.0 in | Wt 185.8 lb

## 2014-06-16 DIAGNOSIS — F411 Generalized anxiety disorder: Secondary | ICD-10-CM

## 2014-06-16 MED ORDER — ALPRAZOLAM 1 MG PO TABS: 1.0000 mg | ORAL_TABLET | Freq: Four times a day (QID) | ORAL | Status: DC

## 2014-06-16 MED ORDER — MIRTAZAPINE 15 MG PO TABS: 15.0000 mg | ORAL_TABLET | Freq: Every day | ORAL | Status: DC

## 2014-06-16 NOTE — Progress Notes (Signed)
[facility] [phone] Progress Note  Roger [name] [id number] 58 y.o.  06/16/2014 10:05 AM  Chief Complaint: Medication management and followup.  History of Present Illness: Roger [name] came for his followup appointment.  He is very emotional and excited because he find out that he has a daughter which he has never met before and now she is 57 years old.  He find out that he has 25 year old granddaughter and now he is actively involved with her granddaughter.  He is coaching the basketball .  Patient told that this year Christmas is really bringing a lot of happiness to him.  However he gets very emotional sometimes.  He started to keep himself busy.  He lost some weight from the past.  He denies any agitation, anger, mood swing.  He sleeping good.  He has no major panic attack but admitted for past few weeks he was very anxious and nervous and emotional.  He is taking Xanax as prescribed and he does not want to lower down the dosage at this time but promised to do it on his next visit.  He is taking Remeron at bedtime.  Patient does not drink or use any illegal substances.  He enjoys basketball game and he is a big fan of [employer].  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  He lives with his sister.  His appetite is okay.  His vitals are stable.  Suicidal Ideation: No Plan Formed: No Patient has means to carry out plan: No   Homicidal Ideation: No Plan Formed: No Patient has means to carry out plan: No  Review of Systems: Psychiatric: Agitation: No Hallucination: No Depressed Mood: No Insomnia: No Hypersomnia: No Altered Concentration: No Feels Worthless: No Grandiose Ideas: No Belief In Special Powers: No New/Increased Substance Abuse: No Compulsions: No  Neurologic: Headache: No Seizure: No Paresthesias: No  Past Medical Family, Social History:  Patient has hypertension and he see Dr. Laverle Patter at [place] [facility].     Outpatient  Encounter Prescriptions as of 06/16/2014  Medication Sig  . ALPRAZolam (XANAX) 1 MG tablet Take 1 tablet (1 mg total) by mouth 4 (four) times daily. May take 1/2 tablet extra per day if needed  . aspirin EC 325 MG tablet Take 325 mg by mouth daily.  Marland Kitchen atenolol (TENORMIN) 50 MG tablet Take 50 mg by mouth every morning.   Marland Kitchen ibuprofen (ADVIL,MOTRIN) 200 MG tablet Take 200 mg by mouth every 6 (six) hours as needed for moderate pain (pain).   . mirtazapine (REMERON) 15 MG tablet Take 1 tablet (15 mg total) by mouth at bedtime.  . [DISCONTINUED] ALPRAZolam (XANAX) 1 MG tablet Take 1 tablet (1 mg total) by mouth 4 (four) times daily. May take 1/2 tablet extra per day if needed  . [DISCONTINUED] mirtazapine (REMERON) 15 MG tablet Take 1 tablet (15 mg total) by mouth at bedtime.    Past Psychiatric History/Hospitalization(s): Patient denies any inpatient psychiatric treatment.  He done an intensive outpatient program in 2013.  At that time he has multiple losses in his life including his business and home.  He has taken amitriptyline in the past. Anxiety: Yes Bipolar Disorder: No Depression: Yes Mania: No Psychosis: No Schizophrenia: No Personality Disorder: No Hospitalization for psychiatric illness: No History of Electroconvulsive Shock Therapy: No Prior Suicide Attempts: No  Physical Exam: Constitutional:  BP 132/77 mmHg  Pulse 66  Ht _0  (1.803 m)  Wt 185  lb 12.8 oz (84.278 kg)  BMI 25.93 kg/m2  General Appearance: well nourished  Musculoskeletal: Strength & Muscle Tone: within normal limits Gait & Station: normal Patient leans: N/A  Psychiatric: Speech (describe rate, volume, coherence, spontaneity, and abnormalities if any): Clear and coherent.  Normal tone and volume.  Thought Process (describe rate, content, abstract reasoning, and computation): Logical and goal directed.  Associations: Intact  Thoughts: normal  Mental Status: Orientation: oriented to person, place,  time/date, situation, day of week, month of year and year Mood & Affect: anxiety Attention Span & Concentration: Fair  Established Problem, Stable/Improving (1), Review of Psycho-Social Stressors (1), Review of Last Therapy Session (1) and Review of Medication Regimen & Side Effects (2)  Assessment: Axis I: Generalized anxiety disorder  Axis II: Deferred  Axis III: See medical history  Axis IV: Mild  Axis V: 75-80   Plan:  Patient is doing better on Xanax and Remeron .  He does not ask for early refills.  He keep his Xanax prescription lock .  Discussed benzodiazepine dependency, tolerance and withdrawal symptoms.  At this time we will not to lower his Xanax however he promised to cut down in the future and we will consider decreasing Xanax on his next visit.  Continue Remeron 15 mg at bedtime.  Recommended to call us back if he has any question or any concern.  I will see him again in 3 months.  We'll provide 140 tablets of Xanax 1 mg.    ARFEEN,SYED T., MD 06/16/2014

## 2014-06-23 ENCOUNTER — Ambulatory Visit (HOSPITAL_COMMUNITY): Payer: Self-pay | Admitting: Psychiatry

## 2014-07-08 ENCOUNTER — Ambulatory Visit (HOSPITAL_COMMUNITY): Payer: Self-pay | Admitting: Psychiatry

## 2014-07-08 ENCOUNTER — Emergency Department (HOSPITAL_COMMUNITY)
Admission: EM | Admit: 2014-07-08 | Discharge: 2014-07-08 | Disposition: A | Discharge status: Home / Self Care | Payer: BC Managed Care – PPO | Attending: Emergency Medicine | Admitting: Emergency Medicine

## 2014-07-08 ENCOUNTER — Encounter (HOSPITAL_COMMUNITY): Payer: Self-pay

## 2014-07-08 DIAGNOSIS — Z720 Tobacco use: Secondary | ICD-10-CM | POA: Diagnosis not present

## 2014-07-08 DIAGNOSIS — I10 Essential (primary) hypertension: Secondary | ICD-10-CM | POA: Insufficient documentation

## 2014-07-08 DIAGNOSIS — J449 Chronic obstructive pulmonary disease, unspecified: Secondary | ICD-10-CM | POA: Diagnosis not present

## 2014-07-08 DIAGNOSIS — Z79899 Other long term (current) drug therapy: Secondary | ICD-10-CM | POA: Diagnosis not present

## 2014-07-08 DIAGNOSIS — J019 Acute sinusitis, unspecified: Secondary | ICD-10-CM | POA: Diagnosis not present

## 2014-07-08 DIAGNOSIS — Z7982 Long term (current) use of aspirin: Secondary | ICD-10-CM | POA: Diagnosis not present

## 2014-07-08 DIAGNOSIS — F419 Anxiety disorder, unspecified: Secondary | ICD-10-CM | POA: Diagnosis not present

## 2014-07-08 DIAGNOSIS — R040 Epistaxis: Secondary | ICD-10-CM | POA: Insufficient documentation

## 2014-07-08 DIAGNOSIS — R531 Weakness: Secondary | ICD-10-CM

## 2014-07-08 DIAGNOSIS — J329 Chronic sinusitis, unspecified: Secondary | ICD-10-CM

## 2014-07-08 DIAGNOSIS — F329 Major depressive disorder, single episode, unspecified: Secondary | ICD-10-CM | POA: Insufficient documentation

## 2014-07-08 MED ORDER — FLUTICASONE PROPIONATE 50 MCG/ACT NA SUSP: 2.0000 | Freq: Every day | NASAL | Status: DC

## 2014-07-08 MED ORDER — CETIRIZINE HCL 10 MG PO CAPS: 10.0000 mg | ORAL_CAPSULE | Freq: Every day | ORAL | Status: DC

## 2014-07-08 NOTE — Discharge Instructions (Signed)
Zyrtec daily for congestion. flonase daily. Saline every 2 hrs. Follow up with your doctor if not improving.     Nosebleed Nosebleeds can be caused by many conditions, including trauma, infections, polyps, foreign bodies, dry mucous membranes or climate, medicines, and air conditioning. Most nosebleeds occur in the front of the nose. Because of this location, most nosebleeds can be controlled by pinching the nostrils gently and continuously for at least 10 to 20 minutes. The long, continuous pressure allows enough time for the blood to clot. If pressure is released during that 10 to 20 minute time period, the process may have to be started again. The nosebleed may stop by itself or quit with pressure, or it may need concentrated heating (cautery) or pressure from packing. HOME CARE INSTRUCTIONS   If your nose was packed, try to maintain the pack inside until your health care provider removes it. If a gauze pack was used and it starts to fall out, gently replace it or cut the end off. Do not cut if a balloon catheter was used to pack the nose. Otherwise, do not remove unless instructed.  Avoid blowing your nose for 12 hours after treatment. This could dislodge the pack or clot and start the bleeding again.  If the bleeding starts again, sit up and bend forward, gently pinching the front half of your nose continuously for 20 minutes.  If bleeding was caused by dry mucous membranes, use over-the-counter saline nasal spray or gel. This will keep the mucous membranes moist and allow them to heal. If you must use a lubricant, choose the water-soluble variety. Use it only sparingly and not within several hours of lying down.  Do not use petroleum jelly or mineral oil, as these may drip into the lungs and cause serious problems.  Maintain humidity in your home by using less air conditioning or by using a humidifier.  Do not use aspirin or medicines which make bleeding more likely. Your health care  provider can give you recommendations on this.  Resume normal activities as you are able, but try to avoid straining, lifting, or bending at the waist for several days.  If the nosebleeds become recurrent and the cause is unknown, your health care provider may suggest laboratory tests. SEEK MEDICAL CARE IF: You have a fever. SEEK IMMEDIATE MEDICAL CARE IF:   Bleeding recurs and cannot be controlled.  There is unusual bleeding from or bruising on other parts of the body.  Nosebleeds continue.  There is any worsening of the condition which originally brought you in.  You become light-headed, feel faint, become sweaty, or vomit blood. MAKE SURE YOU:   Understand these instructions.  Will watch your condition.  Will get help right away if you are not doing well or get worse. Document Released: 03/30/2005 Document Revised: 11/04/2013 Document Reviewed: 05/21/2009 [facility] Patient Information 2015 [place], [place]. This information is not intended to replace advice given to you by your health care provider. Make sure you discuss any questions you have with your health care provider.  Sinusitis Sinusitis is redness, soreness, and inflammation of the paranasal sinuses. Paranasal sinuses are air pockets within the bones of your face (beneath the eyes, the middle of the forehead, or above the eyes). In healthy paranasal sinuses, mucus is able to drain out, and air is able to circulate through them by way of your nose. However, when your paranasal sinuses are inflamed, mucus and air can become trapped. This can allow bacteria and other germs to  grow and cause infection. Sinusitis can develop quickly and last only a short time (acute) or continue over a long period (chronic). Sinusitis that lasts for more than 12 weeks is considered chronic.  CAUSES  Causes of sinusitis include:  Allergies.  Structural abnormalities, such as displacement of the cartilage that separates your nostrils (deviated  septum), which can decrease the air flow through your nose and sinuses and affect sinus drainage.  Functional abnormalities, such as when the small hairs (cilia) that line your sinuses and help remove mucus do not work properly or are not present. SIGNS AND SYMPTOMS  Symptoms of acute and chronic sinusitis are the same. The primary symptoms are pain and pressure around the affected sinuses. Other symptoms include:  Upper toothache.  Earache.  Headache.  Bad breath.  Decreased sense of smell and taste.  A cough, which worsens when you are lying flat.  Fatigue.  Fever.  Thick drainage from your nose, which often is green and may contain pus (purulent).  Swelling and warmth over the affected sinuses. DIAGNOSIS  Your health care provider will perform a physical exam. During the exam, your health care provider may:  Look in your nose for signs of abnormal growths in your nostrils (nasal polyps).  Tap over the affected sinus to check for signs of infection.  View the inside of your sinuses (endoscopy) using an imaging device that has a light attached (endoscope). If your health care provider suspects that you have chronic sinusitis, one or more of the following tests may be recommended:  Allergy tests.  Nasal culture. A sample of mucus is taken from your nose, sent to a lab, and screened for bacteria.  Nasal cytology. A sample of mucus is taken from your nose and examined by your health care provider to determine if your sinusitis is related to an allergy. TREATMENT  Most cases of acute sinusitis are related to a viral infection and will resolve on their own within 10 days. Sometimes medicines are prescribed to help relieve symptoms (pain medicine, decongestants, nasal steroid sprays, or saline sprays).  However, for sinusitis related to a bacterial infection, your health care provider will prescribe antibiotic medicines. These are medicines that will help kill the bacteria  causing the infection.  Rarely, sinusitis is caused by a fungal infection. In theses cases, your health care provider will prescribe antifungal medicine. For some cases of chronic sinusitis, surgery is needed. Generally, these are cases in which sinusitis recurs more than 3 times per year, despite other treatments. HOME CARE INSTRUCTIONS   Drink plenty of water. Water helps thin the mucus so your sinuses can drain more easily.  Use a humidifier.  Inhale steam 3 to 4 times a day (for example, sit in the bathroom with the shower running).  Apply a warm, moist washcloth to your face 3 to 4 times a day, or as directed by your health care provider.  Use saline nasal sprays to help moisten and clean your sinuses.  Take medicines only as directed by your health care provider.  If you were prescribed either an antibiotic or antifungal medicine, finish it all even if you start to feel better. SEEK IMMEDIATE MEDICAL CARE IF:  You have increasing pain or severe headaches.  You have nausea, vomiting, or drowsiness.  You have swelling around your face.  You have vision problems.  You have a stiff neck.  You have difficulty breathing. MAKE SURE YOU:   Understand these instructions.  Will watch your condition.  Will get help right away if you are not doing well or get worse. Document Released: 06/20/2005 Document Revised: 11/04/2013 Document Reviewed: 07/05/2011 [facility] Patient Information 2015 [place], [place]. This information is not intended to replace advice given to you by your health care provider. Make sure you discuss any questions you have with your health care provider.

## 2014-07-08 NOTE — ED Notes (Addendum)
Pt here for nose bleed that started spontaneously earlier. Hx of HTN. No blood thinners. Has had some sinus congestion and runny nose the past several days.

## 2014-07-08 NOTE — ED Provider Notes (Signed)
CSN: [id number]     Arrival date & time 07/08/14  60 History   First MD Initiated Contact with Patient 07/08/14 1959     Chief Complaint  Patient presents with  . Epistaxis     (Consider location/radiation/quality/duration/timing/severity/associated sxs/prior Treatment) HPI Roger [name] is a 59 y.o. male with history of hypertension, presents to emergency department complaining of a nosebleed. States nosebleed started earlier today. It subsided on its own. Patient states that he has also had some nasal congestion, mild cough, sore throat, fullness sensation in his ears. He states symptoms have been going on for several days. He has not tried any medications for this. He states he has been blowing his nose a lot. He states that after a while, he became concerned and wanted to get checked out to make sure "I did not have an aneurysm or something."  Past Medical History  Diagnosis Date  . Anxiety   . Hypertension   . Depression   . Anxiety   . COPD (chronic obstructive pulmonary disease)   . Asthma    Past Surgical History  Procedure Laterality Date  . Tonsillectomy     Family History  Problem Relation Age of Onset  . Anxiety disorder Mother   . Depression Mother   . Anxiety disorder Sister   . Depression Sister   . Anxiety disorder Brother   . Depression Brother   . Lung disease Mother     "arthritic lung"   History  Substance Use Topics  . Smoking status: Current Every Day Smoker -- 0.25 packs/day for 25 years    Types: Cigarettes  . Smokeless tobacco: Never Used  . Alcohol Use: No    Review of Systems  Constitutional: Negative for fever and chills.  HENT: Positive for congestion, nosebleeds, rhinorrhea, sinus pressure, sneezing and sore throat. Negative for ear discharge, ear pain, facial swelling and hearing loss.   Respiratory: Negative for cough, chest tightness and shortness of breath.   Cardiovascular: Negative for chest pain, palpitations and leg swelling.   Gastrointestinal: Negative for nausea, vomiting, abdominal pain, diarrhea and abdominal distention.  Genitourinary: Negative for dysuria, urgency, frequency and hematuria.  Musculoskeletal: Negative for myalgias, arthralgias, neck pain and neck stiffness.  Skin: Negative for rash.  Allergic/Immunologic: Negative for immunocompromised state.  Neurological: Negative for dizziness, weakness, light-headedness, numbness and headaches.  All other systems reviewed and are negative.     Allergies  Demerol; Erythromycin; and Sulfa antibiotics  Home Medications   Prior to Admission medications   Medication Sig Start Date End Date Taking? Authorizing Provider  ALPRAZolam Duanne Moron) 1 MG tablet Take 1 tablet (1 mg total) by mouth 4 (four) times daily. May take 1/2 tablet extra per day if needed 06/16/14  Yes Kathlee Nations, MD  aspirin EC 325 MG tablet Take 325 mg by mouth daily.   Yes Historical Provider, MD  atenolol (TENORMIN) 50 MG tablet Take 50 mg by mouth every morning.    Yes Historical Provider, MD  ibuprofen (ADVIL,MOTRIN) 200 MG tablet Take 200 mg by mouth every 6 (six) hours as needed for moderate pain (pain).    Yes Historical Provider, MD  mirtazapine (REMERON) 15 MG tablet Take 1 tablet (15 mg total) by mouth at bedtime. 06/16/14  Yes Kathlee Nations, MD  Cetirizine HCl (ZYRTEC ALLERGY) 10 MG CAPS Take 1 capsule (10 mg total) by mouth daily. 07/08/14   Demetrious Rainford A Shauntia Levengood, PA-C  fluticasone (FLONASE) 50 MCG/ACT nasal spray Place 2 sprays into  both nostrils daily. 07/08/14   Bronte Sabado A Aryaa Bunting, PA-C   BP 112/71 mmHg  Pulse 50  Temp(Src) 98.1 F (36.7 C) (Oral)  Resp 12  SpO2 95% Physical Exam  Constitutional: He appears well-developed and well-nourished. No distress.  HENT:  Head: Normocephalic and atraumatic.  Right Ear: External ear normal.  Left Ear: External ear normal.  Ear canals and TMs are normal bilaterally. There is postnasal drainage. Oropharynx is otherwise normal, no  enlarged tonsils, uvula is normal and midline. No active epistaxis and bilateral nasal passages.  Eyes: Conjunctivae are normal.  Neck: Neck supple.  Cardiovascular: Normal rate, regular rhythm and normal heart sounds.   Pulmonary/Chest: Effort normal. No respiratory distress. He has no wheezes. He has no rales.  Abdominal: Soft. Bowel sounds are normal. He exhibits no distension. There is no tenderness. There is no rebound.  Musculoskeletal: He exhibits no edema.  Neurological: He is alert.  Skin: Skin is warm and dry.  Nursing note and vitals reviewed.   ED Course  Procedures (including critical care time) Labs Review Labs Reviewed - No data to display  Imaging Review No results found.   EKG Interpretation None      MDM   Final diagnoses:  Epistaxis  Sinusitis, unspecified chronicity, unspecified location    Patient is here with a nosebleed that has stopped. His exam is consistent with allergies versus mild sinusitis. No active bleeding. Vital signs are normal. He is not on any blood thinners. He is nontoxic-appearing. Will discharge home with Flonase, Zyrtec, follow-up with primary care doctor. Advised to use saline to make sure that he is nasal passages are not dry. Instructed not to pick his nose. Not to place any objects is nose. Patient is agreeable to plan, will discharge home.  Filed Vitals:   07/08/14 2020 07/08/14 2030 07/08/14 2045 07/08/14 2100  BP: 128/52 121/74 118/54 112/71  Pulse: 48 54 50 50  Temp:      TempSrc:      Resp: 12     SpO2: 97% 96% 96% 95%     Renold Genta, PA-C 07/09/14 0113  Maudry Diego, MD 07/11/14 1327

## 2014-08-25 ENCOUNTER — Emergency Department (HOSPITAL_COMMUNITY): Payer: BC Managed Care – PPO

## 2014-08-25 ENCOUNTER — Encounter (HOSPITAL_COMMUNITY): Payer: Self-pay | Admitting: Family Medicine

## 2014-08-25 ENCOUNTER — Emergency Department (HOSPITAL_COMMUNITY)
Admission: EM | Admit: 2014-08-25 | Discharge: 2014-08-25 | Disposition: A | Discharge status: Home / Self Care | Payer: BC Managed Care – PPO | Attending: Emergency Medicine | Admitting: Emergency Medicine

## 2014-08-25 DIAGNOSIS — F329 Major depressive disorder, single episode, unspecified: Secondary | ICD-10-CM | POA: Insufficient documentation

## 2014-08-25 DIAGNOSIS — F419 Anxiety disorder, unspecified: Secondary | ICD-10-CM | POA: Diagnosis not present

## 2014-08-25 DIAGNOSIS — Z720 Tobacco use: Secondary | ICD-10-CM | POA: Diagnosis not present

## 2014-08-25 DIAGNOSIS — K602 Anal fissure, unspecified: Secondary | ICD-10-CM | POA: Insufficient documentation

## 2014-08-25 DIAGNOSIS — K5904 Chronic idiopathic constipation: Secondary | ICD-10-CM

## 2014-08-25 DIAGNOSIS — K5900 Constipation, unspecified: Secondary | ICD-10-CM | POA: Diagnosis not present

## 2014-08-25 DIAGNOSIS — J449 Chronic obstructive pulmonary disease, unspecified: Secondary | ICD-10-CM | POA: Insufficient documentation

## 2014-08-25 DIAGNOSIS — I10 Essential (primary) hypertension: Secondary | ICD-10-CM | POA: Diagnosis not present

## 2014-08-25 DIAGNOSIS — K625 Hemorrhage of anus and rectum: Secondary | ICD-10-CM | POA: Diagnosis present

## 2014-08-25 DIAGNOSIS — K644 Residual hemorrhoidal skin tags: Secondary | ICD-10-CM | POA: Insufficient documentation

## 2014-08-25 DIAGNOSIS — Z7951 Long term (current) use of inhaled steroids: Secondary | ICD-10-CM | POA: Insufficient documentation

## 2014-08-25 DIAGNOSIS — Z7982 Long term (current) use of aspirin: Secondary | ICD-10-CM | POA: Insufficient documentation

## 2014-08-25 LAB — URINALYSIS, ROUTINE W REFLEX MICROSCOPIC
Bilirubin Urine: NEGATIVE
GLUCOSE, UA: NEGATIVE mg/dL
Hgb urine dipstick: NEGATIVE
Ketones, ur: NEGATIVE mg/dL
LEUKOCYTES UA: NEGATIVE
NITRITE: NEGATIVE
PH: 6 (ref 5.0–8.0)
Protein, ur: NEGATIVE mg/dL
Specific Gravity, Urine: 1.003 — ABNORMAL LOW (ref 1.005–1.030)
Urobilinogen, UA: 0.2 mg/dL (ref 0.0–1.0)

## 2014-08-25 LAB — CBC
HEMATOCRIT: 46.6 % (ref 39.0–52.0)
Hemoglobin: 16.2 g/dL (ref 13.0–17.0)
MCH: 31.8 pg (ref 26.0–34.0)
MCHC: 34.8 g/dL (ref 30.0–36.0)
MCV: 91.6 fL (ref 78.0–100.0)
Platelets: 187 10*3/uL (ref 150–400)
RBC: 5.09 MIL/uL (ref 4.22–5.81)
RDW: 12.2 % (ref 11.5–15.5)
WBC: 7.4 10*3/uL (ref 4.0–10.5)

## 2014-08-25 LAB — COMPREHENSIVE METABOLIC PANEL
ALBUMIN: 3.8 g/dL (ref 3.5–5.2)
ALT: 16 U/L (ref 0–53)
ANION GAP: 8 (ref 5–15)
AST: 19 U/L (ref 0–37)
Alkaline Phosphatase: 108 U/L (ref 39–117)
BILIRUBIN TOTAL: 0.6 mg/dL (ref 0.3–1.2)
BUN: 6 mg/dL (ref 6–23)
CO2: 27 mmol/L (ref 19–32)
CREATININE: 0.83 mg/dL (ref 0.50–1.35)
Calcium: 9.3 mg/dL (ref 8.4–10.5)
Chloride: 101 mmol/L (ref 96–112)
GFR calc non Af Amer: >90 mL/min (ref >90)
Glucose, Bld: 129 mg/dL — ABNORMAL HIGH (ref 70–99)
Potassium: 4.2 mmol/L (ref 3.5–5.1)
Sodium: 136 mmol/L (ref 135–145)
TOTAL PROTEIN: 7.4 g/dL (ref 6.0–8.3)

## 2014-08-25 LAB — POC OCCULT BLOOD, ED: Fecal Occult Bld: NEGATIVE

## 2014-08-25 MED ORDER — POLYETHYLENE GLYCOL 3350 17 GM/SCOOP PO POWD: 17.0000 g | Freq: Two times a day (BID) | ORAL | Status: DC

## 2014-08-25 NOTE — ED Notes (Signed)
PA at BS.  

## 2014-08-25 NOTE — ED Notes (Signed)
Pt requesting to speak with EDP again before discharge.

## 2014-08-25 NOTE — ED Provider Notes (Signed)
CSN: [id number]     Arrival date & time 08/25/14  1330 History   First MD Initiated Contact with Patient 08/25/14 1846     Chief Complaint  Patient presents with  . Rectal Bleeding     (Consider location/radiation/quality/duration/timing/severity/associated sxs/prior Treatment) The history is provided by the patient and medical records. No language interpreter was used.     Roger [name] is a 59 y.o. male  with a hx of anxiety, HTN, depression, anxiety, COPD, asthma presents to the Emergency Department complaining of gradual, persistent, progressively worsening lower abd pain onset 1 week ago.  Pt reports he began to see blood on the toilet paper last week at the same time.  Pt reports he felt constipated, but took Magnesium citrate 3 weeks ago with some improvement in his constipation, but he continues to have some hard stool.  Pt reports he has not seen any blood in the toilet or mixed with his stool.  He denies melena, hematochezia.  Pt reports he was concerned because his brother was dx with a GI bleed this week and is very ill.  Pt reports he is an everyday smoker of 6-8 cigarettes, denies EtOH, pt does take 1 ASA per day, pt also takes ibuprofen 2 tablets per day.  Pt denies epigastric abd pain, hematemesis or reflux symptoms.  Pt reports his lower abd pain is more sore than anything else and is sometimes in his low back.  He denies tearing pain in his abd.  He also reports mild frequency with occasional dysuria.  Pt reports painful defecation at his anus with hard stools.  No aggravating or alleviating factors. Patient denies fever, chills, headache, neck pain, chest pain, shortness of breath, nausea, vomiting, diarrhea, weakness, dizziness, syncope, dysuria, hematuria.  Past Medical History  Diagnosis Date  . Anxiety   . Hypertension   . Depression   . Anxiety   . COPD (chronic obstructive pulmonary disease)   . Asthma    Past Surgical History  Procedure Laterality Date  .  Tonsillectomy     Family History  Problem Relation Age of Onset  . Anxiety disorder Mother   . Depression Mother   . Anxiety disorder Sister   . Depression Sister   . Anxiety disorder Brother   . Depression Brother   . Lung disease Mother     "arthritic lung"   History  Substance Use Topics  . Smoking status: Current Every Day Smoker -- 0.25 packs/day for 25 years    Types: Cigarettes  . Smokeless tobacco: Never Used  . Alcohol Use: No    Review of Systems  Constitutional: Negative for fever, diaphoresis, appetite change, fatigue and unexpected weight change.  HENT: Negative for mouth sores.   Eyes: Negative for visual disturbance.  Respiratory: Negative for cough, chest tightness, shortness of breath and wheezing.   Cardiovascular: Negative for chest pain.  Gastrointestinal: Positive for abdominal pain (lower, mild intermittent), constipation and anal bleeding. Negative for nausea, vomiting, diarrhea and blood in stool.  Endocrine: Negative for polydipsia, polyphagia and polyuria.  Genitourinary: Negative for dysuria, urgency, frequency and hematuria.  Musculoskeletal: Negative for back pain and neck stiffness.  Skin: Negative for rash.  Allergic/Immunologic: Negative for immunocompromised state.  Neurological: Negative for syncope, light-headedness and headaches.  Hematological: Does not bruise/bleed easily.  Psychiatric/Behavioral: Negative for sleep disturbance. The patient is not nervous/anxious.       Allergies  Demerol; Erythromycin; and Sulfa antibiotics  Home Medications   Prior to Admission medications  Medication Sig Start Date End Date Taking? Authorizing Provider  ALPRAZolam Duanne Moron) 1 MG tablet Take 1 tablet (1 mg total) by mouth 4 (four) times daily. May take 1/2 tablet extra per day if needed 06/16/14  Yes Kathlee Nations, MD  aspirin EC 325 MG tablet Take 325 mg by mouth daily.   Yes Historical Provider, MD  atenolol (TENORMIN) 50 MG tablet Take 50 mg  by mouth every morning.    Yes Historical Provider, MD  Cetirizine HCl (ZYRTEC ALLERGY) 10 MG CAPS Take 1 capsule (10 mg total) by mouth daily. 07/08/14  Yes Tatyana A Kirichenko, PA-C  fluticasone (FLONASE) 50 MCG/ACT nasal spray Place 2 sprays into both nostrils daily. 07/08/14  Yes Tatyana A Kirichenko, PA-C  ibuprofen (ADVIL,MOTRIN) 200 MG tablet Take 200 mg by mouth every 6 (six) hours as needed for moderate pain (pain).    Yes Historical Provider, MD  mirtazapine (REMERON) 15 MG tablet Take 1 tablet (15 mg total) by mouth at bedtime. 06/16/14  Yes Kathlee Nations, MD  polyethylene glycol powder (GLYCOLAX/MIRALAX) powder Take 17 g by mouth 2 (two) times daily. Until daily soft stools  OTC 08/25/14   Doreena Maulden, PA-C   BP 119/60 mmHg  Pulse 53  Temp(Src) 98.3 F (36.8 C)  Resp 18  SpO2 96% Physical Exam  Constitutional: He appears well-developed and well-nourished. No distress.  Awake, alert, nontoxic appearance  HENT:  Head: Normocephalic and atraumatic.  Mouth/Throat: Oropharynx is clear and moist. No oropharyngeal exudate.  Eyes: Conjunctivae are normal. No scleral icterus.  Neck: Normal range of motion. Neck supple.  Cardiovascular: Normal rate, regular rhythm, normal heart sounds and intact distal pulses.   No murmur heard. Pulmonary/Chest: Effort normal and breath sounds normal. No respiratory distress. He has no wheezes.  Equal chest expansion  Abdominal: Soft. Bowel sounds are normal. He exhibits no distension and no mass. There is no tenderness. There is no rebound and no guarding. Hernia confirmed negative in the right inguinal area and confirmed negative in the left inguinal area.  Abdomen soft and nontender No rebound or peritoneal signs No CVA tenderness  Genitourinary: Testes normal and penis normal. Rectal exam shows external hemorrhoid and fissure. Rectal exam shows no internal hemorrhoid, no mass, no tenderness and anal tone normal. Guaiac negative stool.  Prostate is not enlarged and not tender.  Rectum with a small anal fissure at the 7:00 position Small hemorrhoid noted at the 11:00 position Small amount of hard stool noted in the rectal vault with brown stool seen on the glove; no gross blood, black tarry stools noted No tenderness to the prostate or rectum Prostate is not enlarged or boggy  Musculoskeletal: Normal range of motion. He exhibits no edema.  Neurological: He is alert.  Speech is clear and goal oriented Moves extremities without ataxia  Skin: Skin is warm and dry. He is not diaphoretic.  Psychiatric: He has a normal mood and affect.  Nursing note and vitals reviewed.   ED Course  Procedures (including critical care time) Labs Review Labs Reviewed  COMPREHENSIVE METABOLIC PANEL - Abnormal; Notable for the following:    Glucose, Bld 129 (*)    All other components within normal limits  URINALYSIS, ROUTINE W REFLEX MICROSCOPIC - Abnormal; Notable for the following:    Specific Gravity, Urine 1.003 (*)    All other components within normal limits  CBC  POC OCCULT BLOOD, ED    Imaging Review Dg Abd Acute W/chest  08/25/2014   CLINICAL  DATA:  Right lower quadrant abdominal pain. Blood in stool. Nausea. Cough. COPD. Hypertension.  EXAM: ACUTE ABDOMEN SERIES (ABDOMEN 2 VIEW & CHEST 1 VIEW)  COMPARISON:  Multiple exams, including 03/28/2014 and 01/16/2014  FINDINGS: Chronic interstitial accentuation in both lungs appears stable.  Cardiac and mediastinal margins appear normal. No pleural effusion. No free intraperitoneal gas observed.  Unremarkable bowel gas pattern. No significant abnormal air-fluid levels. No signs of organomegaly or significant abnormal calcifications.  Mild lumbar spondylosis.  IMPRESSION: 1. No acute findings. 2. Chronic interstitial accentuation in both lungs at least partially due to the patient's underlying emphysema.   Electronically Signed   By: Van Clines M.D.   On: 08/25/2014 20:30     EKG  Interpretation None      MDM   Final diagnoses:  Constipation - functional  Anal fissure  External hemorrhoid   Hart [place] Garcon presents with mild, intermittent lower abdominal pain and complaints of bloody stools. Patient's history much more consistent with hemorrhoids as he has had significant constipation and sees blood only when he wipes. No melena or hematochezia. No hematemesis. Patient takes ibuprofen but has had no dark or tarry stools, epigastric pain or reflux symptoms. On exam patient with small hemorrhoid and anal fissure.  Guaiac negative. No masses in the rectum and prostate is nontender and non-boggy.  Labs reassuring. Patient without anemia or leukocytosis. Plain films of the abdomen without significant constipation but large stool burden is noted, no evidence of obstruction.  Patient to be discharged home with constipation treatment. He is disease primary care physician tomorrow for further evaluation and to schedule a colonoscopy as he has greater than 71 years of age and has not had one. No family history of colon cancer.  I have personally reviewed patient's vitals, nursing note and any pertinent labs or imaging.  I performed an undressed physical exam.    It has been determined that no acute conditions requiring further emergency intervention are present at this time. The patient/guardian have been advised of the diagnosis and plan. I reviewed all labs and imaging including any potential incidental findings. We have discussed signs and symptoms that warrant return to the ED and they are listed in the discharge instructions.    Vital signs are stable at discharge.   BP 119/60 mmHg  Pulse 53  Temp(Src) 98.3 F (36.8 C)  Resp 18  SpO2 96%        Abigail Butts, PA-C 16/10/96 0454  Delora Fuel, MD 09/81/19 1478

## 2014-08-25 NOTE — ED Notes (Signed)
Pt here for rectal burning and bleeding with wiping. St some intermittent abd pain.

## 2014-08-25 NOTE — Discharge Instructions (Signed)
1. Medications: miralax, usual home medications 2. Treatment: rest, drink plenty of fluids, take miralax 2x per day until your stools are soft then take 1x per day for maintenance; your stools should be the consistency of peanut butter 3. Follow Up: Please followup with your primary doctor in 2 days for discussion of your diagnoses and further evaluation after today's visit; if you do not have a primary care doctor use the resource guide provided to find one; Please return to the ER for worsening abdominal pain, large amounts of bloody stools, fevers or other concerns    Anal Fissure, Adult An anal fissure is a small tear or crack in the skin around the anus. Bleeding from a fissure usually stops on its own within a few minutes. However, bleeding will often reoccur with each bowel movement until the crack heals.  CAUSES   Passing large, hard stools.  Frequent diarrheal stools.  Constipation.  Inflammatory bowel disease (Crohn's disease or ulcerative colitis).  Infections.  Anal sex. SYMPTOMS   Small amounts of blood seen on your stools, on toilet paper, or in the toilet after a bowel movement.  Rectal bleeding.  Painful bowel movements.  Itching or irritation around the anus. DIAGNOSIS Your caregiver will examine the anal area. An anal fissure can usually be seen with careful inspection. A rectal exam may be performed and a short tube (anoscope) may be used to examine the anal canal. TREATMENT   You may be instructed to take fiber supplements. These supplements can soften your stool to help make bowel movements easier.  Sitz baths may be recommended to help heal the tear. Do not use soap in the sitz baths.  A medicated cream or ointment may be prescribed to lessen discomfort. HOME CARE INSTRUCTIONS   Maintain a diet high in fruits, whole grains, and vegetables. Avoid constipating foods like bananas and dairy products.  Take sitz baths as directed by your  caregiver.  Drink enough fluids to keep your urine clear or pale yellow.  Only take over-the-counter or prescription medicines for pain, discomfort, or fever as directed by your caregiver. Do not take aspirin as this may increase bleeding.  Do not use ointments containing numbing medications (anesthetics) or hydrocortisone. They could slow healing. SEEK MEDICAL CARE IF:   Your fissure is not completely healed within 3 days.  You have further bleeding.  You have a fever.  You have diarrhea mixed with blood.  You have pain.  Your problem is getting worse rather than better. MAKE SURE YOU:   Understand these instructions.  Will watch your condition.  Will get help right away if you are not doing well or get worse. Document Released: 06/20/2005 Document Revised: 09/12/2011 Document Reviewed: 12/05/2010 [facility] Patient Information 2015 [place], [place]. This information is not intended to replace advice given to you by your health care provider. Make sure you discuss any questions you have with your health care provider.    Constipation Constipation is when a person has fewer than three bowel movements a week, has difficulty having a bowel movement, or has stools that are dry, hard, or larger than normal. As people grow older, constipation is more common. If you try to fix constipation with medicines that make you have a bowel movement (laxatives), the problem may get worse. Long-term laxative use may cause the muscles of the colon to become weak. A low-fiber diet, not taking in enough fluids, and taking certain medicines may make constipation worse.  CAUSES   Certain medicines,  such as antidepressants, pain medicine, iron supplements, antacids, and water pills.   Certain diseases, such as diabetes, irritable bowel syndrome (IBS), thyroid disease, or depression.   Not drinking enough water.   Not eating enough fiber-rich foods.   Stress or travel.   Lack of physical  activity or exercise.   Ignoring the urge to have a bowel movement.   Using laxatives too much.  SIGNS AND SYMPTOMS   Having fewer than three bowel movements a week.   Straining to have a bowel movement.   Having stools that are hard, dry, or larger than normal.   Feeling full or bloated.   Pain in the lower abdomen.   Not feeling relief after having a bowel movement.  DIAGNOSIS  Your health care provider will take a medical history and perform a physical exam. Further testing may be done for severe constipation. Some tests may include:  A barium enema X-ray to examine your rectum, colon, and, sometimes, your small intestine.   A sigmoidoscopy to examine your lower colon.   A colonoscopy to examine your entire colon. TREATMENT  Treatment will depend on the severity of your constipation and what is causing it. Some dietary treatments include drinking more fluids and eating more fiber-rich foods. Lifestyle treatments may include regular exercise. If these diet and lifestyle recommendations do not help, your health care provider may recommend taking over-the-counter laxative medicines to help you have bowel movements. Prescription medicines may be prescribed if over-the-counter medicines do not work.  HOME CARE INSTRUCTIONS   Eat foods that have a lot of fiber, such as fruits, vegetables, whole grains, and beans.  Limit foods high in fat and processed sugars, such as french fries, hamburgers, cookies, candies, and soda.   A fiber supplement may be added to your diet if you cannot get enough fiber from foods.   Drink enough fluids to keep your urine clear or pale yellow.   Exercise regularly or as directed by your health care provider.   Go to the restroom when you have the urge to go. Do not hold it.   Only take over-the-counter or prescription medicines as directed by your health care provider. Do not take other medicines for constipation without talking to  your health care provider first.  Tok IF:   You have bright red blood in your stool.   Your constipation lasts for more than 4 days or gets worse.   You have abdominal or rectal pain.   You have thin, pencil-like stools.   You have unexplained weight loss. MAKE SURE YOU:   Understand these instructions.  Will watch your condition.  Will get help right away if you are not doing well or get worse. Document Released: 03/18/2004 Document Revised: 06/25/2013 Document Reviewed: 04/01/2013 [facility] Patient Information 2015 [place], [place]. This information is not intended to replace advice given to you by your health care provider. Make sure you discuss any questions you have with your health care provider.

## 2014-08-26 ENCOUNTER — Telehealth (HOSPITAL_BASED_OUTPATIENT_CLINIC_OR_DEPARTMENT_OTHER): Payer: Self-pay | Admitting: Emergency Medicine

## 2014-09-15 ENCOUNTER — Encounter (HOSPITAL_COMMUNITY): Payer: Self-pay | Admitting: Psychiatry

## 2014-09-15 ENCOUNTER — Ambulatory Visit (INDEPENDENT_AMBULATORY_CARE_PROVIDER_SITE_OTHER): Payer: BC Managed Care – PPO | Admitting: Psychiatry

## 2014-09-15 VITALS — BP 120/78 | HR 62 | Ht 71.0 in | Wt 184.0 lb

## 2014-09-15 DIAGNOSIS — F411 Generalized anxiety disorder: Secondary | ICD-10-CM

## 2014-09-15 MED ORDER — ALPRAZOLAM 1 MG PO TABS: 1.0000 mg | ORAL_TABLET | Freq: Four times a day (QID) | ORAL | Status: DC

## 2014-09-15 MED ORDER — MIRTAZAPINE 15 MG PO TABS: 15.0000 mg | ORAL_TABLET | Freq: Every day | ORAL | Status: DC

## 2014-09-15 NOTE — Progress Notes (Signed)
[facility] [phone] Progress Note  JAHMEEK SHIRK [id number] 59 y.o.  09/15/2014 2:31 PM  Chief Complaint: I am very anxious and nervous because my brother diagnosed with stomach cancer.    History of Present Illness: Mable came for his followup appointment.  He has been more anxious nervous and admitted having crying spells.  On February 27 his brother was admitted to the hospital because of stomach pain and later find out that he has stomach cancer.  He had a surgery but he is not ready for chemotherapy or radiation.  Patient told that he is very close to his brother and he's been very anxious nervous and devastated with the recent use.  His been sleeping 4-5 hours.  He visited twice to the emergency room because of rectal pain and he was very worried about himself that if he had any cancer.  Patient was relieved that it found to be only hemorrhoids.  He admitted some time crying spells but denies any feeling of hopelessness or worthlessness.  He is taking Xanax as prescribed and Remeron at bedtime.  He denies any suicidal thoughts or homicidal thought.  He denies any major panic attack but admitted more anxious and nervous from the past.  He reported his job is going okay and he has been not watching his favorite sports which is basketball.  He regret that he had missed a lot of games but he believed family is fast.  He mentioned that his brother and sisters are devastated and he is trying to keep family together.  Patient lives with his sister.  Patient denies drinking or using any illegal substances.  He has no tremors or shakes.  His appetite is okay.  He believes he lost weight however his vitals are stable.  He is working as a Sports coach in the school.    Suicidal Ideation: No Plan Formed: No Patient has means to carry out plan: No   Homicidal Ideation: No Plan Formed: No Patient has means to carry out plan: No  Review of Systems  Constitutional: Negative.   HENT: Negative.    Psychiatric/Behavioral: Positive for depression. Negative for suicidal ideas and substance abuse. The patient is nervous/anxious.      Psychiatric: Agitation: No Hallucination: No Depressed Mood: Yes Insomnia: No Hypersomnia: No Altered Concentration: No Feels Worthless: No Grandiose Ideas: No Belief In Special Powers: No New/Increased Substance Abuse: No Compulsions: No  Neurologic: Headache: No Seizure: No Paresthesias: No  Past Medical Family, Social History:  Patient has hypertension and he see Dr. Laverle Patter at [place] [facility].     Outpatient Encounter Prescriptions as of 09/15/2014  Medication Sig  . ALPRAZolam (XANAX) 1 MG tablet Take 1 tablet (1 mg total) by mouth 4 (four) times daily. May take 1/2 tablet extra per day if needed  . aspirin EC 325 MG tablet Take 325 mg by mouth daily.  Marland Kitchen atenolol (TENORMIN) 50 MG tablet Take 50 mg by mouth every morning.   . Cetirizine HCl (ZYRTEC ALLERGY) 10 MG CAPS Take 1 capsule (10 mg total) by mouth daily.  . fluticasone (FLONASE) 50 MCG/ACT nasal spray Place 2 sprays into both nostrils daily.  Marland Kitchen ibuprofen (ADVIL,MOTRIN) 200 MG tablet Take 200 mg by mouth every 6 (six) hours as needed for moderate pain (pain).   . mirtazapine (REMERON) 15 MG tablet Take 1 tablet (15 mg total) by mouth at bedtime.  . polyethylene glycol powder (GLYCOLAX/MIRALAX) powder Take 17 g by mouth 2 (two) times  daily. Until daily soft stools  OTC  . [DISCONTINUED] ALPRAZolam (XANAX) 1 MG tablet Take 1 tablet (1 mg total) by mouth 4 (four) times daily. May take 1/2 tablet extra per day if needed  . [DISCONTINUED] mirtazapine (REMERON) 15 MG tablet Take 1 tablet (15 mg total) by mouth at bedtime.    Past Psychiatric History/Hospitalization(s): Patient denies any inpatient psychiatric treatment.  He done an intensive outpatient program in 2013.  At that time he has multiple losses in his life including his business and home.  He has taken  amitriptyline in the past. Anxiety: Yes Bipolar Disorder: No Depression: Yes Mania: No Psychosis: No Schizophrenia: No Personality Disorder: No Hospitalization for psychiatric illness: No History of Electroconvulsive Shock Therapy: No Prior Suicide Attempts: No  Physical Exam: Constitutional:  BP 120/78 mmHg  Pulse 62  Ht $R'5\' 11"'Ut$  (1.803 m)  Wt 184 lb (83.462 kg)  BMI 25.67 kg/m2  Recent Results (from the past 2160 hour(s))  Comprehensive metabolic panel     Status: Abnormal   Collection Time: 08/25/14  2:21 PM  Result Value Ref Range   Sodium 136 135 - 145 mmol/L   Potassium 4.2 3.5 - 5.1 mmol/L   Chloride 101 96 - 112 mmol/L   CO2 27 19 - 32 mmol/L   Glucose, Bld 129 (H) 70 - 99 mg/dL   BUN 6 6 - 23 mg/dL   Creatinine, Ser 0.83 0.50 - 1.35 mg/dL   Calcium 9.3 8.4 - 10.5 mg/dL   Total Protein 7.4 6.0 - 8.3 g/dL   Albumin 3.8 3.5 - 5.2 g/dL   AST 19 0 - 37 U/L   ALT 16 0 - 53 U/L   Alkaline Phosphatase 108 39 - 117 U/L   Total Bilirubin 0.6 0.3 - 1.2 mg/dL   GFR calc non Af Amer >90 >90 mL/min   GFR calc Af Amer >90 >90 mL/min    Comment: (NOTE) The eGFR has been calculated using the CKD EPI equation. This calculation has not been validated in all clinical situations. eGFR's persistently <90 mL/min signify possible Chronic Kidney Disease.    Anion gap 8 5 - 15  CBC     Status: None   Collection Time: 08/25/14  2:21 PM  Result Value Ref Range   WBC 7.4 4.0 - 10.5 K/uL   RBC 5.09 4.22 - 5.81 MIL/uL   Hemoglobin 16.2 13.0 - 17.0 g/dL   HCT 46.6 39.0 - 52.0 %   MCV 91.6 78.0 - 100.0 fL   MCH 31.8 26.0 - 34.0 pg   MCHC 34.8 30.0 - 36.0 g/dL   RDW 12.2 11.5 - 15.5 %   Platelets 187 150 - 400 K/uL  POC occult blood, ED     Status: None   Collection Time: 08/25/14  7:26 PM  Result Value Ref Range   Fecal Occult Bld NEGATIVE NEGATIVE  Urinalysis, Routine w reflex microscopic     Status: Abnormal   Collection Time: 08/25/14  7:28 PM  Result Value Ref Range    Color, Urine YELLOW YELLOW   APPearance CLEAR CLEAR   Specific Gravity, Urine 1.003 (L) 1.005 - 1.030   pH 6.0 5.0 - 8.0   Glucose, UA NEGATIVE NEGATIVE mg/dL   Hgb urine dipstick NEGATIVE NEGATIVE   Bilirubin Urine NEGATIVE NEGATIVE   Ketones, ur NEGATIVE NEGATIVE mg/dL   Protein, ur NEGATIVE NEGATIVE mg/dL   Urobilinogen, UA 0.2 0.0 - 1.0 mg/dL   Nitrite NEGATIVE NEGATIVE   Leukocytes, UA NEGATIVE  NEGATIVE    Comment: MICROSCOPIC NOT DONE ON URINES WITH NEGATIVE PROTEIN, BLOOD, LEUKOCYTES, NITRITE, OR GLUCOSE <1000 mg/dL.   General Appearance: well nourished  Musculoskeletal: Strength & Muscle Tone: within normal limits Gait & Station: normal Patient leans: N/A  Mental status examination: Patient is casually dressed and fairly groomed.  He appears anxious and easily tearful.  He described his mood sad and depressed and his affect is constricted.  His attention and consultation is fair.  His thought process logical and goal-directed.  His speech is fast but fluent and coherent.  There were no delusions, paranoia or any obsessive thoughts.  He denies any auditory or visual hallucination.  He denies any active or passive suicidal thoughts or homicidal thought.  His fund of knowledge is good.  His psychomotor activity is okay.  There were no tremors, shakes or any EPS.  His cognition is good.  His insight judgment and impulse control is okay.  Established Problem, Stable/Improving (1), Review of Psycho-Social Stressors (1), Review of Last Therapy Session (1) and Review of Medication Regimen & Side Effects (2)  Assessment: Axis I: Generalized anxiety disorder  Axis II: Deferred  Axis III: See medical history  Plan:  I reviewed records from his recent emergency room including blood test.  He is relieved that he was diagnosed with hemorrhoid since his brother recently diagnosed with colon cancer.  He admitted increased anxiety and nervousness because of the family situation however he  does not feel he need a higher dose of medication.  He wants to keep his current medication Remeron 15 mg at bedtime and Xanax 1 mg 4 times a day and half as needed.  However I suggested to see a therapist for coping skills since his psychosocial stressors has been increased.  He agreed and will schedule appointment with Joaquim Lai.  I recommended to call us back if he feel worsening of the symptom.  I will see him again in 12 weeks unless he decided to come early. Continue Remeron 15 mg at bedtime and Xanax 1 mg 4 times a day and half tablet extra as needed.  Patient keep his Xanax in a lockbox and does not ask for early refills.  Discussed benzodiazepine dependency, tolerance and withdrawal. Time spent 25 minutes.  More than 50% of the time spent in psychoeducation, counseling and coordination of care.  Discuss safety plan that anytime having active suicidal thoughts or homicidal thoughts then patient need to call 911 or go to the local emergency room.   Timohty Renbarger T., MD 09/15/2014

## 2014-09-17 ENCOUNTER — Encounter: Payer: Self-pay | Admitting: Internal Medicine

## 2014-10-13 ENCOUNTER — Encounter (HOSPITAL_COMMUNITY): Payer: Self-pay | Admitting: Emergency Medicine

## 2014-10-13 ENCOUNTER — Emergency Department (HOSPITAL_COMMUNITY)
Admission: EM | Admit: 2014-10-13 | Discharge: 2014-10-13 | Disposition: A | Discharge status: Home / Self Care | Payer: BC Managed Care – PPO | Attending: Emergency Medicine | Admitting: Emergency Medicine

## 2014-10-13 DIAGNOSIS — K6289 Other specified diseases of anus and rectum: Secondary | ICD-10-CM | POA: Diagnosis not present

## 2014-10-13 DIAGNOSIS — R109 Unspecified abdominal pain: Secondary | ICD-10-CM

## 2014-10-13 DIAGNOSIS — F329 Major depressive disorder, single episode, unspecified: Secondary | ICD-10-CM | POA: Insufficient documentation

## 2014-10-13 DIAGNOSIS — Z7982 Long term (current) use of aspirin: Secondary | ICD-10-CM | POA: Diagnosis not present

## 2014-10-13 DIAGNOSIS — Z7951 Long term (current) use of inhaled steroids: Secondary | ICD-10-CM | POA: Diagnosis not present

## 2014-10-13 DIAGNOSIS — I10 Essential (primary) hypertension: Secondary | ICD-10-CM | POA: Diagnosis not present

## 2014-10-13 DIAGNOSIS — M545 Low back pain, unspecified: Secondary | ICD-10-CM

## 2014-10-13 DIAGNOSIS — J449 Chronic obstructive pulmonary disease, unspecified: Secondary | ICD-10-CM | POA: Insufficient documentation

## 2014-10-13 DIAGNOSIS — R350 Frequency of micturition: Secondary | ICD-10-CM | POA: Diagnosis not present

## 2014-10-13 DIAGNOSIS — Z720 Tobacco use: Secondary | ICD-10-CM | POA: Insufficient documentation

## 2014-10-13 DIAGNOSIS — K5900 Constipation, unspecified: Secondary | ICD-10-CM | POA: Diagnosis not present

## 2014-10-13 DIAGNOSIS — F419 Anxiety disorder, unspecified: Secondary | ICD-10-CM | POA: Diagnosis not present

## 2014-10-13 DIAGNOSIS — Z79899 Other long term (current) drug therapy: Secondary | ICD-10-CM | POA: Diagnosis not present

## 2014-10-13 MED ORDER — POLYETHYLENE GLYCOL 3350 17 GM/SCOOP PO POWD: 17.0000 g | Freq: Two times a day (BID) | ORAL | Status: DC

## 2014-10-13 MED ORDER — NAPROXEN 500 MG PO TABS
500.0000 mg | ORAL_TABLET | Freq: Two times a day (BID) | ORAL | Status: DC
Start: 2014-10-13 — End: 2014-11-12

## 2014-10-13 MED ORDER — DOCUSATE SODIUM 100 MG PO CAPS: 100.0000 mg | ORAL_CAPSULE | Freq: Two times a day (BID) | ORAL | Status: DC

## 2014-10-13 NOTE — Discharge Instructions (Signed)
Lumbosacral Strain Lumbosacral strain is a strain of any of the parts that make up your lumbosacral vertebrae. Your lumbosacral vertebrae are the bones that make up the lower third of your backbone. Your lumbosacral vertebrae are held together by muscles and tough, fibrous tissue (ligaments).  CAUSES  A sudden blow to your back can cause lumbosacral strain. Also, anything that causes an excessive stretch of the muscles in the low back can cause this strain. This is typically seen when people exert themselves strenuously, fall, lift heavy objects, bend, or crouch repeatedly. RISK FACTORS  Physically demanding work.  Participation in pushing or pulling sports or sports that require a sudden twist of the back (tennis, golf, baseball).  Weight lifting.  Excessive lower back curvature.  Forward-tilted pelvis.  Weak back or abdominal muscles or both.  Tight hamstrings. SIGNS AND SYMPTOMS  Lumbosacral strain may cause pain in the area of your injury or pain that moves (radiates) down your leg.  DIAGNOSIS Your health care provider can often diagnose lumbosacral strain through a physical exam. In some cases, you may need tests such as X-ray exams.  TREATMENT  Treatment for your lower back injury depends on many factors that your clinician will have to evaluate. However, most treatment will include the use of anti-inflammatory medicines. HOME CARE INSTRUCTIONS   Avoid hard physical activities (tennis, racquetball, waterskiing) if you are not in proper physical condition for it. This may aggravate or create problems.  If you have a back problem, avoid sports requiring sudden body movements. Swimming and walking are generally safer activities.  Maintain good posture.  Maintain a healthy weight.  For acute conditions, you may put ice on the injured area.  Put ice in a plastic bag.  Place a towel between your skin and the bag.  Leave the ice on for 20 minutes, 2-3 times a day.  When the  low back starts healing, stretching and strengthening exercises may be recommended. SEEK MEDICAL CARE IF:  Your back pain is getting worse.  You experience severe back pain not relieved with medicines. SEEK IMMEDIATE MEDICAL CARE IF:   You have numbness, tingling, weakness, or problems with the use of your arms or legs.  There is a change in bowel or bladder control.  You have increasing pain in any area of the body, including your belly (abdomen).  You notice shortness of breath, dizziness, or feel faint.  You feel sick to your stomach (nauseous), are throwing up (vomiting), or become sweaty.  You notice discoloration of your toes or legs, or your feet get very cold. MAKE SURE YOU:   Understand these instructions.  Will watch your condition.  Will get help right away if you are not doing well or get worse. Document Released: 03/30/2005 Document Revised: 06/25/2013 Document Reviewed: 02/06/2013 [facility] Patient Information 2015 [place], [place]. This information is not intended to replace advice given to you by your health care provider. Make sure you discuss any questions you have with your health care provider.  Please take your medications as prescribed. He will need to follow-up with her primary care for further evaluation and management of your symptoms. Return to ED for new or worsening symptoms.

## 2014-10-13 NOTE — ED Notes (Signed)
Patient states went to his primary doctor last Wednesday and doctor checked his hemorrhoids, and prostate.  Patient states since then he has had lower back pain and rectum.   Patient states he did receive medication for hemorrhoids.  Patient states he has been taking ibuprofen, and that seems to help for pain.

## 2014-10-13 NOTE — ED Provider Notes (Signed)
CSN: [id number]     Arrival date & time 10/13/14  1054 History  This chart was scribed for non-physician practitioner Comer Locket, PA-C working with Virgel Manifold, MD by Zola Button, ED Scribe. This patient was seen in room TR07C/TR07C and the patient's care was started at 11:12 AM.      Chief Complaint  Patient presents with  . Back Pain  . Rectal Pain   The history is provided by the patient. No language interpreter was used.   HPI Comments: Roger [name] is a 59 y.o. male who presents to the Emergency Department complaining of gradual onset, constant, aching lower back pain radiating around to his abdomen that started 5 days ago after seeing his PCP. The back pain is described as a 6/10 in severity; he has had to call out of work due to the pain. Patient had seen his PCP for a complete physical and evaluation of hemorrhoids; his PCP did check his hemorrhoids and prostate. He thinks his PCP may have done something wrong when doing the rectal exam that could have caused his back pain. He had called his PCP about this and was prescribed a cream, which he states did not provide relief. Patient still has rectal pain and also reports having urinary frequency and constipation. His last bowel movement was 2 days ago, which was a small bowel movement. He has not taken any stool softeners. Patient has been taking Advil every 4 hours for the back pain. He has been avoiding food because of the rectal pain. He denies dysuria, nausea, vomiting, fever, rectal bleeding, dark stools and blood in stool.   Past Medical History  Diagnosis Date  . Anxiety   . Hypertension   . Depression   . Anxiety   . COPD (chronic obstructive pulmonary disease)   . Asthma    Past Surgical History  Procedure Laterality Date  . Tonsillectomy     Family History  Problem Relation Age of Onset  . Anxiety disorder Mother   . Depression Mother   . Anxiety disorder Sister   . Depression Sister   . Anxiety disorder Brother    . Depression Brother   . Lung disease Mother     "arthritic lung"   History  Substance Use Topics  . Smoking status: Current Every Day Smoker -- 0.25 packs/day for 25 years    Types: Cigarettes  . Smokeless tobacco: Never Used  . Alcohol Use: No    Review of Systems  Constitutional: Negative for fever.  Gastrointestinal: Positive for abdominal pain, constipation and rectal pain. Negative for nausea, vomiting, blood in stool and anal bleeding.  Genitourinary: Positive for frequency. Negative for dysuria.  Musculoskeletal: Positive for back pain.  All other systems reviewed and are negative.     Allergies  Demerol; Erythromycin; and Sulfa antibiotics  Home Medications   Prior to Admission medications   Medication Sig Start Date End Date Taking? Authorizing Provider  ALPRAZolam Duanne Moron) 1 MG tablet Take 1 tablet (1 mg total) by mouth 4 (four) times daily. May take 1/2 tablet extra per day if needed 09/15/14   Kathlee Nations, MD  aspirin EC 325 MG tablet Take 325 mg by mouth daily.    Historical Provider, MD  atenolol (TENORMIN) 50 MG tablet Take 50 mg by mouth every morning.     Historical Provider, MD  Cetirizine HCl (ZYRTEC ALLERGY) 10 MG CAPS Take 1 capsule (10 mg total) by mouth daily. 07/08/14   Jeannett Senior, PA-C  docusate sodium (COLACE) 100 MG capsule Take 1 capsule (100 mg total) by mouth every 12 (twelve) hours. 10/13/14   Comer Locket, PA-C  fluticasone (FLONASE) 50 MCG/ACT nasal spray Place 2 sprays into both nostrils daily. 07/08/14   Tatyana Kirichenko, PA-C  ibuprofen (ADVIL,MOTRIN) 200 MG tablet Take 200 mg by mouth every 6 (six) hours as needed for moderate pain (pain).     Historical Provider, MD  mirtazapine (REMERON) 15 MG tablet Take 1 tablet (15 mg total) by mouth at bedtime. 09/15/14   Kathlee Nations, MD  naproxen (NAPROSYN) 500 MG tablet Take 1 tablet (500 mg total) by mouth 2 (two) times daily. 10/13/14   Comer Locket, PA-C  polyethylene glycol powder  (GLYCOLAX/MIRALAX) powder Take 17 g by mouth 2 (two) times daily. Until daily soft stools  OTC 10/13/14   Comer Locket, PA-C   BP 130/59 mmHg  Pulse 64  Temp(Src) 97.4 F (36.3 C) (Oral)  Resp 18  SpO2 100% Physical Exam  Constitutional: He is oriented to person, place, and time. He appears well-developed and well-nourished. No distress.  HENT:  Head: Normocephalic and atraumatic.  Mouth/Throat: Oropharynx is clear and moist. No oropharyngeal exudate.  Eyes: Pupils are equal, round, and reactive to light.  Neck: Neck supple.  Cardiovascular: Normal rate.   Pulmonary/Chest: Effort normal and breath sounds normal. No respiratory distress. He has no wheezes. He has no rales.  CTAB.  Abdominal: Soft. He exhibits no distension. There is tenderness. There is no rebound and no guarding.  Diffuse lower abdominal tenderness.  Genitourinary:  Normal sphincter tone. No masses or deformities noted in rectal vault. Small non-thrombosed hemorrhoid at the 9 o'clock position. No fissures or other lesions noted. Soft brown stool on exam glove.  Musculoskeletal: He exhibits tenderness. He exhibits no edema.  Diffuse tenderness in paraspinal lumbar region with no overt midline bony tenderness.  Neurological: He is alert and oriented to person, place, and time. No cranial nerve deficit.  Normal neuro exam.  Skin: Skin is warm and dry. No rash noted.  Psychiatric: He has a normal mood and affect. His behavior is normal.  Nursing note and vitals reviewed.   ED Course  Procedures  DIAGNOSTIC STUDIES: Oxygen Saturation is 100% on room air, normal by my interpretation.    COORDINATION OF CARE: 11:26 AM-Discussed treatment plan which includes stool softener and anti-inflammatories with pt at bedside and pt agreed to plan.    Labs Review Labs Reviewed - No data to display  Imaging Review No results found.   EKG Interpretation None     Meds given in ED:  Medications - No data to  display  Discharge Medication List as of 10/13/2014 11:41 AM    START taking these medications   Details  docusate sodium (COLACE) 100 MG capsule Take 1 capsule (100 mg total) by mouth every 12 (twelve) hours., Starting 10/13/2014, Until Discontinued, Print    naproxen (NAPROSYN) 500 MG tablet Take 1 tablet (500 mg total) by mouth 2 (two) times daily., Starting 10/13/2014, Until Discontinued, Print       Filed Vitals:   10/13/14 1101  BP: 130/59  Pulse: 64  Temp: 97.4 F (36.3 C)  TempSrc: Oral  Resp: 18  SpO2: 100%    MDM  Vitals stable - WNL -afebrile Pt resting comfortably in ED. PE--tenderness diffusely in paraspinal lumbar region with no overt midline bony tenderness. Normal rectal exam. Abd exam not concerning. Otherwise benign physical exam.  DDX-patient presents with rectal pain  following rectal exam completed by his primary care provider last Wednesday. Physical exam does not show any evidence of tissue rupture or other trauma. Back pain does not appear to be associated with rectal discomfort. Abdominal discomfort likely secondary to constipation. No evidence of other acute or emergent pathology at this time. Will DC with MiraLAX and stool softeners.   I discussed all relevant lab findings and imaging results with pt and they verbalized understanding. Discussed f/u with PCP within 48 hrs and return precautions, pt very amenable to plan.  Final diagnoses:  Bilateral low back pain without sciatica  Rectal pain  Abdominal discomfort    I personally performed the services described in this documentation, which was scribed in my presence. The recorded information has been reviewed and is accurate.   Comer Locket, PA-C 10/13/14 1656  Virgel Manifold, MD 10/15/14 [phone]

## 2014-10-15 ENCOUNTER — Emergency Department (HOSPITAL_COMMUNITY)
Admission: EM | Admit: 2014-10-15 | Discharge: 2014-10-15 | Disposition: A | Discharge status: Home / Self Care | Payer: BC Managed Care – PPO | Attending: Emergency Medicine | Admitting: Emergency Medicine

## 2014-10-15 ENCOUNTER — Encounter (HOSPITAL_COMMUNITY): Payer: Self-pay | Admitting: Family Medicine

## 2014-10-15 DIAGNOSIS — Y999 Unspecified external cause status: Secondary | ICD-10-CM | POA: Insufficient documentation

## 2014-10-15 DIAGNOSIS — S39012A Strain of muscle, fascia and tendon of lower back, initial encounter: Secondary | ICD-10-CM | POA: Diagnosis not present

## 2014-10-15 DIAGNOSIS — F329 Major depressive disorder, single episode, unspecified: Secondary | ICD-10-CM | POA: Insufficient documentation

## 2014-10-15 DIAGNOSIS — Z7982 Long term (current) use of aspirin: Secondary | ICD-10-CM | POA: Diagnosis not present

## 2014-10-15 DIAGNOSIS — M545 Low back pain: Secondary | ICD-10-CM | POA: Diagnosis present

## 2014-10-15 DIAGNOSIS — J449 Chronic obstructive pulmonary disease, unspecified: Secondary | ICD-10-CM | POA: Insufficient documentation

## 2014-10-15 DIAGNOSIS — Z79899 Other long term (current) drug therapy: Secondary | ICD-10-CM | POA: Diagnosis not present

## 2014-10-15 DIAGNOSIS — I10 Essential (primary) hypertension: Secondary | ICD-10-CM | POA: Diagnosis not present

## 2014-10-15 DIAGNOSIS — X58XXXA Exposure to other specified factors, initial encounter: Secondary | ICD-10-CM | POA: Insufficient documentation

## 2014-10-15 DIAGNOSIS — Z751 Person awaiting admission to adequate facility elsewhere: Secondary | ICD-10-CM | POA: Diagnosis not present

## 2014-10-15 DIAGNOSIS — K6289 Other specified diseases of anus and rectum: Secondary | ICD-10-CM | POA: Insufficient documentation

## 2014-10-15 DIAGNOSIS — Y939 Activity, unspecified: Secondary | ICD-10-CM | POA: Insufficient documentation

## 2014-10-15 DIAGNOSIS — F419 Anxiety disorder, unspecified: Secondary | ICD-10-CM | POA: Insufficient documentation

## 2014-10-15 DIAGNOSIS — Y929 Unspecified place or not applicable: Secondary | ICD-10-CM | POA: Diagnosis not present

## 2014-10-15 DIAGNOSIS — Z720 Tobacco use: Secondary | ICD-10-CM | POA: Diagnosis not present

## 2014-10-15 LAB — URINALYSIS, ROUTINE W REFLEX MICROSCOPIC
Bilirubin Urine: NEGATIVE
Glucose, UA: NEGATIVE mg/dL
Hgb urine dipstick: NEGATIVE
Ketones, ur: NEGATIVE mg/dL
Leukocytes, UA: NEGATIVE
Nitrite: NEGATIVE
Protein, ur: NEGATIVE mg/dL
Specific Gravity, Urine: 1.009 (ref 1.005–1.030)
Urobilinogen, UA: 0.2 mg/dL (ref 0.0–1.0)
pH: 5.5 (ref 5.0–8.0)

## 2014-10-15 MED ORDER — HYDROCORTISONE ACE-PRAMOXINE 2.5-1 % RE CREA: 1.0000 "application " | TOPICAL_CREAM | Freq: Three times a day (TID) | RECTAL | Status: DC

## 2014-10-15 MED ORDER — SODIUM CHLORIDE 0.9 % IV BOLUS (SEPSIS): 1000.0000 mL | Freq: Once | INTRAVENOUS | Status: DC

## 2014-10-15 MED ORDER — HYDROCODONE-ACETAMINOPHEN 5-325 MG PO TABS: 1.0000 | ORAL_TABLET | Freq: Four times a day (QID) | ORAL | Status: DC | PRN

## 2014-10-15 MED ORDER — IBUPROFEN 800 MG PO TABS: 800.0000 mg | ORAL_TABLET | Freq: Three times a day (TID) | ORAL | Status: DC | PRN

## 2014-10-15 NOTE — ED Provider Notes (Signed)
CSN: [id number]     Arrival date & time 10/15/14  1048 History   First MD Initiated Contact with Patient 10/15/14 1507     Chief Complaint  Patient presents with  . Back Pain  . Hemorrhoids     (Consider location/radiation/quality/duration/timing/severity/associated sxs/prior Treatment) HPI Patient presents to the emergency department with lower back pain and rectal pain that started last week.  The patient states that he had a digital rectal exam at his doctor's office during a routine physical and has noted pain since that time and also had lower back pain as well.  The patient states that not sure of any injuries to his back and does not recall doing any activity that could cause this.  The patient states that he is not having any nausea, vomiting, weakness, dizziness, headache, blurred vision, chest pain, shortness of breath, neck pain, fever, cough, runny nose, sore throat, rash, or syncope.  The patient states he has had some intermittent lower abdominal pain, but is not currently having this pain.  The patient states that he has had some trouble with hesitancy of urine.  The patient states that nothing seems make his condition better, but movement and palpation make the back pain worse. Past Medical History  Diagnosis Date  . Anxiety   . Hypertension   . Depression   . Anxiety   . COPD (chronic obstructive pulmonary disease)   . Asthma    Past Surgical History  Procedure Laterality Date  . Tonsillectomy     Family History  Problem Relation Age of Onset  . Anxiety disorder Mother   . Depression Mother   . Anxiety disorder Sister   . Depression Sister   . Anxiety disorder Brother   . Depression Brother   . Lung disease Mother     "arthritic lung"   History  Substance Use Topics  . Smoking status: Current Every Day Smoker -- 0.25 packs/day for 25 years    Types: Cigarettes  . Smokeless tobacco: Never Used  . Alcohol Use: No    Review of Systems All other systems  negative except as documented in the HPI. All pertinent positives and negatives as reviewed in the HPI. Allergies  Demerol; Erythromycin; and Sulfa antibiotics  Home Medications   Prior to Admission medications   Medication Sig Start Date End Date Taking? Authorizing Provider  ALPRAZolam Duanne Moron) 1 MG tablet Take 1 tablet (1 mg total) by mouth 4 (four) times daily. May take 1/2 tablet extra per day if needed 09/15/14  Yes Kathlee Nations, MD  aspirin EC 325 MG tablet Take 325 mg by mouth daily.   Yes Historical Provider, MD  atenolol (TENORMIN) 50 MG tablet Take 50 mg by mouth every morning.    Yes Historical Provider, MD  docusate sodium (COLACE) 100 MG capsule Take 1 capsule (100 mg total) by mouth every 12 (twelve) hours. 10/13/14  Yes Benjamin Cartner, PA-C  fluticasone (FLONASE) 50 MCG/ACT nasal spray Place 2 sprays into both nostrils daily. Patient taking differently: Place 2 sprays into both nostrils as needed for allergies.  07/08/14  Yes Tatyana Kirichenko, PA-C  ibuprofen (ADVIL,MOTRIN) 200 MG tablet Take 200 mg by mouth every 6 (six) hours as needed for moderate pain (pain).    Yes Historical Provider, MD  mirtazapine (REMERON) 15 MG tablet Take 1 tablet (15 mg total) by mouth at bedtime. 09/15/14  Yes Kathlee Nations, MD  polyethylene glycol powder (GLYCOLAX/MIRALAX) powder Take 17 g by mouth 2 (two) times  daily. Until daily soft stools  OTC 10/13/14  Yes Comer Locket, PA-C  Cetirizine HCl (ZYRTEC ALLERGY) 10 MG CAPS Take 1 capsule (10 mg total) by mouth daily. 07/08/14   Tatyana Kirichenko, PA-C  naproxen (NAPROSYN) 500 MG tablet Take 1 tablet (500 mg total) by mouth 2 (two) times daily. 10/13/14   Benjamin Cartner, PA-C   BP 114/48 mmHg  Pulse 48  Temp(Src) 97.6 F (36.4 C) (Oral)  Resp 17  Ht 5\' 11"  (1.803 m)  Wt 186 lb (84.369 kg)  BMI 25.95 kg/m2  SpO2 93% Physical Exam  Constitutional: He is oriented to person, place, and time. He appears well-developed and well-nourished. No  distress.  HENT:  Head: Normocephalic and atraumatic.  Mouth/Throat: Oropharynx is clear and moist.  Eyes: Pupils are equal, round, and reactive to light.  Neck: Normal range of motion. Neck supple.  Cardiovascular: Normal rate, regular rhythm and normal heart sounds.  Exam reveals no gallop and no friction rub.   No murmur heard. Pulmonary/Chest: Effort normal and breath sounds normal. No respiratory distress.  Abdominal: Soft. Bowel sounds are normal. He exhibits no distension. There is no tenderness. There is no rebound and no guarding.  Musculoskeletal: He exhibits no edema.  Neurological: He is alert and oriented to person, place, and time. He exhibits normal muscle tone. Coordination normal.  Skin: Skin is warm and dry. No rash noted. No erythema.  Nursing note and vitals reviewed.   ED Course  Procedures (including critical care time) Labs Review Labs Reviewed  URINALYSIS, ROUTINE W REFLEX MICROSCOPIC    The patient did not want any further testing.  I did ask him to provide a urine so we could take a look, it is urinalysis.  For any possible infection that could be related to his back feel like the back pain is more of a lumbar strain and that he has some rectal irritation from the rectal exam.  The patient was colonoscopy coming up next month, and I advised him to follow-up with them on any further issues with his hemorrhoids   Dalia Heading, PA-C 10/15/14 [place], MD 10/15/14 2023

## 2014-10-15 NOTE — ED Notes (Signed)
Pt complains of pain in rectum stating that he has been hurting since 1 week ago when he was given a digital exam for hemoriods by his primary MD.  Pt refuses an IV, blood work , or urinalysis.  Pt requests having a male MD to examine.  Md made aware.

## 2014-10-15 NOTE — ED Notes (Signed)
Pt sts still having back pain and issues with hemorrhoids

## 2014-10-15 NOTE — Discharge Instructions (Signed)
Return here as needed.  Follow-up with your primary care doctor °

## 2014-10-20 ENCOUNTER — Ambulatory Visit (INDEPENDENT_AMBULATORY_CARE_PROVIDER_SITE_OTHER): Payer: BC Managed Care – PPO | Admitting: Clinical

## 2014-10-20 ENCOUNTER — Encounter (HOSPITAL_COMMUNITY): Payer: Self-pay | Admitting: Clinical

## 2014-10-20 DIAGNOSIS — F329 Major depressive disorder, single episode, unspecified: Secondary | ICD-10-CM | POA: Diagnosis not present

## 2014-10-20 DIAGNOSIS — F32A Depression, unspecified: Secondary | ICD-10-CM

## 2014-10-20 DIAGNOSIS — F411 Generalized anxiety disorder: Secondary | ICD-10-CM | POA: Diagnosis not present

## 2014-10-20 NOTE — Progress Notes (Signed)
Patient:   Roger [name]   DOB:   18-Jul-1955  MR Number:  [id number]  Location:  [facility] [id number] [place] [address] Dept: [phone]           Date of Service:   10/20/2014  Start Time:   10:01 End Time:   11:03  Provider/Observer:  Jerel Shepherd Counselor       Billing Code/Service: [phone]  Behavioral Observation: Azucena Kuba  presents as a 59 y.o.-year-old Caucasian Male who appeared his stated age. his dress was Appropriate and he was Casual and his manners were Appropriate to the situation.  There were not any physical disabilities noted.  he displayed an appropriate level of cooperation and motivation.    Interactions:    Active   Attention:   normal  Memory:   normal  Speech (Volume):  normal  Speech:   normal pitch and normal volume  Thought Process:  Coherent and Relevant   Though Content:  WNL  - anxious concern  Orientation:   person, place and situation  Judgment:   Fair  Planning:   Fair  Affect:    Anxious  Mood:    Anxious  Insight:   Fair  Intelligence:   normal  Chief Complaint:     Chief Complaint  Patient presents with  . Anxiety  . Panic Attack  . Depression    Reason for Service:  Referred by Dr. Adele Schilder  Current Symptoms:  Anxiety, panic attacks, depresssion  Source of Distress:              "A month ago my brother was diagnosed with stage 4 colon cancer. 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 It was a 2 parent home with 6 siblings. 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 Mama is the greatest that mother that ever walked the earth." "We didn't have much, but we had each other." "We didn't study nothing but playing ball and working. That is what we did." "I lived with them until I was 58 and was married. It seemed like my marriages, well - i would get to fill a void." "My marriages ended because we just never did see eye to eye on things."  I have one daughter Roger [name] (37) from my first marriage and she just re-enter my life. She is having a hard time though because her mama told her a bunch of lies."   Live in the [place]. He lives with his sister Inez Catalina. "She lost her husband in 2010. We get along so-so. I love her to death and we get along like you expect a brother and sister would."   Natural/Informal Support:  Basically just me, and my sister   Substance Use:  No concerns of substance abuse are reported.    Medical History:   Past Medical History  Diagnosis Date  . Anxiety   . Hypertension   . Depression   . Anxiety   . COPD (chronic obstructive pulmonary disease)   . Asthma           Medication List       This list is accurate as of: 10/20/14 10:14 AM.  Always use your most recent med list.               ALPRAZolam 1 MG tablet  Commonly known as:  XANAX  Take 1 tablet (1 mg total) by mouth 4 (four) times daily. May take 1/2 tablet extra per day if needed     aspirin EC 325 MG tablet  Take 325 mg by mouth daily.     atenolol 50 MG tablet  Commonly known as:  TENORMIN  Take 50 mg by mouth every morning.     Cetirizine HCl 10 MG Caps  Commonly known as:  ZYRTEC ALLERGY  Take 1 capsule (10 mg total) by mouth daily.     docusate sodium 100 MG capsule  Commonly known as:  COLACE  Take 1 capsule (100 mg total) by mouth every 12 (twelve) hours.      fluticasone 50 MCG/ACT nasal spray  Commonly known as:  FLONASE  Place 2 sprays into both nostrils daily.     HYDROcodone-acetaminophen 5-325 MG per tablet  Commonly known as:  NORCO/VICODIN  Take 1 tablet by mouth every 6 (six) hours as needed for moderate pain.     hydrocortisone-pramoxine 2.5-1 % rectal cream  Commonly known as:  ANALPRAM HC  Place 1 application rectally 3 (three) times daily.     ibuprofen 800 MG tablet  Commonly known as:  ADVIL,MOTRIN  Take 1 tablet (800 mg total) by mouth every 8 (eight) hours as needed.     mirtazapine 15 MG tablet  Commonly known as:  REMERON  Take 1 tablet (15 mg total) by mouth at bedtime.     naproxen 500 MG tablet  Commonly known as:  NAPROSYN  Take 1 tablet (500 mg total) by mouth 2 (two) times daily.     polyethylene glycol powder powder  Commonly known as:  GLYCOLAX/MIRALAX  - Take 17 g by mouth 2 (two) times daily. Until daily soft stools  -   - OTC              Sexual History:   History  Sexual Activity  . Sexual Activity: Not Currently     Abuse/Trauma History: Brother went  to the [place] War - came home  Psychiatric History:  Inpatient about 2001 - stayed for about 48 hours -  Anxiety    Strengths:   "I'm a very kind person, and I like to do things and do them right. I am also good at memorizing scripture."    Recovery Goals:  "Feel love again, happiness again, an these things I am missing out on."  Hobbies/Interests:               "Fishing, all sports, umping and refereeing"   Challenges/Barriers:   "Getting rid of this anxiety and being what I call normal"    Family Med/Psych History:  Family History  Problem Relation Age of Onset  . Anxiety disorder Mother   . Depression Mother   . Lung disease  Mother     "arthritic lung"  . Anxiety disorder Sister   . Depression Sister   . Anxiety disorder Brother   . Lung disease Father   . Anxiety disorder Sister   . Anxiety disorder Brother   .  Anxiety disorder Brother   . Anxiety disorder Brother     Risk of Suicide/Violence: low denies any current or past suicidal ideation  History of Suicide/Violence:  N/A  Psychosis:   N/A  Diagnosis:    Generalized anxiety disorder  Depression (emotion)  Impression/DX: [name] is a 59 y.o.-year-old, divorced Caucasian Male who presents with Generalized Anxiety Disorder with panic attacks. Azarius reports that he first started experiencing anxiety when he was 51 and was first diagosed 59 years old.  Marlyn reports the following anxiety symptoms: sleeps max of 5-7 hours per night,  worry all the time, irritability, inability to control the worry, lots of anxiety leading up to being in public or leaving the house, but able to be there after arriving, sometimes (panic attacks) fear of dying or of "going crazy".  He also reports some depression; feeling sad, loss of interest in things the last few months, loss of energy . Eulice's brother was diagnosed with stage 4 cancer recently and he has been very concerned.  Rule out for Major Depressive Disorder  Recommendation/Plan: Individual therapy 1x a week to become less frequent as symptoms decrease, and follow safety plan as needed  .

## 2014-11-04 ENCOUNTER — Ambulatory Visit (AMBULATORY_SURGERY_CENTER): Payer: Self-pay

## 2014-11-04 VITALS — Ht 71.0 in | Wt 186.8 lb

## 2014-11-04 DIAGNOSIS — Z800 Family history of malignant neoplasm of digestive organs: Secondary | ICD-10-CM

## 2014-11-04 NOTE — Progress Notes (Signed)
Per pt, no allergies to soy or egg products.Pt not taking any weight loss meds or using  O2 at home. 

## 2014-11-06 ENCOUNTER — Encounter: Payer: Self-pay | Admitting: Internal Medicine

## 2014-11-10 ENCOUNTER — Telehealth: Payer: Self-pay | Admitting: Internal Medicine

## 2014-11-10 NOTE — Telephone Encounter (Signed)
Sister stated pt now at work but wanted to make sure he could take his Xanax the morning of the procedure.  RN gave OK.  Pt plans to call again in the morning.

## 2014-11-11 ENCOUNTER — Telehealth: Payer: Self-pay | Admitting: Internal Medicine

## 2014-11-11 NOTE — Telephone Encounter (Signed)
Patient wants to be sure he can take Xanax and his blood pressure medication prior to procedure.  Patient told this is ok.

## 2014-11-12 ENCOUNTER — Other Ambulatory Visit: Payer: Self-pay | Admitting: Internal Medicine

## 2014-11-12 ENCOUNTER — Encounter: Payer: Self-pay | Admitting: Internal Medicine

## 2014-11-12 ENCOUNTER — Ambulatory Visit (AMBULATORY_SURGERY_CENTER): Payer: BC Managed Care – PPO | Admitting: Internal Medicine

## 2014-11-12 ENCOUNTER — Telehealth: Payer: Self-pay | Admitting: Internal Medicine

## 2014-11-12 VITALS — BP 129/66 | HR 46 | Temp 96.7°F | Resp 14 | Ht 71.0 in | Wt 186.0 lb

## 2014-11-12 DIAGNOSIS — D124 Benign neoplasm of descending colon: Secondary | ICD-10-CM

## 2014-11-12 DIAGNOSIS — Z1211 Encounter for screening for malignant neoplasm of colon: Secondary | ICD-10-CM

## 2014-11-12 DIAGNOSIS — Z800 Family history of malignant neoplasm of digestive organs: Secondary | ICD-10-CM

## 2014-11-12 DIAGNOSIS — K635 Polyp of colon: Secondary | ICD-10-CM | POA: Diagnosis not present

## 2014-11-12 DIAGNOSIS — Z86010 Personal history of colonic polyps: Secondary | ICD-10-CM

## 2014-11-12 MED ORDER — SODIUM CHLORIDE 0.9 % IV SOLN: 500.0000 mL | INTRAVENOUS | Status: DC

## 2014-11-12 NOTE — Progress Notes (Signed)
A/ox3, pleased with MAC, report to RN 

## 2014-11-12 NOTE — Patient Instructions (Signed)
Discharge instructions given. Handouts on polyps and diverticulosis. Hold aspirin or anything containing aspirin for 2 weeks. Resume previous medications. YOU HAD AN ENDOSCOPIC PROCEDURE TODAY AT [facility]:   Refer to the procedure report that was given to you for any specific questions about what was found during the examination.  If the procedure report does not answer your questions, please call your gastroenterologist to clarify.  If you requested that your care partner not be given the details of your procedure findings, then the procedure report has been included in a sealed envelope for you to review at your convenience later.  YOU SHOULD EXPECT: Some feelings of bloating in the abdomen. Passage of more gas than usual.  Walking can help get rid of the air that was put into your GI tract during the procedure and reduce the bloating. If you had a lower endoscopy (such as a colonoscopy or flexible sigmoidoscopy) you may notice spotting of blood in your stool or on the toilet paper. If you underwent a bowel prep for your procedure, you may not have a normal bowel movement for a few days.  Please Note:  You might notice some irritation and congestion in your nose or some drainage.  This is from the oxygen used during your procedure.  There is no need for concern and it should clear up in a day or so.  SYMPTOMS TO REPORT IMMEDIATELY:   Following lower endoscopy (colonoscopy or flexible sigmoidoscopy):  Excessive amounts of blood in the stool  Significant tenderness or worsening of abdominal pains  Swelling of the abdomen that is new, acute  Fever of 100F or higher  For urgent or emergent issues, a gastroenterologist can be reached at any hour by calling [phone].   DIET: Your first meal following the procedure should be a small meal and then it is ok to progress to your normal diet. Heavy or fried foods are harder to digest and may make you feel nauseous or bloated.   Likewise, meals heavy in dairy and vegetables can increase bloating.  Drink plenty of fluids but you should avoid alcoholic beverages for 24 hours.  ACTIVITY:  You should plan to take it easy for the rest of today and you should NOT DRIVE or use heavy machinery until tomorrow (because of the sedation medicines used during the test).    FOLLOW UP: Our staff will call the number listed on your records the next business day following your procedure to check on you and address any questions or concerns that you may have regarding the information given to you following your procedure. If we do not reach you, we will leave a message.  However, if you are feeling well and you are not experiencing any problems, there is no need to return our call.  We will assume that you have returned to your regular daily activities without incident.  If any biopsies were taken you will be contacted by phone or by letter within the next 1-3 weeks.  Please call us at [phone] if you have not heard about the biopsies in 3 weeks.    SIGNATURES/CONFIDENTIALITY: You and/or your care partner have signed paperwork which will be entered into your electronic medical record.  These signatures attest to the fact that that the information above on your After Visit Summary has been reviewed and is understood.  Full responsibility of the confidentiality of this discharge information lies with you and/or your care-partner.

## 2014-11-12 NOTE — Op Note (Signed)
[facility]  [address] [place], [id number]   COLONOSCOPY PROCEDURE REPORT  PATIENT: Roger, [name]  MR#: [id number] BIRTHDATE: 1956-04-27 , 23  yrs. old GENDER: male ENDOSCOPIST: Lafayette Dragon, MD REFERRED OE:VOJJK Dorthy Cooler, M.D. PROCEDURE DATE:  11/12/2014 PROCEDURE:   Colonoscopy, screening and Colonoscopy with snare polypectomy First Screening Colonoscopy - Avg.  risk and is 50 yrs.  old or older Yes.  Prior Negative Screening - Now for repeat screening. N/A  History of Adenoma - Now for follow-up colonoscopy & has been > or = to 3 yrs.  N/A  Polyps Removed Today ASA CLASS:   Class II INDICATIONS:Screening for colonic neoplasia and brother with colon cancer in the late 50s. MEDICATIONS: Monitored anesthesia care and Propofol 240 mg IV  DESCRIPTION OF PROCEDURE:   After the risks benefits and alternatives of the procedure were thoroughly explained, informed consent was obtained.  The digital rectal exam revealed no abnormalities of the rectum.   The LB [id number]  endoscope was introduced through the anus and advanced to the cecum, which was identified by both the appendix and ileocecal valve. No adverse events experienced.   The quality of the prep was good.  (MoviPrep was used)  The instrument was then slowly withdrawn as the colon was fully examined.      COLON FINDINGS: A firm sessile polyp measuring 10 mm in size was found in the descending colon.  A polypectomy was performed with a cold snare.  The resection was complete, the polyp tissue was completely retrieved and sent to histology.   There was mild diverticulosis noted in the descending colon.  Retroflexed views revealed no abnormalities. The time to cecum = 7.40 Withdrawal time = 8.55   The scope was withdrawn and the procedure completed. COMPLICATIONS: There were no immediate complications.  ENDOSCOPIC IMPRESSION: 1.   Sessile polyp was found in the descending colon; polypectomy was  performed with a cold snare 2.   Mild diverticulosis was noted in the descending colon  RECOMMENDATIONS: 1.  Await pathology results 2.  No aspirin or anti-inflammatory agents for 2 weeks recall colonoscopy pending path report High fiber diet  eSigned:  Lafayette Dragon, MD 11/12/2014 9:19 AM   cc:   [name], [name] MR#: [id number]

## 2014-11-12 NOTE — Progress Notes (Signed)
Called to room to assist during endoscopic procedure.  Patient ID and intended procedure confirmed with present staff. Received instructions for my participation in the procedure from the performing physician.  

## 2014-11-12 NOTE — Telephone Encounter (Signed)
Noted. Staff notified.

## 2014-11-13 ENCOUNTER — Telehealth: Payer: Self-pay | Admitting: *Deleted

## 2014-11-13 ENCOUNTER — Ambulatory Visit (HOSPITAL_COMMUNITY): Payer: Self-pay | Admitting: Clinical

## 2014-11-13 NOTE — Telephone Encounter (Signed)
  Follow up Call-  Call back number 11/12/2014  Post procedure Call Back phone  # [phone]  Permission to leave phone message Yes     Patient questions:  Do you have a fever, pain , or abdominal swelling? No. Pain Score  0 *  Have you tolerated food without any problems? Yes.    Have you been able to return to your normal activities? Yes.    Do you have any questions about your discharge instructions: Diet   No. Medications  No. Follow up visit  No.  Do you have questions or concerns about your Care? No.  Actions: * If pain score is 4 or above: No action needed, pain <4.

## 2014-11-19 ENCOUNTER — Encounter: Payer: Self-pay | Admitting: Internal Medicine

## 2014-12-04 ENCOUNTER — Ambulatory Visit (HOSPITAL_COMMUNITY): Payer: Self-pay | Admitting: Clinical

## 2014-12-16 ENCOUNTER — Encounter (HOSPITAL_COMMUNITY): Payer: Self-pay | Admitting: Psychiatry

## 2014-12-16 ENCOUNTER — Ambulatory Visit (INDEPENDENT_AMBULATORY_CARE_PROVIDER_SITE_OTHER): Payer: BC Managed Care – PPO | Admitting: Psychiatry

## 2014-12-16 VITALS — BP 126/82 | HR 67 | Ht 71.75 in | Wt 186.0 lb

## 2014-12-16 DIAGNOSIS — F411 Generalized anxiety disorder: Secondary | ICD-10-CM

## 2014-12-16 MED ORDER — ALPRAZOLAM 1 MG PO TABS: 1.0000 mg | ORAL_TABLET | Freq: Four times a day (QID) | ORAL | Status: DC

## 2014-12-16 MED ORDER — MIRTAZAPINE 15 MG PO TABS: 15.0000 mg | ORAL_TABLET | Freq: Every day | ORAL | Status: DC

## 2014-12-16 NOTE — Progress Notes (Signed)
[facility] [phone] Progress Note  Roger [name] [id number] 59 y.o.  12/16/2014 3:11 PM  Chief Complaint: Medication management and follow-up.      History of Present Illness: Roger [name] came for his followup appointment.  He is taking his medication as prescribed.  He had colonoscopy and he was relieved that everything was okay.  He is concerned about his another brother who found to be have a cancerous polyp but it was removed.  He has brother who has gastric cancer .  Patient overall doing better.  He is taking Xanax 1 mg 4 times a day and half as needed.  We did talk about reducing to 4 mg a day patient is very nervous and anxious cutting down the Xanax.  He seen [place] once but due to colonoscopy he could not come for the next appointment.  He promised that he will continue on sleep and therapy and we discussed gradually cutting down the Xanax.  He sleeping good.  He denies any agitation, anger, mood swing.  He denies any major panic attack.  However he admitted some time nervous and anxious around people.  His appetite is okay.  His vitals are stable.  He denies any tremors shakes or any other side effects.  He is working as a Sports coach in the school.    Suicidal Ideation: No Plan Formed: No Patient has means to carry out plan: No   Homicidal Ideation: No Plan Formed: No Patient has means to carry out plan: No  Review of Systems  Constitutional: Negative.   HENT: Negative.   Psychiatric/Behavioral: Negative for suicidal ideas and substance abuse. The patient is nervous/anxious.      Psychiatric: Agitation: No Hallucination: No Depressed Mood: No Insomnia: No Hypersomnia: No Altered Concentration: No Feels Worthless: No Grandiose Ideas: No Belief In Special Powers: No New/Increased Substance Abuse: No Compulsions: No  Neurologic: Headache: No Seizure: No Paresthesias: No  Past Medical Family, Social History:  Patient has hypertension and he see Dr. Laverle Patter at  [place].     Outpatient Encounter Prescriptions as of 12/16/2014  Medication Sig  . albuterol (PROVENTIL HFA;VENTOLIN HFA) 108 (90 BASE) MCG/ACT inhaler Inhale into the lungs every 6 (six) hours as needed for wheezing or shortness of breath.  . ALPRAZolam (XANAX) 1 MG tablet Take 1 tablet (1 mg total) by mouth 4 (four) times daily. May take 1/2 tablet extra per day if needed  . aspirin EC 325 MG tablet Take 325 mg by mouth daily.  Marland Kitchen atenolol (TENORMIN) 50 MG tablet Take 50 mg by mouth every morning.   . Cetirizine HCl (ZYRTEC ALLERGY) 10 MG CAPS Take 1 capsule (10 mg total) by mouth daily. (Patient not taking: Reported on 10/20/2014)  . hydrocortisone-pramoxine (ANALPRAM HC) 2.5-1 % rectal cream Place 1 application rectally 3 (three) times daily.  Marland Kitchen ibuprofen (ADVIL,MOTRIN) 800 MG tablet Take 1 tablet (800 mg total) by mouth every 8 (eight) hours as needed.  . mirtazapine (REMERON) 15 MG tablet Take 1 tablet (15 mg total) by mouth at bedtime.  . [DISCONTINUED] ALPRAZolam (XANAX) 1 MG tablet Take 1 tablet (1 mg total) by mouth 4 (four) times daily. May take 1/2 tablet extra per day if needed  . [DISCONTINUED] docusate sodium (COLACE) 100 MG capsule Take 1 capsule (100 mg total) by mouth every 12 (twelve) hours.  . [DISCONTINUED] fluticasone (FLONASE) 50 MCG/ACT nasal spray Place 2 sprays into both nostrils daily. (Patient taking differently: Place 2 sprays into both  nostrils as needed. )  . [DISCONTINUED] HYDROcodone-acetaminophen (NORCO/VICODIN) 5-325 MG per tablet Take 1 tablet by mouth every 6 (six) hours as needed for moderate pain. (Patient not taking: Reported on 10/20/2014)  . [DISCONTINUED] mirtazapine (REMERON) 15 MG tablet Take 1 tablet (15 mg total) by mouth at bedtime.   No facility-administered encounter medications on file as of 12/16/2014.    Past Psychiatric History/Hospitalization(s): Patient denies any inpatient psychiatric treatment.  He done an intensive  outpatient program in 2013.  At that time he has multiple losses in his life including his business and home.  He has taken amitriptyline in the past. Anxiety: Yes Bipolar Disorder: No Depression: Yes Mania: No Psychosis: No Schizophrenia: No Personality Disorder: No Hospitalization for psychiatric illness: No History of Electroconvulsive Shock Therapy: No Prior Suicide Attempts: No  Physical Exam: Constitutional:  BP 126/82 mmHg  Pulse 67  Ht 5' 11.75" (1.822 m)  Wt 186 lb (84.369 kg)  BMI 25.41 kg/m2  SpO2 95%  Recent Results (from the past 2160 hour(s))  Urinalysis, Routine w reflex microscopic     Status: None   Collection Time: 10/15/14  4:58 PM  Result Value Ref Range   Color, Urine YELLOW YELLOW   APPearance CLEAR CLEAR   Specific Gravity, Urine 1.009 1.005 - 1.030   pH 5.5 5.0 - 8.0   Glucose, UA NEGATIVE NEGATIVE mg/dL   Hgb urine dipstick NEGATIVE NEGATIVE   Bilirubin Urine NEGATIVE NEGATIVE   Ketones, ur NEGATIVE NEGATIVE mg/dL   Protein, ur NEGATIVE NEGATIVE mg/dL   Urobilinogen, UA 0.2 0.0 - 1.0 mg/dL   Nitrite NEGATIVE NEGATIVE   Leukocytes, UA NEGATIVE NEGATIVE    Comment: MICROSCOPIC NOT DONE ON URINES WITH NEGATIVE PROTEIN, BLOOD, LEUKOCYTES, NITRITE, OR GLUCOSE <1000 mg/dL.   General Appearance: well nourished  Musculoskeletal: Strength & Muscle Tone: within normal limits Gait & Station: normal Patient leans: N/A  Mental status examination: Patient is casually dressed and fairly groomed.  He appears anxious but cooperative.  He maintained good eye contact.  He described his mood nervous and his affect is mood appropriate.  His attention and concentration is fair.  His thought process logical and goal-directed.  His speech is fast but fluent and coherent.  There were no delusions, paranoia or any obsessive thoughts.  He denies any auditory or visual hallucination.  He denies any active or passive suicidal thoughts or homicidal thought.  His fund of  knowledge is good.  His psychomotor activity is okay.  There were no tremors, shakes or any EPS.  His cognition is good.  His insight judgment and impulse control is okay.  Established Problem, Stable/Improving (1), Review of Psycho-Social Stressors (1), Review of Last Therapy Session (1) and Review of Medication Regimen & Side Effects (2)  Assessment: Axis I: Generalized anxiety disorder  Axis II: Deferred  Axis III: See medical history  Plan:  Discuss medication and side effects.  Reassurance given.  Encouraged to see Joaquim Lai for coping skills.  We will authorize Xanax 1 mg 4 times a day and have at bedtime however we will started to cut down his Xanax in the future.  We discussed benzodiazepine dependence, tolerance and withdrawal.  Patient never ask for early refills for the benzodiazepine.  He is taking Remeron 15 mg at bedtime .  He does not want to increase his Remeron.  Recommended to call us back if he has any question or any concern.  Follow-up in 3 months.  ARFEEN,SYED T., MD 12/16/2014

## 2014-12-25 ENCOUNTER — Ambulatory Visit (HOSPITAL_COMMUNITY): Payer: Self-pay | Admitting: Clinical

## 2015-01-19 ENCOUNTER — Emergency Department (HOSPITAL_COMMUNITY)
Admission: EM | Admit: 2015-01-19 | Discharge: 2015-01-19 | Disposition: A | Discharge status: Home / Self Care | Payer: BC Managed Care – PPO | Attending: Emergency Medicine | Admitting: Emergency Medicine

## 2015-01-19 ENCOUNTER — Encounter (HOSPITAL_COMMUNITY): Payer: Self-pay | Admitting: *Deleted

## 2015-01-19 DIAGNOSIS — F329 Major depressive disorder, single episode, unspecified: Secondary | ICD-10-CM | POA: Insufficient documentation

## 2015-01-19 DIAGNOSIS — Z79899 Other long term (current) drug therapy: Secondary | ICD-10-CM | POA: Insufficient documentation

## 2015-01-19 DIAGNOSIS — F419 Anxiety disorder, unspecified: Secondary | ICD-10-CM | POA: Diagnosis not present

## 2015-01-19 DIAGNOSIS — Z720 Tobacco use: Secondary | ICD-10-CM | POA: Diagnosis not present

## 2015-01-19 DIAGNOSIS — J449 Chronic obstructive pulmonary disease, unspecified: Secondary | ICD-10-CM | POA: Insufficient documentation

## 2015-01-19 DIAGNOSIS — H6092 Unspecified otitis externa, left ear: Secondary | ICD-10-CM | POA: Insufficient documentation

## 2015-01-19 DIAGNOSIS — Z8701 Personal history of pneumonia (recurrent): Secondary | ICD-10-CM | POA: Diagnosis not present

## 2015-01-19 DIAGNOSIS — I10 Essential (primary) hypertension: Secondary | ICD-10-CM | POA: Diagnosis not present

## 2015-01-19 DIAGNOSIS — Z8719 Personal history of other diseases of the digestive system: Secondary | ICD-10-CM | POA: Diagnosis not present

## 2015-01-19 DIAGNOSIS — H578 Other specified disorders of eye and adnexa: Secondary | ICD-10-CM | POA: Insufficient documentation

## 2015-01-19 DIAGNOSIS — Z7982 Long term (current) use of aspirin: Secondary | ICD-10-CM | POA: Diagnosis not present

## 2015-01-19 DIAGNOSIS — H9202 Otalgia, left ear: Secondary | ICD-10-CM | POA: Diagnosis present

## 2015-01-19 MED ORDER — FLUTICASONE PROPIONATE 50 MCG/ACT NA SUSP: 2.0000 | Freq: Every day | NASAL | Status: DC

## 2015-01-19 MED ORDER — CIPROFLOXACIN-DEXAMETHASONE 0.3-0.1 % OT SUSP: 4.0000 [drp] | Freq: Two times a day (BID) | OTIC | Status: DC

## 2015-01-19 NOTE — Discharge Instructions (Signed)
ciprodex drops in left ear twice a day. flonase daily. Follow up with primary care doctor for recheck if symptoms not improving. Tylenol or motrin for pain.   Otitis Externa Otitis externa is a bacterial or fungal infection of the outer ear canal. This is the area from the eardrum to the outside of the ear. Otitis externa is sometimes called "swimmer's ear." CAUSES  Possible causes of infection include:  Swimming in dirty water.  Moisture remaining in the ear after swimming or bathing.  Mild injury (trauma) to the ear.  Objects stuck in the ear (foreign body).  Cuts or scrapes (abrasions) on the outside of the ear. SIGNS AND SYMPTOMS  The first symptom of infection is often itching in the ear canal. Later signs and symptoms may include swelling and redness of the ear canal, ear pain, and yellowish-white fluid (pus) coming from the ear. The ear pain may be worse when pulling on the earlobe. DIAGNOSIS  Your health care provider will perform a physical exam. A sample of fluid may be taken from the ear and examined for bacteria or fungi. TREATMENT  Antibiotic ear drops are often given for 10 to 14 days. Treatment may also include pain medicine or corticosteroids to reduce itching and swelling. HOME CARE INSTRUCTIONS   Apply antibiotic ear drops to the ear canal as prescribed by your health care provider.  Take medicines only as directed by your health care provider.  If you have diabetes, follow any additional treatment instructions from your health care provider.  Keep all follow-up visits as directed by your health care provider. PREVENTION   Keep your ear dry. Use the corner of a towel to absorb water out of the ear canal after swimming or bathing.  Avoid scratching or putting objects inside your ear. This can damage the ear canal or remove the protective wax that lines the canal. This makes it easier for bacteria and fungi to grow.  Avoid swimming in lakes, polluted water, or  poorly chlorinated pools.  You may use ear drops made of rubbing alcohol and vinegar after swimming. Combine equal parts of white vinegar and alcohol in a bottle. Put 3 or 4 drops into each ear after swimming. SEEK MEDICAL CARE IF:   You have a fever.  Your ear is still red, swollen, painful, or draining pus after 3 days.  Your redness, swelling, or pain gets worse.  You have a severe headache.  You have redness, swelling, pain, or tenderness in the area behind your ear. MAKE SURE YOU:   Understand these instructions.  Will watch your condition.  Will get help right away if you are not doing well or get worse. Document Released: 06/20/2005 Document Revised: 11/04/2013 Document Reviewed: 07/07/2011 [facility] Patient Information 2015 [place], [place]. This information is not intended to replace advice given to you by your health care provider. Make sure you discuss any questions you have with your health care provider.

## 2015-01-19 NOTE — ED Notes (Signed)
Pt reports earache since Saturday.

## 2015-01-19 NOTE — ED Notes (Signed)
Declined W/C at D/C and was escorted to lobby by RN. 

## 2015-01-19 NOTE — ED Provider Notes (Signed)
CSN: [id number]     Arrival date & time 01/19/15  3086 History  This chart was scribed for non-physician practitioner, Renold Genta, PA-C, working with Daleen Bo, MD by Ladene Artist, ED Scribe. This patient was seen in room TR10C/TR10C and the patient's care was started at 10:44 AM.   Chief Complaint  Patient presents with  . Otalgia   The history is provided by the patient. No language interpreter was used.   HPI Comments: Roger [name] is a 59 y.o. male, with a h/o HTN, who presents to the Emergency Department complaining of sudden onset of left ear pain for the past 2 days. Pt describes pain as a constant, "gnawing" pain that radiates to the back and front of his left ear. Pain is exacerbated with palpation and opening his mouth. He reports associated left ear discharge, left eye watering, rhinorrhea, congestion, sinus pressure, fatigue, subjective fever 2 days ago that has resolved. Pt denies sore throat and hearing loss.   Past Medical History  Diagnosis Date  . Anxiety   . Hypertension   . Depression   . COPD (chronic obstructive pulmonary disease)   . Asthma   . History of rectal bleeding   . Hemorrhoids   . Pneumonia     in 2015  . Post-operative nausea and vomiting    Past Surgical History  Procedure Laterality Date  . Tonsillectomy    . Dental surgery    . Nose surgery     Family History  Problem Relation Age of Onset  . Anxiety disorder Mother   . Depression Mother   . Lung disease Mother     "arthritic lung"  . Anxiety disorder Sister   . Depression Sister   . Anxiety disorder Brother   . Lung disease Father   . Lung cancer Father   . Anxiety disorder Sister   . Anxiety disorder Brother   . Anxiety disorder Brother   . Anxiety disorder Brother   . Colon cancer Brother    History  Substance Use Topics  . Smoking status: Current Every Day Smoker -- 0.50 packs/day for 25 years    Types: Cigarettes  . Smokeless tobacco: Never Used  . Alcohol  Use: No    Review of Systems  Constitutional: Positive for fatigue. Negative for fever.  HENT: Positive for congestion, ear discharge, ear pain, rhinorrhea and sinus pressure. Negative for hearing loss and sore throat.   Eyes: Positive for discharge (watering).   Allergies  Demerol; Erythromycin; and Sulfa antibiotics  Home Medications   Prior to Admission medications   Medication Sig Start Date End Date Taking? Authorizing Provider  albuterol (PROVENTIL HFA;VENTOLIN HFA) 108 (90 BASE) MCG/ACT inhaler Inhale into the lungs every 6 (six) hours as needed for wheezing or shortness of breath.    Historical Provider, MD  ALPRAZolam Duanne Moron) 1 MG tablet Take 1 tablet (1 mg total) by mouth 4 (four) times daily. May take 1/2 tablet extra per day if needed 12/16/14   Kathlee Nations, MD  aspirin EC 325 MG tablet Take 325 mg by mouth daily.    Historical Provider, MD  atenolol (TENORMIN) 50 MG tablet Take 50 mg by mouth every morning.     Historical Provider, MD  Cetirizine HCl (ZYRTEC ALLERGY) 10 MG CAPS Take 1 capsule (10 mg total) by mouth daily. Patient not taking: Reported on 10/20/2014 07/08/14   Jeannett Senior, PA-C  hydrocortisone-pramoxine (ANALPRAM HC) 2.5-1 % rectal cream Place 1 application rectally 3 (three)  times daily. 10/15/14   Dalia Heading, PA-C  ibuprofen (ADVIL,MOTRIN) 800 MG tablet Take 1 tablet (800 mg total) by mouth every 8 (eight) hours as needed. 10/15/14   Dalia Heading, PA-C  mirtazapine (REMERON) 15 MG tablet Take 1 tablet (15 mg total) by mouth at bedtime. 12/16/14   Kathlee Nations, MD   BP 134/69 mmHg  Pulse 62  Temp(Src) 97.5 F (36.4 C) (Oral)  Resp 16  SpO2 98% Physical Exam  Constitutional: He is oriented to person, place, and time. He appears well-developed and well-nourished. No distress.  HENT:  Head: Normocephalic and atraumatic.  Nose: Nose normal.  Mouth/Throat: Oropharynx is clear and moist. No oropharyngeal exudate.  Tender to palpation over  posterior left ear, tragus, external ear. Ear canal is edematous. TM normal.  Eyes: Conjunctivae and EOM are normal.  Neck: Neck supple. Carotid bruit is not present. No tracheal deviation present.  Cardiovascular: Normal rate, regular rhythm and normal heart sounds.   No murmur heard. Pulmonary/Chest: Effort normal and breath sounds normal. No respiratory distress. He has no wheezes. He has no rales.  Musculoskeletal: Normal range of motion.  Neurological: He is alert and oriented to person, place, and time.  Skin: Skin is warm and dry.  Psychiatric: He has a normal mood and affect. His behavior is normal.  Nursing note and vitals reviewed.  ED Course  Procedures (including critical care time) DIAGNOSTIC STUDIES: Oxygen Saturation is 98% on RA, normal by my interpretation.    COORDINATION OF CARE: 10:54 AM-Discussed treatment plan which includes ear drops and return precautions with pt at bedside and pt agreed to plan.   Labs Review Labs Reviewed - No data to display  Imaging Review No results found.   EKG Interpretation None      MDM   Final diagnoses:  Otitis externa, left    Patient with left ear pain, exam consistent with otitis externa. Patient also complaining of some sinus congestion and sinus pressure and left eye watering. Question cluster headache versus sinusitis. Will start on Flonase, Ciprodex for external ear infection, ibuprofen and Tylenol for pain, follow-up as needed.  Filed Vitals:   01/19/15 0910 01/19/15 1120 01/19/15 1121  BP: 134/69  124/60  Pulse: 62  50  Temp: 97.5 F (36.4 C)  98 F (36.7 C)  TempSrc: Oral  Oral  Resp: 16  16  Height:  '5\' 11"'$  (1.803 m)   Weight:  186 lb (84.369 kg)   SpO2: 98%  99%    I personally performed the services described in this documentation, which was scribed in my presence. The recorded information has been reviewed and is accurate.   Jeannett Senior, PA-C 01/19/15 [place], MD 01/19/15  [phone]

## 2015-01-26 ENCOUNTER — Ambulatory Visit (HOSPITAL_COMMUNITY): Payer: Self-pay | Admitting: Clinical

## 2015-02-16 ENCOUNTER — Ambulatory Visit (INDEPENDENT_AMBULATORY_CARE_PROVIDER_SITE_OTHER): Payer: BC Managed Care – PPO | Admitting: Clinical

## 2015-02-16 DIAGNOSIS — F411 Generalized anxiety disorder: Secondary | ICD-10-CM

## 2015-02-16 DIAGNOSIS — F329 Major depressive disorder, single episode, unspecified: Secondary | ICD-10-CM

## 2015-02-16 DIAGNOSIS — F32A Depression, unspecified: Secondary | ICD-10-CM

## 2015-02-22 ENCOUNTER — Encounter (HOSPITAL_COMMUNITY): Payer: Self-pay | Admitting: Clinical

## 2015-02-22 NOTE — Progress Notes (Signed)
   THERAPIST PROGRESS NOTE  Session Time: 3:30 - 4:25  Participation Level: Active  Behavioral Response: CasualAlertAnxious  Type of Therapy: Individual Therapy  Treatment Goals addressed: improve psychiatric symptoms  Interventions: Motivational Interviewing   Summary: Roger [name] is a 59 y.o. male who presents with Generalized Anxiety Disorder.   Suicidal/Homicidal: Nowithout intent/plan  Therapist Response: [name] met with clinician for an individual session. [name] discussed his current life events and his psychiatric symptoms. [name] shared that he experiences a lot of anxiety and some anxiety attacks. He shared he spends a lot of time concerned with the question "who am I am" and worrying about the meaning of life. He shared others tell him he is sane and he should not worry so much but he does. He asked clinician to reaffirm that he was sane. Clinician did and also let [name] know that there were skills to improve his anxiety. [name] shared about his inability to shut his thoughts off. Clinician discussed grounding and mindfulness techniques. Clinician explained that some of the techniques are used to interrupt thoughts while others are used to help him be more present. Client and clinician discussed the process, purpose and practice of the techniques. [name] and clinician practiced some of the techniques together. [name] agreed to practice some of the techniques daily until next session. Clinician discussed some basic cbt concepts. Client and clinician agreed to discuss how the skills could be applied to help him with his symptoms at a future session.  Plan: Return again in 1-2 weeks.  Diagnosis: Axis I: Generalized Anxiety Disorder      Martavion Couper A, LCSW 02/22/2015

## 2015-02-27 ENCOUNTER — Encounter (HOSPITAL_COMMUNITY): Payer: Self-pay | Admitting: *Deleted

## 2015-02-27 ENCOUNTER — Emergency Department (HOSPITAL_COMMUNITY)
Admission: EM | Admit: 2015-02-27 | Discharge: 2015-02-27 | Disposition: A | Discharge status: Home / Self Care | Payer: BC Managed Care – PPO | Attending: Emergency Medicine | Admitting: Emergency Medicine

## 2015-02-27 ENCOUNTER — Emergency Department (HOSPITAL_COMMUNITY): Payer: BC Managed Care – PPO

## 2015-02-27 DIAGNOSIS — J449 Chronic obstructive pulmonary disease, unspecified: Secondary | ICD-10-CM | POA: Diagnosis not present

## 2015-02-27 DIAGNOSIS — Z7982 Long term (current) use of aspirin: Secondary | ICD-10-CM | POA: Insufficient documentation

## 2015-02-27 DIAGNOSIS — Z720 Tobacco use: Secondary | ICD-10-CM | POA: Insufficient documentation

## 2015-02-27 DIAGNOSIS — I10 Essential (primary) hypertension: Secondary | ICD-10-CM | POA: Insufficient documentation

## 2015-02-27 DIAGNOSIS — R0981 Nasal congestion: Secondary | ICD-10-CM | POA: Diagnosis present

## 2015-02-27 DIAGNOSIS — F329 Major depressive disorder, single episode, unspecified: Secondary | ICD-10-CM | POA: Insufficient documentation

## 2015-02-27 DIAGNOSIS — Z8719 Personal history of other diseases of the digestive system: Secondary | ICD-10-CM | POA: Diagnosis not present

## 2015-02-27 DIAGNOSIS — F419 Anxiety disorder, unspecified: Secondary | ICD-10-CM | POA: Insufficient documentation

## 2015-02-27 DIAGNOSIS — J069 Acute upper respiratory infection, unspecified: Secondary | ICD-10-CM | POA: Insufficient documentation

## 2015-02-27 DIAGNOSIS — Z79899 Other long term (current) drug therapy: Secondary | ICD-10-CM | POA: Diagnosis not present

## 2015-02-27 NOTE — Discharge Instructions (Signed)
-   Rest over the weekend and increase fluids. Solid food as tolerated - Continue taking your allergy medicines - Return to ED with fevers, shortness of breath, or further worsening of symptoms

## 2015-02-27 NOTE — ED Notes (Signed)
Pt reports cough, congestion, productive cough with green sputum. Pt also reports a headache with this.

## 2015-02-27 NOTE — ED Provider Notes (Signed)
History  This chart was scribed for non-physician practitioner, Josephina Gip, PA-C,working with Forde Dandy, MD, by Marlowe Kays, ED Scribe. This patient was seen in room TR09C/TR09C and the patient's care was started at 4:45 PM.  Chief Complaint  Patient presents with  . URI  . Headache   The history is provided by the patient and medical records. No language interpreter was used.    HPI Comments:  [name] is a 59 y.o. male who presents to the Emergency Department complaining of congestion and productive cough of green mucous that began approximately one week ago. He reports associated mild throbbing headache, decrease in appetite, sore throat, watery eyes, generalized weakness/fatigue and generalized body aches. He states he gets these symptoms about 2-3 times a year and these are similar to what he has experienced before. He states he called his PCP and was instructed to take Mucinex and Flonase in which he reports taking as directed. He denies modifying factors. He denies fever, chills, nausea, vomiting, CP or SOB. He reports taking ASA 325 mg daily but denies anticoagulant therapy. He endorses smoking approximately 0.25 packs per day.  Past Medical History  Diagnosis Date  . Anxiety   . Hypertension   . Depression   . COPD (chronic obstructive pulmonary disease)   . Asthma   . History of rectal bleeding   . Hemorrhoids   . Pneumonia     in 2015  . Post-operative nausea and vomiting    Past Surgical History  Procedure Laterality Date  . Tonsillectomy    . Dental surgery    . Nose surgery     Family History  Problem Relation Age of Onset  . Anxiety disorder Mother   . Depression Mother   . Lung disease Mother     "arthritic lung"  . Anxiety disorder Sister   . Depression Sister   . Anxiety disorder Brother   . Lung disease Father   . Lung cancer Father   . Anxiety disorder Sister   . Anxiety disorder Brother   . Anxiety disorder Brother   . Anxiety  disorder Brother   . Colon cancer Brother    Social History  Substance Use Topics  . Smoking status: Current Every Day Smoker -- 0.50 packs/day for 25 years    Types: Cigarettes  . Smokeless tobacco: Never Used  . Alcohol Use: No    Review of Systems  Constitutional: Positive for appetite change and fatigue (generalized).  HENT: Positive for congestion and sore throat.   Eyes: Positive for discharge (watering).  Respiratory: Positive for cough. Negative for shortness of breath.   Cardiovascular: Negative for chest pain.  Gastrointestinal: Negative for nausea and vomiting.  Musculoskeletal: Positive for myalgias (generalized body aches).  Neurological: Positive for weakness (generalized) and headaches.    Allergies  Demerol; Erythromycin; and Sulfa antibiotics  Home Medications   Prior to Admission medications   Medication Sig Start Date End Date Taking? Authorizing Provider  albuterol (PROVENTIL HFA;VENTOLIN HFA) 108 (90 BASE) MCG/ACT inhaler Inhale into the lungs every 6 (six) hours as needed for wheezing or shortness of breath.   Yes Historical Provider, MD  ALPRAZolam Duanne Moron) 1 MG tablet Take 1 tablet (1 mg total) by mouth 4 (four) times daily. May take 1/2 tablet extra per day if needed 12/16/14  Yes Kathlee Nations, MD  aspirin EC 325 MG tablet Take 325 mg by mouth daily.   Yes Historical Provider, MD  atenolol (TENORMIN) 50 MG tablet  Take 50 mg by mouth every morning.    Yes Historical Provider, MD  fluticasone (FLONASE) 50 MCG/ACT nasal spray Place 2 sprays into both nostrils daily. Patient taking differently: Place 2 sprays into both nostrils daily as needed for allergies or rhinitis.  01/19/15  Yes Tatyana Kirichenko, PA-C  ibuprofen (ADVIL,MOTRIN) 200 MG tablet Take 200 mg by mouth every 6 (six) hours as needed for headache, mild pain or moderate pain.   Yes Historical Provider, MD  mirtazapine (REMERON) 15 MG tablet Take 1 tablet (15 mg total) by mouth at bedtime. 12/16/14   Yes Kathlee Nations, MD  Cetirizine HCl (ZYRTEC ALLERGY) 10 MG CAPS Take 1 capsule (10 mg total) by mouth daily. Patient not taking: Reported on 10/20/2014 07/08/14   Jeannett Senior, PA-C  ciprofloxacin-dexamethasone (CIPRODEX) otic suspension Place 4 drops into the left ear 2 (two) times daily. Patient not taking: Reported on 02/27/2015 01/19/15   Jeannett Senior, PA-C  hydrocortisone-pramoxine (ANALPRAM HC) 2.5-1 % rectal cream Place 1 application rectally 3 (three) times daily. Patient not taking: Reported on 02/27/2015 10/15/14   Dalia Heading, PA-C  ibuprofen (ADVIL,MOTRIN) 800 MG tablet Take 1 tablet (800 mg total) by mouth every 8 (eight) hours as needed. Patient not taking: Reported on 02/27/2015 10/15/14   Dalia Heading, PA-C   Triage Vitals: BP 130/69 mmHg  Pulse 62  Temp(Src) 98.1 F (36.7 C) (Oral)  Resp 18  SpO2 95% Physical Exam  Constitutional: He is oriented to person, place, and time. He appears well-developed and well-nourished. No distress.  HENT:  Head: Normocephalic and atraumatic.  Right Ear: Tympanic membrane and ear canal normal.  Left Ear: Tympanic membrane and ear canal normal.  Nose: Nose normal.  Mouth/Throat: Uvula is midline, oropharynx is clear and moist and mucous membranes are normal. No oropharyngeal exudate or posterior oropharyngeal erythema.  Eyes: Conjunctivae and EOM are normal. Pupils are equal, round, and reactive to light. Right eye exhibits no discharge. Left eye exhibits no discharge.  Neck: Normal range of motion. Normal carotid pulses present. Carotid bruit is not present.  Cardiovascular: Normal rate, regular rhythm and normal heart sounds.  Exam reveals no gallop and no friction rub.   No murmur heard. Pulmonary/Chest: Effort normal and breath sounds normal. No respiratory distress. He has no wheezes. He has no rales. He exhibits no tenderness.  Abdominal: Soft. He exhibits no distension. There is no tenderness.  Musculoskeletal:  Normal range of motion.  Neurological: He is alert and oriented to person, place, and time.  Strength 5/5 in all extremities.  Skin: Skin is warm and dry. He is not diaphoretic.  Psychiatric: He has a normal mood and affect. His behavior is normal.  Nursing note and vitals reviewed.   ED Course  Procedures (including critical care time) DIAGNOSTIC STUDIES: Oxygen Saturation is 95% on RA, adequate by my interpretation.   COORDINATION OF CARE: 4:56 PM- Informed pt of negative CXR and instructed him to rest, increase fluids and continue taking Flonase and Mucinex as directed. Informed pt he can take OTC Tylenol for HA and body aches. Pt verbalizes understanding and agrees to plan.  Medications - No data to display  Labs Review Labs Reviewed - No data to display  Imaging Review Dg Chest 2 View  02/27/2015   CLINICAL DATA:  Cough for 2 weeks. Weakness for 5 days. Congestion. Productive cough. Headache. History of COPD, smoking, hypertension.  EXAM: CHEST  2 VIEW  COMPARISON:  08/25/2014 and earlier  FINDINGS: Heart size is  normal. There is perihilar peribronchial thickening. There are no focal consolidations or pleural effusions. No pulmonary edema. Visualized osseous structures have a normal appearance.  IMPRESSION: Bronchitic changes.  No focal acute pulmonary abnormality.   Electronically Signed   By: Nolon Nations M.D.   On: 02/27/2015 16:43   I have personally reviewed and evaluated these images and lab results as part of my medical decision-making.   EKG Interpretation None      MDM   Final diagnoses:  URI (upper respiratory infection)   URI Pt presenting with 1 week of cough, headaches, congestion, body aches and general malaise. Pt called his PCP who told him to take flonase and mucinex which he reports compliance with but no great relief. CXR shows no acute pulmonary disease process. VSS and nontoxic appearing. No abnormalities on exam. Most likely URI, viral in etiology.  Advised patient to rest at home over the weekend and increase fluid intake. Discussed that this is a viral illness and would not benefit from antibiotic therapy. May take OTC tylenol or advil for headaches. Continue taking medications as directed by PCP. Return to ED with fevers, shortness of breath, chest pain or further worsening of symptoms.   I personally performed the services described in this documentation, which was scribed in my presence. The recorded information has been reviewed and is accurate.    Josephina Gip, PA-C 02/27/15 1729  Forde Dandy, MD 02/28/15 [phone]

## 2015-03-02 ENCOUNTER — Ambulatory Visit (HOSPITAL_COMMUNITY): Payer: Self-pay | Admitting: Clinical

## 2015-03-20 ENCOUNTER — Ambulatory Visit (INDEPENDENT_AMBULATORY_CARE_PROVIDER_SITE_OTHER): Payer: BC Managed Care – PPO | Admitting: Psychiatry

## 2015-03-20 ENCOUNTER — Encounter (HOSPITAL_COMMUNITY): Payer: Self-pay | Admitting: Psychiatry

## 2015-03-20 VITALS — BP 134/79 | HR 60 | Ht 71.0 in | Wt 182.0 lb

## 2015-03-20 DIAGNOSIS — F411 Generalized anxiety disorder: Secondary | ICD-10-CM | POA: Diagnosis not present

## 2015-03-20 MED ORDER — ALPRAZOLAM 1 MG PO TABS: ORAL_TABLET | ORAL | Status: DC

## 2015-03-20 MED ORDER — MIRTAZAPINE 30 MG PO TABS: 30.0000 mg | ORAL_TABLET | Freq: Every day | ORAL | Status: DC

## 2015-03-20 NOTE — Progress Notes (Signed)
[facility] [phone] Progress Note  TALIS IWAN [id number] 59 y.o.  03/20/2015 11:19 AM  Chief Complaint: Medication management and follow-up.      History of Present Illness: Roger [name] came for his followup appointment.  Recently he visited emergency room because of upper respiratory infection.  He was given and a chaotic and cough medicine which he finished.  He is taking Xanax 1 mg 4 times a day and half at bedtime.  We have been talking about reducing the Xanax in a while and every time he gets very anxious and nervous because he does not want to reduce the dose.  He is not seeing any therapist at this time.  He sleeping good.  But lately due to his physical condition he felt somewhat weak, tired and loss appetite and weight.  He denies any agitation, anger, mood swing, suicidal thoughts or homicidal thought.  He denies any major panic attack.  He still feels sometimes anxious around people but denies any aggressive behavior.  He likes his job.  He is working as a Sports coach in the school.  He denies drinking or using any illegal substances.  He has no tremors or shakes.  He likes the Remeron which is given to help his anxiety and sleep.  Suicidal Ideation: No Plan Formed: No Patient has means to carry out plan: No   Homicidal Ideation: No Plan Formed: No Patient has means to carry out plan: No  Review of Systems  Constitutional: Positive for weight loss and malaise/fatigue.  HENT: Negative.   Cardiovascular: Negative for chest pain and palpitations.  Gastrointestinal: Positive for nausea.  Skin: Negative for itching and rash.  Neurological: Negative for dizziness, tingling and tremors.  Psychiatric/Behavioral: Negative for suicidal ideas and substance abuse. The patient is nervous/anxious.    Psychiatric: Agitation: No Hallucination: No Depressed Mood: No Insomnia: No Hypersomnia: No Altered Concentration: No Feels Worthless: No Grandiose Ideas: No Belief In Special  Powers: No New/Increased Substance Abuse: No Compulsions: No  Neurologic: Headache: No Seizure: No Paresthesias: No  Past Medical Family, Social History:  Patient has hypertension and he see Dr. Laverle Patter at [place] [facility].     Outpatient Encounter Prescriptions as of 03/20/2015  Medication Sig  . albuterol (PROVENTIL HFA;VENTOLIN HFA) 108 (90 BASE) MCG/ACT inhaler Inhale into the lungs every 6 (six) hours as needed for wheezing or shortness of breath.  . ALPRAZolam (XANAX) 1 MG tablet Take 1 tab 3 times and day and 1/2 at bed time  . aspirin EC 325 MG tablet Take 325 mg by mouth daily.  Marland Kitchen atenolol (TENORMIN) 50 MG tablet Take 50 mg by mouth every morning.   . Cetirizine HCl (ZYRTEC ALLERGY) 10 MG CAPS Take 1 capsule (10 mg total) by mouth daily. (Patient not taking: Reported on 10/20/2014)  . ibuprofen (ADVIL,MOTRIN) 200 MG tablet Take 200 mg by mouth every 6 (six) hours as needed for headache, mild pain or moderate pain.  . mirtazapine (REMERON) 30 MG tablet Take 1 tablet (30 mg total) by mouth at bedtime.  . [DISCONTINUED] ALPRAZolam (XANAX) 1 MG tablet Take 1 tablet (1 mg total) by mouth 4 (four) times daily. May take 1/2 tablet extra per day if needed  . [DISCONTINUED] ciprofloxacin-dexamethasone (CIPRODEX) otic suspension Place 4 drops into the left ear 2 (two) times daily. (Patient not taking: Reported on 02/27/2015)  . [DISCONTINUED] fluticasone (FLONASE) 50 MCG/ACT nasal spray Place 2 sprays into both nostrils daily. (Patient taking differently: Place 2 sprays into  both nostrils daily as needed for allergies or rhinitis. )  . [DISCONTINUED] hydrocortisone-pramoxine (ANALPRAM HC) 2.5-1 % rectal cream Place 1 application rectally 3 (three) times daily. (Patient not taking: Reported on 02/27/2015)  . [DISCONTINUED] ibuprofen (ADVIL,MOTRIN) 800 MG tablet Take 1 tablet (800 mg total) by mouth every 8 (eight) hours as needed. (Patient not taking: Reported on 02/27/2015)  .  [DISCONTINUED] mirtazapine (REMERON) 15 MG tablet Take 1 tablet (15 mg total) by mouth at bedtime.   No facility-administered encounter medications on file as of 03/20/2015.    Past Psychiatric History/Hospitalization(s): Patient denies any inpatient psychiatric treatment.  He done an intensive outpatient program in 2013.  At that time he has multiple losses in his life including his business and home.  He has taken amitriptyline in the past. Anxiety: Yes Bipolar Disorder: No Depression: Yes Mania: No Psychosis: No Schizophrenia: No Personality Disorder: No Hospitalization for psychiatric illness: No History of Electroconvulsive Shock Therapy: No Prior Suicide Attempts: No  Physical Exam: Constitutional:  BP 134/79 mmHg  Pulse 60  Ht '5\' 11"'$  (1.803 m)  Wt 182 lb (82.555 kg)  BMI 25.40 kg/m2  No results found for this or any previous visit (from the past 2160 hour(s)). General Appearance: alert, oriented, no acute distress  Musculoskeletal: Strength & Muscle Tone: within normal limits Gait & Station: normal Patient leans: N/A  Mental status examination: Patient is casually dressed and fairly groomed.  He appears anxious but cooperative.  He maintained good eye contact.  He described his mood nervous and his affect is mood appropriate.  His attention and concentration is fair.  His thought process logical and goal-directed.  His speech is fast but fluent and coherent.  There were no delusions, paranoia or any obsessive thoughts.  He denies any auditory or visual hallucination.  He denies any active or passive suicidal thoughts or homicidal thought.  His fund of knowledge is good.  His psychomotor activity is okay.  There were no tremors, shakes or any EPS.  His cognition is good.  His insight judgment and impulse control is okay.  Established Problem, Stable/Improving (1), Review of Psycho-Social Stressors (1), Review or order clinical lab tests (1), Review and summation of old  records (2), Review of Last Therapy Session (1), Review of Medication Regimen & Side Effects (2) and Review of New Medication or Change in Dosage (2)  Assessment: Axis I: Generalized anxiety disorder  Axis II: Deferred  Axis III: See medical history  Plan:  I review his notes from emergency room.  He is no longer taking ABX and his symptoms are improved other than he has mild nausea.  He lost some weight and he is anxious.  I had a long discussion about his benzodiazepine.  He's been taking a moderate dose of Xanax and I explained that he did try a higher dose of Remeron to help her anxiety , depression and may help his appetite.  We will cut down his Xanax 1 mg 3 times a day and half at bedtime.  Discussed medication side effects especially benzodiazepine dependence, tolerance and withdrawal.  Recommended to call us back if he has any question or any concerns.  Follow-up in 3 months.  Discuss safety plan that anytime having active suicidal thoughts or homicidal thoughts then he need to call 911 or go to the local emergency room.  Lorry Anastasi T., MD 03/20/2015

## 2015-03-25 ENCOUNTER — Ambulatory Visit (HOSPITAL_COMMUNITY): Payer: Self-pay | Admitting: Clinical

## 2015-04-13 ENCOUNTER — Emergency Department (HOSPITAL_COMMUNITY): Payer: BC Managed Care – PPO

## 2015-04-13 ENCOUNTER — Emergency Department (HOSPITAL_COMMUNITY)
Admission: EM | Admit: 2015-04-13 | Discharge: 2015-04-13 | Disposition: A | Discharge status: Home / Self Care | Payer: BC Managed Care – PPO | Attending: Emergency Medicine | Admitting: Emergency Medicine

## 2015-04-13 ENCOUNTER — Encounter (HOSPITAL_COMMUNITY): Payer: Self-pay | Admitting: *Deleted

## 2015-04-13 DIAGNOSIS — Z8701 Personal history of pneumonia (recurrent): Secondary | ICD-10-CM | POA: Diagnosis not present

## 2015-04-13 DIAGNOSIS — I10 Essential (primary) hypertension: Secondary | ICD-10-CM | POA: Diagnosis not present

## 2015-04-13 DIAGNOSIS — R042 Hemoptysis: Secondary | ICD-10-CM | POA: Insufficient documentation

## 2015-04-13 DIAGNOSIS — F329 Major depressive disorder, single episode, unspecified: Secondary | ICD-10-CM | POA: Insufficient documentation

## 2015-04-13 DIAGNOSIS — J449 Chronic obstructive pulmonary disease, unspecified: Secondary | ICD-10-CM | POA: Insufficient documentation

## 2015-04-13 DIAGNOSIS — Z720 Tobacco use: Secondary | ICD-10-CM | POA: Insufficient documentation

## 2015-04-13 DIAGNOSIS — J0110 Acute frontal sinusitis, unspecified: Secondary | ICD-10-CM | POA: Insufficient documentation

## 2015-04-13 DIAGNOSIS — Z7982 Long term (current) use of aspirin: Secondary | ICD-10-CM | POA: Diagnosis not present

## 2015-04-13 DIAGNOSIS — Z79899 Other long term (current) drug therapy: Secondary | ICD-10-CM | POA: Insufficient documentation

## 2015-04-13 DIAGNOSIS — R059 Cough, unspecified: Secondary | ICD-10-CM

## 2015-04-13 DIAGNOSIS — R05 Cough: Secondary | ICD-10-CM | POA: Diagnosis present

## 2015-04-13 DIAGNOSIS — F419 Anxiety disorder, unspecified: Secondary | ICD-10-CM | POA: Insufficient documentation

## 2015-04-13 MED ORDER — AMOXICILLIN-POT CLAVULANATE 875-125 MG PO TABS: 1.0000 | ORAL_TABLET | Freq: Two times a day (BID) | ORAL | Status: DC

## 2015-04-13 MED ORDER — ALBUTEROL SULFATE HFA 108 (90 BASE) MCG/ACT IN AERS: 1.0000 | INHALATION_SPRAY | Freq: Four times a day (QID) | RESPIRATORY_TRACT | Status: DC | PRN

## 2015-04-13 NOTE — ED Provider Notes (Signed)
CSN: [id number]     Arrival date & time 04/13/15  1219 History   First MD Initiated Contact with Patient 04/13/15 1356     Chief Complaint  Patient presents with  . Cough  . Hemoptysis    HPI   59 year old male presents today with upper respiratory complaints. Patient reports over the last 2 weeks he's had sinus congestion, rhinorrhea, with cough. Patient reports the cough has been persistent, minimally productive. He reports that over the last 2 days he noticed red streaking in his sputum. He reports it as intermittent and minimal. He reports a history of the same approx. 1 year ago that resolved without complications.  Pt denies SOB, difficulty breathing, difficulty swallowing, change in voice, sore throat, drooling, neck pain, chest pain, weight loss, fever, N/V/D, abdominal pain. Pt has a history of smoking. Pt has been using flonase at home.   Past Medical History  Diagnosis Date  . Anxiety   . Hypertension   . Depression   . COPD (chronic obstructive pulmonary disease) ([place])   . Asthma   . History of rectal bleeding   . Hemorrhoids   . Pneumonia     in 2015  . Post-operative nausea and vomiting    Past Surgical History  Procedure Laterality Date  . Tonsillectomy    . Dental surgery    . Nose surgery     Family History  Problem Relation Age of Onset  . Anxiety disorder Mother   . Depression Mother   . Lung disease Mother     "arthritic lung"  . Anxiety disorder Sister   . Depression Sister   . Anxiety disorder Brother   . Lung disease Father   . Lung cancer Father   . Anxiety disorder Sister   . Anxiety disorder Brother   . Anxiety disorder Brother   . Anxiety disorder Brother   . Colon cancer Brother    Social History  Substance Use Topics  . Smoking status: Current Every Day Smoker -- 0.50 packs/day for 25 years    Types: Cigarettes  . Smokeless tobacco: Never Used  . Alcohol Use: No    Review of Systems  All other systems reviewed and are  negative.   Allergies  Demerol; Erythromycin; Sulfa antibiotics; and Prednisone  Home Medications   Prior to Admission medications   Medication Sig Start Date End Date Taking? Authorizing Provider  ALPRAZolam Duanne Moron) 1 MG tablet Take 1 tab 3 times and day and 1/2 at bed time 03/20/15  Yes Kathlee Nations, MD  aspirin EC 325 MG tablet Take 325 mg by mouth daily.   Yes Historical Provider, MD  atenolol (TENORMIN) 50 MG tablet Take 50 mg by mouth every morning.    Yes Historical Provider, MD  ibuprofen (ADVIL,MOTRIN) 200 MG tablet Take 200 mg by mouth every 6 (six) hours as needed for headache, mild pain or moderate pain.   Yes Historical Provider, MD  mirtazapine (REMERON) 15 MG tablet Take 15 mg by mouth at bedtime.   Yes Historical Provider, MD  albuterol (PROVENTIL HFA;VENTOLIN HFA) 108 (90 BASE) MCG/ACT inhaler Inhale 1-2 puffs into the lungs every 6 (six) hours as needed for wheezing or shortness of breath. 04/13/15   Okey Regal, PA-C  amoxicillin-clavulanate (AUGMENTIN) 875-125 MG tablet Take 1 tablet by mouth every 12 (twelve) hours. 04/13/15   Okey Regal, PA-C  Cetirizine HCl (ZYRTEC ALLERGY) 10 MG CAPS Take 1 capsule (10 mg total) by mouth daily. Patient not taking: Reported on  10/20/2014 07/08/14   Tatyana Kirichenko, PA-C   BP 112/69 mmHg  Pulse 50  Temp(Src) 97.7 F (36.5 C) (Oral)  Resp 18  Ht '5\' 11"'$  (1.803 m)  Wt 187 lb (84.823 kg)  BMI 26.09 kg/m2  SpO2 95%   Physical Exam  Constitutional: He is oriented to person, place, and time. He appears well-developed and well-nourished.  HENT:  Head: Normocephalic and atraumatic.  Right Ear: Hearing, tympanic membrane, external ear and ear canal normal.  Left Ear: Hearing, tympanic membrane, external ear and ear canal normal.  Nose: Rhinorrhea present. No mucosal edema.  Mouth/Throat: Uvula is midline, oropharynx is clear and moist and mucous membranes are normal. No oropharyngeal exudate, posterior oropharyngeal edema,  posterior oropharyngeal erythema or tonsillar abscesses.  Eyes: Conjunctivae are normal. Pupils are equal, round, and reactive to light. Right eye exhibits no discharge. Left eye exhibits no discharge. No scleral icterus.  Neck: Normal range of motion. No JVD present. No tracheal deviation present.  Cardiovascular: Normal rate, regular rhythm, normal heart sounds and intact distal pulses.  Exam reveals no gallop and no friction rub.   No murmur heard. Pulmonary/Chest: Effort normal and breath sounds normal. No stridor. No respiratory distress. He has no wheezes. He has no rales. He exhibits no tenderness.  Musculoskeletal: Normal range of motion. He exhibits no edema or tenderness.  Neurological: He is alert and oriented to person, place, and time. Coordination normal.  Skin: Skin is warm and dry.  Psychiatric: He has a normal mood and affect. His behavior is normal. Judgment and thought content normal.  Nursing note and vitals reviewed.     ED Course  Procedures (including critical care time) Labs Review Labs Reviewed - No data to display  Imaging Review Dg Chest 2 View  04/13/2015   CLINICAL DATA:  Coughing  EXAM: CHEST  2 VIEW  COMPARISON:  02/27/2015  FINDINGS: Cardiomediastinal silhouette is stable. No acute infiltrate or pleural effusion. No pulmonary edema. Mild bronchitic changes again noted. Bony thorax is stable.  IMPRESSION: No acute infiltrate or pulmonary edema. Mild bronchitic changes again noted.   Electronically Signed   By: Lahoma Crocker M.D.   On: 04/13/2015 13:14   I have personally reviewed and evaluated these images and lab results as part of my medical decision-making.   EKG Interpretation None      MDM   Final diagnoses:  Cough  Hemoptysis  Acute frontal sinusitis, recurrence not specified    Labs:   Imaging: Dg chest- mild bronchitic changes   Consults:  Therapeutics:  Discharge Meds: Augmentin   Assessment/Plan: Pt presentation likely represents  bronchitis. Pt with recent chest x ray in august, CT angio chest PE in September 2015, February 2015. Pt was extremely anxious and continued to repeat questions confirming my diagnoses, attempting me to rate his heart and pulmonary function on 1-10 scales. Pt does have a history of smoking but due to onset of upper respiratory symptoms, no red flags for carcinoma, similar previous presentation with normal workup, it is reasonable to have patient use antibiotics for likely sinusitis, and follow up with his PCP in 3 days for re-evaluation. If patient is still having symptoms at that time CT chest may be needed. Options were discussed with patient who agreed to follow-up with PCP for further evaluation, return precautions given.          Okey Regal, PA-C 04/14/15 8657  Debby Freiberg, MD 04/18/15 [phone]

## 2015-04-13 NOTE — ED Notes (Addendum)
Pt reports having a cough x 1 week, started coughing up blood tinged sputum on Saturday. Denies fever/chills, weight loss. Mask on pt at triage.

## 2015-04-13 NOTE — Discharge Instructions (Signed)
Please use medication as directed, please contact her primary care provider and schedule follow-up visit in 2 days for reevaluation. If symptoms continue to persist, follow-up immediately, return to the emergency room if new or worsening signs or symptoms present.

## 2015-05-26 ENCOUNTER — Emergency Department (HOSPITAL_COMMUNITY): Payer: BC Managed Care – PPO

## 2015-05-26 ENCOUNTER — Encounter (HOSPITAL_COMMUNITY): Payer: Self-pay | Admitting: Emergency Medicine

## 2015-05-26 DIAGNOSIS — I10 Essential (primary) hypertension: Secondary | ICD-10-CM | POA: Insufficient documentation

## 2015-05-26 DIAGNOSIS — Z8719 Personal history of other diseases of the digestive system: Secondary | ICD-10-CM | POA: Insufficient documentation

## 2015-05-26 DIAGNOSIS — R042 Hemoptysis: Secondary | ICD-10-CM | POA: Diagnosis present

## 2015-05-26 DIAGNOSIS — F419 Anxiety disorder, unspecified: Secondary | ICD-10-CM | POA: Diagnosis not present

## 2015-05-26 DIAGNOSIS — J449 Chronic obstructive pulmonary disease, unspecified: Secondary | ICD-10-CM | POA: Diagnosis not present

## 2015-05-26 DIAGNOSIS — Z79899 Other long term (current) drug therapy: Secondary | ICD-10-CM | POA: Insufficient documentation

## 2015-05-26 DIAGNOSIS — J159 Unspecified bacterial pneumonia: Secondary | ICD-10-CM | POA: Insufficient documentation

## 2015-05-26 DIAGNOSIS — Z7982 Long term (current) use of aspirin: Secondary | ICD-10-CM | POA: Diagnosis not present

## 2015-05-26 DIAGNOSIS — F329 Major depressive disorder, single episode, unspecified: Secondary | ICD-10-CM | POA: Diagnosis not present

## 2015-05-26 LAB — CBC
HEMATOCRIT: 44.2 % (ref 39.0–52.0)
Hemoglobin: 15.3 g/dL (ref 13.0–17.0)
MCH: 32.6 pg (ref 26.0–34.0)
MCHC: 34.6 g/dL (ref 30.0–36.0)
MCV: 94 fL (ref 78.0–100.0)
PLATELETS: 189 10*3/uL (ref 150–400)
RBC: 4.7 MIL/uL (ref 4.22–5.81)
RDW: 12.4 % (ref 11.5–15.5)
WBC: 8.8 10*3/uL (ref 4.0–10.5)

## 2015-05-26 LAB — BASIC METABOLIC PANEL
Anion gap: 6 (ref 5–15)
BUN: 6 mg/dL (ref 6–20)
CHLORIDE: 103 mmol/L (ref 101–111)
CO2: 28 mmol/L (ref 22–32)
CREATININE: 0.96 mg/dL (ref 0.61–1.24)
Calcium: 9.3 mg/dL (ref 8.9–10.3)
GFR calc non Af Amer: >60 mL/min (ref >60)
Glucose, Bld: 111 mg/dL — ABNORMAL HIGH (ref 65–99)
POTASSIUM: 3.8 mmol/L (ref 3.5–5.1)
Sodium: 137 mmol/L (ref 135–145)

## 2015-05-26 NOTE — ED Notes (Signed)
Pt reports a productive cough for 1 week that has been yellowish and thick with some nasal congestion. About 4 hours ago pt started coughing up some bright red blood. Pt denies any chest pain or sob.

## 2015-05-27 ENCOUNTER — Emergency Department (HOSPITAL_COMMUNITY)
Admission: EM | Admit: 2015-05-27 | Discharge: 2015-05-27 | Disposition: A | Discharge status: Home / Self Care | Payer: BC Managed Care – PPO | Attending: Emergency Medicine | Admitting: Emergency Medicine

## 2015-05-27 DIAGNOSIS — J189 Pneumonia, unspecified organism: Secondary | ICD-10-CM

## 2015-05-27 DIAGNOSIS — R042 Hemoptysis: Secondary | ICD-10-CM

## 2015-05-27 MED ORDER — LEVOFLOXACIN 750 MG PO TABS
750.0000 mg | ORAL_TABLET | Freq: Once | ORAL | Status: DC
  Filled 2015-05-27: qty 1

## 2015-05-27 MED ORDER — LEVOFLOXACIN 750 MG PO TABS: 750.0000 mg | ORAL_TABLET | Freq: Every day | ORAL | Status: DC

## 2015-05-27 NOTE — Discharge Instructions (Signed)
Community-Acquired Pneumonia, Adult Pneumonia is an infection of the lungs. One type of pneumonia can happen while a person is in a hospital. A different type can happen when a person is not in a hospital (community-acquired pneumonia). It is easy for this kind to spread from person to person. It can spread to you if you breathe near an infected person who coughs or sneezes. Some symptoms include:  A dry cough.  A wet (productive) cough.  Fever.  Sweating.  Chest pain. HOME CARE  Take over-the-counter and prescription medicines only as told by your doctor.  Only take cough medicine if you are losing sleep.  If you were prescribed an antibiotic medicine, take it as told by your doctor. Do not stop taking the antibiotic even if you start to feel better.  Sleep with your head and neck raised (elevated). You can do this by putting a few pillows under your head, or you can sleep in a recliner.  Do not use tobacco products. These include cigarettes, chewing tobacco, and e-cigarettes. If you need help quitting, ask your doctor.  Drink enough water to keep your pee (urine) clear or pale yellow. A shot (vaccine) can help prevent pneumonia. Shots are often suggested for:  People older than 58 years of age.  People older than 58 years of age:  Who are having cancer treatment.  Who have long-term (chronic) lung disease.  Who have problems with their body's defense system (immune system). You may also prevent pneumonia if you take these actions:  Get the flu (influenza) shot every year.  Go to the dentist as often as told.  Wash your hands often. If soap and water are not available, use hand sanitizer. GET HELP IF:  You have a fever.  You lose sleep because your cough medicine does not help. GET HELP RIGHT AWAY IF:  You are short of breath and it gets worse.  You have more chest pain.  Your sickness gets worse. This is very serious if:  You are an older adult.  Your  body's defense system is weak.  You cough up blood.   This information is not intended to replace advice given to you by your health care provider. Make sure you discuss any questions you have with your health care provider.   Document Released: 12/07/2007 Document Revised: 03/11/2015 Document Reviewed: 10/15/2014 Elsevier Interactive Patient Education 2016 [employer].  Hemoptysis Hemoptysis means coughing up blood. The blood may come from the lungs and airways. It can also come from bleeding that occurs outside the lungs and airways. Coughing up blood can be a sign of a minor problem or a serious medical condition.  HOME CARE  Only take medicine as told by your doctor. Do not use medicines that help you stop coughing (cough suppressants) unless your doctor approves.  If you are given antibiotic medicine, take it as told. Finish it even if you start to feel better.  Do not smoke. Also avoid being around others when they are smoking.  Follow up with your doctor as told. GET HELP RIGHT AWAY IF:  You cough up bloody spit (mucus) for longer than a week.  You have a blood-producing cough that is severe or getting worse.  You have a blood-producing cough thatcomes and goes over time.  You have trouble breathing.   You throw up (vomit) blood.  You have bloody or black poop (stool).  You have chest pain.   You have night sweats.  You feel faint or  pass out.   You have a fever or lasting symptoms for more than 2-3 days.  You have a fever and your symptoms suddenly get worse. MAKE SURE YOU:  Understand these instructions.  Will watch your condition.  Will get help right away if you are not doing well or get worse.   This information is not intended to replace advice given to you by your health care provider. Make sure you discuss any questions you have with your health care provider.   Document Released: 06/06/2012 Document Reviewed: 06/06/2012 Elsevier  Interactive Patient Education [employer].

## 2015-05-27 NOTE — ED Provider Notes (Signed)
CSN: [id number]     Arrival date & time 05/26/15  2115 History  By signing my name below, I, Roger [name], attest that this documentation has been prepared under the direction and in the presence of Orpah Greek, MD. Electronically Signed: Judithann Sauger, ED Scribe. 05/27/2015. 1:03 AM.    Chief Complaint  Patient presents with  . Hemoptysis   The history is provided by the patient. No language interpreter was used.   HPI Comments: [name] is a 59 y.o. male with a hx of HTN, COPD, asthma, and pneumonia who presents to the Emergency Department complaining of gradually worsening intermittent productive cough onset several days ago. He states that he started coughing blood at approx 4 pm yesterday. He denies any SOB or CP. He states that he took aspirin PTA and normally takes it daily. He reports that he has had these symptoms about twice before and the last time he had these symptoms, he was dx with acute bronchitis. He states that he is here to make sure that he does not have a PE. He adds that his pulmonologist wanted to do an endoscopy but did not because he was sure that pt did not have a PE. Pt reports that he does not react well to prednisone.    Past Medical History  Diagnosis Date  . Anxiety   . Hypertension   . Depression   . COPD (chronic obstructive pulmonary disease) ([place])   . Asthma   . History of rectal bleeding   . Hemorrhoids   . Pneumonia     in 2015  . Post-operative nausea and vomiting    Past Surgical History  Procedure Laterality Date  . Tonsillectomy    . Dental surgery    . Nose surgery     Family History  Problem Relation Age of Onset  . Anxiety disorder Mother   . Depression Mother   . Lung disease Mother     "arthritic lung"  . Anxiety disorder Sister   . Depression Sister   . Anxiety disorder Brother   . Lung disease Father   . Lung cancer Father   . Anxiety disorder Sister   . Anxiety disorder Brother   . Anxiety disorder  Brother   . Anxiety disorder Brother   . Colon cancer Brother    Social History  Substance Use Topics  . Smoking status: Current Every Day Smoker -- 0.50 packs/day for 25 years    Types: Cigarettes  . Smokeless tobacco: Never Used  . Alcohol Use: No    Review of Systems  Constitutional: Negative for fever and chills.  Respiratory: Positive for cough. Negative for shortness of breath.   Cardiovascular: Negative for chest pain.  All other systems reviewed and are negative.     Allergies  Demerol; Erythromycin; Sulfa antibiotics; and Prednisone  Home Medications   Prior to Admission medications   Medication Sig Start Date End Date Taking? Authorizing Provider  albuterol (PROVENTIL HFA;VENTOLIN HFA) 108 (90 BASE) MCG/ACT inhaler Inhale 1-2 puffs into the lungs every 6 (six) hours as needed for wheezing or shortness of breath. 04/13/15   Okey Regal, PA-C  ALPRAZolam Duanne Moron) 1 MG tablet Take 1 tab 3 times and day and 1/2 at bed time 03/20/15   Kathlee Nations, MD  amoxicillin-clavulanate (AUGMENTIN) 875-125 MG tablet Take 1 tablet by mouth every 12 (twelve) hours. 04/13/15   Okey Regal, PA-C  aspirin EC 325 MG tablet Take 325 mg by mouth daily.  Historical Provider, MD  atenolol (TENORMIN) 50 MG tablet Take 50 mg by mouth every morning.     Historical Provider, MD  Cetirizine HCl (ZYRTEC ALLERGY) 10 MG CAPS Take 1 capsule (10 mg total) by mouth daily. Patient not taking: Reported on 10/20/2014 07/08/14   Tatyana Kirichenko, PA-C  ibuprofen (ADVIL,MOTRIN) 200 MG tablet Take 200 mg by mouth every 6 (six) hours as needed for headache, mild pain or moderate pain.    Historical Provider, MD  mirtazapine (REMERON) 15 MG tablet Take 15 mg by mouth at bedtime.    Historical Provider, MD   BP 129/68 mmHg  Pulse 66  Temp(Src) 98 F (36.7 C) (Oral)  Resp 16  Ht '5\' 11"'$  (1.803 m)  Wt 187 lb (84.823 kg)  BMI 26.09 kg/m2  SpO2 96% Physical Exam  Constitutional: He is oriented to  person, place, and time. He appears well-developed and well-nourished. No distress.  HENT:  Head: Normocephalic and atraumatic.  Right Ear: Hearing normal.  Left Ear: Hearing normal.  Nose: Nose normal.  Mouth/Throat: Oropharynx is clear and moist and mucous membranes are normal.  Eyes: Conjunctivae and EOM are normal. Pupils are equal, round, and reactive to light.  Neck: Normal range of motion. Neck supple.  Cardiovascular: Regular rhythm, S1 normal and S2 normal.  Exam reveals no gallop and no friction rub.   No murmur heard. Pulmonary/Chest: Effort normal. No respiratory distress. He has no wheezes. He exhibits no tenderness.  Diminished breath sounds bilaterally.   Abdominal: Soft. Normal appearance and bowel sounds are normal. There is no hepatosplenomegaly. There is no tenderness. There is no rebound, no guarding, no tenderness at McBurney's point and negative Murphy's sign. No hernia.  Musculoskeletal: Normal range of motion.  Neurological: He is alert and oriented to person, place, and time. He has normal strength. No cranial nerve deficit or sensory deficit. Coordination normal. GCS eye subscore is 4. GCS verbal subscore is 5. GCS motor subscore is 6.  Skin: Skin is warm, dry and intact. No rash noted. No cyanosis.  Psychiatric: He has a normal mood and affect. His speech is normal and behavior is normal. Thought content normal.  Nursing note and vitals reviewed.   ED Course  Procedures (including critical care time) DIAGNOSTIC STUDIES: Oxygen Saturation is 96% on RA, adequate by my interpretation.    COORDINATION OF CARE: 1:01 AM- Pt advised of plan for treatment and pt agrees. Recommended to use albuterol and will be placed on antibiotics. Advised to stop taking aspirin for a week. Follow up with his Pulmonologist. 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    Labs Review Labs Reviewed  BASIC METABOLIC PANEL - Abnormal; Notable for the following:    Glucose, Bld  111 (*)    All other components within normal limits  CBC    Imaging Review Dg Chest 2 View  05/26/2015  CLINICAL DATA:  Bloody sputum, worsening.  COPD. EXAM: CHEST  2 VIEW COMPARISON:  04/13/2015 FINDINGS: Severe emphysema. Increased interstitial accentuation anteriorly in the right upper lobe. But chronic hilar prominence. No paratracheal adenopathy identified. No pleural effusion. IMPRESSION: 1. Increased interstitial accentuation and vague opacity anteriorly in the right upper lobe, probably from recurrent pneumonia, and increased from 04/13/2015. 2. Severe emphysema. Electronically Signed   By: Van Clines M.D.   On: 05/26/2015 21:48   Orpah Greek, MD has personally reviewed and evaluated these images and lab results as part of his medical decision-making.  EKG Interpretation None      MDM   Final diagnoses:  None   hemoptysis Pneumonia  Presents to the ER for evaluation of hemoptysis. Patient reports that he has been experiencing cough has been productive for 1 week. It had been previously yellowish and thick, but 4 hours before arrival to the ER he started to cough up blood and blood-tinged sputum. This has happened recently and he was diagnosed with bronchitis and pneumonia in the past. Reviewing his records reveals that he did have CT angiography in September of this year for similar symptoms and it revealed pneumonia. No evidence of blood clot. Patient does not have anything that is concerning for blood clot at this time. Chest x-ray today shows recurrence of pneumonia similar to previous. Will treat with Levaquin. He reports that he does not tolerate prednisone. He does use pro-air, encouraged to use it. He sees Dr. Melvyn Novas, pulmonology. Will schedule follow-up to ensure resolution. Previous CT scan did not show any evidence of mass or lung cancer, but he does have a long smoking history, will require follow-up. Patient counseled return to the ER for worsening  bleeding.  I personally performed the services described in this documentation, which was scribed in my presence. The recorded information has been reviewed and is accurate.   Orpah Greek, MD 05/27/15 [phone]

## 2015-06-01 ENCOUNTER — Emergency Department (HOSPITAL_COMMUNITY): Payer: BC Managed Care – PPO

## 2015-06-01 ENCOUNTER — Emergency Department (HOSPITAL_COMMUNITY)
Admission: EM | Admit: 2015-06-01 | Discharge: 2015-06-01 | Disposition: A | Discharge status: Home / Self Care | Payer: BC Managed Care – PPO | Attending: Emergency Medicine | Admitting: Emergency Medicine

## 2015-06-01 ENCOUNTER — Encounter (HOSPITAL_COMMUNITY): Payer: Self-pay | Admitting: Family Medicine

## 2015-06-01 DIAGNOSIS — Z8719 Personal history of other diseases of the digestive system: Secondary | ICD-10-CM | POA: Insufficient documentation

## 2015-06-01 DIAGNOSIS — Z8701 Personal history of pneumonia (recurrent): Secondary | ICD-10-CM | POA: Insufficient documentation

## 2015-06-01 DIAGNOSIS — F329 Major depressive disorder, single episode, unspecified: Secondary | ICD-10-CM | POA: Insufficient documentation

## 2015-06-01 DIAGNOSIS — F17210 Nicotine dependence, cigarettes, uncomplicated: Secondary | ICD-10-CM | POA: Insufficient documentation

## 2015-06-01 DIAGNOSIS — Z7982 Long term (current) use of aspirin: Secondary | ICD-10-CM | POA: Diagnosis not present

## 2015-06-01 DIAGNOSIS — Z79899 Other long term (current) drug therapy: Secondary | ICD-10-CM | POA: Diagnosis not present

## 2015-06-01 DIAGNOSIS — I10 Essential (primary) hypertension: Secondary | ICD-10-CM | POA: Insufficient documentation

## 2015-06-01 DIAGNOSIS — F419 Anxiety disorder, unspecified: Secondary | ICD-10-CM | POA: Insufficient documentation

## 2015-06-01 DIAGNOSIS — R042 Hemoptysis: Secondary | ICD-10-CM | POA: Insufficient documentation

## 2015-06-01 DIAGNOSIS — J449 Chronic obstructive pulmonary disease, unspecified: Secondary | ICD-10-CM | POA: Diagnosis not present

## 2015-06-01 LAB — BASIC METABOLIC PANEL
Anion gap: 8 (ref 5–15)
BUN: 10 mg/dL (ref 6–20)
CHLORIDE: 105 mmol/L (ref 101–111)
CO2: 24 mmol/L (ref 22–32)
CREATININE: 1 mg/dL (ref 0.61–1.24)
Calcium: 9.5 mg/dL (ref 8.9–10.3)
GFR calc Af Amer: >60 mL/min (ref >60)
Glucose, Bld: 240 mg/dL — ABNORMAL HIGH (ref 65–99)
POTASSIUM: 3.5 mmol/L (ref 3.5–5.1)
SODIUM: 137 mmol/L (ref 135–145)

## 2015-06-01 LAB — CBC WITH DIFFERENTIAL/PLATELET
Basophils Absolute: 0.1 10*3/uL (ref 0.0–0.1)
Basophils Relative: 1 %
Eosinophils Absolute: 0.1 10*3/uL (ref 0.0–0.7)
Eosinophils Relative: 1 %
HEMATOCRIT: 47.6 % (ref 39.0–52.0)
HEMOGLOBIN: 16.9 g/dL (ref 13.0–17.0)
LYMPHS ABS: 1.5 10*3/uL (ref 0.7–4.0)
LYMPHS PCT: 21 %
MCH: 33.1 pg (ref 26.0–34.0)
MCHC: 35.5 g/dL (ref 30.0–36.0)
MCV: 93.2 fL (ref 78.0–100.0)
Monocytes Absolute: 0.5 10*3/uL (ref 0.1–1.0)
Monocytes Relative: 6 %
NEUTROS ABS: 5.1 10*3/uL (ref 1.7–7.7)
NEUTROS PCT: 71 %
Platelets: 168 10*3/uL (ref 150–400)
RBC: 5.11 MIL/uL (ref 4.22–5.81)
RDW: 12.3 % (ref 11.5–15.5)
WBC: 7.2 10*3/uL (ref 4.0–10.5)

## 2015-06-01 MED ORDER — IOHEXOL 300 MG/ML  SOLN
80.0000 mL | Freq: Once | INTRAMUSCULAR | Status: AC | PRN
  Administered 2015-06-01: 80 mL via INTRAVENOUS

## 2015-06-01 MED ORDER — AMOXICILLIN-POT CLAVULANATE ER 1000-62.5 MG PO TB12: 2.0000 | ORAL_TABLET | Freq: Two times a day (BID) | ORAL | Status: DC

## 2015-06-01 MED ORDER — DOXYCYCLINE HYCLATE 100 MG PO CAPS: 100.0000 mg | ORAL_CAPSULE | Freq: Two times a day (BID) | ORAL | Status: DC

## 2015-06-01 NOTE — ED Provider Notes (Signed)
CSN: [id number]     Arrival date & time 06/01/15  1026 History   First MD Initiated Contact with Patient 06/01/15 1047     Chief Complaint  Patient presents with  . Hemoptysis    Patient is a 59 y.o. male presenting with cough. The history is provided by the patient.  Cough Cough characteristics:  Productive Sputum characteristics:  Bloody Severity:  Moderate Onset quality:  Gradual Duration:  1 week Timing:  Constant Chronicity:  New Smoker: yes   Relieved by:  Nothing Ineffective treatments: antibiotics. Associated symptoms: no chest pain, no chills, no diaphoresis, no fever, no headaches, no rash, no rhinorrhea, no shortness of breath, no sore throat and no wheezing   Risk factors: recent infection   Risk factors: no recent travel     Past Medical History  Diagnosis Date  . Anxiety   . Hypertension   . Depression   . COPD (chronic obstructive pulmonary disease) ([place])   . Asthma   . History of rectal bleeding   . Hemorrhoids   . Pneumonia     in 2015  . Post-operative nausea and vomiting    Past Surgical History  Procedure Laterality Date  . Tonsillectomy    . Dental surgery    . Nose surgery     Family History  Problem Relation Age of Onset  . Anxiety disorder Mother   . Depression Mother   . Lung disease Mother     "arthritic lung"  . Anxiety disorder Sister   . Depression Sister   . Anxiety disorder Brother   . Lung disease Father   . Lung cancer Father   . Anxiety disorder Sister   . Anxiety disorder Brother   . Anxiety disorder Brother   . Anxiety disorder Brother   . Colon cancer Brother    Social History  Substance Use Topics  . Smoking status: Current Every Day Smoker -- 0.50 packs/day for 25 years    Types: Cigarettes  . Smokeless tobacco: Never Used  . Alcohol Use: No    Review of Systems  Constitutional: Negative for fever, chills, diaphoresis and unexpected weight change.  HENT: Negative for rhinorrhea and sore throat.   Eyes:  Negative for visual disturbance.  Respiratory: Positive for cough. Negative for shortness of breath and wheezing.   Cardiovascular: Negative for chest pain.  Gastrointestinal: Negative for nausea, vomiting, abdominal pain, diarrhea and constipation.  Genitourinary: Negative for dysuria and hematuria.  Musculoskeletal: Negative for back pain and neck pain.  Skin: Negative for rash.  Neurological: Negative for syncope and headaches.  Psychiatric/Behavioral: Negative for confusion.  All other systems reviewed and are negative.  Allergies  Demerol; Erythromycin; Sulfa antibiotics; and Prednisone  Home Medications   Prior to Admission medications   Medication Sig Start Date End Date Taking? Authorizing Provider  ALPRAZolam Duanne Moron) 1 MG tablet Take 1 tab 3 times and day and 1/2 at bed time Patient taking differently: Take 1 mg by mouth 5 (five) times daily.  03/20/15  Yes Kathlee Nations, MD  aspirin EC 325 MG tablet Take 325 mg by mouth daily.   Yes Historical Provider, MD  atenolol (TENORMIN) 50 MG tablet Take 50 mg by mouth every morning.    Yes Historical Provider, MD  ibuprofen (ADVIL,MOTRIN) 200 MG tablet Take 200 mg by mouth every 6 (six) hours as needed for headache, mild pain or moderate pain.   Yes Historical Provider, MD  mirtazapine (REMERON) 15 MG tablet Take 15 mg by  mouth at bedtime.   Yes Historical Provider, MD  albuterol (PROVENTIL HFA;VENTOLIN HFA) 108 (90 BASE) MCG/ACT inhaler Inhale 1-2 puffs into the lungs every 6 (six) hours as needed for wheezing or shortness of breath. 04/13/15   Okey Regal, PA-C  amoxicillin-clavulanate (AUGMENTIN XR) 1000-62.5 MG 12 hr tablet Take 2 tablets by mouth 2 (two) times daily. 06/01/15   Gustavus Bryant, MD  Cetirizine HCl (ZYRTEC ALLERGY) 10 MG CAPS Take 1 capsule (10 mg total) by mouth daily. Patient not taking: Reported on 10/20/2014 07/08/14   Jeannett Senior, PA-C  doxycycline (VIBRAMYCIN) 100 MG capsule Take 1 capsule (100 mg total) by  mouth 2 (two) times daily. 06/01/15   Gustavus Bryant, MD   BP 133/78 mmHg  Pulse 62  Temp(Src) 97.4 F (36.3 C) (Oral)  Resp 20  SpO2 93% Physical Exam  Constitutional: He is oriented to person, place, and time. He appears well-developed and well-nourished. No distress.  HENT:  Head: Normocephalic and atraumatic.  Mouth/Throat: Oropharynx is clear and moist.  Eyes: EOM are normal.  Neck: Neck supple. No JVD present.  Cardiovascular: Normal rate, regular rhythm, normal heart sounds and intact distal pulses.   Pulmonary/Chest: Effort normal and breath sounds normal.  Abdominal: Soft. He exhibits no distension. There is no tenderness.  Musculoskeletal: Normal range of motion. He exhibits no edema.  Neurological: He is alert and oriented to person, place, and time. No cranial nerve deficit.  Skin: Skin is warm and dry.  Psychiatric: His behavior is normal.    ED Course  Procedures  None   Labs Review Labs Reviewed  BASIC METABOLIC PANEL - Abnormal; Notable for the following:    Glucose, Bld 240 (*)    All other components within normal limits  CBC WITH DIFFERENTIAL/PLATELET    Imaging Review Dg Chest 2 View  06/01/2015  CLINICAL DATA:  Hemoptysis. EXAM: CHEST  2 VIEW COMPARISON:  05/26/2015. FINDINGS: Trachea is midline. Heart size normal. Emphysema. Mild patchiness in the mid right lung zone appears new. Lungs are otherwise clear. No pleural fluid. IMPRESSION: Mild patchy opacification in the right upper lobe anterior segment appears new and may be due to pulmonary hemorrhage or pneumonia. Electronically Signed   By: Lorin Picket M.D.   On: 06/01/2015 12:15   Ct Chest W Contrast  06/01/2015  CLINICAL DATA:  Recent diagnosis of pneumonia. Status post antibiotics, with persistent symptoms. Hemoptysis. COPD. 12.5 pack-year smoking history. EXAM: CT CHEST WITH CONTRAST TECHNIQUE: Multidetector CT imaging of the chest was performed during intravenous contrast administration.  CONTRAST:  80 cc of Omnipaque 300 COMPARISON:  Chest radiograph of earlier today. Most recent CT of 03/11/2014. FINDINGS: Mediastinum/Nodes: Mildly tortuous thoracic aorta. Normal heart size, without pericardial effusion. Multivessel coronary artery atherosclerosis. No central pulmonary embolism, on this non-dedicated study. No mediastinal or hilar adenopathy. Lungs/Pleura: No pleural fluid. Moderate to marked centrilobular emphysema. Lower lobe predominant bronchial wall thickening. Patchy right upper lobe airspace disease. A subpleural right lower lobe 4 mm nodule on image 52/series 3 is either new since the prior exam or was obscured on that study. Minimal right lower lobe opacification including on image 44/series 3 is also favored to be infectious. Nodular density in the posterior right upper lobe on image 23/series 3 measures 5 mm and was present on the prior exam, favoring a benign etiology. Subpleural nodule in the left lower lobe measures 3 mm on image 45 of series 3 and is also either new or was obscured on the prior CT.  More lateral 3 mm left lower lobe pulmonary nodule on image 48/series 3 is also likely new. Upper abdomen: Normal imaged portions of the liver, spleen, stomach, pancreas, gallbladder, adrenal glands. Musculoskeletal: No acute osseous abnormality. C6-7 degenerative disc disease. IMPRESSION: 1. Right upper lobe airspace disease. Distribution and morphology favor infection. 2. Advanced centrilobular emphysema with pulmonary nodules up to 4 mm. Given risk factors for bronchogenic carcinoma, follow-up chest CT at 1 year is recommended. This recommendation follows the consensus statement: "Guidelines for Management of Small Pulmonary Nodules Detected on CT Scans: A Statement from the [employer]" as published in Radiology 2005; 237:395-400. Available online at: https://www.arnold.com/. 3. Age advanced coronary artery atherosclerosis. Recommend assessment of  coronary risk factors and consideration of medical therapy. Electronically Signed   By: Abigail Miyamoto M.D.   On: 06/01/2015 14:02   I have personally reviewed and evaluated these images and lab results as part of my medical decision-making.   MDM   Final diagnoses:  Hemoptysis    59 yo M smoker presents with 1 week of cough with dark hemoptysis. Was treated with levoquin for CAP last week. Cough frequency has improved but when he does cough, he is having persistent hemoptysis of dark red blood. Denise leg swelling, chest pain, lightheadedness, fever, weight change, sick contacts, TB exposure. Appears well on exam. AF, VSS. Lungs sounds clear. CXR shows worsening RUL opacity. Will get CT to r/o malignancy and assess for source of bleeding.  CT with findings consistent with infectious process. Nothing to suggest malignancy. Patient counseled on follow up CT. I provided additional verbal discharge instructions including follow up and return precautions.   Will d/c home with augmentin plus doxycycline given failure of levoquin. Stable for d/c at this time.   Discussed with Dr. Jeneen Rinks.   Gustavus Bryant, MD 06/01/15 [place], MD 06/10/15 [phone]

## 2015-06-01 NOTE — ED Notes (Signed)
Pt in CT during hourly round.

## 2015-06-01 NOTE — ED Notes (Signed)
MD Jeneen Rinks at the bedside

## 2015-06-01 NOTE — ED Notes (Signed)
Pt is back in room, from x-ray.

## 2015-06-01 NOTE — ED Notes (Signed)
Pt returned from CT. Tech placed patient back on monitor.

## 2015-06-01 NOTE — ED Provider Notes (Signed)
Pt seen and evaluated.  D/W Resident.  Agree with Dx of Pneumonia.  We discussed outpatient treatment. This is appropriate he is not hypoxemic. Plan will be Augmentin and doxycycline. I stressed to him that he needs to have follow-up imaging in 6-8 weeks to ensure resolution of his opacity. He expressed understanding of this.  Tanna Furry, MD 06/01/15 [phone]

## 2015-06-01 NOTE — ED Notes (Signed)
EDP at the bedside.  ?

## 2015-06-01 NOTE — ED Notes (Signed)
Pt here for spitting up blood. sts treated for PNA and not any better.

## 2015-06-01 NOTE — ED Notes (Signed)
Pt returned from X-ray.  

## 2015-06-01 NOTE — Discharge Instructions (Signed)
Hemoptysis  Hemoptysis, which means coughing up blood, can be a sign of a minor problem or a serious medical condition. The blood that is coughed up may come from the lungs and airways. Coughed-up blood can also come from bleeding that occurs outside the lungs and airways. Blood can drain into the windpipe during a severe nosebleed or when blood is vomited from the stomach. Because hemoptysis can be a sign of something serious, a medical evaluation is required. For some people with hemoptysis, no definite cause is ever identified.  CAUSES   The most common cause of hemoptysis is bronchitis. Some other common causes include:   · A ruptured blood vessel caused by coughing or an infection.    · A medical condition that causes damage to the large air passageways (bronchiectasis).    · A blood clot in the lungs (pulmonary embolism).    · Pneumonia.    · Tuberculosis.    · Breathing in a small foreign object.    · Cancer.  For some people with hemoptysis, no definite cause is ever identified.    HOME CARE INSTRUCTIONS  · Only take over-the-counter or prescription medicines as directed by your caregiver. Do not use cough suppressants unless your caregiver approves.  · If your caregiver prescribes antibiotic medicines, take them as directed. Finish them even if you start to feel better.  · Do not smoke. Also avoid secondhand smoke.  · Follow up with your caregiver as directed.  SEEK IMMEDIATE MEDICAL CARE IF:   · You cough up bloody mucus for longer than a week.  · You have a blood-producing cough that is severe or getting worse.  · You have a blood-producing cough that comes and goes over time.  · You develop problems with your breathing.    · You vomit blood.  · You develop bloody or black-colored stools.  · You have chest pain.    · You develop night sweats.  · You feel faint or pass out.    · You have a fever or persistent symptoms for more than 2-3 days.    · You have a fever and your symptoms suddenly get worse.  MAKE  SURE YOU:  · Understand these instructions.  · Will watch your condition.  · Will get help right away if you are not doing well or get worse.     This information is not intended to replace advice given to you by your health care provider. Make sure you discuss any questions you have with your health care provider.     Document Released: 08/29/2001 Document Revised: 06/06/2012 Document Reviewed: 04/06/2012  Elsevier Interactive Patient Education ©2016 Elsevier Inc.

## 2015-06-08 ENCOUNTER — Encounter (HOSPITAL_COMMUNITY): Payer: Self-pay | Admitting: Family Medicine

## 2015-06-08 ENCOUNTER — Emergency Department (HOSPITAL_COMMUNITY)
Admission: EM | Admit: 2015-06-08 | Discharge: 2015-06-08 | Disposition: A | Discharge status: Home / Self Care | Payer: BC Managed Care – PPO | Attending: Emergency Medicine | Admitting: Emergency Medicine

## 2015-06-08 ENCOUNTER — Emergency Department (HOSPITAL_COMMUNITY): Payer: BC Managed Care – PPO

## 2015-06-08 DIAGNOSIS — Z7982 Long term (current) use of aspirin: Secondary | ICD-10-CM | POA: Insufficient documentation

## 2015-06-08 DIAGNOSIS — J449 Chronic obstructive pulmonary disease, unspecified: Secondary | ICD-10-CM | POA: Insufficient documentation

## 2015-06-08 DIAGNOSIS — I10 Essential (primary) hypertension: Secondary | ICD-10-CM | POA: Insufficient documentation

## 2015-06-08 DIAGNOSIS — R042 Hemoptysis: Secondary | ICD-10-CM | POA: Diagnosis present

## 2015-06-08 DIAGNOSIS — F17210 Nicotine dependence, cigarettes, uncomplicated: Secondary | ICD-10-CM | POA: Insufficient documentation

## 2015-06-08 DIAGNOSIS — F419 Anxiety disorder, unspecified: Secondary | ICD-10-CM | POA: Diagnosis not present

## 2015-06-08 DIAGNOSIS — Z79899 Other long term (current) drug therapy: Secondary | ICD-10-CM | POA: Insufficient documentation

## 2015-06-08 DIAGNOSIS — J189 Pneumonia, unspecified organism: Secondary | ICD-10-CM

## 2015-06-08 DIAGNOSIS — F329 Major depressive disorder, single episode, unspecified: Secondary | ICD-10-CM | POA: Diagnosis not present

## 2015-06-08 DIAGNOSIS — J159 Unspecified bacterial pneumonia: Secondary | ICD-10-CM | POA: Insufficient documentation

## 2015-06-08 LAB — CBC WITH DIFFERENTIAL/PLATELET
BASOS ABS: 0.1 10*3/uL (ref 0.0–0.1)
Basophils Relative: 1 %
Eosinophils Absolute: 0.1 10*3/uL (ref 0.0–0.7)
Eosinophils Relative: 2 %
HEMATOCRIT: 44.2 % (ref 39.0–52.0)
HEMOGLOBIN: 15.4 g/dL (ref 13.0–17.0)
Lymphocytes Relative: 29 %
Lymphs Abs: 2.1 10*3/uL (ref 0.7–4.0)
MCH: 32.6 pg (ref 26.0–34.0)
MCHC: 34.8 g/dL (ref 30.0–36.0)
MCV: 93.4 fL (ref 78.0–100.0)
MONO ABS: 0.5 10*3/uL (ref 0.1–1.0)
MONOS PCT: 6 %
Neutro Abs: 4.4 10*3/uL (ref 1.7–7.7)
Neutrophils Relative %: 62 %
Platelets: 192 10*3/uL (ref 150–400)
RBC: 4.73 MIL/uL (ref 4.22–5.81)
RDW: 12.4 % (ref 11.5–15.5)
WBC: 7.1 10*3/uL (ref 4.0–10.5)

## 2015-06-08 LAB — BASIC METABOLIC PANEL
ANION GAP: 8 (ref 5–15)
CHLORIDE: 104 mmol/L (ref 101–111)
CO2: 27 mmol/L (ref 22–32)
Calcium: 9.3 mg/dL (ref 8.9–10.3)
Creatinine, Ser: 0.82 mg/dL (ref 0.61–1.24)
GFR calc Af Amer: >60 mL/min (ref >60)
GFR calc non Af Amer: >60 mL/min (ref >60)
GLUCOSE: 98 mg/dL (ref 65–99)
POTASSIUM: 3.9 mmol/L (ref 3.5–5.1)
Sodium: 139 mmol/L (ref 135–145)

## 2015-06-08 NOTE — Discharge Instructions (Signed)
Schedule a follow up with your pulmonologist, Dr. Melvyn Novas, for a follow up visit. Continue taking your antibiotics as prescribed.    Community-Acquired Pneumonia, Adult Pneumonia is an infection of the lungs. There are different types of pneumonia. One type can develop while a person is in a hospital. A different type, called community-acquired pneumonia, develops in people who are not, or have not recently been, in the hospital or other health care facility.  CAUSES Pneumonia may be caused by bacteria, viruses, or funguses. Community-acquired pneumonia is often caused by Streptococcus pneumonia bacteria. These bacteria are often passed from one person to another by breathing in droplets from the cough or sneeze of an infected person. RISK FACTORS The condition is more likely to develop in:  People who havechronic diseases, such as chronic obstructive pulmonary disease (COPD), asthma, congestive heart failure, cystic fibrosis, diabetes, or kidney disease.  People who haveearly-stage or late-stage HIV.  People who havesickle cell disease.  People who havehad their spleen removed (splenectomy).  People who havepoor Human resources officer.  People who havemedical conditions that increase the risk of breathing in (aspirating) secretions their own mouth and nose.   People who havea weakened immune system (immunocompromised).  People who smoke.  People whotravel to areas where pneumonia-causing germs commonly exist.  People whoare around animal habitats or animals that have pneumonia-causing germs, including birds, bats, rabbits, cats, and farm animals. SYMPTOMS Symptoms of this condition include:  Adry cough.  A wet (productive) cough.  Fever.  Sweating.  Chest pain, especially when breathing deeply or coughing.  Rapid breathing or difficulty breathing.  Shortness of breath.  Shaking chills.  Fatigue.  Muscle aches. DIAGNOSIS Your health care provider will take a  medical history and perform a physical exam. You may also have other tests, including:  Imaging studies of your chest, including X-rays.  Tests to check your blood oxygen level and other blood gases.  Other tests on blood, mucus (sputum), fluid around your lungs (pleural fluid), and urine. If your pneumonia is severe, other tests may be done to identify the specific cause of your illness. TREATMENT The type of treatment that you receive depends on many factors, such as the cause of your pneumonia, the medicines you take, and other medical conditions that you have. For most adults, treatment and recovery from pneumonia may occur at home. In some cases, treatment must happen in a hospital. Treatment may include:  Antibiotic medicines, if the pneumonia was caused by bacteria.  Antiviral medicines, if the pneumonia was caused by a virus.  Medicines that are given by mouth or through an IV tube.  Oxygen.  Respiratory therapy. Although rare, treating severe pneumonia may include:  Mechanical ventilation. This is done if you are not breathing well on your own and you cannot maintain a safe blood oxygen level.  Thoracentesis. This procedureremoves fluid around one lung or both lungs to help you breathe better. HOME CARE INSTRUCTIONS  Take over-the-counter and prescription medicines only as told by your health care provider.  Only takecough medicine if you are losing sleep. Understand that cough medicine can prevent your body's natural ability to remove mucus from your lungs.  If you were prescribed an antibiotic medicine, take it as told by your health care provider. Do not stop taking the antibiotic even if you start to feel better.  Sleep in a semi-upright position at night. Try sleeping in a reclining chair, or place a few pillows under your head.  Do not use tobacco products, including  cigarettes, chewing tobacco, and e-cigarettes. If you need help quitting, ask your health care  provider.  Drink enough water to keep your urine clear or pale yellow. This will help to thin out mucus secretions in your lungs. PREVENTION There are ways that you can decrease your risk of developing community-acquired pneumonia. Consider getting a pneumococcal vaccine if:  You are older than 58 years of age.  You are older than 58 years of age and are undergoing cancer treatment, have chronic lung disease, or have other medical conditions that affect your immune system. Ask your health care provider if this applies to you. There are different types and schedules of pneumococcal vaccines. Ask your health care provider which vaccination option is best for you. You may also prevent community-acquired pneumonia if you take these actions:  Get an influenza vaccine every year. Ask your health care provider which type of influenza vaccine is best for you.  Go to the dentist on a regular basis.  Wash your hands often. Use hand sanitizer if soap and water are not available. SEEK MEDICAL CARE IF:  You have a fever.  You are losing sleep because you cannot control your cough with cough medicine. SEEK IMMEDIATE MEDICAL CARE IF:  You have worsening shortness of breath.  You have increased chest pain.  Your sickness becomes worse, especially if you are an older adult or have a weakened immune system.  You cough up blood.   This information is not intended to replace advice given to you by your health care provider. Make sure you discuss any questions you have with your health care provider.   Document Released: 06/20/2005 Document Revised: 03/11/2015 Document Reviewed: 10/15/2014 Elsevier Interactive Patient Education 2016 [employer].  Hemoptysis Hemoptysis, which means coughing up blood, can be a sign of a minor problem or a serious medical condition. The blood that is coughed up may come from the lungs and airways. Coughed-up blood can also come from bleeding that occurs outside the  lungs and airways. Blood can drain into the windpipe during a severe nosebleed or when blood is vomited from the stomach. Because hemoptysis can be a sign of something serious, a medical evaluation is required. For some people with hemoptysis, no definite cause is ever identified. CAUSES  The most common cause of hemoptysis is bronchitis. Some other common causes include:   A ruptured blood vessel caused by coughing or an infection.   A medical condition that causes damage to the large air passageways (bronchiectasis).   A blood clot in the lungs (pulmonary embolism).   Pneumonia.   Tuberculosis.   Breathing in a small foreign object.   Cancer. For some people with hemoptysis, no definite cause is ever identified.  HOME CARE INSTRUCTIONS  Only take over-the-counter or prescription medicines as directed by your caregiver. Do not use cough suppressants unless your caregiver approves.  If your caregiver prescribes antibiotic medicines, take them as directed. Finish them even if you start to feel better.  Do not smoke. Also avoid secondhand smoke.  Follow up with your caregiver as directed. SEEK IMMEDIATE MEDICAL CARE IF:   You cough up bloody mucus for longer than a week.  You have a blood-producing cough that is severe or getting worse.  You have a blood-producing cough thatcomes and goes over time.  You develop problems with your breathing.   You vomit blood.  You develop bloody or black-colored stools.  You have chest pain.   You develop night sweats.  You  feel faint or pass out.   You have a fever or persistent symptoms for more than 2-3 days.  You have a fever and your symptoms suddenly get worse. MAKE SURE YOU:  Understand these instructions.  Will watch your condition.  Will get help right away if you are not doing well or get worse.   This information is not intended to replace advice given to you by your health care provider. Make sure you  discuss any questions you have with your health care provider.   Document Released: 08/29/2001 Document Revised: 06/06/2012 Document Reviewed: 04/06/2012 Elsevier Interactive Patient Education [employer].

## 2015-06-08 NOTE — ED Notes (Signed)
Pt here for continued blood in sputum after being dx with PNA.

## 2015-06-08 NOTE — ED Provider Notes (Signed)
CSN: [id number]     Arrival date & time 06/08/15  1224 History   First MD Initiated Contact with Patient 06/08/15 1717     Chief Complaint  Patient presents with  . Hemoptysis   HPI  Roger [name] is a 59 year old male with past medical history of hypertension, anxiety, COPD and depression presenting with hemoptysis x 2 weeks. He was seen for the same complaint on 11/23 and 11/28 and diagnosed with pneumonia. He states that he has continued to have hemoptysis with his cough. He reports compliance with his antibiotics. He reports going to his primary care doctor this morning who instructed him to come to the emergency department for further evaluation because his oxygen was low in the office. He cannot remember what his oxygen was. He reports improvement in his cough since last discharged from the emergency department. He states that he is not coughing as frequently but every time he does cough he continues to produce blood-tinged, mucousy sputum. Denies fevers, chills, headaches, dizziness, syncope, congestions, sore throat, chest pain, shortness of breath, abdominal pain, nausea or vomiting. Denies recent travel. He has not spent time in the presence system or the [employer]. He reports being born in the [place]. He denies immunodeficiency.  Past Medical History  Diagnosis Date  . Anxiety   . Hypertension   . Depression   . COPD (chronic obstructive pulmonary disease) ([place])   . Asthma   . History of rectal bleeding   . Hemorrhoids   . Pneumonia     in 2015  . Post-operative nausea and vomiting    Past Surgical History  Procedure Laterality Date  . Tonsillectomy    . Dental surgery    . Nose surgery     Family History  Problem Relation Age of Onset  . Anxiety disorder Mother   . Depression Mother   . Lung disease Mother     "arthritic lung"  . Anxiety disorder Sister   . Depression Sister   . Anxiety disorder Brother   . Lung disease Father   . Lung cancer Father   . Anxiety  disorder Sister   . Anxiety disorder Brother   . Anxiety disorder Brother   . Anxiety disorder Brother   . Colon cancer Brother    Social History  Substance Use Topics  . Smoking status: Current Every Day Smoker -- 0.50 packs/day for 25 years    Types: Cigarettes  . Smokeless tobacco: Never Used  . Alcohol Use: No    Review of Systems  Respiratory: Positive for cough.   All other systems reviewed and are negative.     Allergies  Demerol; Erythromycin; Prednisone; and Sulfa antibiotics  Home Medications   Prior to Admission medications   Medication Sig Start Date End Date Taking? Authorizing Provider  albuterol (PROVENTIL HFA;VENTOLIN HFA) 108 (90 BASE) MCG/ACT inhaler Inhale 1-2 puffs into the lungs every 6 (six) hours as needed for wheezing or shortness of breath. 04/13/15  Yes Jeffrey Hedges, PA-C  ALPRAZolam Duanne Moron) 1 MG tablet Take 1 tab 3 times and day and 1/2 at bed time Patient taking differently: Take 1 mg by mouth 5 (five) times daily.  03/20/15  Yes Kathlee Nations, MD  aspirin EC 325 MG tablet Take 325 mg by mouth daily.   Yes Historical Provider, MD  atenolol (TENORMIN) 50 MG tablet Take 50 mg by mouth every morning.    Yes Historical Provider, MD  ibuprofen (ADVIL,MOTRIN) 200 MG tablet Take 200 mg by  mouth every 6 (six) hours as needed for headache, mild pain or moderate pain.   Yes Historical Provider, MD  mirtazapine (REMERON) 15 MG tablet Take 15 mg by mouth at bedtime.   Yes Historical Provider, MD  amoxicillin-clavulanate (AUGMENTIN XR) 1000-62.5 MG 12 hr tablet Take 2 tablets by mouth 2 (two) times daily. Patient not taking: Reported on 06/08/2015 06/01/15   Gustavus Bryant, MD  Cetirizine HCl (ZYRTEC ALLERGY) 10 MG CAPS Take 1 capsule (10 mg total) by mouth daily. Patient not taking: Reported on 10/20/2014 07/08/14   Jeannett Senior, PA-C  doxycycline (VIBRAMYCIN) 100 MG capsule Take 1 capsule (100 mg total) by mouth 2 (two) times daily. Patient not taking:  Reported on 06/08/2015 06/01/15   Gustavus Bryant, MD   BP 122/60 mmHg  Pulse 53  Temp(Src) 98.2 F (36.8 C) (Oral)  Resp 16  SpO2 95% Physical Exam  Constitutional: He appears well-developed and well-nourished. No distress.  HENT:  Head: Normocephalic and atraumatic.  Mouth/Throat: Oropharynx is clear and moist. No oropharyngeal exudate.  Eyes: Conjunctivae are normal. Right eye exhibits no discharge. Left eye exhibits no discharge. No scleral icterus.  Neck: Normal range of motion.  Cardiovascular: Normal rate, regular rhythm and normal heart sounds.   Pulmonary/Chest: Effort normal and breath sounds normal. No respiratory distress. He has no wheezes. He has no rales.  Abdominal: Soft. He exhibits no distension. There is no tenderness.  Musculoskeletal: Normal range of motion. He exhibits no edema or tenderness.  Moves extremities spontaneously and walks with a steady gait  Neurological: He is alert. Coordination normal.  Skin: Skin is warm and dry.  Psychiatric: His behavior is normal. His mood appears anxious.  Nursing note and vitals reviewed.   ED Course  Procedures (including critical care time) Labs Review Labs Reviewed  BASIC METABOLIC PANEL - Abnormal; Notable for the following:    BUN <5 (*)    All other components within normal limits  CBC WITH DIFFERENTIAL/PLATELET    Imaging Review Dg Chest 2 View  06/08/2015  CLINICAL DATA:  Decreased oxygen saturation today with hemoptysis over the past 2 weeks EXAM: CHEST - 2 VIEW COMPARISON:  06/01/2015 FINDINGS: Cardiac shadow is within normal limits. The lungs are well aerated bilaterally. Patchy infiltrate is again noted in the right upper lobe and relatively stable when compared with the prior exam. No new focal infiltrate is seen. No acute bony abnormality is noted. IMPRESSION: Persistent right upper lobe infiltrate. Continued followup is recommended. Electronically Signed   By: Inez Catalina M.D.   On: 06/08/2015 18:19   I  have personally reviewed and evaluated these images and lab results as part of my medical decision-making.   EKG Interpretation None      MDM   Final diagnoses:  Hemoptysis  Community acquired pneumonia   59 year old male isn't presenting for third time in 2 weeks for hemoptysis. He has been diagnosed with CAP and treated with augmentin and doxycycline. He is concerned that his pneumonia is cancer. He is a smoker and states that he is unwilling to quit. VSS. Oxygen remains above 95% during entire stay in emergency department. He is nontoxic, nonseptic appearing. Lungs are clear to auscultation bilaterally without adventitious lung sounds. Patient is extremely anxious about possibility that this is cancer and repeatedly asks "are you sure it's pneumonia not cancer ". Blood work unremarkable. CXR with persistent RUL infiltrate with continued recommendation for follow up imaging in 6-8 weeks. Discussed patient with critical care/pulm who recommends outpatient  follow up appointment. Pt has seen Dr. Melvyn Novas in the past and is instructed to schedule a follow up appointment with him. Pt states understanding. Encouraged to finish out abx treatment. Return precautions given in discharge paperwork and discussed with pt at bedside. Pt stable for discharge     Josephina Gip, PA-C 06/08/15 2021  Sherwood Gambler, MD 06/09/15 0001

## 2015-06-11 ENCOUNTER — Encounter: Payer: Self-pay | Admitting: Adult Health

## 2015-06-11 ENCOUNTER — Ambulatory Visit (INDEPENDENT_AMBULATORY_CARE_PROVIDER_SITE_OTHER): Payer: BC Managed Care – PPO | Admitting: Adult Health

## 2015-06-11 VITALS — BP 124/80 | HR 65 | Temp 97.9°F | Ht 71.0 in | Wt 189.0 lb

## 2015-06-11 DIAGNOSIS — J189 Pneumonia, unspecified organism: Secondary | ICD-10-CM

## 2015-06-11 DIAGNOSIS — R042 Hemoptysis: Secondary | ICD-10-CM | POA: Diagnosis not present

## 2015-06-11 DIAGNOSIS — R918 Other nonspecific abnormal finding of lung field: Secondary | ICD-10-CM | POA: Diagnosis not present

## 2015-06-11 NOTE — Assessment & Plan Note (Signed)
Hemoptysis ? CAP related  Plan  Hold Aspirin for 5 days then can restart  Finish Augmentin and Doxycycline .  Mucinex DM Twice daily  As needed  Cough/congestion .  Work to quit smoking.  Follow up CT chest in 1 year.  Follow up chest xray in 2-3 weeks and As needed   Please contact office for sooner follow up if symptoms do not improve or worsen or seek emergency care

## 2015-06-11 NOTE — Progress Notes (Signed)
Subjective:    Patient ID: Roger [name], male    DOB: [birth year]   MRN: [id number]    Brief patient profile:  52  yowm active smoker referred to pulmonary clinic 03/17/2014 for new onset hemoptysis by Dr Dorthy Cooler.    History of Present Illness  03/17/2014 1st [facility] office visit/ [facility]   Chief Complaint  Patient presents with  . Pulmonary Consult    Referred by Dr. Lujean Amel. Pt states that he was dxed with PNA about 1 wk ago.  He states that he started to have hemoptysis and went to ED and had CT Chest.  He is currently being txed with Levaquin, but still having minimal hemoptysis daily.   acute sneeze/ cough walking into locker room > immediately to ER dx with CAP and rx with levaquin x 7d.   No epistaxis, min chills, no fever or cp, some sore throat but resolved. Max bleeding was sev tbsp now down to streaky only and mostly in ams rec Stop all aspirin until no blood for 3 days then ok to restart baby aspirin The key is to stop smoking completely before smoking completely stops you!     03/28/2014 f/u ov/[facility] re: f/u pna with hemoptysis Chief Complaint  Patient presents with  . Followup with CXR    Cough is improving, but not resolved. Still has occ hemoptysis.   min blood, very dark specks even back on asa 81 mg daily (never stopped as rec) . No cp or sob  >>no changes   06/11/2015 Acute OV  Presents for an acute office visit . Complains of persistent hemoptysis.   Pt c/o prod cough with small amount of bloody mucus, chest congestion, sinus drainage and pressure. SOB and wheezing at times. Denies any chest tightness, fever, nausea or vomiting.  Has been to ER x 4 in last 2 months for hemoptysis  Chest x-ray and CT chest shows a right upper lobe airspace disease, advanced emphysema, and pulmonary nodules up to 4 mm.. Tx with levaquin. Then changed to augmentin and doxycyline . Last dose today.  Had similar episode 1 year ago, sx resolved with abx. follow up chest  xray 9/25/15that showed clearing of RUL. Infiltrate.  Denies swallow issues .  Remains on aspirin '81mg'$  daily  Coughs up blood tinged mucus most days.  Still smoking. Discussed smoking cessation     Current Medications, Allergies, Complete Past Medical History, Past Surgical His tory, Family History, and Social History were reviewed in [employer] record.  ROS  The following are not active complaints unless bolded sore throat, dysphagia, dental problems, itching, sneezing,  nasal congestion or excess/ purulent secretions, ear ache,   fever, chills, sweats, unintended wt loss, pleuritic or exertional cp, hemoptysis,  orthopnea pnd or leg swelling, presyncope, palpitations, heartburn, abdominal pain, anorexia, nausea, vomiting, diarrhea  or change in bowel or urinary habits, change in stools or urine, dysuria,hematuria,  rash, arthralgias, visual complaints, headache, numbness weakness or ataxia or problems with walking or coordination,  change in mood/affect or memory.                         Objective:   Physical Exam  amb wm nad top dentures/ partials bottom  03/28/2014         192  >>189 06/11/2015      HEENT: nl dentition, turbinates, and orophanx. Nl external ear canals without cough reflex   NECK :  without  JVD/Nodes/TM/ nl carotid upstrokes bilaterally   LUNGS: no acc muscle use, clear to A and P bilaterally without cough on insp or exp maneuvers   CV:  RRR  no s3 or murmur or increase in P2, no edema   ABD:  soft and nontender with nl excursion in the supine position. No bruits or organomegaly, bowel sounds nl  MS:  warm without deformities, calf tenderness, cyanosis or clubbing  SKIN: warm and dry without lesions    NEURO:  alert, approp, no deficits      CXR 06/08/15  Persistent right upper lobe infiltrate. Continued followup is Recommended.   CT chest 06/01/15  Right upper lobe airspace disease. Distribution and morphology favor  infection. 2. Advanced centrilobular emphysema with pulmonary nodules up to 4 mm. Given risk factors for bronchogenic carcinoma, follow-up chest CT       Assessment & Plan:

## 2015-06-11 NOTE — Assessment & Plan Note (Signed)
RUL CAP  Needs follow up cxr   Plan  Hold Aspirin for 5 days then can restart  Finish Augmentin and Doxycycline .  Mucinex DM Twice daily  As needed  Cough/congestion .  Work to quit smoking.  Follow up CT chest in 1 year.  Follow up chest xray in 2-3 weeks and As needed   Please contact office for sooner follow up if symptoms do not improve or worsen or seek emergency care

## 2015-06-11 NOTE — Assessment & Plan Note (Signed)
Need follow up CT chest in 1 year

## 2015-06-11 NOTE — Progress Notes (Signed)
Chart and office note reviewed in detail along with available xrays/ labs > agree with a/p as outlined > prob needs fob with bal RUL r/o atypical tb

## 2015-06-11 NOTE — Patient Instructions (Addendum)
Hold Aspirin for 5 days then can restart  Finish Augmentin and Doxycycline .  Mucinex DM Twice daily  As needed  Cough/congestion .  Work to quit smoking.  Follow up CT chest in 1 year.  Follow up chest xray in 2-3 weeks and As needed  With Dr. Melvyn Novas   Please contact office for sooner follow up if symptoms do not improve or worsen or seek emergency care

## 2015-06-11 NOTE — Addendum Note (Signed)
Addended by: Osa Craver on: 06/11/2015 03:40 PM   Modules accepted: Orders

## 2015-06-12 ENCOUNTER — Telehealth: Payer: Self-pay | Admitting: Adult Health

## 2015-06-12 NOTE — Telephone Encounter (Signed)
Called and spoke with patient Pt stated that he had an OV with TP on 06/11/2015 and during the exam he was asked if he used oxygen Pt states today that the questioned bothered him and left wondering if he should be on oxygen since provider asked him this Explained to pt that this is a general question that we ask all patients during exam and this does not mean that he needs to use oxygen at this time. Pt states that now he understands and just wanted to be sure  Nothing further is needed at this time.

## 2015-06-18 ENCOUNTER — Ambulatory Visit (HOSPITAL_COMMUNITY): Payer: Self-pay | Admitting: Psychiatry

## 2015-07-13 ENCOUNTER — Encounter: Payer: Self-pay | Admitting: Internal Medicine

## 2015-07-13 ENCOUNTER — Ambulatory Visit (INDEPENDENT_AMBULATORY_CARE_PROVIDER_SITE_OTHER)
Admission: RE | Admit: 2015-07-13 | Discharge: 2015-07-13 | Disposition: A | Discharge status: Home / Self Care | Payer: BC Managed Care – PPO | Source: Ambulatory Visit | Attending: Internal Medicine | Admitting: Internal Medicine

## 2015-07-13 ENCOUNTER — Ambulatory Visit (INDEPENDENT_AMBULATORY_CARE_PROVIDER_SITE_OTHER): Payer: BC Managed Care – PPO | Admitting: Internal Medicine

## 2015-07-13 VITALS — BP 124/88 | HR 63 | Ht 71.75 in | Wt 195.6 lb

## 2015-07-13 DIAGNOSIS — J449 Chronic obstructive pulmonary disease, unspecified: Secondary | ICD-10-CM | POA: Insufficient documentation

## 2015-07-13 DIAGNOSIS — F17210 Nicotine dependence, cigarettes, uncomplicated: Secondary | ICD-10-CM

## 2015-07-13 DIAGNOSIS — R042 Hemoptysis: Secondary | ICD-10-CM

## 2015-07-13 DIAGNOSIS — R918 Other nonspecific abnormal finding of lung field: Secondary | ICD-10-CM

## 2015-07-13 DIAGNOSIS — Z720 Tobacco use: Secondary | ICD-10-CM

## 2015-07-13 NOTE — Assessment & Plan Note (Signed)
PFT's  11/09/2013  FEV1 3.38 (93 % ) ratio 71  p no % improvement from saba with DLCO  64 % corrects to 69 % for alv volume    Does not actually meet criteria for copd based on this study from 2015 but has been actively smoking - (see separate a/p)   No indication for maint rx / f/u for this can be prn

## 2015-07-13 NOTE — Assessment & Plan Note (Signed)

## 2015-07-13 NOTE — Assessment & Plan Note (Signed)
Resolved despite asa 325 mg per day and no def as changes now in RUL > f/u prn flare on just the baby asa daily / advised   I had an extended discussion with the patient reviewing all relevant studies completed to date and  lasting 15 to 20 minutes of a 25 minute visit    Each maintenance medication was reviewed in detail including most importantly the difference between maintenance and prns and under what circumstances the prns are to be triggered using an action plan format that is not reflected in the computer generated alphabetically organized AVS.    Please see instructions for details which were reviewed in writing and the patient given a copy highlighting the part that I personally wrote and discussed at today's ov.

## 2015-07-13 NOTE — Patient Instructions (Addendum)
Change aspirin to a baby aspirin daily    You don't have any significant copd now and you never will if you stop smoking at this point  We will contact you for your follow up CT chest in November 2017 - call us in meantime if your condition worsens

## 2015-07-13 NOTE — Assessment & Plan Note (Signed)
See CT chest 06/01/15 x 4 mm > reminder file for 05/31/16   Doubt any relationship between this tiny nodule and the hemoptysis/ as dz in RUL > did remind to use baby asa and f/u as planned with repeat CT chest 05/31/16   CT results reviewed with pt >>> Too small for PET or bx, not suspicious enough for excisional bx > really only option for now is follow the Fleischner society guidelines as rec by radiology = 12 m  Discussed in detail all the  indications, usual  risks and alternatives  relative to the benefits with patient who agrees to proceed with conservative f/u as outlined

## 2015-07-13 NOTE — Progress Notes (Signed)
Subjective:    Patient ID: Roger [name], male    DOB: Nov 14, 1955     MRN: [id number]    Brief patient profile:  68 yowm active smoker referred to pulmonary clinic 03/17/2014 for new onset hemoptysis by Dr Dorthy Cooler with documented GOLD 0 copd 11/2013    History of Present Illness  03/17/2014 1st [facility] office visit/ Roger [name]   Chief Complaint  Patient presents with  . Pulmonary Consult    Referred by Dr. Lujean Amel. Pt states that he was dxed with PNA about 1 wk ago.  He states that he started to have hemoptysis and went to ED and had CT Chest.  He is currently being txed with Levaquin, but still having minimal hemoptysis daily.   acute sneeze/ cough walking into locker room > immediately to ER dx with CAP and rx with levaquin x 7d.   No epistaxis, min chills, no fever or cp, some sore throat but resolved. Max bleeding was sev tbsp now down to streaky only and mostly in ams rec Stop all aspirin until no blood for 3 days then ok to restart baby aspirin The key is to stop smoking completely before smoking completely stops you!     03/28/2014 f/u ov/Roger [name] re: f/u pna with hemoptysis Chief Complaint  Patient presents with  . Followup with CXR    Cough is improving, but not resolved. Still has occ hemoptysis.   min blood, very dark specks even back on asa 81 mg daily (never stopped as rec) . No cp or sob  >>no changes   06/11/2015 NP Acute OV  Presents for an acute office visit . Complains of persistent hemoptysis.   Pt c/o prod cough with small amount of bloody mucus, chest congestion, sinus drainage and pressure. SOB and wheezing at times. Denies any chest tightness, fever, nausea or vomiting.  Has been to ER x 4 in last 2 months for hemoptysis  Chest x-ray and CT chest shows a right upper lobe airspace disease, advanced emphysema, and pulmonary nodules up to 4 mm.. Tx with levaquin. Then changed to augmentin and doxycyline . Last dose today.  Had similar episode 1 year ago, sx  resolved with abx. follow up chest xray 03/28/14 that showed clearing of RUL. Infiltrate.  Denies swallow issues .  Remains on aspirin '81mg'$  daily  Coughs up blood tinged mucus most days.  Still smoking. Discussed smoking cessation  rec Hold Aspirin for 5 days then can restart  Finish Augmentin and Doxycycline .  Mucinex DM Twice daily  As needed  Cough/congestion .  Work to quit smoking.  Follow up CT chest in 1 year.  Follow up chest xray in 2-3 weeks and As needed  With Dr. Melvyn Novas       07/13/2015  f/u ov/Roger [name] re: hemoptysis/ still smoking with  GOLD 0 COPD  Chief Complaint  Patient presents with  . Follow-up    CXR done. Pt states cough has improved- has not had any more hemoptysis. He does produce some occ clear sputum.   says doesn't remember being told re asa (was done in writing) and actually takes 325 mg and not the baby asa and never changed it even when actively bleeding.  Not limited by breathing from desired activities.  No obvious day to day or daytime variability or assoc  cp or chest tightness, subjective wheeze or overt sinus or hb symptoms. No unusual exp hx or h/o childhood pna/ asthma or knowledge of premature birth.  Sleeping ok without nocturnal  or early am exacerbation  of respiratory  c/o's or need for noct saba. Also denies any obvious fluctuation of symptoms with weather or environmental changes or other aggravating or alleviating factors except as outlined above   Current Medications, Allergies, Complete Past Medical History, Past Surgical History, Family History, and Social History were reviewed in [employer] record.  ROS  The following are not active complaints unless bolded sore throat, dysphagia, dental problems, itching, sneezing,  nasal congestion or excess/ purulent secretions, ear ache,   fever, chills, sweats, unintended wt loss, classically pleuritic or exertional cp, hemoptysis,  orthopnea pnd or leg swelling, presyncope,  palpitations, abdominal pain, anorexia, nausea, vomiting, diarrhea  or change in bowel or bladder habits, change in stools or urine, dysuria,hematuria,  rash, arthralgias, visual complaints, headache, numbness, weakness or ataxia or problems with walking or coordination,  change in mood/affect or memory.           Objective:   Physical Exam  amb wm nad top dentures/ partials bottom  03/28/2014    192  >>189 06/11/2015 >  07/13/2015  196      HEENT: nl dentition, turbinates, and orophanx. Nl external ear canals without cough reflex   NECK :  without JVD/Nodes/TM/ nl carotid upstrokes bilaterally   LUNGS: no acc muscle use, clear to A and P bilaterally without cough on insp or exp maneuvers   CV:  RRR  no s3 or murmur or increase in P2, no edema   ABD:  soft and nontender with nl excursion in the supine position. No bruits or organomegaly, bowel sounds nl  MS:  warm without deformities, calf tenderness, cyanosis or clubbing  SKIN: warm and dry without lesions    NEURO:  alert, approp, no deficits         CT chest 06/01/15  Right upper lobe airspace disease. Distribution and morphology favor infection. 2. Advanced centrilobular emphysema with pulmonary nodules up to 4 mm. Given risk factors for bronchogenic carcinoma, follow-up chest CT rec for one year    CXR PA and Lateral:   07/13/2015 :    I personally reviewed images and agree with radiology impression as follows:   Improved aeration RUL         Assessment & Plan:

## 2016-02-09 ENCOUNTER — Encounter (HOSPITAL_COMMUNITY): Payer: Self-pay | Admitting: Neurology

## 2016-02-09 ENCOUNTER — Emergency Department (HOSPITAL_COMMUNITY): Payer: BC Managed Care – PPO

## 2016-02-09 ENCOUNTER — Emergency Department (HOSPITAL_COMMUNITY)
Admission: EM | Admit: 2016-02-09 | Discharge: 2016-02-09 | Disposition: A | Discharge status: Home / Self Care | Payer: BC Managed Care – PPO | Attending: Emergency Medicine | Admitting: Emergency Medicine

## 2016-02-09 DIAGNOSIS — X58XXXA Exposure to other specified factors, initial encounter: Secondary | ICD-10-CM | POA: Diagnosis not present

## 2016-02-09 DIAGNOSIS — R1032 Left lower quadrant pain: Secondary | ICD-10-CM | POA: Diagnosis not present

## 2016-02-09 DIAGNOSIS — Y999 Unspecified external cause status: Secondary | ICD-10-CM | POA: Diagnosis not present

## 2016-02-09 DIAGNOSIS — J449 Chronic obstructive pulmonary disease, unspecified: Secondary | ICD-10-CM | POA: Diagnosis not present

## 2016-02-09 DIAGNOSIS — Y939 Activity, unspecified: Secondary | ICD-10-CM | POA: Diagnosis not present

## 2016-02-09 DIAGNOSIS — S39012A Strain of muscle, fascia and tendon of lower back, initial encounter: Secondary | ICD-10-CM | POA: Insufficient documentation

## 2016-02-09 DIAGNOSIS — F17210 Nicotine dependence, cigarettes, uncomplicated: Secondary | ICD-10-CM | POA: Insufficient documentation

## 2016-02-09 DIAGNOSIS — I10 Essential (primary) hypertension: Secondary | ICD-10-CM | POA: Diagnosis not present

## 2016-02-09 DIAGNOSIS — Y929 Unspecified place or not applicable: Secondary | ICD-10-CM | POA: Insufficient documentation

## 2016-02-09 DIAGNOSIS — S3992XA Unspecified injury of lower back, initial encounter: Secondary | ICD-10-CM | POA: Diagnosis present

## 2016-02-09 LAB — COMPREHENSIVE METABOLIC PANEL
ALBUMIN: 3.9 g/dL (ref 3.5–5.0)
ALK PHOS: 97 U/L (ref 38–126)
ALT: 23 U/L (ref 17–63)
ANION GAP: 7 (ref 5–15)
AST: 24 U/L (ref 15–41)
BUN: 10 mg/dL (ref 6–20)
CALCIUM: 9.5 mg/dL (ref 8.9–10.3)
CO2: 25 mmol/L (ref 22–32)
Chloride: 103 mmol/L (ref 101–111)
Creatinine, Ser: 0.81 mg/dL (ref 0.61–1.24)
GFR calc Af Amer: >60 mL/min (ref >60)
GFR calc non Af Amer: >60 mL/min (ref >60)
GLUCOSE: 179 mg/dL — AB (ref 65–99)
POTASSIUM: 3.9 mmol/L (ref 3.5–5.1)
SODIUM: 135 mmol/L (ref 135–145)
Total Bilirubin: 0.6 mg/dL (ref 0.3–1.2)
Total Protein: 7.6 g/dL (ref 6.5–8.1)

## 2016-02-09 LAB — URINALYSIS, ROUTINE W REFLEX MICROSCOPIC
BILIRUBIN URINE: NEGATIVE
GLUCOSE, UA: 250 mg/dL — AB
HGB URINE DIPSTICK: NEGATIVE
Ketones, ur: NEGATIVE mg/dL
Leukocytes, UA: NEGATIVE
Nitrite: NEGATIVE
PH: 6 (ref 5.0–8.0)
Protein, ur: NEGATIVE mg/dL
SPECIFIC GRAVITY, URINE: 1.01 (ref 1.005–1.030)

## 2016-02-09 LAB — CBC
HEMATOCRIT: 46.1 % (ref 39.0–52.0)
HEMOGLOBIN: 16 g/dL (ref 13.0–17.0)
MCH: 32.1 pg (ref 26.0–34.0)
MCHC: 34.7 g/dL (ref 30.0–36.0)
MCV: 92.4 fL (ref 78.0–100.0)
Platelets: 190 10*3/uL (ref 150–400)
RBC: 4.99 MIL/uL (ref 4.22–5.81)
RDW: 12.2 % (ref 11.5–15.5)
WBC: 8.1 10*3/uL (ref 4.0–10.5)

## 2016-02-09 MED ORDER — METHOCARBAMOL 500 MG PO TABS
500.0000 mg | ORAL_TABLET | Freq: Two times a day (BID) | ORAL | 0 refills | Status: DC
Start: 2016-02-09 — End: 2016-07-30

## 2016-02-09 NOTE — ED Notes (Signed)
D/C instructions reviewed with patient. Pt. Reports decrease in pain and denies any farther questions

## 2016-02-09 NOTE — Discharge Instructions (Signed)
Please read and follow all provided instructions.  Your diagnoses today include:  1. Low back strain, initial encounter    Tests performed today include: Vital signs. See below for your results today.   Medications prescribed:  Take as prescribed   Home care instructions:  Follow any educational materials contained in this packet.  Follow-up instructions: Please follow-up with your primary care provider for further evaluation of symptoms and treatment   Return instructions:  Please return to the Emergency Department if you do not get better, if you get worse, or new symptoms OR  - Fever (temperature greater than 101.82F)  - Bleeding that does not stop with holding pressure to the area    -Severe pain (please note that you may be more sore the day after your accident)  - Chest Pain  - Difficulty breathing  - Severe nausea or vomiting  - Inability to tolerate food and liquids  - Passing out  - Skin becoming red around your wounds  - Change in mental status (confusion or lethargy)  - New numbness or weakness    Please return if you have any other emergent concerns.  Additional Information:  Your vital signs today were: BP 132/69 (BP Location: Right Arm)    Pulse (!) 53    Temp 98.4 F (36.9 C) (Oral)    Resp 18    Ht 5' 11.75" (1.822 m)    Wt 83.7 kg    SpO2 97%    BMI 25.19 kg/m  If your blood pressure (BP) was elevated above 135/85 this visit, please have this repeated by your doctor within one month. ---------------

## 2016-02-09 NOTE — ED Provider Notes (Signed)
Roger [name]   CSN: [id number] Arrival date & time: 02/09/16  7902  First Provider Contact:  None       History   Chief Complaint Chief Complaint  Patient presents with  . Back Pain   HPI Roger [name] is a 60 y.o. male.  HPI  60 y.o. male with a hx of COPD, HTN, presents to the Emergency Department today complaining of left lower back pain x 2 weeks. States pain feels like a dull ache and radiates into LLQ. Pain is 6/10. [name], pt thinks he passed kidney stone on Saturday, but pain has continued. No trauma to area. No lifting injuries that he knows of. Seen by PCP x2 and told it was  Kidney stone. No N/V/D. No fevers. Pt urinated this morning, but states that he is having trouble emptying his bladder. Has been taking advil for pain with minimal relief. No hx stones. Having normal BMs. No other symptoms noted.    Past Medical History:  Diagnosis Date  . Anxiety   . Asthma   . COPD (chronic obstructive pulmonary disease) ([place])   . Depression   . Hemorrhoids   . History of rectal bleeding   . Hypertension   . Pneumonia    in 2015  . Post-operative nausea and vomiting     Patient Active Problem List   Diagnosis Date Noted  . COPD GOLD 0 still smoking  07/13/2015  . Hemoptysis 06/11/2015  . CAP (community acquired pneumonia) 06/11/2015  . Lung nodules 06/11/2015  . Cigarette smoker 03/19/2014  . Generalized anxiety disorder 11/09/2011    Past Surgical History:  Procedure Laterality Date  . DENTAL SURGERY    . NOSE SURGERY    . TONSILLECTOMY         Home Medications    Prior to Admission medications   Medication Sig Start Date End Date Taking? Authorizing Provider  albuterol (PROVENTIL HFA;VENTOLIN HFA) 108 (90 BASE) MCG/ACT inhaler Inhale 1-2 puffs into the lungs every 6 (six) hours as needed for wheezing or shortness of breath. 04/13/15   Okey Regal, PA-C  ALPRAZolam Duanne Moron) 1 MG tablet Take 1 tab 3 times and day and 1/2 at bed  time Patient taking differently: Take 1 mg by mouth 5 (five) times daily.  03/20/15   Kathlee Nations, MD  atenolol (TENORMIN) 50 MG tablet Take 50 mg by mouth every morning.     Historical Provider, MD  guaiFENesin (MUCINEX) 600 MG 12 hr tablet Take 600 mg by mouth 2 (two) times daily as needed.    Historical Provider, MD  ibuprofen (ADVIL,MOTRIN) 200 MG tablet Take 200 mg by mouth every 6 (six) hours as needed for headache, mild pain or moderate pain.    Historical Provider, MD  mirtazapine (REMERON) 15 MG tablet Take 15 mg by mouth at bedtime.    Historical Provider, MD    Family History Family History  Problem Relation Age of Onset  . Anxiety disorder Mother   . Depression Mother   . Lung disease Mother     "arthritic lung"  . Anxiety disorder Sister   . Depression Sister   . Anxiety disorder Brother   . Lung disease Father   . Lung cancer Father   . Anxiety disorder Sister   . Anxiety disorder Brother   . Anxiety disorder Brother   . Anxiety disorder Brother   . Colon cancer Brother     Social History Social History  Substance Use Topics  .  Smoking status: Current Every Day Smoker    Packs/day: 0.50    Years: 25.00    Types: Cigarettes  . Smokeless tobacco: Never Used  . Alcohol use No     Allergies   Demerol; Erythromycin; Prednisone; and Sulfa antibiotics   Review of Systems Review of Systems ROS reviewed and all are negative for acute change except as noted in the HPI.  Physical Exam Updated Vital Signs BP 149/70 (BP Location: Left Arm)   Pulse 66   Temp 98.4 F (36.9 C) (Oral)   Resp 20   Ht 5' 11.75" (1.822 m)   Wt 83.7 kg   SpO2 97%   BMI 25.19 kg/m   Physical Exam  Constitutional: He is oriented to person, place, and time. Vital signs are normal. He appears well-developed and well-nourished.  HENT:  Head: Normocephalic and atraumatic.  Eyes: EOM and lids are normal. Pupils are equal, round, and reactive to light.  Neck: Trachea normal and  normal range of motion. Neck supple.  Cardiovascular: Normal rate, regular rhythm, S1 normal, S2 normal, normal heart sounds, intact distal pulses and normal pulses.   Pulmonary/Chest: Effort normal and breath sounds normal.  Abdominal: Soft. Normal appearance and bowel sounds are normal. There is tenderness in the left lower quadrant. There is CVA tenderness (left).  Musculoskeletal:       Lumbar back: Normal. He exhibits normal range of motion.  Neurological: He is alert and oriented to person, place, and time.  Skin: Skin is warm and dry.  Psychiatric: His speech is normal.  Vitals reviewed.  ROS reviewed and all are negative for acute change except as noted in the HPI.  ED Treatments / Results  Labs (all labs ordered are listed, but only abnormal results are displayed) Labs Reviewed  COMPREHENSIVE METABOLIC PANEL - Abnormal; Notable for the following:       Result Value   Glucose, Bld 179 (*)    All other components within normal limits  URINALYSIS, ROUTINE W REFLEX MICROSCOPIC (NOT AT [facility]) - Abnormal; Notable for the following:    Glucose, UA 250 (*)    All other components within normal limits  CBC    EKG  EKG Interpretation None       Radiology Ct Renal Stone Study  Result Date: 02/09/2016 CLINICAL DATA:  Upper and lower LEFT back pain for 2 weeks question passed a kidney stone on Saturday but pain continues, trouble emptying bladder, history of asthma, hypertension, COPD, smoking EXAM: CT ABDOMEN AND PELVIS WITHOUT CONTRAST TECHNIQUE: Multidetector CT imaging of the abdomen and pelvis was performed following the standard protocol without IV contrast. Sagittal and coronal MPR images reconstructed from axial data set. Oral contrast not administered for this indication. COMPARISON:  None FINDINGS: Lower chest: Emphysematous changes at lung bases with minimal scarring at RIGHT lower lobe. Hepatobiliary: Liver and gallbladder normal appearance Pancreas: Normal appearance  Spleen: Normal appearance Adrenals/Urinary Tract: Adrenal glands normal appearance. Kidneys, ureters, and bladder normal appearance. No urinary tract calcification or dilatation. Prostate gland 4.6 x 3.3 cm image 83. Stomach/Bowel: Normal appendix. Stomach decompressed. Few uncomplicated distal colonic diverticula. Bowel loops otherwise normal appearance. Vascular/Lymphatic: Aorta normal caliber. Scattered atherosclerotic calcifications. No adenopathy. Reproductive: N/A Other: No free air, free fluid, hernia or definite inflammatory process Musculoskeletal: Osseous structures unremarkable. IMPRESSION: Minimal distal colonic diverticulosis without evidence of diverticulitis. Mild prostatic enlargement. Emphysematous changes at lung bases. Aortic atherosclerosis. No acute abnormalities identified Electronically Signed   By: Crist Infante.D.  On: 02/09/2016 13:30    Procedures Procedures (including critical care time)  Medications Ordered in ED Medications - No data to display   Initial Impression / Assessment and Plan / ED Course  I have reviewed the triage vital signs and the nursing notes.  Pertinent labs & imaging results that were available during my care of the patient were reviewed by me and considered in my medical decision making (see chart for details).  Clinical Course    Final Clinical Impressions(s) / ED Diagnoses  I have reviewed and evaluated the relevant laboratory valuesI have reviewed and evaluated the relevant imaging studies.  I have reviewed the relevant previous healthcare records.I obtained HPI from historian.  ED Course:  Assessment: Pt is a 59yM who presents with left flank pain x 2 weeks with radiation in left groin. Seen by [facility] and told it was kidney stone. Pt thinks he passed stone on Saturday due to symptom improvement. No fevers. On exam, pt in NAD. Nontoxic/nonseptic appearing. VSS. Afebrile. Lungs CTA. Heart RRR. Abdomen with mild TTP on LLQ. CVA tenderness on  left. No spinous process tenderness. Pt pain is palpable in lower back musculature. Labs unremarkable. UA unremarkable. CT Renal unremarkable for stone. Possible musculoskeletal etiology. Given Rx muscle relaxant. Plan is to DC home with follow up to PCP. At time of discharge, Patient is in no acute distress. Vital Signs are stable. Patient is able to ambulate. Patient able to tolerate PO.    Disposition/Plan:  DC Home Additional Verbal discharge instructions given and discussed with patient.  Pt Instructed to f/u with PCP in the next week for evaluation and treatment of symptoms. Return precautions given Pt acknowledges and agrees with plan  Supervising Physician Tanna Furry, MD   Final diagnoses:  Low back strain, initial encounter    New Prescriptions New Prescriptions   No medications on file     Shary Decamp, PA-C 02/09/16 Justin, MD 02/16/16 1115

## 2016-02-09 NOTE — ED Triage Notes (Addendum)
Pt reports upper and lower left back pain for 2 week, thinks you passed kidney stone on Saturday but pain continues. Also having trouble emptying bladder. Denies n/v/d. Is drinking water, urinated this morning. Is a x 4. Is taking advil for pain. No hx of kidney stones until recent. Also reports LLQ abd pain.

## 2016-02-23 IMAGING — CR DG CHEST 2V
2 series · 2 of 2 positions shown · non-contrast
Comparison: 04/13/2015

CLINICAL DATA: Bloody sputum, worsening.  COPD.

EXAM:
CHEST  2 VIEW

[chest pa]
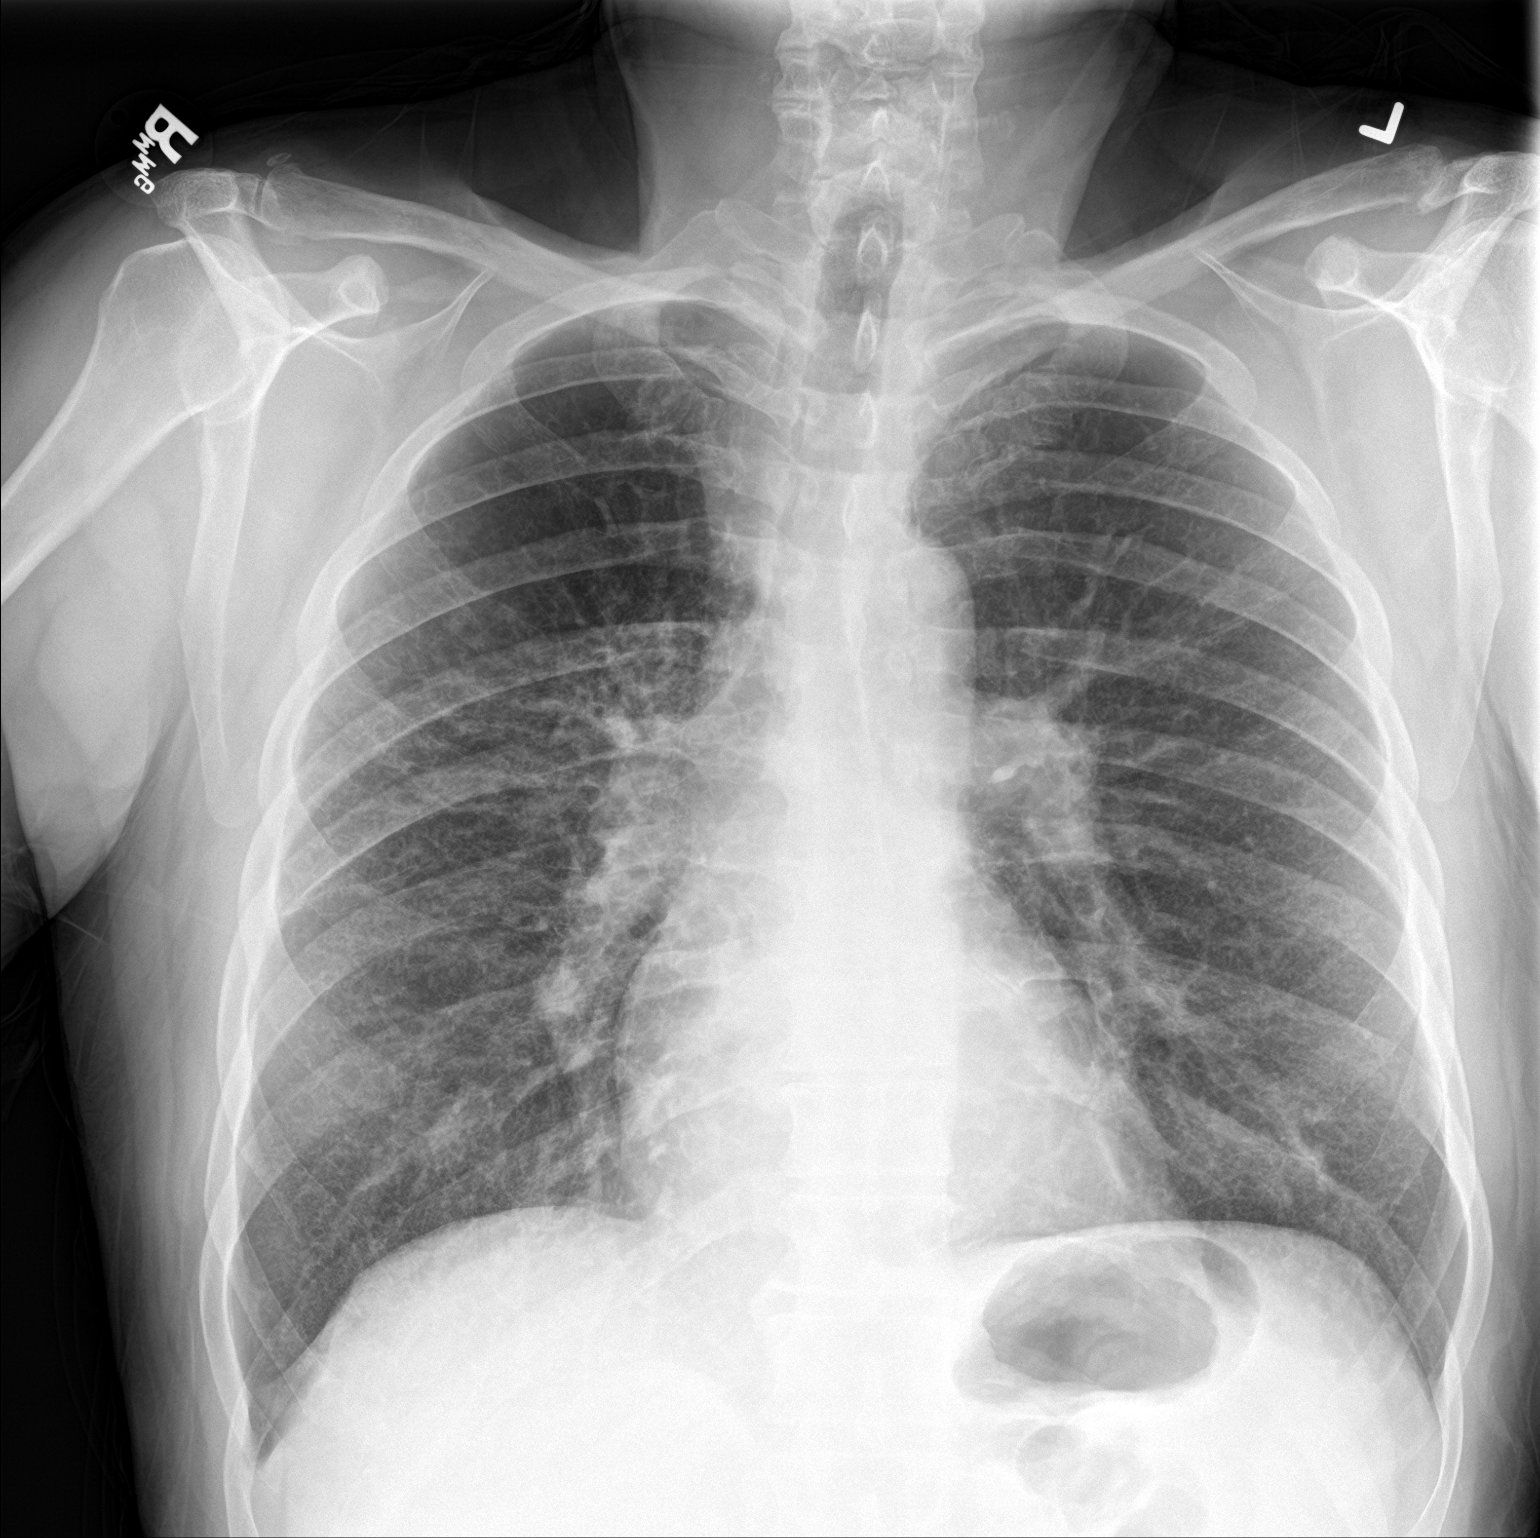

[chest lat]
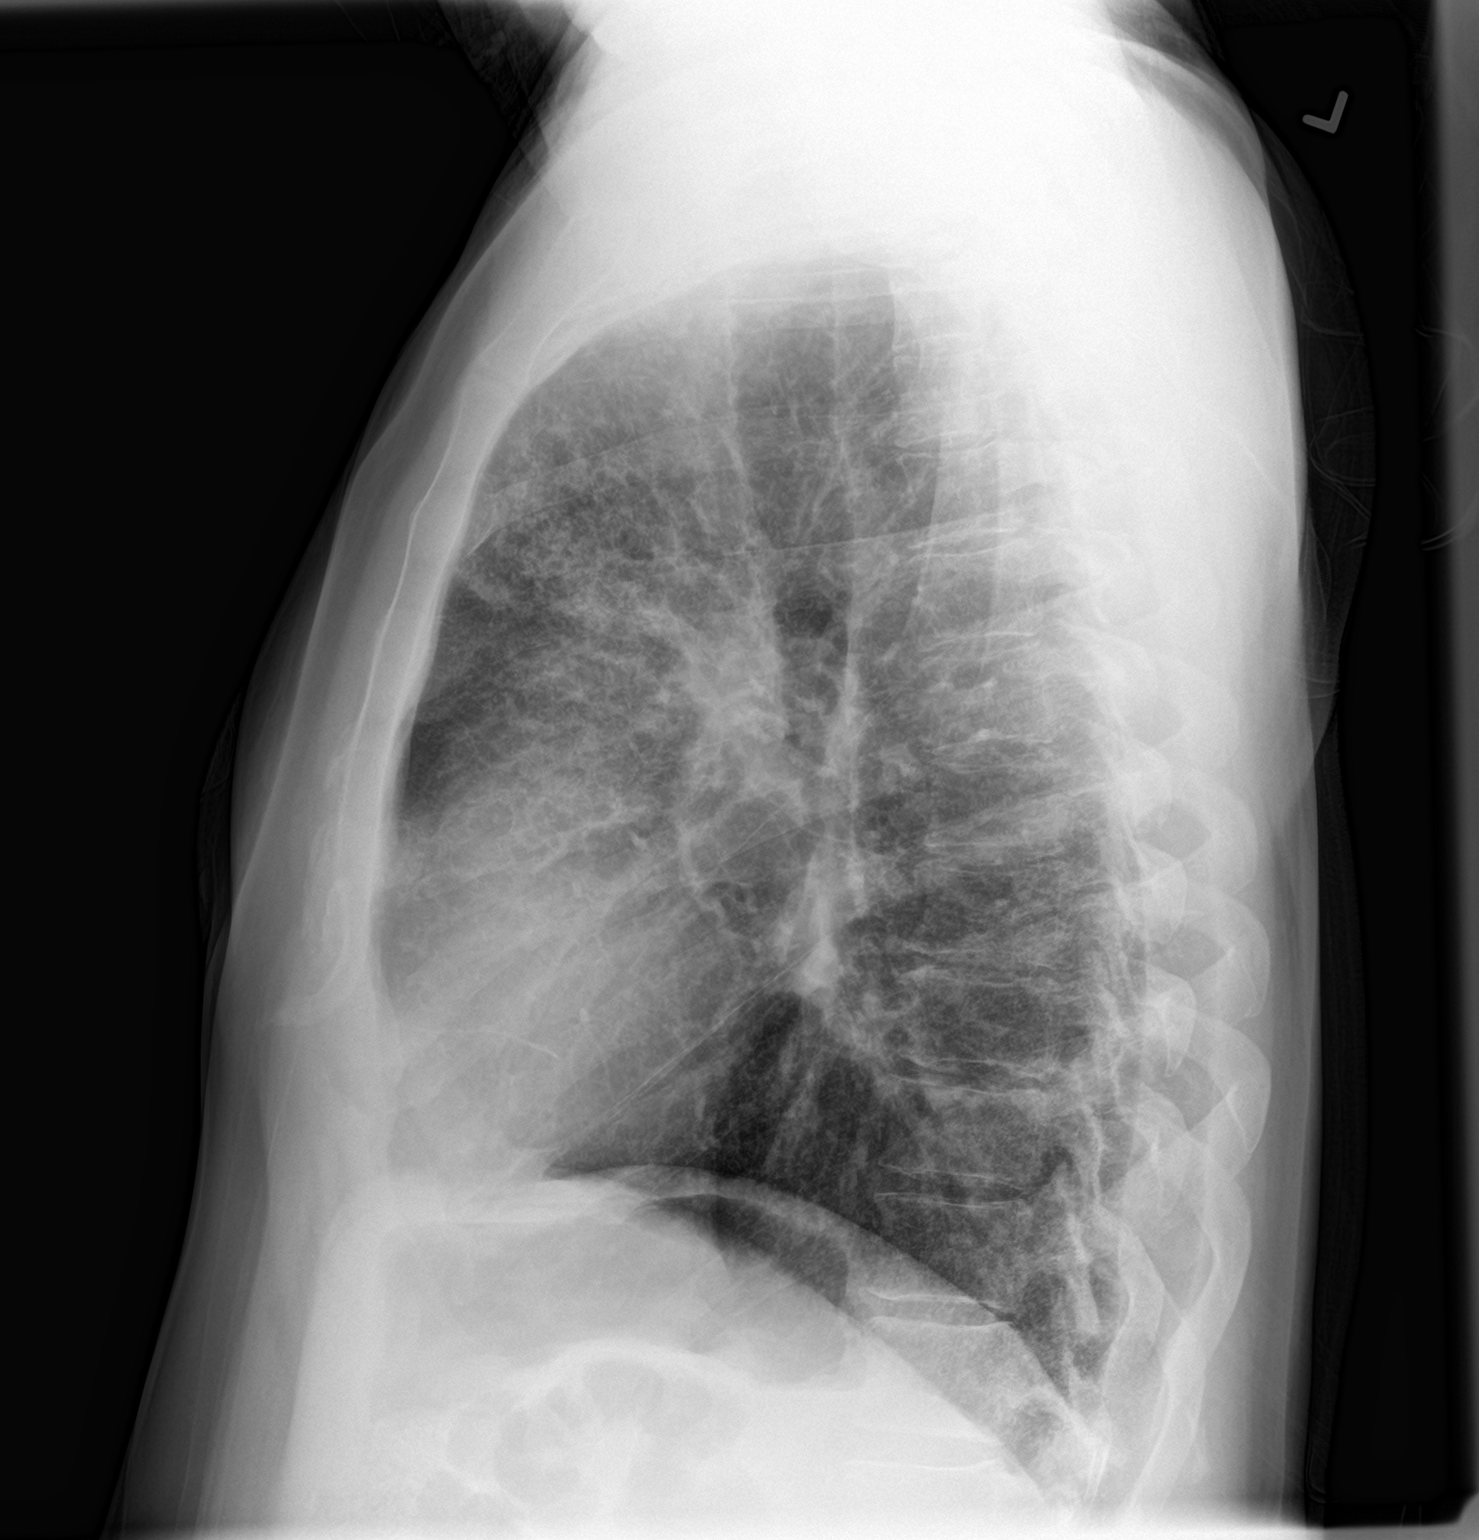

[2 of 2 positions shown; findings below may reference images not displayed]

FINDINGS: Severe emphysema. Increased interstitial accentuation anteriorly in
the right upper lobe. But chronic hilar prominence. No paratracheal
adenopathy identified.

No pleural effusion.
IMPRESSION: 1. Increased interstitial accentuation and vague opacity anteriorly
in the right upper lobe, probably from recurrent pneumonia, and
increased from 04/13/2015.
2. Severe emphysema.

## 2016-03-02 ENCOUNTER — Ambulatory Visit: Payer: Self-pay | Admitting: Dietician

## 2016-03-22 ENCOUNTER — Ambulatory Visit: Payer: Self-pay | Admitting: Dietician

## 2016-04-11 IMAGING — DX DG CHEST 2V
2 series · 2 of 2 positions shown · non-contrast
Comparison: 06/08/2015, 06/01/2015

CLINICAL DATA: Cough, hemoptysis, smoker, COPD

EXAM:
CHEST  2 VIEW

[chest pa]
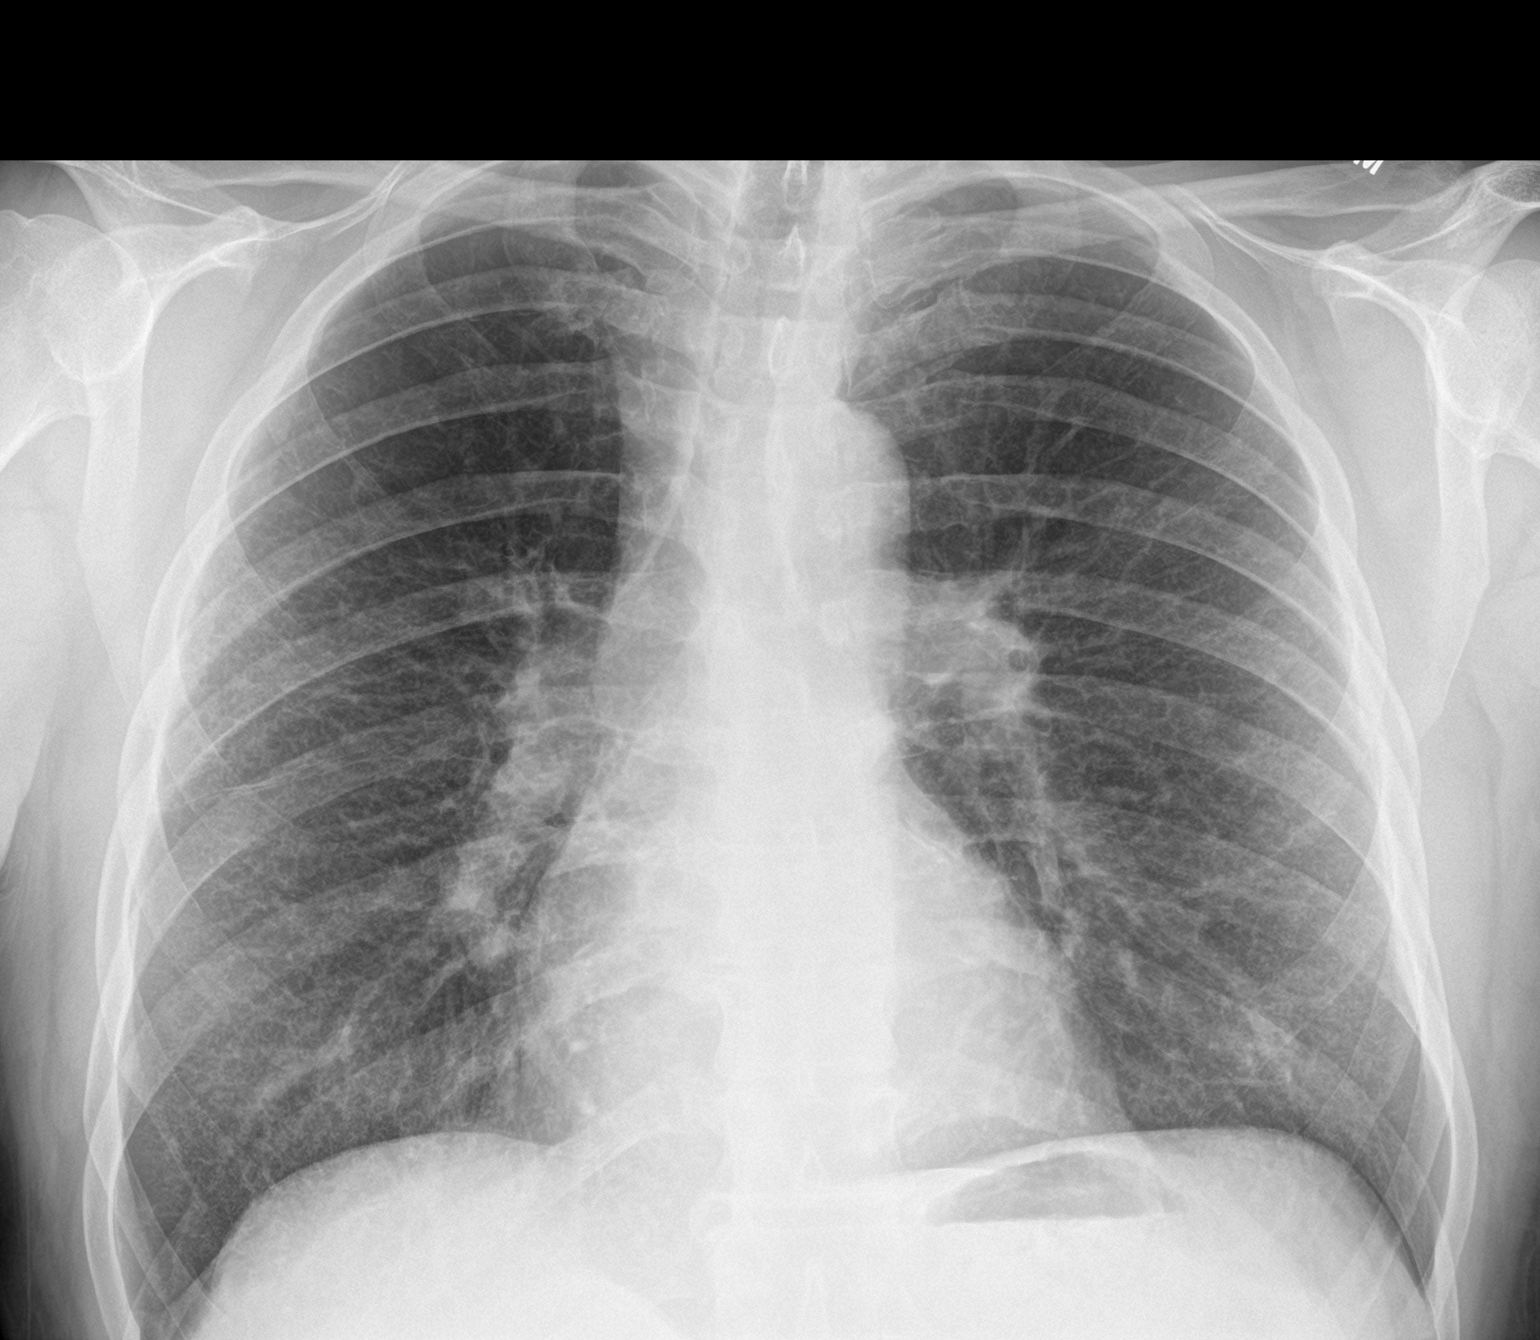

[chest lat]
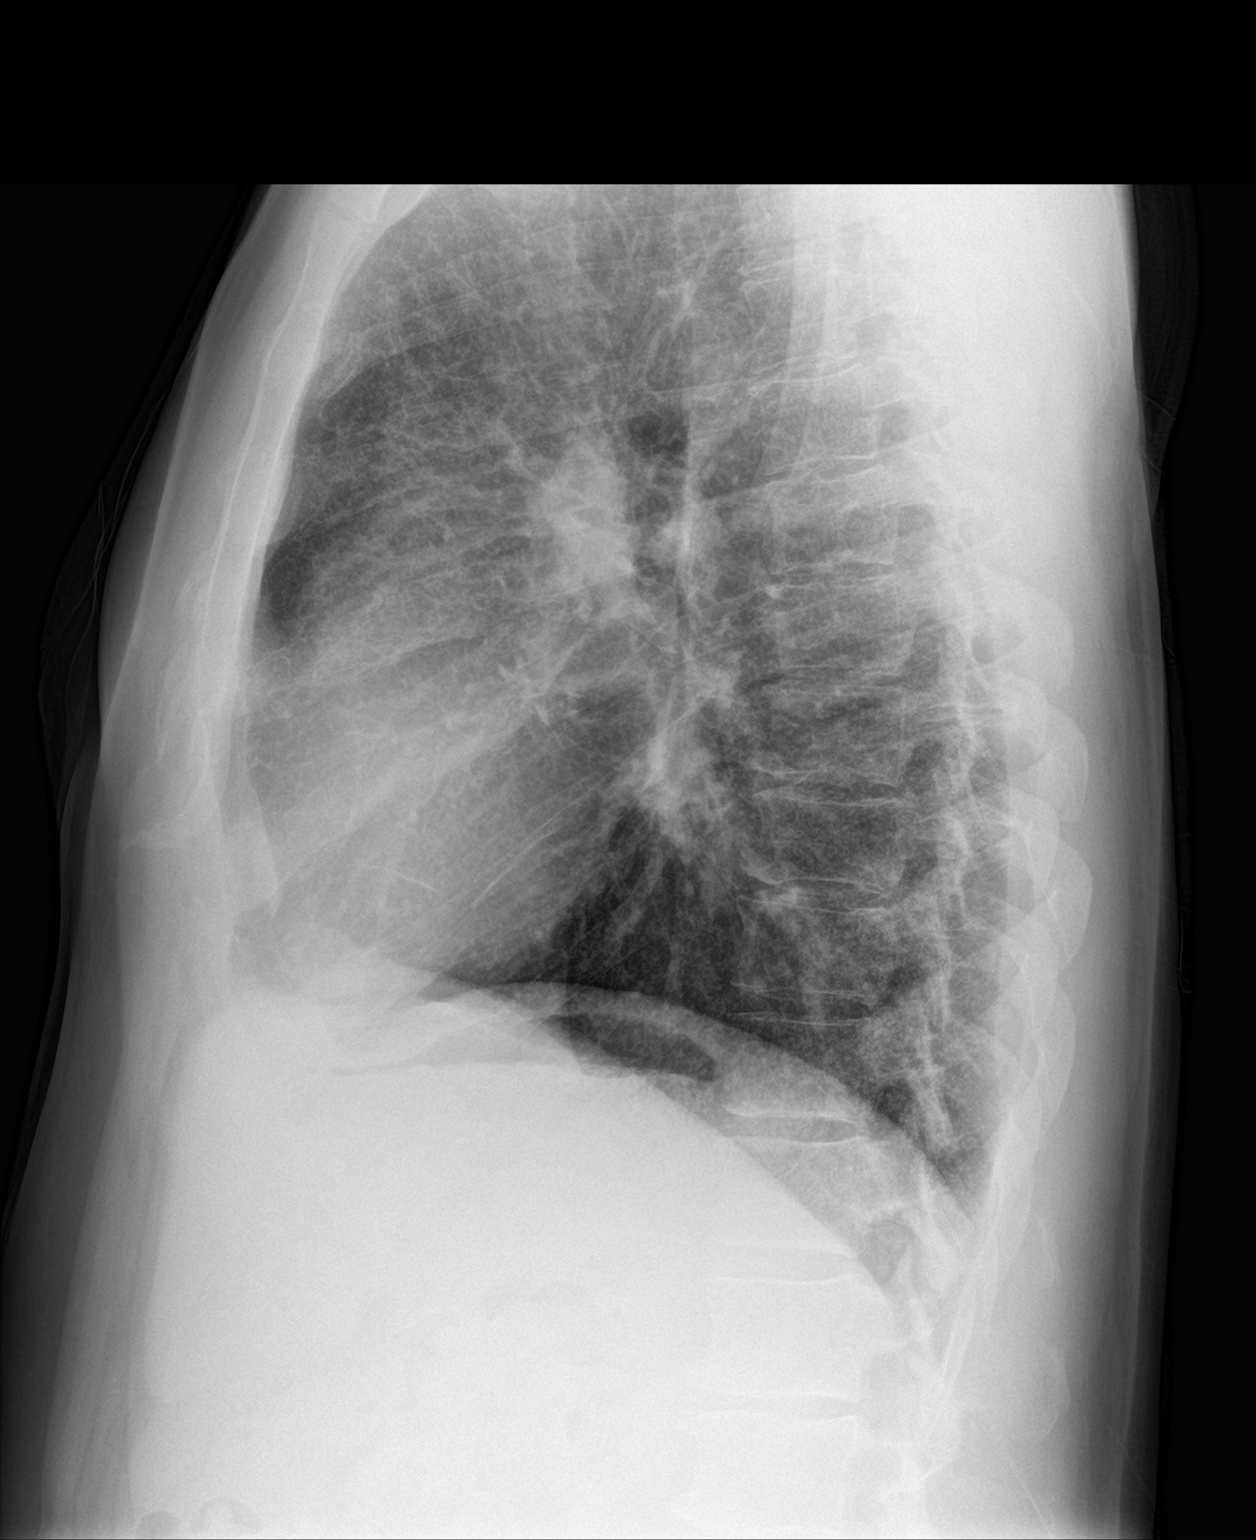

[2 of 2 positions shown; findings below may reference images not displayed]

FINDINGS: Background hyperinflation with parenchymal scarring compatible with
COPD/ emphysema. Slight improvement in the ill-defined patchy
reticular nodular opacity throughout the right upper lobe. No new
airspace process, collapse or consolidation. No superimposed edema,
significant effusion, or pneumothorax. Trachea midline. Normal heart
size and vascularity. No acute osseous finding.

Small subcentimeter lower lobe pulmonary nodules are not
demonstrated by plain radiography. These were present on the prior
CT 06/01/2015.
IMPRESSION: Background COPD/ emphysema.

Minimal improvement in the right upper lobe patchy airspace
process/pneumonia.

## 2016-06-14 ENCOUNTER — Inpatient Hospital Stay: Admission: RE | Admit: 2016-06-14 | Payer: Self-pay | Source: Ambulatory Visit

## 2016-06-24 ENCOUNTER — Telehealth: Payer: Self-pay | Admitting: Internal Medicine

## 2016-06-24 NOTE — Telephone Encounter (Signed)
Called and spoke with pt and he is aware of appt scheduled with MW for the yearly follow up.

## 2016-07-24 ENCOUNTER — Emergency Department (HOSPITAL_COMMUNITY)
Admission: EM | Admit: 2016-07-24 | Discharge: 2016-07-24 | Disposition: A | Discharge status: Home / Self Care | Payer: BC Managed Care – PPO | Attending: Physician Assistant | Admitting: Physician Assistant

## 2016-07-24 ENCOUNTER — Encounter (HOSPITAL_COMMUNITY): Payer: Self-pay

## 2016-07-24 ENCOUNTER — Emergency Department (HOSPITAL_COMMUNITY): Payer: BC Managed Care – PPO

## 2016-07-24 DIAGNOSIS — F17210 Nicotine dependence, cigarettes, uncomplicated: Secondary | ICD-10-CM | POA: Insufficient documentation

## 2016-07-24 DIAGNOSIS — J181 Lobar pneumonia, unspecified organism: Secondary | ICD-10-CM | POA: Insufficient documentation

## 2016-07-24 DIAGNOSIS — J449 Chronic obstructive pulmonary disease, unspecified: Secondary | ICD-10-CM | POA: Insufficient documentation

## 2016-07-24 DIAGNOSIS — I10 Essential (primary) hypertension: Secondary | ICD-10-CM | POA: Insufficient documentation

## 2016-07-24 DIAGNOSIS — Z7982 Long term (current) use of aspirin: Secondary | ICD-10-CM | POA: Insufficient documentation

## 2016-07-24 DIAGNOSIS — R05 Cough: Secondary | ICD-10-CM | POA: Diagnosis present

## 2016-07-24 DIAGNOSIS — J189 Pneumonia, unspecified organism: Secondary | ICD-10-CM

## 2016-07-24 MED ORDER — AZITHROMYCIN 250 MG PO TABS
500.0000 mg | ORAL_TABLET | Freq: Once | ORAL | Status: AC
  Administered 2016-07-24: 500 mg via ORAL
  Filled 2016-07-24: qty 2

## 2016-07-24 MED ORDER — BENZONATATE 100 MG PO CAPS: 100.0000 mg | ORAL_CAPSULE | Freq: Three times a day (TID) | ORAL | 0 refills | Status: DC | PRN

## 2016-07-24 MED ORDER — AZITHROMYCIN 250 MG PO TABS: ORAL_TABLET | ORAL | 0 refills | Status: DC

## 2016-07-24 MED ORDER — GUAIFENESIN 100 MG/5ML PO LIQD: 100.0000 mg | ORAL | 0 refills | Status: DC | PRN

## 2016-07-24 NOTE — ED Provider Notes (Signed)
[facility] DEPT Provider Note   CSN: [id number] Arrival date & time: 07/24/16  1141   By signing my name below, I, Roger [name], attest that this documentation has been prepared under the direction and in the presence of Shandon Matson Julio Alm, MD. Electronically Signed: Gwenlyn [name], ED Scribe. 07/24/16. 12:35 PM.   History   Chief Complaint Chief Complaint  Patient presents with  . Facial Pain  . Cough   The history is provided by the patient. No language interpreter was used.   HPI Comments: Roger [name] is a 61 y.o. male with PMHx of COPD, Asthma and HTN who presents to the Emergency Department complaining of a single episode of gradual onset, productive cough with streaks of blood in his sputum beginning this morning. He has had associated sinus pressure, mild weakness and chest congestion for approximately 2 weeks. He has tried ear drops, decongestants and nasal sprays with mild relief. He states he has not taken his decongestant consistently. He does consume alcohol or use illegal drugs. Pt has hx of smoking. Pt had an abnormal CT scan in 2016 and is concerned about possible lung cancer. He as a scheduled follow-up CT scan on 07/27/16.  Past Medical History:  Diagnosis Date  . Anxiety   . Asthma   . COPD (chronic obstructive pulmonary disease) ([place])   . Depression   . Hemorrhoids   . History of rectal bleeding   . Hypertension   . Pneumonia    in 2015  . Post-operative nausea and vomiting     Patient Active Problem List   Diagnosis Date Noted  . COPD GOLD 0 still smoking  07/13/2015  . Hemoptysis 06/11/2015  . CAP (community acquired pneumonia) 06/11/2015  . Lung nodules 06/11/2015  . Cigarette smoker 03/19/2014  . Generalized anxiety disorder 11/09/2011    Past Surgical History:  Procedure Laterality Date  . DENTAL SURGERY    . NOSE SURGERY    . TONSILLECTOMY        Home Medications    Prior to Admission medications   Medication Sig Start Date End  Date Taking? Authorizing Provider  albuterol (PROVENTIL HFA;VENTOLIN HFA) 108 (90 BASE) MCG/ACT inhaler Inhale 1-2 puffs into the lungs every 6 (six) hours as needed for wheezing or shortness of breath. 04/13/15   Okey Regal, PA-C  ALPRAZolam Duanne Moron) 1 MG tablet Take 1 tab 3 times and day and 1/2 at bed time Patient taking differently: Take 1 mg by mouth 5 (five) times daily.  03/20/15   Kathlee Nations, MD  aspirin EC 325 MG tablet Take 325 mg by mouth daily.    Historical Provider, MD  atenolol (TENORMIN) 50 MG tablet Take 50 mg by mouth every morning.     Historical Provider, MD  ibuprofen (ADVIL,MOTRIN) 200 MG tablet Take 400 mg by mouth every 6 (six) hours as needed for headache, mild pain or moderate pain.     Historical Provider, MD  methocarbamol (ROBAXIN) 500 MG tablet Take 1 tablet (500 mg total) by mouth 2 (two) times daily. 02/09/16   Shary Decamp, PA-C  mirtazapine (REMERON) 15 MG tablet Take 15 mg by mouth at bedtime.    Historical Provider, MD    Family History Family History  Problem Relation Age of Onset  . Anxiety disorder Mother   . Depression Mother   . Lung disease Mother     "arthritic lung"  . Anxiety disorder Sister   . Depression Sister   . Anxiety disorder Brother   .  Lung disease Father   . Lung cancer Father   . Anxiety disorder Sister   . Anxiety disorder Brother   . Anxiety disorder Brother   . Anxiety disorder Brother   . Colon cancer Brother     Social History Social History  Substance Use Topics  . Smoking status: Current Every Day Smoker    Packs/day: 0.50    Years: 25.00    Types: Cigarettes  . Smokeless tobacco: Never Used  . Alcohol use No     Allergies   Demerol; Erythromycin; Prednisone; and Sulfa antibiotics   Review of Systems Review of Systems  HENT: Positive for congestion, ear pain and sinus pressure.   Respiratory: Positive for cough.   All other systems reviewed and are negative.  Physical Exam Updated Vital Signs BP  141/72 (BP Location: Left Arm)   Pulse 60   Temp 98 F (36.7 C) (Oral)   Resp 19   Ht 5' 11.75" (1.822 m)   Wt 172 lb (78 kg)   SpO2 97%   BMI 23.49 kg/m   Physical Exam  Cardiovascular: Normal rate, regular rhythm and normal heart sounds.   Pulmonary/Chest: Effort normal and breath sounds normal. No respiratory distress. He has no wheezes. He has no rales.  Breathing comfortable   ED Treatments / Results  DIAGNOSTIC STUDIES: Oxygen Saturation is 97% on RA, normal by my interpretation.    COORDINATION OF CARE: 12:29 PM Discussed treatment plan with pt at bedside which includes Chest XR and Zithromax and pt agreed to plan.  Labs (all labs ordered are listed, but only abnormal results are displayed) Labs Reviewed - No data to display  EKG  EKG Interpretation None       Radiology Dg Chest 2 View  Result Date: 07/24/2016 CLINICAL DATA:  Cough and hemoptysis EXAM: CHEST  2 VIEW COMPARISON:  07/13/2015 FINDINGS: Heart size is normal. There is no pleural effusion or edema. Asymmetric opacity within the right upper lobe is identified and suspicious for pneumonia. Chronic interstitial changes of COPD/ emphysema noted. Left lung appears clear. IMPRESSION: 1. Right upper lobe pneumonia. 2. Emphysema. Followup PA and lateral chest X-ray is recommended in 3-4 weeks following trial of antibiotic therapy to ensure resolution and exclude underlying malignancy. Electronically Signed   By: Kerby Moors M.D.   On: 07/24/2016 12:41    Procedures Procedures (including critical care time)  Medications Ordered in ED Medications  azithromycin (ZITHROMAX) tablet 500 mg (not administered)     Initial Impression / Assessment and Plan / ED Course  I have reviewed the triage vital signs and the nursing notes.  Pertinent labs & imaging results that were available during my care of the patient were reviewed by me and considered in my medical decision making (see chart for details).      Patient is a pleasant 61 year old male presenting with 2 weeks of sinus congestion. Patient had coughing that had tinged with blood today. Patient's concerned because he has has been following nodules that were found the last year on the CT scan. Patient has a follow-up CT scan Wednesday at 1 PM. X-ray today shows right upper lobe pneumonia. This is where the pneumonia has been on all the recent chest x-rays and on CT done in November of last year. I am concerned that this could be underlying malignancy. However right now given the appearance we'll treat for right upper lobe infection and then have him continue to follow up as an outpatient and get his  outpatient CT done on Wednesday.  Given his symptoms, his normal physical exam and normal vital signs I'm not concerned about any kind of large bleed and his pulmonary tree. I believe this represents irritation caused by pneumonia.  Final Clinical Impressions(s) / ED Diagnoses   Final diagnoses:  None    New Prescriptions New Prescriptions   No medications on file   I personally performed the services described in this documentation, which was scribed in my presence. The recorded information has been reviewed and is accurate.      Aloysuis Ribaudo Julio Alm, MD 07/24/16 1250

## 2016-07-24 NOTE — ED Triage Notes (Addendum)
Pt. Has had a sinus infection and chest congestion for 2 weeks and this morning  He developed a productive cough with blood streaks in the sputum.  Pt. Denies any chest pain or sob.    Skin is warm , pink and dry.  Pt.  Is alert and oriented X 4.  Pt. Also reports that he had chills and a fever, but those symptoms have resolved

## 2016-07-24 NOTE — Discharge Instructions (Signed)
You were seen today with cough that had some blood in your sputum. We believe that this is related to the pneumonia that we found on x-ray. Pneumonias in the same place as it was back in November of last year. We are concerned about an underlying malignancy, but no you have a scan scheduled for outpatient on Wednesday. Please continue to follow up with the scan and a primary care physician. Please return if you have any large volume of blood from your cough.

## 2016-07-25 ENCOUNTER — Inpatient Hospital Stay: Admission: RE | Admit: 2016-07-25 | Payer: Self-pay | Source: Ambulatory Visit

## 2016-07-25 ENCOUNTER — Telehealth: Payer: Self-pay | Admitting: Internal Medicine

## 2016-07-25 NOTE — Telephone Encounter (Addendum)
Please advise Dr Melvyn Novas if order is okay as placed - CT without contrast as it is being questioned by Erline Levine at [facility]. Thanks.

## 2016-07-25 NOTE — Telephone Encounter (Signed)
CT s contrast f/u mpns

## 2016-07-25 NOTE — Telephone Encounter (Signed)
Patient called and moved appt up a day for fear of underlying malignancy on chest x-ray (hospital told him this).  He thought he was having a CT on 01/24 but I explained to him this was an OV and his CT was sch for 12/12 and he cancelled this and never called back to resch.  Spoke with Marzetta Board at [facility], she states order is still good and has a 2:30 opening (patient is unsure if he can get transportation).  Needs to know if Dr. Melvyn Novas wants this done without contrast since patient just had chest x-ray done.  Marzetta Board is also calling [facility] about the order/insurance.  Please call patient to see if we can get CT scheduled.  CB is [phone]

## 2016-07-25 NOTE — Telephone Encounter (Signed)
Spoke with Stacy at [facility], aware of order.  States she will call patient and try to work him in prior to tomorrow's office visit.  Nothing further needed at this time.

## 2016-07-25 NOTE — Telephone Encounter (Addendum)
Spoke with [place] with CT. Because this upcoming CT is a repeat, Erline Levine is questioning if this CT needs to be with or without CM. There is an order in already for WO contrast.   Dr. Melvyn Novas please advise. Thanks.   ------------------------OV instructions:  Change aspirin to a baby aspirin daily     You don't have any significant copd now and you never will if you stop smoking at this point   We will contact you for your follow up CT chest in November 2017 - call us in meantime if your condition worsens

## 2016-07-25 NOTE — Telephone Encounter (Signed)
Ct s contrast

## 2016-07-26 ENCOUNTER — Encounter: Payer: Self-pay | Admitting: Internal Medicine

## 2016-07-26 ENCOUNTER — Ambulatory Visit (INDEPENDENT_AMBULATORY_CARE_PROVIDER_SITE_OTHER)
Admission: RE | Admit: 2016-07-26 | Discharge: 2016-07-26 | Disposition: A | Discharge status: Home / Self Care | Payer: BC Managed Care – PPO | Source: Ambulatory Visit | Attending: Adult Health | Admitting: Adult Health

## 2016-07-26 ENCOUNTER — Ambulatory Visit (INDEPENDENT_AMBULATORY_CARE_PROVIDER_SITE_OTHER): Payer: BC Managed Care – PPO | Admitting: Internal Medicine

## 2016-07-26 VITALS — BP 120/90 | HR 62 | Ht 71.75 in | Wt 177.4 lb

## 2016-07-26 DIAGNOSIS — F17210 Nicotine dependence, cigarettes, uncomplicated: Secondary | ICD-10-CM | POA: Diagnosis not present

## 2016-07-26 DIAGNOSIS — R918 Other nonspecific abnormal finding of lung field: Secondary | ICD-10-CM | POA: Diagnosis not present

## 2016-07-26 DIAGNOSIS — J449 Chronic obstructive pulmonary disease, unspecified: Secondary | ICD-10-CM | POA: Diagnosis not present

## 2016-07-26 DIAGNOSIS — R042 Hemoptysis: Secondary | ICD-10-CM | POA: Diagnosis not present

## 2016-07-26 MED ORDER — IOPAMIDOL (ISOVUE-300) INJECTION 61%
80.0000 mL | Freq: Once | INTRAVENOUS | Status: AC | PRN
  Administered 2016-07-26: 80 mL via INTRAVENOUS

## 2016-07-26 NOTE — Progress Notes (Signed)
Subjective:    Patient ID: Roger [name], male    DOB: [birth year]     MRN: [id number]    Brief patient profile:  87  yowm active smoker referred to pulmonary clinic 03/17/2014 for new onset hemoptysis by Dr Dorthy Cooler with documented GOLD 0 copd 11/2013    History of Present Illness  03/17/2014 1st [facility] office visit/ Roger [name]   Chief Complaint  Patient presents with  . Pulmonary Consult    Referred by Dr. Lujean Amel. Pt states that he was dxed with PNA about 1 wk ago.  He states that he started to have hemoptysis and went to ED and had CT Chest.  He is currently being txed with Levaquin, but still having minimal hemoptysis daily.   acute sneeze/ cough walking into locker room > immediately to ER dx with CAP and rx with levaquin x 7d.   No epistaxis, min chills, no fever or cp, some sore throat but resolved. Max bleeding was sev tbsp now down to streaky only and mostly in ams rec Stop all aspirin until no blood for 3 days then ok to restart baby aspirin The key is to stop smoking completely before smoking completely stops you!     03/28/2014 f/u ov/Roger [name] re: f/u pna with hemoptysis Chief Complaint  Patient presents with  . Followup with CXR    Cough is improving, but not resolved. Still has occ hemoptysis.   min blood, very dark specks even back on asa 81 mg daily (never stopped as rec) . No cp or sob  >>no changes   06/11/2015 NP Acute OV  Presents for an acute office visit . Complains of persistent hemoptysis.   Pt c/o prod cough with small amount of bloody mucus, chest congestion, sinus drainage and pressure. SOB and wheezing at times. Denies any chest tightness, fever, nausea or vomiting.  Has been to ER x 4 in last 2 months for hemoptysis  Chest x-ray and CT chest shows a right upper lobe airspace disease, advanced emphysema, and pulmonary nodules up to 4 mm.. Tx with levaquin. Then changed to augmentin and doxycyline . Last dose today.  Had similar episode 1 year ago, sx  resolved with abx. follow up chest xray 03/28/14 that showed clearing of RUL. Infiltrate.  Denies swallow issues .  Remains on aspirin '81mg'$  daily  Coughs up blood tinged mucus most days.  Still smoking. Discussed smoking cessation  rec Hold Aspirin for 5 days then can restart  Finish Augmentin and Doxycycline .  Mucinex DM Twice daily  As needed  Cough/congestion .  Work to quit smoking.  Follow up CT chest in 1 year.  Follow up chest xray in 2-3 weeks and As needed  With Dr. Melvyn Novas       07/13/2015  f/u ov/Roger [name] re: hemoptysis/ still smoking with  GOLD 0 COPD  Chief Complaint  Patient presents with  . Follow-up    CXR done. Pt states cough has improved- has not had any more hemoptysis. He does produce some occ clear sputum.   says doesn't remember being told re asa (was done in writing) and actually takes 325 mg and not the baby asa and never changed it even when actively bleeding.  Not limited by breathing from desired activities. rec Change aspirin to a baby aspirin daily   You don't have any significant copd now and you never will if you stop smoking at this point We will contact you for your follow up CT chest  in November 2017 - call us in meantime if your condition worsens      07/26/2016  f/u ov/Roger [name] re:  Recurrent hemoptysis  Chief Complaint  Patient presents with  . Acute Visit    Pt states started having hemoptysis on 07/24/16- went to ED and was dxed with CAP. He states prior to this visit he had been having sinus issues.   still on full asa daily > acute onset 07/24/16 assoc with bloody nasal drainage > not even a tbsp > rx zpak and cough syrup and improved Not limited by breathing from desired activities  But not very active/ not using saba    No obvious day to day or daytime variability or assoc  cp or chest tightness, subjective wheeze or overt sinus or hb symptoms. No unusual exp hx or h/o childhood pna/ asthma or knowledge of premature birth.  Sleeping ok without  nocturnal  or early am exacerbation  of respiratory  c/o's or need for noct saba. Also denies any obvious fluctuation of symptoms with weather or environmental changes or other aggravating or alleviating factors except as outlined above   Current Medications, Allergies, Complete Past Medical History, Past Surgical History, Family History, and Social History were reviewed in [employer] record.  ROS  The following are not active complaints unless bolded sore throat, dysphagia, dental problems, itching, sneezing,  nasal congestion or excess/ purulent secretions, ear ache,   fever, chills, sweats, unintended wt loss, classically pleuritic or exertional cp, hemoptysis,  orthopnea pnd or leg swelling, presyncope, palpitations, abdominal pain, anorexia, nausea, vomiting, diarrhea  or change in bowel or bladder habits, change in stools or urine, dysuria,hematuria,  rash, arthralgias, visual complaints, headache, numbness, weakness or ataxia or problems with walking or coordination,  change in mood/affect or memory.           Objective:   Physical Exam  amb wm nad top dentures/ partials bottom  03/28/2014    192  >>189 06/11/2015 >  07/13/2015  196 > 07/26/2016    177   Vital signs reviewed   - Note on arrival 02 sats  100% on RA        HEENT: nl dentition, turbinates, and orophanx. Nl external ear canals without cough reflex   NECK :  without JVD/Nodes/TM/ nl carotid upstrokes bilaterally   LUNGS: no acc muscle use, clear to A and P bilaterally without cough on insp or exp maneuvers   CV:  RRR  no s3 or murmur or increase in P2, no edema   ABD:  soft and nontender with nl excursion in the supine position. No bruits or organomegaly, bowel sounds nl  MS:  warm without deformities, calf tenderness, cyanosis or clubbing  SKIN: warm and dry without lesions    NEURO:  alert, approp, no deficits    I personally reviewed images and agree with radiology impression as  follows:  CTwith contrast Chest  07/26/16  1. RIGHT upper lobe airspace disease superimposed on centrilobular emphysema. Findings favor pneumonia. Cannot exclude an element of pulmonary hemorrhage. 2. Mild RIGHT hilar adenopathy is likely reactive. 3. Recommend follow-up CT after appropriate antibiotic therapy to exclude underlying nodularity. No mass lesion identified. My impression: area medial RUL vs 05/2015 same area of as dz/ more prominent with no ct's in between                         Assessment & Plan:

## 2016-07-26 NOTE — Patient Instructions (Addendum)
Come to outpatient registration at [facility] (behind the ER) at 7:15 am Wednesday  08/10/16  with nothing to eat or drink after midnight Tuesday .for Bronchoscopy   In meantime reduce the aspirin to 81 mg daily and stop it after Sunday 08/07/16 - don't take any aspirin at all if actively bleeding

## 2016-07-27 ENCOUNTER — Ambulatory Visit: Payer: Self-pay | Admitting: Internal Medicine

## 2016-07-30 ENCOUNTER — Encounter (HOSPITAL_COMMUNITY): Payer: Self-pay

## 2016-07-30 ENCOUNTER — Emergency Department (HOSPITAL_COMMUNITY): Payer: BC Managed Care – PPO

## 2016-07-30 ENCOUNTER — Emergency Department (HOSPITAL_COMMUNITY)
Admission: EM | Admit: 2016-07-30 | Discharge: 2016-07-30 | Disposition: A | Discharge status: Home / Self Care | Payer: BC Managed Care – PPO | Attending: Emergency Medicine | Admitting: Emergency Medicine

## 2016-07-30 DIAGNOSIS — I10 Essential (primary) hypertension: Secondary | ICD-10-CM | POA: Insufficient documentation

## 2016-07-30 DIAGNOSIS — F17210 Nicotine dependence, cigarettes, uncomplicated: Secondary | ICD-10-CM | POA: Diagnosis not present

## 2016-07-30 DIAGNOSIS — R042 Hemoptysis: Secondary | ICD-10-CM | POA: Insufficient documentation

## 2016-07-30 DIAGNOSIS — Z79899 Other long term (current) drug therapy: Secondary | ICD-10-CM | POA: Insufficient documentation

## 2016-07-30 DIAGNOSIS — J449 Chronic obstructive pulmonary disease, unspecified: Secondary | ICD-10-CM | POA: Insufficient documentation

## 2016-07-30 DIAGNOSIS — Z7982 Long term (current) use of aspirin: Secondary | ICD-10-CM | POA: Diagnosis not present

## 2016-07-30 LAB — I-STAT CHEM 8, ED
BUN: 9 mg/dL (ref 6–20)
CHLORIDE: 103 mmol/L (ref 101–111)
CREATININE: 0.7 mg/dL (ref 0.61–1.24)
Calcium, Ion: 1.2 mmol/L (ref 1.15–1.40)
GLUCOSE: 126 mg/dL — AB (ref 65–99)
HCT: 47 % (ref 39.0–52.0)
Hemoglobin: 16 g/dL (ref 13.0–17.0)
Potassium: 4 mmol/L (ref 3.5–5.1)
Sodium: 141 mmol/L (ref 135–145)
TCO2: 27 mmol/L (ref 0–100)

## 2016-07-30 NOTE — ED Provider Notes (Signed)
[facility] DEPT Provider Note   CSN: [id number] Arrival date & time: 07/30/16  4098     History   Chief Complaint Chief Complaint  Patient presents with  . hemoptysis, PNA    HPI Roger [name] is a 61 y.o. male who presents with hemoptysis. PMH significant for recent diagnosis of PNA, COPD, current tobacco use, long term use of ASA, and anxiety. He states that he was seen in the ED on 1/21 and diagnosed with RUL PNA. He was put on a Z-pack and told to follow up with pulmonology. He went to see Dr. Melvyn Novas on 1/23 and had CT of chest which confirmed RUL PNA on superimposed emphysema. Could not exclude an element of pulmonary hemorrhage. He reports he continues to cough up blood tinged mucus and is concerned. Denies gross blood. He is on a full strength ASA but switched to '81mg'$  two days ago. He is scheduled for a video bronchoscopy with fluoro on Feb 7. Cough has been improving with Robotussin and Mucinex. He denies fever, chills, SOB, chest pain, N/V, hematemesis, rectal bleeding.  HPI  Past Medical History:  Diagnosis Date  . Anxiety   . Asthma   . COPD (chronic obstructive pulmonary disease) ([place])   . Depression   . Hemorrhoids   . History of rectal bleeding   . Hypertension   . Pneumonia    in 2015  . Post-operative nausea and vomiting     Patient Active Problem List   Diagnosis Date Noted  . COPD GOLD 0 still smoking  07/13/2015  . Hemoptysis 06/11/2015  . CAP (community acquired pneumonia) 06/11/2015  . Lung nodules 06/11/2015  . Cigarette smoker 03/19/2014  . Generalized anxiety disorder 11/09/2011    Past Surgical History:  Procedure Laterality Date  . DENTAL SURGERY    . NOSE SURGERY    . TONSILLECTOMY       Home Medications    Prior to Admission medications   Medication Sig Start Date End Date Taking? Authorizing Provider  albuterol (PROVENTIL HFA;VENTOLIN HFA) 108 (90 BASE) MCG/ACT inhaler Inhale 1-2 puffs into the lungs every 6 (six) hours as needed  for wheezing or shortness of breath. 04/13/15   Okey Regal, PA-C  ALPRAZolam Duanne Moron) 1 MG tablet Take 1 tab 3 times and day and 1/2 at bed time Patient taking differently: Take 1 mg by mouth 5 (five) times daily.  03/20/15   Kathlee Nations, MD  aspirin EC 325 MG tablet Take 325 mg by mouth daily.    Historical Provider, MD  atenolol (TENORMIN) 50 MG tablet Take 50 mg by mouth every morning.     Historical Provider, MD  azithromycin (ZITHROMAX Z-PAK) 250 MG tablet Take one a day for 4 days 07/24/16   Courteney Lyn Mackuen, MD  benzonatate (TESSALON PERLES) 100 MG capsule Take 1 capsule (100 mg total) by mouth 3 (three) times daily as needed for cough. 07/24/16   Courteney Lyn Mackuen, MD  guaiFENesin (ROBITUSSIN) 100 MG/5ML liquid Take 5-10 mLs (100-200 mg total) by mouth every 4 (four) hours as needed for cough. 07/24/16   Courteney Lyn Mackuen, MD  ibuprofen (ADVIL,MOTRIN) 200 MG tablet Take 400 mg by mouth every 6 (six) hours as needed for headache, mild pain or moderate pain.     Historical Provider, MD  methocarbamol (ROBAXIN) 500 MG tablet Take 1 tablet (500 mg total) by mouth 2 (two) times daily. 02/09/16   Shary Decamp, PA-C  mirtazapine (REMERON) 15 MG tablet Take 15 mg  by mouth at bedtime.    Historical Provider, MD    Family History Family History  Problem Relation Age of Onset  . Anxiety disorder Mother   . Depression Mother   . Lung disease Mother     "arthritic lung"  . Anxiety disorder Sister   . Depression Sister   . Anxiety disorder Brother   . Lung disease Father   . Lung cancer Father   . Anxiety disorder Sister   . Anxiety disorder Brother   . Anxiety disorder Brother   . Anxiety disorder Brother   . Colon cancer Brother     Social History Social History  Substance Use Topics  . Smoking status: Current Every Day Smoker    Packs/day: 0.50    Years: 25.00    Types: Cigarettes  . Smokeless tobacco: Never Used  . Alcohol use No     Allergies   Demerol;  Erythromycin; Prednisone; and Sulfa antibiotics   Review of Systems Review of Systems  Constitutional: Negative for chills and fever.  HENT: Positive for congestion, sinus pain and sinus pressure. Negative for ear pain and trouble swallowing.   Respiratory: Positive for cough. Negative for shortness of breath.        +hemoptysis  Cardiovascular: Negative for chest pain.  Psychiatric/Behavioral: The patient is nervous/anxious.   All other systems reviewed and are negative.    Physical Exam Updated Vital Signs BP 144/72 (BP Location: Right Arm)   Pulse (!) 58   Temp 98.3 F (36.8 C) (Oral)   Resp 16   Ht '5\' 11"'$  (1.803 m)   Wt 80.3 kg   SpO2 100%   BMI 24.69 kg/m   Physical Exam  Constitutional: He is oriented to person, place, and time. He appears well-developed and well-nourished. No distress.  Well-appearing, NAD, pleasant but anxious  HENT:  Head: Normocephalic and atraumatic.  Right Ear: Hearing, tympanic membrane, external ear and ear canal normal.  Left Ear: Hearing, tympanic membrane, external ear and ear canal normal.  Nose: Right sinus exhibits maxillary sinus tenderness and frontal sinus tenderness. Left sinus exhibits maxillary sinus tenderness and frontal sinus tenderness.  Mouth/Throat: Uvula is midline, oropharynx is clear and moist and mucous membranes are normal.  Eyes: Conjunctivae are normal. Pupils are equal, round, and reactive to light. Right eye exhibits no discharge. Left eye exhibits no discharge. No scleral icterus.  Neck: Normal range of motion.  Cardiovascular: Regular rhythm.  Bradycardia present.  Exam reveals no gallop and no friction rub.   No murmur heard. Pulmonary/Chest: Effort normal and breath sounds normal. No respiratory distress. He has no wheezes. He has no rales. He exhibits no tenderness.  Patient coughed up dime size clot of blood mixed with yellowish phlegm   Abdominal: He exhibits no distension.  Neurological: He is alert and  oriented to person, place, and time.  Skin: Skin is warm and dry.  Psychiatric: His behavior is normal. His mood appears anxious.  Nursing note and vitals reviewed.    ED Treatments / Results  Labs (all labs ordered are listed, but only abnormal results are displayed) Labs Reviewed  I-STAT CHEM 8, ED - Abnormal; Notable for the following:       Result Value   Glucose, Bld 126 (*)    All other components within normal limits    EKG  EKG Interpretation None       Radiology Dg Chest 2 View  Result Date: 07/30/2016 CLINICAL DATA:  Hemoptysis for 1 week recent  community-acquired pneumonia EXAM: CHEST  2 VIEW COMPARISON:  07/24/2016 FINDINGS: Right upper lobe pneumonia again noted, unchanged. Left lung is clear. Heart is normal size. No effusions or acute bony abnormality. IMPRESSION: Right upper lobe pneumonia. Electronically Signed   By: Rolm Baptise M.D.   On: 07/30/2016 12:47    Procedures Procedures (including critical care time)  Medications Ordered in ED Medications - No data to display   Initial Impression / Assessment and Plan / ED Course  I have reviewed the triage vital signs and the nursing notes.  Pertinent labs & imaging results that were available during my care of the patient were reviewed by me and considered in my medical decision making (see chart for details).  61 year old male presents with hemoptysis which is mild. He is bradycardic (on beta blocker) and normotensive. He is not hypoxic. No difficulty breathing. Hemoglobin is at baseline. CXR appears unchanged. Pt is very anxious about continued coughing up blood. He repeatedly asks if he needs more antibiotics and is very concerned about ongoing bleeding in his chest. Reassurance given. He has a follow up scheduled for Feb 7th. Advised follow up with Dr. Melvyn Novas and return for worsening symptoms. Patient is NAD, non-toxic, with stable VS. Patient is informed of clinical course, understands medical decision  making process, and agrees with plan. Opportunity for questions provided and all questions answered. Return precautions given.   Final Clinical Impressions(s) / ED Diagnoses   Final diagnoses:  Hemoptysis    New Prescriptions New Prescriptions   No medications on file     Recardo Evangelist, PA-C 07/30/16 [place], MD 07/31/16 [phone]

## 2016-07-30 NOTE — Discharge Instructions (Signed)
Continue taking robitussin/mucinex Only take '81mg'$  Aspirin daily Cut back on smoking Follow up with Dr. Melvyn Novas Return for worsening symptoms

## 2016-07-30 NOTE — ED Triage Notes (Signed)
Patient diagnosed with pneumonia last Sunday and took z-pack. Had CT chest this week and only pneumonia showing. Here due to hemoptysis, NAD. NO coughing on arival

## 2016-07-31 ENCOUNTER — Encounter: Payer: Self-pay | Admitting: Internal Medicine

## 2016-07-31 NOTE — Assessment & Plan Note (Signed)
Recurrent, suspicious for bronchogenic ca in this active smoker > fob indicated

## 2016-07-31 NOTE — Assessment & Plan Note (Signed)
PFT's  11/09/2013  FEV1 3.38 (93 % ) ratio 71  p no % improvement from saba with DLCO  64 % corrects to 69 % for alv volume   Despite ongoing smoking he still does not have significant copd and could tolerate RULobectomy if needed   For now not even using much saba > ok to continue just albuterol prn

## 2016-07-31 NOTE — Assessment & Plan Note (Signed)
>   3 min Discussed the risks and costs (both direct and indirect)  of smoking relative to the benefits of quitting but patient unwilling to commit at this point to a specific quit date.    Although I don't endorse regular use of e cigs/ many pts find them helpful; however, I emphasized they should be considered a "one-way bridge" off all tobacco products.  

## 2016-07-31 NOTE — Assessment & Plan Note (Addendum)
See CT chest 06/01/15 x 4 mm  - CTwith contrast Chest  07/26/16  1. RIGHT upper lobe airspace disease superimposed on centrilobular emphysema. Findings favor pneumonia. Cannot exclude an element of pulmonary hemorrhage. 2. Mild RIGHT hilar adenopathy is likely reactive. 3. Recommend follow-up CT after appropriate antibiotic therapy to exclude underlying nodularity. No mass lesion identified. My impression: area medial RUL vs 05/2015 same area of as dz/ more prominent with no ct's in between      I had an extended discussion with the patient reviewing all relevant studies completed to date and  lasting 15 to 20 minutes of a 25 minute visit on the following ongoing concerns:   I am concerned he may have a BAC or adeno explaining the "recurrent" infiltrates in the same area of the RUL so rec FOB   Discussed in detail all the  indications, usual  risks and alternatives  relative to the benefits with patient who agrees to proceed with bronchoscopy with biopsy.

## 2016-08-08 ENCOUNTER — Telehealth: Payer: Self-pay | Admitting: Internal Medicine

## 2016-08-08 NOTE — Telephone Encounter (Signed)
Ok so how about 2/28 - if so call bronch lab to let them know

## 2016-08-08 NOTE — Telephone Encounter (Signed)
Spoke with pt. States that he needs to reschedule his bronch. This is scheduled for 08/10/2016. States that due to his work schedule, he will have to put this off for at least 3 weeks.  MW - please advise. Thanks.

## 2016-08-08 NOTE — Telephone Encounter (Signed)
Spoke with pt. States that this date would work fine for me. Called Respiratory and spoke with Baxter Flattery. Bronch has been moved to 2/28. Will route message back to MW to make him aware.

## 2016-08-10 ENCOUNTER — Encounter (HOSPITAL_COMMUNITY): Payer: Self-pay

## 2016-08-14 ENCOUNTER — Encounter (HOSPITAL_COMMUNITY): Payer: Self-pay | Admitting: Emergency Medicine

## 2016-08-14 ENCOUNTER — Emergency Department (HOSPITAL_COMMUNITY): Payer: BC Managed Care – PPO

## 2016-08-14 ENCOUNTER — Emergency Department (HOSPITAL_COMMUNITY)
Admission: EM | Admit: 2016-08-14 | Discharge: 2016-08-14 | Disposition: A | Discharge status: Home / Self Care | Payer: BC Managed Care – PPO | Attending: Emergency Medicine | Admitting: Emergency Medicine

## 2016-08-14 DIAGNOSIS — R791 Abnormal coagulation profile: Secondary | ICD-10-CM | POA: Diagnosis not present

## 2016-08-14 DIAGNOSIS — J449 Chronic obstructive pulmonary disease, unspecified: Secondary | ICD-10-CM | POA: Diagnosis not present

## 2016-08-14 DIAGNOSIS — R0981 Nasal congestion: Secondary | ICD-10-CM | POA: Diagnosis not present

## 2016-08-14 DIAGNOSIS — I10 Essential (primary) hypertension: Secondary | ICD-10-CM | POA: Insufficient documentation

## 2016-08-14 DIAGNOSIS — F17210 Nicotine dependence, cigarettes, uncomplicated: Secondary | ICD-10-CM | POA: Diagnosis not present

## 2016-08-14 DIAGNOSIS — Z7982 Long term (current) use of aspirin: Secondary | ICD-10-CM | POA: Insufficient documentation

## 2016-08-14 DIAGNOSIS — R042 Hemoptysis: Secondary | ICD-10-CM

## 2016-08-14 DIAGNOSIS — Z79899 Other long term (current) drug therapy: Secondary | ICD-10-CM | POA: Insufficient documentation

## 2016-08-14 LAB — CBC WITH DIFFERENTIAL/PLATELET
BASOS PCT: 1 %
Basophils Absolute: 0.1 10*3/uL (ref 0.0–0.1)
Eosinophils Absolute: 0.1 10*3/uL (ref 0.0–0.7)
Eosinophils Relative: 2 %
HEMATOCRIT: 44.4 % (ref 39.0–52.0)
HEMOGLOBIN: 15.2 g/dL (ref 13.0–17.0)
LYMPHS ABS: 2.1 10*3/uL (ref 0.7–4.0)
LYMPHS PCT: 25 %
MCH: 31.9 pg (ref 26.0–34.0)
MCHC: 34.2 g/dL (ref 30.0–36.0)
MCV: 93.1 fL (ref 78.0–100.0)
MONO ABS: 0.7 10*3/uL (ref 0.1–1.0)
MONOS PCT: 9 %
NEUTROS ABS: 5.1 10*3/uL (ref 1.7–7.7)
NEUTROS PCT: 63 %
Platelets: 196 10*3/uL (ref 150–400)
RBC: 4.77 MIL/uL (ref 4.22–5.81)
RDW: 12.5 % (ref 11.5–15.5)
WBC: 8.1 10*3/uL (ref 4.0–10.5)

## 2016-08-14 LAB — COMPREHENSIVE METABOLIC PANEL
ALBUMIN: 3.7 g/dL (ref 3.5–5.0)
ALK PHOS: 94 U/L (ref 38–126)
ALT: 18 U/L (ref 17–63)
ANION GAP: 5 (ref 5–15)
AST: 24 U/L (ref 15–41)
BILIRUBIN TOTAL: 1.1 mg/dL (ref 0.3–1.2)
BUN: 7 mg/dL (ref 6–20)
CO2: 29 mmol/L (ref 22–32)
CREATININE: 0.85 mg/dL (ref 0.61–1.24)
Calcium: 9.4 mg/dL (ref 8.9–10.3)
Chloride: 105 mmol/L (ref 101–111)
GFR calc non Af Amer: >60 mL/min (ref >60)
Glucose, Bld: 94 mg/dL (ref 65–99)
POTASSIUM: 4.4 mmol/L (ref 3.5–5.1)
Sodium: 139 mmol/L (ref 135–145)
Total Protein: 7.1 g/dL (ref 6.5–8.1)

## 2016-08-14 LAB — PROTIME-INR
INR: 0.97
PROTHROMBIN TIME: 12.9 s (ref 11.4–15.2)

## 2016-08-14 LAB — APTT: aPTT: 34 seconds (ref 24–36)

## 2016-08-14 MED ORDER — LORATADINE 10 MG PO TABS: 10.0000 mg | ORAL_TABLET | Freq: Every day | ORAL | 0 refills | Status: DC

## 2016-08-14 MED ORDER — MOMETASONE FUROATE 50 MCG/ACT NA SUSP: 2.0000 | Freq: Every day | NASAL | 12 refills | Status: DC

## 2016-08-14 NOTE — ED Notes (Signed)
Pt back from x-ray.

## 2016-08-14 NOTE — ED Notes (Signed)
Patient transported to X-ray 

## 2016-08-14 NOTE — ED Triage Notes (Signed)
Pt reports here for recheck of pneumonia. Pt reports that he is still coughing up blood. Pt just completed dose of levoquin Thursday.

## 2016-08-14 NOTE — ED Provider Notes (Signed)
[facility] DEPT Provider Note   CSN: [id number] Arrival date & time: 08/14/16  0930     History   Chief Complaint Chief Complaint  Patient presents with  . Pneumonia    HPI Roger [name] is a 61 y.o. male.  HPI Patient recently completed a course of Levaquin for treatment for you. Denies current fever or chills. States his cough has improved. He occasionally "spits up" blood. Was evaluated by pulmonology and has bronchoscopy scheduled for the end of month. Patient states he's had frontal sinus pressure and nasal congestion. He also has pressure in the ears. Past Medical History:  Diagnosis Date  . Anxiety   . Asthma   . COPD (chronic obstructive pulmonary disease) ([place])   . Depression   . Hemorrhoids   . History of rectal bleeding   . Hypertension   . Pneumonia    in 2015  . Post-operative nausea and vomiting     Patient Active Problem List   Diagnosis Date Noted  . COPD GOLD 0 still smoking  07/13/2015  . Hemoptysis 06/11/2015  . CAP (community acquired pneumonia) 06/11/2015  . Lung nodules 06/11/2015  . Cigarette smoker 03/19/2014  . Generalized anxiety disorder 11/09/2011    Past Surgical History:  Procedure Laterality Date  . DENTAL SURGERY    . NOSE SURGERY    . TONSILLECTOMY         Home Medications    Prior to Admission medications   Medication Sig Start Date End Date Taking? Authorizing Provider  ALPRAZolam Duanne Moron) 1 MG tablet Take 1 tab 3 times and day and 1/2 at bed time Patient taking differently: Take 1 mg by mouth 4 (four) times daily as needed for anxiety.  03/20/15  Yes Kathlee Nations, MD  aspirin EC 325 MG tablet Take 325 mg by mouth daily.   Yes Historical Provider, MD  atenolol (TENORMIN) 50 MG tablet Take 50 mg by mouth every morning.    Yes Historical Provider, MD  guaifenesin (ROBITUSSIN) 100 MG/5ML syrup Take 200 mg by mouth 3 (three) times daily as needed for cough.   Yes Historical Provider, MD  mirtazapine (REMERON) 15 MG tablet  Take 15 mg by mouth at bedtime.   Yes Historical Provider, MD  albuterol (PROVENTIL HFA;VENTOLIN HFA) 108 (90 BASE) MCG/ACT inhaler Inhale 1-2 puffs into the lungs every 6 (six) hours as needed for wheezing or shortness of breath. 04/13/15   Okey Regal, PA-C  ibuprofen (ADVIL,MOTRIN) 200 MG tablet Take 400 mg by mouth every 6 (six) hours as needed for headache, mild pain or moderate pain.     Historical Provider, MD  loratadine (CLARITIN) 10 MG tablet Take 1 tablet (10 mg total) by mouth daily. 08/14/16   Julianne Rice, MD  mometasone (NASONEX) 50 MCG/ACT nasal spray Place 2 sprays into the nose daily. 08/14/16   Julianne Rice, MD    Family History Family History  Problem Relation Age of Onset  . Anxiety disorder Mother   . Depression Mother   . Lung disease Mother     "arthritic lung"  . Anxiety disorder Sister   . Depression Sister   . Anxiety disorder Brother   . Lung disease Father   . Lung cancer Father   . Anxiety disorder Sister   . Anxiety disorder Brother   . Anxiety disorder Brother   . Anxiety disorder Brother   . Colon cancer Brother     Social History Social History  Substance Use Topics  . Smoking  status: Current Every Day Smoker    Packs/day: 0.50    Years: 25.00    Types: Cigarettes  . Smokeless tobacco: Never Used  . Alcohol use No     Allergies   Demerol; Erythromycin; Prednisone; and Sulfa antibiotics   Review of Systems Review of Systems  Constitutional: Negative for chills, diaphoresis and fever.  HENT: Positive for congestion, ear pain and sinus pressure. Negative for sore throat.   Respiratory: Positive for cough. Negative for chest tightness, shortness of breath and wheezing.   Cardiovascular: Negative for chest pain.  Gastrointestinal: Negative for abdominal pain, constipation, diarrhea, nausea and vomiting.  Genitourinary: Negative for flank pain, frequency and hematuria.  Musculoskeletal: Negative for back pain, myalgias, neck pain  and neck stiffness.  Skin: Negative for rash and wound.  Neurological: Positive for headaches. Negative for dizziness, weakness, light-headedness and numbness.  All other systems reviewed and are negative.    Physical Exam Updated Vital Signs BP 131/60   Pulse (!) 41   Temp 97.9 F (36.6 C) (Oral)   Resp 17   Ht 5' 11.75" (1.822 m)   Wt 175 lb (79.4 kg)   SpO2 99%   BMI 23.90 kg/m   Physical Exam  Constitutional: He is oriented to person, place, and time. He appears well-developed and well-nourished. No distress.  HENT:  Head: Normocephalic and atraumatic.  Mouth/Throat: Oropharynx is clear and moist.  Bilateral nasal mucosal edema. Patient has small amount of dark, dried blood in the left nare. Erythematous and friable mucosa. No active epistaxis visualized. Oropharynx is Clear.  Eyes: EOM are normal. Pupils are equal, round, and reactive to light.  Neck: Normal range of motion. Neck supple. No JVD present.  Cardiovascular: Normal rate and regular rhythm.  Exam reveals no gallop and no friction rub.   No murmur heard. Pulmonary/Chest: Effort normal and breath sounds normal. No respiratory distress. He has no wheezes. He has no rales. He exhibits no tenderness.  Abdominal: Soft. Bowel sounds are normal. There is no tenderness. There is no rebound and no guarding.  Musculoskeletal: Normal range of motion. He exhibits no edema or tenderness.  No lower extremity swelling, asymmetry or tenderness.  Lymphadenopathy:    He has no cervical adenopathy.  Neurological: He is alert and oriented to person, place, and time.  Moves all extremities without deficit. Sensation fully intact.  Skin: Skin is warm and dry. Capillary refill takes less than 2 seconds. No rash noted. No erythema.  Psychiatric: He has a normal mood and affect. His behavior is normal.  Nursing note and vitals reviewed.    ED Treatments / Results  Labs (all labs ordered are listed, but only abnormal results are  displayed) Labs Reviewed  PROTIME-INR  CBC WITH DIFFERENTIAL/PLATELET  COMPREHENSIVE METABOLIC PANEL  APTT    EKG  EKG Interpretation None       Radiology Dg Chest 2 View  Result Date: 08/14/2016 CLINICAL DATA:  Productive cough, hemoptysis EXAM: CHEST  2 VIEW COMPARISON:  07/30/2016, 06/01/2015 FINDINGS: There is mild bilateral chronic interstitial thickening. There is no focal consolidation. There is no pleural effusion or pneumothorax. The heart and mediastinal contours are unremarkable. The osseous structures are unremarkable. IMPRESSION: No active cardiopulmonary disease. Electronically Signed   By: Kathreen Devoid   On: 08/14/2016 12:31    Procedures Procedures (including critical care time)  Medications Ordered in ED Medications - No data to display   Initial Impression / Assessment and Plan / ED Course  I have  reviewed the triage vital signs and the nursing notes.  Pertinent labs & imaging results that were available during my care of the patient were reviewed by me and considered in my medical decision making (see chart for details).     Resolved infiltrate on x-ray. Normal white blood cell count. Stable hemoglobin. Small amount of blood in the left nare. Patient does have a history of cigarette smoking. Agree with pulmonary follow-up for bronchoscopy.  Right upper lobe infiltrate has resolved on x-ray. Patient has a normal white blood cell count and stable hemoglobin. Advised to stop smoking. Return precautions given. Final Clinical Impressions(s) / ED Diagnoses   Final diagnoses:  Nasal sinus congestion  Blood in sputum    New Prescriptions New Prescriptions   LORATADINE (CLARITIN) 10 MG TABLET    Take 1 tablet (10 mg total) by mouth daily.   MOMETASONE (NASONEX) 50 MCG/ACT NASAL SPRAY    Place 2 sprays into the nose daily.     Julianne Rice, MD 08/14/16 1416

## 2016-08-29 ENCOUNTER — Telehealth: Payer: Self-pay | Admitting: Internal Medicine

## 2016-08-29 DIAGNOSIS — R918 Other nonspecific abnormal finding of lung field: Secondary | ICD-10-CM

## 2016-08-29 NOTE — Telephone Encounter (Signed)
Spoke with pt, aware of recs.  rov scheduled and cxr ordered for this ov.  Lm with respiratory to cancel bronch on 2/28.  Nothing further needed.

## 2016-08-29 NOTE — Telephone Encounter (Signed)
Let's just cancel it and have pt return in 6 weeks with cxr and regroup then re need for bronch

## 2016-08-29 NOTE — Telephone Encounter (Signed)
Spoke with pt. He is wanting to reschedule his bronch that's scheduled on 08/31/16. States that he has just recently gone back to work. Pt would like to reschedule this procedure to the end of March, first of April.  MW - please advise. Thanks.

## 2016-08-31 ENCOUNTER — Encounter (HOSPITAL_COMMUNITY): Payer: Self-pay

## 2016-08-31 ENCOUNTER — Ambulatory Visit (HOSPITAL_COMMUNITY): Admit: 2016-08-31 | Payer: BC Managed Care – PPO | Admitting: Internal Medicine

## 2016-08-31 SURGERY — BRONCHOSCOPY, WITH FLUOROSCOPY
Anesthesia: Moderate Sedation | Laterality: Bilateral

## 2016-09-29 ENCOUNTER — Other Ambulatory Visit: Payer: Self-pay | Admitting: Physician Assistant

## 2016-09-29 DIAGNOSIS — R0989 Other specified symptoms and signs involving the circulatory and respiratory systems: Secondary | ICD-10-CM

## 2016-10-05 ENCOUNTER — Ambulatory Visit
Admission: RE | Admit: 2016-10-05 | Discharge: 2016-10-05 | Disposition: A | Discharge status: Home / Self Care | Payer: BC Managed Care – PPO | Source: Ambulatory Visit | Attending: Physician Assistant | Admitting: Physician Assistant

## 2016-10-05 DIAGNOSIS — R0989 Other specified symptoms and signs involving the circulatory and respiratory systems: Secondary | ICD-10-CM

## 2016-10-10 ENCOUNTER — Ambulatory Visit: Payer: Self-pay | Admitting: Internal Medicine

## 2016-10-12 ENCOUNTER — Other Ambulatory Visit: Payer: Self-pay | Admitting: Vascular Surgery

## 2016-10-26 ENCOUNTER — Encounter: Payer: Self-pay | Admitting: Vascular Surgery

## 2016-11-01 ENCOUNTER — Ambulatory Visit: Payer: Self-pay | Admitting: Internal Medicine

## 2016-11-02 ENCOUNTER — Encounter: Payer: Self-pay | Admitting: Vascular Surgery

## 2016-11-02 ENCOUNTER — Ambulatory Visit (INDEPENDENT_AMBULATORY_CARE_PROVIDER_SITE_OTHER): Payer: BC Managed Care – PPO | Admitting: Vascular Surgery

## 2016-11-02 VITALS — BP 141/73 | HR 51 | Temp 96.9°F | Resp 16 | Ht 71.75 in | Wt 176.0 lb

## 2016-11-02 DIAGNOSIS — I6521 Occlusion and stenosis of right carotid artery: Secondary | ICD-10-CM

## 2016-11-02 NOTE — Progress Notes (Signed)
Patient name: Roger [name] MRN: [id number] DOB: Jun 22, 1956 Sex: male   REASON FOR CONSULT:    Right carotid bruit. Right external carotid artery stenosis. Referred by [facility].  HPI:   Roger [name] is a 61 y.o. male, who was referred for a carotid evaluation. I have reviewed the records that were sent from the referring office. The patient was recently seen with a URI. During that visit, the patient was found to have a right carotid bruit.  This prompted a carotid duplex scan and he was found to have a greater than 70% right external carotid artery stenosis. The patient is left-handed. He denies any previous history of stroke, TIAs, expressive or receptive aphasia, or amaurosis fugax. He is on aspirin and is on a statin.  Past Medical History:  Diagnosis Date  . Anxiety   . Asthma   . COPD (chronic obstructive pulmonary disease) ([place])   . Depression   . Hemorrhoids   . History of rectal bleeding   . Hypertension   . Pneumonia    in 2015  . Post-operative nausea and vomiting     Family History  Problem Relation Age of Onset  . Anxiety disorder Mother   . Depression Mother   . Lung disease Mother     "arthritic lung"  . Anxiety disorder Sister   . Depression Sister   . Anxiety disorder Brother   . Lung disease Father   . Lung cancer Father   . Anxiety disorder Sister   . Anxiety disorder Brother   . Anxiety disorder Brother   . Anxiety disorder Brother   . Colon cancer Brother     SOCIAL HISTORY: Social History   Social History  . Marital status: Single    Spouse name: N/A  . Number of children: N/A  . Years of education: N/A   Occupational History  . Not on file.   Social History Main Topics  . Smoking status: Current Every Day Smoker    Packs/day: 0.50    Years: 25.00    Types: Cigarettes  . Smokeless tobacco: Never Used  . Alcohol use No  . Drug use: No  . Sexual activity: Not Currently   Other Topics Concern  . Not on file    Social History Narrative  . No narrative on file    Allergies  Allergen Reactions  . Demerol Palpitations and Other (See Comments)    Heart beats fast  . Erythromycin Nausea And Vomiting  . Prednisone Other (See Comments)    "felt weird"  . Sulfa Antibiotics Nausea And Vomiting and Other (See Comments)    hematoemesis    Current Outpatient Prescriptions  Medication Sig Dispense Refill  . ALPRAZolam (XANAX) 1 MG tablet Take 1 tab 3 times and day and 1/2 at bed time (Patient taking differently: Take 1 mg by mouth 4 (four) times daily as needed for anxiety. ) 105 tablet 2  . aspirin EC 325 MG tablet Take 325 mg by mouth daily.    Marland Kitchen atenolol (TENORMIN) 50 MG tablet Take 50 mg by mouth every morning.     Marland Kitchen atorvastatin (LIPITOR) 10 MG tablet Take 10 mg by mouth daily.    . mirtazapine (REMERON) 15 MG tablet Take 15 mg by mouth at bedtime.    . mometasone (NASONEX) 50 MCG/ACT nasal spray Place 2 sprays into the nose daily. 17 g 12  . guaifenesin (ROBITUSSIN) 100 MG/5ML syrup Take 200 mg by mouth 3 (three)  times daily as needed for cough.    Marland Kitchen ibuprofen (ADVIL,MOTRIN) 200 MG tablet Take 400 mg by mouth every 6 (six) hours as needed for headache, mild pain or moderate pain.     Marland Kitchen loratadine (CLARITIN) 10 MG tablet Take 1 tablet (10 mg total) by mouth daily. (Patient not taking: Reported on 11/02/2016) 30 tablet 0   No current facility-administered medications for this visit.     REVIEW OF SYSTEMS:  '[X]'$  denotes positive finding, '[ ]'$  denotes negative finding Cardiac  Comments:  Chest pain or chest pressure:    Shortness of breath upon exertion:    Short of breath when lying flat:    Irregular heart rhythm:        Vascular    Pain in calf, thigh, or hip brought on by ambulation: X   Pain in feet at night that wakes you up from your sleep:     Blood clot in your veins:    Leg swelling:         Pulmonary    Oxygen at home:    Productive cough:     Wheezing:         Neurologic     Sudden weakness in arms or legs:     Sudden numbness in arms or legs:     Sudden onset of difficulty speaking or slurred speech:    Temporary loss of vision in one eye:     Problems with dizziness:         Gastrointestinal    Blood in stool:     Vomited blood:         Genitourinary    Burning when urinating:     Blood in urine:        Psychiatric    Major depression:  X       Hematologic    Bleeding problems:    Problems with blood clotting too easily:        Skin    Rashes or ulcers:        Constitutional    Fever or chills:     PHYSICAL EXAM:   Vitals:   11/02/16 1115 11/02/16 1118  BP: 134/78 (!) 141/73  Pulse: (!) 51   Resp: 16   Temp: (!) 96.9 F (36.1 C)   TempSrc: Oral   SpO2: 97%   Weight: 176 lb (79.8 kg)   Height: 5' 11.75" (1.822 m)     GENERAL: The patient is a well-nourished male, in no acute distress. The vital signs are documented above. CARDIAC: There is a regular rate and rhythm.  VASCULAR: He has a right carotid bruit. He has palpable femoral, popliteal, and posterior tibial pulses bilaterally. He has no significant lower extremity swelling. PULMONARY: There is good air exchange bilaterally without wheezing or rales. ABDOMEN: Soft and non-tender with normal pitched bowel sounds.  MUSCULOSKELETAL: There are no major deformities or cyanosis. NEUROLOGIC: No focal weakness or paresthesias are detected. SKIN: There are no ulcers or rashes noted. PSYCHIATRIC: The patient has a normal affect.  DATA:    CAROTID DUPLEX: I have reviewed the carotid duplex scan that was done by [facility] on 10/05/2016. There is a less than 50% right internal carotid artery stenosis. There is a greater than 70% stenosis at the origin of the external carotid artery. There is a less than 50% left internal carotid artery stenosis. Both vertebral arteries are patent with antegrade flow.  MEDICAL ISSUES:   GREATER THAN 70% RIGHT EXTERNAL  CAROTID ARTERY STENOSIS:  The patient has no evidence of significant internal carotid artery disease bilaterally. I reassured him that the stenosis in the external carotid artery is not clinically significant and no further follow up is indicated based on his current duplex scan. He is on aspirin and is on a statin. He is trying to quit smoking and we have discussed the importance of this. I will be happy to see him back at any time if any new vascular issues arise.  Deitra Mayo [facility] [phone]

## 2016-11-29 ENCOUNTER — Ambulatory Visit: Payer: Self-pay | Admitting: Internal Medicine

## 2017-01-23 ENCOUNTER — Encounter: Payer: Self-pay | Admitting: Pulmonary Disease

## 2017-01-23 ENCOUNTER — Telehealth: Payer: Self-pay | Admitting: Pulmonary Disease

## 2017-01-23 ENCOUNTER — Ambulatory Visit (INDEPENDENT_AMBULATORY_CARE_PROVIDER_SITE_OTHER): Payer: BC Managed Care – PPO | Admitting: Pulmonary Disease

## 2017-01-23 VITALS — BP 128/82 | HR 72 | Ht 71.75 in | Wt 171.0 lb

## 2017-01-23 DIAGNOSIS — J449 Chronic obstructive pulmonary disease, unspecified: Secondary | ICD-10-CM | POA: Diagnosis not present

## 2017-01-23 DIAGNOSIS — R042 Hemoptysis: Secondary | ICD-10-CM

## 2017-01-23 NOTE — Progress Notes (Signed)
   Subjective:    Patient ID: Roger [name], male    DOB: 29-Sep-1955, 61 y.o.   MRN: [id number]  HPI  13  yowm active smoker referred to pulmonary clinic 03/17/2014 for new onset hemoptysis by Dr Dorthy Cooler with documented GOLD 0 copd 11/2013   Chief Complaint  Patient presents with  . Acute Visit    Cough up blood since last Tuesday. Dark red blood in phlegm. Denies any SOB or chest pain.    Presents with another episode of hemoptysis, blood-tinged dark red sputum Was given an antibiotic by PCP, denies preceding fevers/URI symptoms. Takes 81 mg of aspirin daily. Denies chest pain or shortness of breath  CT chest from 07/2016 had shown right upper lobe airspace disease and mild right hilar lymphadenopathy FU CXR 08/2016 showed resolution He was treated with antibiotics and aspirin was decreased from 325-81 mg.  He had 3-4 such episodes of hemoptysis over the last 3 years starting in 2015  Chest x-ray 7/17 was reviewed which shows right upper lobe haziness ? Blood versus new pneumonia   Review of Systems    neg for any significant sore throat, dysphagia, itching, sneezing, nasal congestion or excess/ purulent secretions, fever, chills, sweats, unintended wt loss, pleuritic or exertional cp,  orthopnea pnd or change in chronic leg swelling.  Also denies presyncope, palpitations, heartburn, abdominal pain, nausea, vomiting, diarrhea or change in bowel or urinary habits, dysuria,hematuria, rash, arthralgias, visual complaints, headache, numbness weakness or ataxia.    Objective:   Physical Exam   Gen. Pleasant, well-nourished, in no distress ENT - no thrush, no post nasal drip Neck: No JVD, no thyromegaly, no carotid bruits Lungs: no use of accessory muscles, no dullness to percussion, clear without rales or rhonchi  Cardiovascular: Rhythm regular, heart sounds  normal, no murmurs or gallops, no peripheral edema Musculoskeletal: No deformities, no cyanosis or clubbing           Assessment & Plan:

## 2017-01-23 NOTE — Patient Instructions (Signed)
Complete course of amoxicillin CT chest with IV contrast -schedule in the next 2-3 days Based on this we will decide on the need for bronchoscopy  Okay to take Delsym cough syrup 5 ML twice a day for 3 days  Call us if you have more blood in your sputum  Okay to go back to work

## 2017-01-23 NOTE — Assessment & Plan Note (Signed)
Smoking cessation was again emphasized

## 2017-01-23 NOTE — Assessment & Plan Note (Signed)
Complete course of amoxicillin CT chest with IV contrast -schedule in the next 2-3 days Based on this we will decide on the need for bronchoscopy  Okay to take Delsym cough syrup 5 ML twice a day for 3 days  Call us if you have more blood in your sputum  Okay to go back to work

## 2017-01-23 NOTE — Telephone Encounter (Signed)
Spoke with patient. He wanted to know what RA meant by the haze on a previous xray. Advised him that was the reason RA wanted him to have the CT scan on Friday. He verbalized understanding. Nothing further needed at time of call.

## 2017-01-24 ENCOUNTER — Telehealth: Payer: Self-pay | Admitting: Pulmonary Disease

## 2017-01-24 NOTE — Telephone Encounter (Signed)
Pt is requesting his work note corrected releasing him to work full duty-no restrictions.  Pt also wants to know if he needs a follow up appt soon. Pt aware that he has a follow up appt in August.  Pt states that he coughed up more blood this morning and his job wanted him evaluated again - pt wants to pick up this letter if he is able to be seen today.   Please advise Dr [name]. Thanks.

## 2017-01-24 NOTE — Telephone Encounter (Signed)
Okay to give him note. Please expedite his CT scan if able Follow-up can be after CT scan with Dr. Melvyn Novas

## 2017-01-24 NOTE — Telephone Encounter (Signed)
Spoke with patient. I will type the letter for him to return to work on full duty, with info on his upcoming CT scan, as well as the follow up info. His F/U appt with MW has been moved to next Monday 7/30 at 12pm to ease his worries.   Pt verbalized understanding. Nothing further needed at time of call.   Letter has been placed up front.

## 2017-01-27 ENCOUNTER — Ambulatory Visit (INDEPENDENT_AMBULATORY_CARE_PROVIDER_SITE_OTHER)
Admission: RE | Admit: 2017-01-27 | Discharge: 2017-01-27 | Disposition: A | Discharge status: Home / Self Care | Payer: BC Managed Care – PPO | Source: Ambulatory Visit | Attending: Pulmonary Disease | Admitting: Pulmonary Disease

## 2017-01-27 DIAGNOSIS — R042 Hemoptysis: Secondary | ICD-10-CM

## 2017-01-27 MED ORDER — IOPAMIDOL (ISOVUE-300) INJECTION 61%
80.0000 mL | Freq: Once | INTRAVENOUS | Status: AC | PRN
Start: 2017-01-27 — End: 2017-01-27
  Administered 2017-01-27: 80 mL via INTRAVENOUS

## 2017-01-30 ENCOUNTER — Telehealth: Payer: Self-pay | Admitting: Pulmonary Disease

## 2017-01-30 ENCOUNTER — Ambulatory Visit (INDEPENDENT_AMBULATORY_CARE_PROVIDER_SITE_OTHER): Payer: BC Managed Care – PPO | Admitting: Internal Medicine

## 2017-01-30 ENCOUNTER — Other Ambulatory Visit (INDEPENDENT_AMBULATORY_CARE_PROVIDER_SITE_OTHER): Payer: BC Managed Care – PPO

## 2017-01-30 ENCOUNTER — Encounter: Payer: Self-pay | Admitting: Internal Medicine

## 2017-01-30 VITALS — BP 112/70 | HR 69 | Ht 71.75 in | Wt 174.0 lb

## 2017-01-30 DIAGNOSIS — R918 Other nonspecific abnormal finding of lung field: Secondary | ICD-10-CM | POA: Diagnosis not present

## 2017-01-30 DIAGNOSIS — F17210 Nicotine dependence, cigarettes, uncomplicated: Secondary | ICD-10-CM

## 2017-01-30 LAB — CBC WITH DIFFERENTIAL/PLATELET
Basophils Absolute: 0.1 10*3/uL (ref 0.0–0.1)
Basophils Relative: 1.1 % (ref 0.0–3.0)
EOS ABS: 0.2 10*3/uL (ref 0.0–0.7)
EOS PCT: 2.2 % (ref 0.0–5.0)
HCT: 45.7 % (ref 39.0–52.0)
HEMOGLOBIN: 15.6 g/dL (ref 13.0–17.0)
LYMPHS ABS: 1.9 10*3/uL (ref 0.7–4.0)
Lymphocytes Relative: 21.9 % (ref 12.0–46.0)
MCHC: 34.2 g/dL (ref 30.0–36.0)
MCV: 94.2 fl (ref 78.0–100.0)
MONO ABS: 0.7 10*3/uL (ref 0.1–1.0)
Monocytes Relative: 8.5 % (ref 3.0–12.0)
NEUTROS PCT: 66.3 % (ref 43.0–77.0)
Neutro Abs: 5.8 10*3/uL (ref 1.4–7.7)
Platelets: 235 10*3/uL (ref 150.0–400.0)
RBC: 4.84 Mil/uL (ref 4.22–5.81)
RDW: 12.6 % (ref 11.5–15.5)
WBC: 8.8 10*3/uL (ref 4.0–10.5)

## 2017-01-30 LAB — SEDIMENTATION RATE: SED RATE: 21 mm/h — AB (ref 0–20)

## 2017-01-30 MED ORDER — LEVOFLOXACIN 500 MG PO TABS: 500.0000 mg | ORAL_TABLET | Freq: Every day | ORAL | 5 refills | Status: DC

## 2017-01-30 NOTE — Progress Notes (Signed)
Spoke with pt and notified of results per Dr. Wert. Pt verbalized understanding and denied any questions. 

## 2017-01-30 NOTE — Progress Notes (Signed)
Pt has appt with MW 01/30/17 to discuss results

## 2017-01-30 NOTE — Progress Notes (Signed)
Subjective:    Patient ID: Roger [name], male    DOB: [birth year]     MRN: [id number]    Brief patient profile:  44  yowm active smoker referred to pulmonary clinic 03/17/2014 for new onset hemoptysis by Dr Roger [name] with documented GOLD 0 copd 11/2013    History of Present Illness  03/17/2014 1st [facility] office visit/ Roger [name]   Chief Complaint  Patient presents with  . Pulmonary Consult    Referred by Dr. Lujean [name]. Pt states that he was dxed with PNA about 1 wk ago.  He states that he started to have hemoptysis and went to ED and had CT Chest.  He is currently being txed with Levaquin, but still having minimal hemoptysis daily.   acute sneeze/ cough walking into locker room > immediately to ER dx with CAP and rx with levaquin x 7d.   No epistaxis, min chills, no fever or cp, some sore throat but resolved. Max bleeding was sev tbsp now down to streaky only and mostly in ams rec Stop all aspirin until no blood for 3 days then ok to restart baby aspirin The key is to stop smoking completely before smoking completely stops you!       06/11/2015 NP Acute OV  Presents for an acute office visit . Complains of persistent hemoptysis.   Pt c/o prod cough with small amount of bloody mucus, chest congestion, sinus drainage and pressure. SOB and wheezing at times. Denies any chest tightness, fever, nausea or vomiting.  Has been to ER x 4 in last 2 months for hemoptysis  Chest x-ray and CT chest shows a right upper lobe airspace disease, advanced emphysema, and pulmonary nodules up to 4 mm.. Tx with levaquin. Then changed to augmentin and doxycyline . Last dose today.  Had similar episode 1 year ago, sx resolved with abx. follow up chest xray 03/28/14 that showed clearing of RUL. Infiltrate.  Denies swallow issues .  Remains on aspirin 81mg  daily  Coughs up blood tinged mucus most days.  Still smoking. Discussed smoking cessation  rec Hold Aspirin for 5 days then can restart  Finish  Augmentin and Doxycycline .  Mucinex DM Twice daily  As needed  Cough/congestion .  Work to quit smoking.  Follow up CT chest in 1 year.  Follow up chest xray in 2-3 weeks and As needed  With Dr. Melvyn [name]       07/13/2015  f/u ov/Roger [name] re: hemoptysis/ still smoking with  GOLD 0 COPD  Chief Complaint  Patient presents with  . Follow-up    CXR done. Pt states cough has improved- has not had any more hemoptysis. He does produce some occ clear sputum.   says doesn't remember being told re asa (was done in writing) and actually takes 325 mg and not the baby asa and never changed it even when actively bleeding.  Not limited by breathing from desired activities. rec Change aspirin to a baby aspirin daily   You don't have any significant copd now and you never will if you stop smoking at this point We will contact you for your follow up CT chest in November 2017 - call us in meantime if your condition worsens      07/26/2016  f/u ov/Roger [name] re:  Recurrent hemoptysis  Chief Complaint  Patient presents with  . Acute Visit    Pt states started having hemoptysis on 07/24/16- went to ED and was dxed with CAP. He states prior to  this visit he had been having sinus issues.   still on full asa daily > acute onset 07/24/16 assoc with bloody nasal drainage > not even a tbsp > rx zpak and cough syrup and improved Not limited by breathing from desired activities  But not very active/ not using saba  rec Come to outpatient registration at [name] (behind the ER) at 7:15 am Wednesday  08/10/16  with nothing to eat or drink after midnight Tuesday .for Bronchoscopy  In meantime reduce the aspirin to 81 mg daily and stop it after Sunday 08/07/16 - don't take any aspirin at all if actively bleeding    08/08/16 Spoke with pt. States that he needs to reschedule his bronch. This is scheduled for 08/10/2016. States that due to his work schedule, he will have to put this off for at least 3 weeks.   pt called  08/29/2016 to cancel and rec f/u 6 weeks with cxr  > did not return to pulmonary clinic   01/23/17  Acute  Ov/ Roger [name] for hemoptysis on 325 mg daily asa rec Complete course of amoxicillin CT chest with IV contrast -schedule in the next 2-3 days Based on this we will decide on the need for bronchoscopy Okay to take Delsym cough syrup 5 ML twice a day for 3 days Call us if you have more blood in your sputum Okay to go back to work     01/30/2017  f/u ov/Roger [name] re: f/u hemoptysis Chief Complaint  Patient presents with  . Follow-up    Discuss ct chest. His cough has improved but still producing minimal blood- dark red.   was on 325 mg once daily at onset 2 weeks prior to OV   Presently taking 81 mg daily with blood minimal amt in am/ all dark after finish amox Not limited by breathing from desired activities     No obvious day to day or daytime variability or assoc excess/ purulent sputum or mucus plugs  or cp or chest tightness, subjective wheeze or overt sinus or hb symptoms. No unusual exp hx or h/o childhood pna/ asthma or knowledge of premature birth.  Sleeping ok without nocturnal  or early am exacerbation  of respiratory  c/o's or need for noct saba. Also denies any obvious fluctuation of symptoms with weather or environmental changes or other aggravating or alleviating factors except as outlined above   Current Medications, Allergies, Complete Past Medical History, Past Surgical History, Family History, and Social History were reviewed in [employer] record.  ROS  The following are not active complaints unless bolded sore throat, dysphagia, dental problems, itching, sneezing,  nasal congestion or excess/ purulent secretions, ear ache,   fever, chills, sweats, unintended wt loss, classically pleuritic or exertional cp,  orthopnea pnd or leg swelling, presyncope, palpitations, abdominal pain, anorexia, nausea, vomiting, diarrhea  or change in bowel or bladder habits,  change in stools or urine, dysuria,hematuria,  rash, arthralgias, visual complaints, headache, numbness, weakness or ataxia or problems with walking or coordination,  change in mood/affect or memory.                      Objective:   Physical Exam  amb wm nad  03/28/2014    192  >>189 06/11/2015 >  07/13/2015  196 > 07/26/2016    177 > 174 01/31/2017   Vital signs reviewed   - Note on arrival 02 sats  96% on RA  HEENT: nl  turbinates, and orophanx. Nl external ear canals without cough reflex -top dentures/ partials bottom    NECK :  without JVD/Nodes/TM/ nl carotid upstrokes bilaterally   LUNGS: no acc muscle use, clear to A and P bilaterally without cough on insp or exp maneuvers   CV:  RRR  no s3 or murmur or increase in P2, no edema   ABD:  soft and nontender with nl excursion in the supine position. No bruits or organomegaly, bowel sounds nl  MS:  warm without deformities, calf tenderness, cyanosis or clubbing  SKIN: warm and dry without lesions    NEURO:  alert, approp, no deficits    I personally reviewed images and agree with radiology impression as follows:  CT w/ contrast 01/27/17 1. There are persistent airspace densities identified within the right upper lobe compared with 07/26/2016. Etiology indeterminate. Consider further evaluation with bronchoscopy to assess for underlying predisposing malignant lesion. 2. Enlarging subcentimeter nodule within the left lower lobe now measures 8 mm. This is at the size threshold for sensitivity of PET-CT. Recommend follow-up imaging six months. If this continues to increase in size and PET-CT and/or tissue sampling is advised. 3. Stable chronic nodular densities within the posteromedial right lower lobe when compared with study from 2016. Favor benign postinflammatory or infectious process. Attention on follow-up imaging advised.          Lab Results  Component Value Date   ESRSEDRATE 21 (H) 01/30/2017        EOS                        0.2                                                                    01/30/2017  Lab Results  Component Value Date   HGB 15.6 01/30/2017   HGB 15.2 08/14/2016   HGB 16.0 07/30/2016      Labs ordered 01/30/2017   Quantiferon Gold TB                  Assessment & Plan:

## 2017-01-30 NOTE — Telephone Encounter (Signed)
Per 7.27.18 CT Chest result note:Result Notes  Notes recorded by Rosana Berger, CMA on 01/30/2017 at 9:48 AM EDT LMTCB ------  Notes recorded by Rigoberto Noel, MD on 01/30/2017 at 8:00 AM EDT Changes Of pneumonia left upper lung - what looks like pneumonia could also be aspirated blood Nodule is slightly larger 8 mm - but still small - will need FU scan in 6 months Defer bronchoscopy to dr wert    Hulen Skains spoke with patient's sister Ronney Lion (dpr on file) to discuss results However, pt is actually scheduled to see MW today at 12N and results will be discussed with pt at that time Nothing further needed at this point in documentation Will sign off

## 2017-01-30 NOTE — Patient Instructions (Addendum)
Please remember to go to the  Lab  department downstairs in the basement  for your tests - we will call you with the results when they are available.   At onset Levaquin 500 mg daily x 7 days  And if it continues to schedule bronchoscopy   We will contact you for CT scan in 6 months

## 2017-01-30 NOTE — Progress Notes (Signed)
LMTCB

## 2017-01-31 ENCOUNTER — Encounter: Payer: Self-pay | Admitting: Internal Medicine

## 2017-01-31 NOTE — Assessment & Plan Note (Signed)
>   3 m He is reasonably concerned about the risk of lung ca but unwilling to commit to quit at this point/ reviewed

## 2017-01-31 NOTE — Assessment & Plan Note (Signed)
See CT chest 06/01/15  1. Right upper lobe airspace disease. Distribution and morphology favor infection. 2. Advanced centrilobular emphysema with pulmonary nodules up to 4 Mm.  - CTwith contrast Chest  07/26/16  1. RIGHT upper lobe airspace disease superimposed on centrilobular emphysema. Findings favor pneumonia. Cannot exclude an element of pulmonary hemorrhage. 2. Mild RIGHT hilar adenopathy is likely reactive. 3. Recommend follow-up CT after appropriate antibiotic therapy to exclude underlying nodularity. No mass lesion identified. My impression: area medial RUL vs 05/2015 same area of as dz/ more prominent with no ct's in between  rec fob > pt called 08/29/2016 to cancel and rec f/u 6 weeks with cxr   CT  01/27/17 1. There are persistent airspace densities identified within the right upper lobe compared with 07/26/2016. Etiology indeterminate. Consider further evaluation with bronchoscopy to assess for underlying predisposing malignant lesion. 2. Enlarging subcentimeter nodule within the left lower lobe now measures 8 mm. This is at the size threshold for sensitivity of PET-CT. Recommend follow-up imaging six months. If this continues to increase in size and PET-CT and/or tissue sampling is advised. 3. Stable chronic nodular densities within the posteromedial right lower lobe when compared with study from 2016. Favor benign postinflammatory or infectious process. Attention on follow-up imaging advised   This is clearly not BAC which is the main ddx for persistent as dz RUL with recurrent hemoptysis but we don't have a def dx and FOB with tbbx would be reasonable at this point as it was previously when he called to cancel.  Discussed in detail all the  indications, usual  risks and alternatives  relative to the benefits with patient who agrees to proceed with conservative f/u as outlined  With ct in 6 months or fob in meantime if hemoptysis recurs on 81 mg asa daily   I had an  extended discussion with the patient reviewing all relevant studies completed to date and  lasting 15 to 20 minutes of a 25 minute visit    Each maintenance medication was reviewed in detail including most importantly the difference between maintenance and prns and under what circumstances the prns are to be triggered using an action plan format that is not reflected in the computer generated alphabetically organized AVS.    Please see AVS for specific instructions unique to this visit that I personally wrote and verbalized to the the pt in detail and then reviewed with pt  by my nurse highlighting any  changes in therapy recommended at today's visit to their plan of care.

## 2017-02-22 ENCOUNTER — Ambulatory Visit: Payer: Self-pay | Admitting: Internal Medicine

## 2017-04-25 ENCOUNTER — Telehealth: Payer: Self-pay | Admitting: Internal Medicine

## 2017-04-25 DIAGNOSIS — R918 Other nonspecific abnormal finding of lung field: Secondary | ICD-10-CM

## 2017-04-25 NOTE — Telephone Encounter (Signed)
Spoke with pt, who is concerned about lung cancer, as he just loss a second brother to lung cancer.  Pt is aware that per last OV note from 01/30/17, he is to have repeat CT in 35mo.  Pt is aware that closer to 26mo,our PCC's will contact him to schedule scan.  RA please advise if you would like CT w or w/o?Thanks.    Patient Instructions    Please remember to go to the  Lab  department downstairs in the basement  for your tests - we will call you with the results when they are available.   At onset Levaquin 500 mg daily x 7 days  And if it continues to schedule bronchoscopy   We will contact you for CT scan in 6 months

## 2017-04-26 NOTE — Telephone Encounter (Signed)
MW primary for this pt

## 2017-04-26 NOTE — Telephone Encounter (Signed)
CT s contrast

## 2017-04-26 NOTE — Telephone Encounter (Signed)
Spoke with patient. He is aware of the CT scan. Will go ahead and place order today. Advised him that the [facility] will contact him in order to get this scheduled.

## 2017-07-27 ENCOUNTER — Inpatient Hospital Stay: Admission: RE | Admit: 2017-07-27 | Payer: Self-pay | Source: Ambulatory Visit

## 2017-08-03 ENCOUNTER — Ambulatory Visit: Payer: Self-pay | Admitting: Internal Medicine

## 2017-08-10 ENCOUNTER — Inpatient Hospital Stay: Admission: RE | Admit: 2017-08-10 | Payer: Self-pay | Source: Ambulatory Visit

## 2017-08-14 ENCOUNTER — Ambulatory Visit: Payer: Self-pay | Admitting: Internal Medicine

## 2017-08-24 ENCOUNTER — Inpatient Hospital Stay: Admission: RE | Admit: 2017-08-24 | Payer: Self-pay | Source: Ambulatory Visit

## 2017-08-28 ENCOUNTER — Ambulatory Visit: Payer: Self-pay | Admitting: Internal Medicine

## 2017-09-18 ENCOUNTER — Inpatient Hospital Stay: Admission: RE | Admit: 2017-09-18 | Payer: Self-pay | Source: Ambulatory Visit

## 2017-11-03 NOTE — Telephone Encounter (Signed)
Closing chart

## 2018-02-20 ENCOUNTER — Emergency Department (HOSPITAL_COMMUNITY)
Admission: EM | Admit: 2018-02-20 | Discharge: 2018-02-20 | Disposition: A | Discharge status: Home / Self Care | Payer: BC Managed Care – PPO | Attending: Emergency Medicine | Admitting: Emergency Medicine

## 2018-02-20 ENCOUNTER — Other Ambulatory Visit: Payer: Self-pay

## 2018-02-20 ENCOUNTER — Encounter (HOSPITAL_COMMUNITY): Payer: Self-pay | Admitting: Emergency Medicine

## 2018-02-20 DIAGNOSIS — F17210 Nicotine dependence, cigarettes, uncomplicated: Secondary | ICD-10-CM | POA: Diagnosis not present

## 2018-02-20 DIAGNOSIS — Z79899 Other long term (current) drug therapy: Secondary | ICD-10-CM | POA: Diagnosis not present

## 2018-02-20 DIAGNOSIS — L989 Disorder of the skin and subcutaneous tissue, unspecified: Secondary | ICD-10-CM | POA: Diagnosis not present

## 2018-02-20 DIAGNOSIS — J449 Chronic obstructive pulmonary disease, unspecified: Secondary | ICD-10-CM | POA: Insufficient documentation

## 2018-02-20 DIAGNOSIS — I10 Essential (primary) hypertension: Secondary | ICD-10-CM | POA: Insufficient documentation

## 2018-02-20 DIAGNOSIS — Z7982 Long term (current) use of aspirin: Secondary | ICD-10-CM | POA: Insufficient documentation

## 2018-02-20 NOTE — Discharge Instructions (Addendum)
Thank you for allowing me to care for you today in the Emergency Department.   I have included several other dermatology practices.  You can also call the dermatology clinic that you have an appointment with in September and get on their cancellation list to see if you can be seen sooner.  You can take Tylenol or ibuprofen for headache.  Return to the emergency department if you develop fever, chills, or if you develop thick, mucus-like drainage from the area on her forehead.

## 2018-02-20 NOTE — ED Triage Notes (Signed)
Patient to ED for wound recheck - reports abscess to forehead in between eyes x 3 weeks, has seen his PCP and tried antibiotic and topical cream without relief. Increased in size since.

## 2018-02-20 NOTE — ED Provider Notes (Signed)
[facility] EMERGENCY DEPARTMENT Provider Note   CSN: [id number] Arrival date & time: 02/20/18  0845     History   Chief Complaint Chief Complaint  Patient presents with  . Wound Check    HPI Roger [name] is a 62 y.o. male with a history of anxiety, depression, and HTN who presents to the emergency department with a chief complaint of facial lesion.  The patient endorses a lesion noted over the glabella area of his face that began approximately 3 weeks ago.  He reports that the lesion was initially enlarging, but has been the same size over the last week.  He was seen by his PCP for the same on 02/07/2018 and 02/14/2018.  He was treating the area with an antibiotic ointment with no improvement and was given an ambulatory referral to dermatology.  He reports that he is unable to get an appointment with dermatology until mid-September.  He reports an associated mild headache.  He denies a fever, chills, pruritus.  He is concerned that he has a skin cancer as he has a family history of multiple types of cancer, but no family history of skin cancer.  He has been treating his headaches with Tylenol and ibuprofen with improvement.  The history is provided by the patient. No language interpreter was used.    Past Medical History:  Diagnosis Date  . Anxiety   . Asthma   . COPD (chronic obstructive pulmonary disease) ([place])   . Depression   . Hemorrhoids   . History of rectal bleeding   . Hypertension   . Pneumonia    in 2015  . Post-operative nausea and vomiting     Patient Active Problem List   Diagnosis Date Noted  . COPD GOLD 0 still smoking  07/13/2015  . Hemoptysis 06/11/2015  . CAP (community acquired pneumonia) 06/11/2015  . Lung nodules 06/11/2015  . Cigarette smoker 03/19/2014  . Generalized anxiety disorder 11/09/2011    Past Surgical History:  Procedure Laterality Date  . DENTAL SURGERY    . NOSE SURGERY    . TONSILLECTOMY          Home  Medications    Prior to Admission medications   Medication Sig Start Date End Date Taking? Authorizing Provider  ALPRAZolam Duanne Moron) 1 MG tablet Take 1 mg by mouth 5 (five) times daily.    [provider]  amoxicillin (AMOXIL) 875 MG tablet Take 875 mg by mouth 2 (two) times daily.    [provider]  aspirin EC 81 MG tablet Take 81 mg by mouth daily.    [provider]  atenolol (TENORMIN) 50 MG tablet Take 50 mg by mouth every morning.     [provider]  atorvastatin (LIPITOR) 10 MG tablet Take 10 mg by mouth daily.    [provider]  ibuprofen (ADVIL,MOTRIN) 200 MG tablet Take 400 mg by mouth every 6 (six) hours as needed for headache, mild pain or moderate pain.     [provider]  levofloxacin (LEVAQUIN) 500 MG tablet Take 1 tablet (500 mg total) by mouth daily. 01/30/17   Tanda Rockers, MD  mirtazapine (REMERON) 15 MG tablet Take 15 mg by mouth at bedtime.    [provider]    Family History Family History  Problem Relation Age of Onset  . Anxiety disorder Mother   . Depression Mother   . Lung disease Mother        "arthritic lung"  .  Anxiety disorder Sister   . Depression Sister   . Anxiety disorder Brother   . Lung disease Father   . Lung cancer Father   . Anxiety disorder Sister   . Anxiety disorder Brother   . Anxiety disorder Brother   . Anxiety disorder Brother   . Colon cancer Brother     Social History Social History   Tobacco Use  . Smoking status: Current Every Day Smoker    Packs/day: 0.50    Years: 25.00    Pack years: 12.50    Types: Cigarettes  . Smokeless tobacco: Never Used  Substance Use Topics  . Alcohol use: No    Alcohol/week: 0.0 standard drinks  . Drug use: No     Allergies   Demerol; Erythromycin; Prednisone; and Sulfa antibiotics   Review of Systems Review of Systems  Constitutional: Negative for activity change, chills and fever.  Respiratory: Negative for  shortness of breath.   Cardiovascular: Negative for chest pain.  Gastrointestinal: Negative for abdominal pain.  Musculoskeletal: Negative for back pain.  Skin: Positive for rash and wound.     Physical Exam Updated Vital Signs BP 140/63 (BP Location: Right Arm)   Pulse 63   Temp (!) 97.5 F (36.4 C) (Oral)   Resp 17   Ht 5' 11.75" (1.822 m)   Wt 75.9 kg   SpO2 100%   BMI 22.87 kg/m   Physical Exam  Constitutional: He appears well-developed.  HENT:  Head: Normocephalic.    There is a 5 mm circular, dome-shaped nodule with a minimal amount of scaling to the lesion.  No surrounding erythema, edema, or warmth.  No bulla, vesicles, pustules, desquamation, petechiae, or purpura.  No fluctuance or induration.  Eyes: Conjunctivae are normal.  Neck: Neck supple.  Cardiovascular: Normal rate, regular rhythm, normal heart sounds and intact distal pulses. Exam reveals no gallop and no friction rub.  No murmur heard. Pulmonary/Chest: Effort normal.  Abdominal: Soft. He exhibits no distension.  Neurological: He is alert.  Skin: Skin is warm and dry.  Psychiatric: His behavior is normal. His mood appears anxious.  Very anxious appearing  Nursing note and vitals reviewed.       ED Treatments / Results  Labs (all labs ordered are listed, but only abnormal results are displayed) Labs Reviewed - No data to display  EKG None  Radiology No results found.  Procedures Procedures (including critical care time)  Medications Ordered in ED Medications - No data to display   Initial Impression / Assessment and Plan / ED Course  I have reviewed the triage vital signs and the nursing notes.  Pertinent labs & imaging results that were available during my care of the patient were reviewed by me and considered in my medical decision making (see chart for details).     62 year old male with a history of anxiety, depression, and HTN presenting with a skin lesion over the glabella  that began approximately 3 weeks ago.  No constitutional symptoms.  The patient is previously been assessed by his PCP on 02/07/2018 and 02/14/2018.  Is treated the area with topical antibiotics and a course of Keflex with no improvement.  He was given a referral to dermatology, but is unable to schedule an appointment until mid-September.  The patient is requesting a diagnosis or an x-ray of his head today to determine if it is skin cancer.  Lengthy educational conversation regarding treatment of skin lesions and biopsies.  At this time, doubt infectious etiology  such as cellulitis, abscess, or impetigo.  Doubt fungal etiology.  Will provide the patient with multiple dermatology referrals as he is quite anxious about the lesion.  He is also requesting a work note.  Strict return precautions given.  He is hemodynamically stable and in no acute distress.  He is safe for discharge home with outpatient follow-up to dermatology.  Final Clinical Impressions(s) / ED Diagnoses   Final diagnoses:  Skin lesion of face    ED Discharge Orders    None       Maddux Vanscyoc A, PA-C 02/20/18 0950    Malvin Johns, MD 02/20/18 [phone]

## 2018-03-22 ENCOUNTER — Other Ambulatory Visit: Payer: Self-pay

## 2018-07-31 ENCOUNTER — Emergency Department (HOSPITAL_COMMUNITY): Payer: BC Managed Care – PPO

## 2018-07-31 ENCOUNTER — Emergency Department (HOSPITAL_COMMUNITY)
Admission: EM | Admit: 2018-07-31 | Discharge: 2018-07-31 | Disposition: A | Discharge status: Home / Self Care | Payer: BC Managed Care – PPO | Attending: Emergency Medicine | Admitting: Emergency Medicine

## 2018-07-31 ENCOUNTER — Encounter (HOSPITAL_COMMUNITY): Payer: Self-pay

## 2018-07-31 DIAGNOSIS — R002 Palpitations: Secondary | ICD-10-CM | POA: Diagnosis present

## 2018-07-31 DIAGNOSIS — F17210 Nicotine dependence, cigarettes, uncomplicated: Secondary | ICD-10-CM | POA: Diagnosis not present

## 2018-07-31 DIAGNOSIS — Z7982 Long term (current) use of aspirin: Secondary | ICD-10-CM | POA: Insufficient documentation

## 2018-07-31 DIAGNOSIS — J449 Chronic obstructive pulmonary disease, unspecified: Secondary | ICD-10-CM | POA: Insufficient documentation

## 2018-07-31 DIAGNOSIS — I10 Essential (primary) hypertension: Secondary | ICD-10-CM | POA: Insufficient documentation

## 2018-07-31 DIAGNOSIS — Z79899 Other long term (current) drug therapy: Secondary | ICD-10-CM | POA: Insufficient documentation

## 2018-07-31 LAB — BASIC METABOLIC PANEL
Anion gap: 6 (ref 5–15)
BUN: 9 mg/dL (ref 8–23)
CO2: 28 mmol/L (ref 22–32)
CREATININE: 0.76 mg/dL (ref 0.61–1.24)
Calcium: 9.5 mg/dL (ref 8.9–10.3)
Chloride: 108 mmol/L (ref 98–111)
GFR calc Af Amer: >60 mL/min (ref >60)
GFR calc non Af Amer: >60 mL/min (ref >60)
Glucose, Bld: 95 mg/dL (ref 70–99)
Potassium: 4.2 mmol/L (ref 3.5–5.1)
Sodium: 142 mmol/L (ref 135–145)

## 2018-07-31 LAB — CBC
HCT: 47.7 % (ref 39.0–52.0)
Hemoglobin: 16.1 g/dL (ref 13.0–17.0)
MCH: 32.2 pg (ref 26.0–34.0)
MCHC: 33.8 g/dL (ref 30.0–36.0)
MCV: 95.4 fL (ref 80.0–100.0)
Platelets: 198 10*3/uL (ref 150–400)
RBC: 5 MIL/uL (ref 4.22–5.81)
RDW: 12 % (ref 11.5–15.5)
WBC: 11.1 10*3/uL — ABNORMAL HIGH (ref 4.0–10.5)
nRBC: 0 % (ref 0.0–0.2)

## 2018-07-31 LAB — I-STAT TROPONIN, ED: Troponin i, poc: 0 ng/mL (ref 0.00–0.08)

## 2018-07-31 MED ORDER — SODIUM CHLORIDE 0.9% FLUSH: 3.0000 mL | Freq: Once | INTRAVENOUS | Status: DC

## 2018-07-31 NOTE — Discharge Instructions (Signed)
Your work-up in the emergency department today was reassuring.  We recommend follow-up with your primary care doctor as needed.  Return for new or concerning symptoms such as palpitations with associated chest pain, shortness of breath, lightheadedness, or sweatiness/clamminess.

## 2018-07-31 NOTE — ED Triage Notes (Signed)
Patient arrived by [facility] for an episode of palpitations this am. Denies pain, no associated symptoms. Does report that the episode he had was worse with movement, NAD

## 2018-07-31 NOTE — ED Notes (Signed)
Patient verbalizes understanding of discharge instructions. Opportunity for questioning and answers were provided. Armband removed by staff, pt discharged from ED ambulatory.   

## 2018-07-31 NOTE — ED Notes (Signed)
No reply for VS at this time. Did not check in [place].

## 2018-08-01 NOTE — ED Provider Notes (Signed)
[place] EMERGENCY DEPARTMENT Provider Note   CSN: [id number] Arrival date & time: 07/31/18  1504     History   Chief Complaint Chief Complaint  Patient presents with  . Palpitations    HPI Roger [name] is a 63 y.o. male.  63 year old male with a history of hypertension, COPD, anxiety presents to the emergency department for evaluation of palpitations.  Patient characterizes his palpitations as the sensation of his heart "skipping a beat".  Never felt as though his heart was beating rapidly.  He experienced no chest pain, shortness of breath, lightheadedness, syncope or near syncope, diaphoresis, nausea, vomiting with his symptoms.  No associated fevers.  He did not take any medications prior to arrival for his symptoms and denies any use of stimulants or caffeine.  His symptoms spontaneously subsided upon arrival to the emergency department.  He has been asymptomatic while waiting in triage.  The patient does note some increased family stress with his sister and brother both being ill.  He also lost 3 of his brothers in the past 3 years to cancer.  He is a 0.5 pack/day smoker.  Denies use of illicit drugs and alcohol.    Palpitations    Past Medical History:  Diagnosis Date  . Anxiety   . Asthma   . COPD (chronic obstructive pulmonary disease) ([place])   . Depression   . Hemorrhoids   . History of rectal bleeding   . Hypertension   . Pneumonia    in 2015  . Post-operative nausea and vomiting     Patient Active Problem List   Diagnosis Date Noted  . COPD GOLD 0 still smoking  07/13/2015  . Hemoptysis 06/11/2015  . CAP (community acquired pneumonia) 06/11/2015  . Lung nodules 06/11/2015  . Cigarette smoker 03/19/2014  . Generalized anxiety disorder 11/09/2011    Past Surgical History:  Procedure Laterality Date  . DENTAL SURGERY    . NOSE SURGERY    . TONSILLECTOMY          Home Medications    Prior to Admission medications   Medication  Sig Start Date End Date Taking? Authorizing Provider  ALPRAZolam Duanne Moron) 1 MG tablet Take 1 mg by mouth 5 (five) times daily.   Yes [provider]  aspirin EC 81 MG tablet Take 162 mg by mouth daily.    Yes [provider]  atenolol (TENORMIN) 50 MG tablet Take 50 mg by mouth daily.    Yes [provider]  atorvastatin (LIPITOR) 10 MG tablet Take 10 mg by mouth daily at 6 PM.    Yes [provider]  ibuprofen (ADVIL,MOTRIN) 200 MG tablet Take 400 mg by mouth every 6 (six) hours as needed for headache, mild pain or moderate pain.    Yes [provider]  mirtazapine (REMERON) 15 MG tablet Take 15 mg by mouth at bedtime.   Yes [provider]  levofloxacin (LEVAQUIN) 500 MG tablet Take 1 tablet (500 mg total) by mouth daily. Patient not taking: Reported on 07/31/2018 01/30/17   Tanda Rockers, MD    Family History Family History  Problem Relation Age of Onset  . Anxiety disorder Mother   . Depression Mother   . Lung disease Mother        "arthritic lung"  . Anxiety disorder Sister   . Depression Sister   . Anxiety disorder Brother   . Lung disease Father   . Lung cancer Father   . Anxiety  disorder Sister   . Anxiety disorder Brother   . Anxiety disorder Brother   . Anxiety disorder Brother   . Colon cancer Brother     Social History Social History   Tobacco Use  . Smoking status: Current Every Day Smoker    Packs/day: 0.50    Years: 25.00    Pack years: 12.50    Types: Cigarettes  . Smokeless tobacco: Never Used  Substance Use Topics  . Alcohol use: No    Alcohol/week: 0.0 standard drinks  . Drug use: No     Allergies   Demerol; Erythromycin; Prednisone; and Sulfa antibiotics   Review of Systems Review of Systems  Cardiovascular: Positive for palpitations.  Ten systems reviewed and are negative for acute change, except as noted in the HPI.    Physical Exam Updated Vital Signs BP 128/73   Pulse (!) 50    Temp 98 F (36.7 C)   Resp 15   SpO2 99%   Physical Exam Vitals signs and nursing note reviewed.  Constitutional:      General: He is not in acute distress.    Appearance: He is well-developed. He is not diaphoretic.     Comments: Nontoxic appearing, pleasant, in NAD  HENT:     Head: Normocephalic and atraumatic.  Eyes:     General: No scleral icterus.    Conjunctiva/sclera: Conjunctivae normal.  Neck:     Musculoskeletal: Normal range of motion.  Cardiovascular:     Rate and Rhythm: Regular rhythm. Bradycardia present.     Pulses: Normal pulses.  Pulmonary:     Effort: Pulmonary effort is normal. No respiratory distress.     Breath sounds: No stridor. No wheezing or rales.     Comments: Lungs CTAB. Respirations even and unlabored. Musculoskeletal: Normal range of motion.  Skin:    General: Skin is warm and dry.     Coloration: Skin is not pale.     Findings: No erythema or rash.  Neurological:     Mental Status: He is alert and oriented to person, place, and time.     Coordination: Coordination normal.  Psychiatric:        Behavior: Behavior normal.      ED Treatments / Results  Labs (all labs ordered are listed, but only abnormal results are displayed) Labs Reviewed  CBC - Abnormal; Notable for the following components:      Result Value   WBC 11.1 (*)    All other components within normal limits  BASIC METABOLIC PANEL  I-STAT TROPONIN, ED    EKG EKG Interpretation  Date/Time:  Tuesday July 31 2018 15:25:58 EST Ventricular Rate:  66 PR Interval:  138 QRS Duration: 90 QT Interval:  394 QTC Calculation: 413 R Axis:   88 Text Interpretation:  Sinus rhythm with occasional Premature ventricular complexes with ventricular escape complexes Otherwise normal ECG agree. no change from previous except PVC Confirmed by Charlesetta Shanks [phone]) on 08/01/2018 12:27:46 AM   Radiology Dg Chest 2 View  Result Date: 07/31/2018 CLINICAL DATA:  Chest pain and  palpitations this morning. EXAM: CHEST - 2 VIEW COMPARISON:  CT chest 01/27/2017 and 05/31/2017. PA and lateral chest 01/17/2017 and 07/13/2015. FINDINGS: The lungs are emphysematous. Hazy airspace opacity in the right upper lobe is chronic and unchanged. Heart size is normal. No pneumothorax or pleural effusion. No acute or focal bony abnormality. IMPRESSION: Emphysema without acute disease. Electronically Signed   By: Inge Rise M.D.   On: 07/31/2018  16:02    Procedures Procedures (including critical care time)  Medications Ordered in ED Medications  sodium chloride flush (NS) 0.9 % injection 3 mL (0 mLs Intravenous Hold 07/31/18 2246)     Initial Impression / Assessment and Plan / ED Course  I have reviewed the triage vital signs and the nursing notes.  Pertinent labs & imaging results that were available during my care of the patient were reviewed by me and considered in my medical decision making (see chart for details).     Patient presenting for palpitations which began this morning and spontaneously subsided upon arrival to the ED.  He symptomatic and denies any associated chest pain, shortness of breath, lightheadedness, diaphoresis.  Further denies use of stimulants, drugs, alcohol.    Laboratory work-up reassuring with no electrolyte derangements, negative troponin.  His EKG is notable for a PVC.  He is bradycardic while in the ED, but chart review shows normal resting heart rate between 42 and 60 bpm.    I suspect that his palpitations are due to situational stress and anxiety.  He does have a history of anxiety for which he takes Xanax.  I do not believe further emergent work-up is indicated at this time.  He has been encouraged to follow-up with his primary care doctor for repeat assessment.  Return precautions discussed and provided. Patient discharged in stable condition with no unaddressed concerns.   Final Clinical Impressions(s) / ED Diagnoses   Final diagnoses:    Palpitations    ED Discharge Orders    None       Antonietta Breach, PA-C 08/01/18 0031    Charlesetta Shanks, MD 08/05/18 [phone]

## 2018-08-07 ENCOUNTER — Telehealth: Payer: Self-pay

## 2018-08-07 NOTE — Telephone Encounter (Signed)
Sent referral to scheduling and file notes

## 2018-09-19 ENCOUNTER — Telehealth: Payer: Self-pay

## 2018-09-19 NOTE — Telephone Encounter (Signed)
Spoke with the patient, he stated that he is doing well and will wait, until a month to be seen. Advised if his symptoms worsen to contact our office.

## 2018-09-19 NOTE — Telephone Encounter (Signed)
New Message   Patient returning Ben's phone call.

## 2018-09-19 NOTE — Telephone Encounter (Signed)
Left message to call back  

## 2018-09-20 ENCOUNTER — Ambulatory Visit: Payer: BC Managed Care – PPO | Admitting: Cardiology

## 2018-10-08 NOTE — Telephone Encounter (Signed)
Called and spoke with patient about scheduling telephone/video visit. He stated he is doing fine and would like to wait for an office visit. Also stated he has no Internet or smart phone to do a visit anyway.

## 2018-10-17 ENCOUNTER — Telehealth: Payer: Self-pay

## 2018-10-17 DIAGNOSIS — I10 Essential (primary) hypertension: Secondary | ICD-10-CM | POA: Insufficient documentation

## 2018-10-17 DIAGNOSIS — I493 Ventricular premature depolarization: Secondary | ICD-10-CM | POA: Insufficient documentation

## 2018-10-17 DIAGNOSIS — R002 Palpitations: Secondary | ICD-10-CM | POA: Insufficient documentation

## 2018-10-17 NOTE — Progress Notes (Signed)
Virtual Visit via Telephone Note   This visit type was conducted due to national recommendations for restrictions regarding the COVID-19 Pandemic (e.g. social distancing) in an effort to limit this patient's exposure and mitigate transmission in our community.  Due to his co-morbid illnesses, this patient is at least at moderate risk for complications without adequate follow up.  This format is felt to be most appropriate for this patient at this time.  The patient did not have access to video technology/had technical difficulties with video requiring transitioning to audio format only (telephone).  All issues noted in this document were discussed and addressed.  No physical exam could be performed with this format.  Please refer to the patient's chart for his  consent to telehealth for [facility].  Evaluation Performed:  Follow-up visit  This visit type was conducted due to national recommendations for restrictions regarding the COVID-19 Pandemic (e.g. social distancing).  This format is felt to be most appropriate for this patient at this time.  All issues noted in this document were discussed and addressed.  No physical exam was performed (except for noted visual exam findings with Video Visits).  Please refer to the patient's chart (MyChart message for video visits and phone note for telephone visits) for the patient's consent to telehealth for [facility].  Date:  10/18/2018   ID:  Roger [name], DOB May 18, 1956, MRN [id number]  Patient Location:  Home  Provider location:   [place]  PCP:  Aletha Halim., PA-C  Cardiologist:  NEW Electrophysiologist:  None   Chief Complaint:  Palpitations  History of Present Illness:    Roger [name] is a 63 y.o. male who presents via audio/video conferencing for a telehealth visit today in referral by Bing Matter, PA.    This is a 63yo male with a history of COPD, HTN and anxiety.  He presented to the ER 07/31/2018 with complaints of  palpitations.  He says it felt like his heart was skipping.  He has not had any chest pain or pressure, SOB, DOE, PND, orthopnea, dizziness or syncope.  He denies any nausea or diaphoresis with the palpitations and no dizziness.  In the ER he denied taking stimulants or caffeine and no OTC stimulants.  By the time he got to the Er his symptoms had resolved.  He admitted to having a lot of stressors in his life.  Apparently his sister and brother were ill and he has lost 3 brother in the past 3 years to cancer.  He does smoke 1/2 ppd but denied any ETOH use or drugs.  In ER labs were normal and EKG showed NSR with PVC and with normal intervals and no ST changes. 2D echo in 2010 was normal and nuclear stress test 2010 showed no ischemia.    The patient does not have symptoms concerning for COVID-19 infection (fever, chills, cough, or new shortness of breath).    Prior CV studies:   The following studies were reviewed today:  2D echo and stress test 2010 and labs from 07/2018  Past Medical History:  Diagnosis Date  . Anxiety   . Asthma   . COPD (chronic obstructive pulmonary disease) ([place])   . Depression   . Hemorrhoids   . History of rectal bleeding   . Hypertension   . Palpitations   . Pneumonia    in 2015  . Post-operative nausea and vomiting    Past Surgical History:  Procedure Laterality Date  . DENTAL SURGERY    .  NOSE SURGERY    . TONSILLECTOMY       Current Meds  Medication Sig  . albuterol (PROVENTIL HFA;VENTOLIN HFA) 108 (90 Base) MCG/ACT inhaler Inhale 2 puffs into the lungs every 6 (six) hours as needed for wheezing or shortness of breath.  . ALPRAZolam (XANAX) 1 MG tablet Take 1 mg by mouth 5 (five) times daily.  Marland Kitchen aspirin EC 81 MG tablet Take 162 mg by mouth daily.   Marland Kitchen atenolol (TENORMIN) 50 MG tablet Take 50 mg by mouth daily.   Marland Kitchen atorvastatin (LIPITOR) 10 MG tablet Take 10 mg by mouth daily at 6 PM.   . ibuprofen (ADVIL,MOTRIN) 200 MG tablet Take 400 mg by mouth  every 6 (six) hours as needed for headache, mild pain or moderate pain.   . mirtazapine (REMERON) 15 MG tablet Take 15 mg by mouth at bedtime.     Allergies:   Demerol; Erythromycin; Prednisone; Sulfa antibiotics; Augmentin [amoxicillin-pot clavulanate]; and Meperidine and related   Social History   Tobacco Use  . Smoking status: Current Every Day Smoker    Packs/day: 0.50    Years: 25.00    Pack years: 12.50    Types: Cigarettes  . Smokeless tobacco: Never Used  Substance Use Topics  . Alcohol use: No    Alcohol/week: 0.0 standard drinks  . Drug use: No     Family Hx: The patient's family history includes Anxiety disorder in his brother, brother, brother, brother, mother, sister, and sister; Colon cancer in his brother; Depression in his mother and sister; Lung cancer in his father; Lung disease in his father and mother.  ROS:   Please see the history of present illness.     All other systems reviewed and are negative.   Labs/Other Tests and Data Reviewed:    Recent Labs: 07/31/2018: BUN 9; Creatinine, Ser 0.76; Hemoglobin 16.1; Platelets 198; Potassium 4.2; Sodium 142   Recent Lipid Panel Lab Results  Component Value Date/Time   CHOL  05/15/2009 05:00 AM    147        ATP III CLASSIFICATION:  <200     mg/dL   Desirable  200-239  mg/dL   Borderline High  >=240    mg/dL   High          TRIG 132 05/15/2009 05:00 AM   HDL 27 (L) 05/15/2009 05:00 AM   CHOLHDL 5.4 05/15/2009 05:00 AM   LDLCALC  05/15/2009 05:00 AM    94        Total Cholesterol/HDL:CHD Risk Coronary Heart Disease Risk Table                     Men   Women  1/2 Average Risk   3.4   3.3  Average Risk       5.0   4.4  2 X Average Risk   9.6   7.1  3 X Average Risk  23.4   11.0        Use the calculated Patient Ratio above and the CHD Risk Table to determine the patient's CHD Risk.        ATP III CLASSIFICATION (LDL):  <100     mg/dL   Optimal  100-129  mg/dL   Near or Above                     Optimal  130-159  mg/dL   Borderline  160-189  mg/dL   High  >190  mg/dL   Very High    Wt Readings from Last 3 Encounters:  10/18/18 171 lb (77.6 kg)  02/20/18 167 lb 7 oz (75.9 kg)  01/30/17 174 lb (78.9 kg)     Objective:    Vital Signs:  Ht 5\' 11"  (1.803 m)   Wt 171 lb (77.6 kg)   BMI 23.85 kg/m    CONSTITUTIONAL:  Well nourished, well developed male in no acute distress.  EYES: anicteric MOUTH: oral mucosa is pink RESPIRATORY: Normal respiratory effort, symmetric expansion CARDIOVASCULAR: No peripheral edema SKIN: No rash, lesions or ulcers MUSCULOSKELETAL: no digital cyanosis NEURO: Cranial Nerves II-XII grossly intact, moves all extremities PSYCH: Intact judgement and insight.  A&O x 3, Mood/affect appropriate   ASSESSMENT & PLAN:    1.  Palpitations - these are likely PVCs as noted on EKG.  He was asymptomatic by the time he reached the ER.  Labs were normal.  EKG with no ST changes. This may have been related to anxiety as we was worried at the time these occurred and has not had any since then.  I will get an event monitor (long term Ziopatch) to assess for other arrthymias and assess PVC load.   2.  PVCs - EKG in ER showed an isolated PVC.  I will get an Zipatch to assess for PVC load.  ALso check a 2D echo to assess LVF.  This can wait until late June or July due to Covid 19 crisis.   3.  HTN - he checks his BP at home and it has been controlled.  It usually runs around 122/15mmHg.  He will continue on atenolol 50mg  daily.  4.  COVID-19 Education:The signs and symptoms of COVID-19 were discussed with the patient and how to seek care for testing (follow up with PCP or arrange E-visit).  The importance of social distancing was discussed today.  Patient Risk:   After full review of this patient's clinical status, I feel that they are at least moderate risk at this time.  Time:   Today, I have spent 30 minutes directly with the patient on telephone discussing  medical problems including palpitations, PVCs.  We also reviewed the symptoms of COVID 19 and the ways to protect against contracting the virus with telehealth technology.  I spent an additional 15 minutes reviewing patient's chart including ER records, reviewing prior 2D echo and EKGs and lab from recent ER visit.   Medication Adjustments/Labs and Tests Ordered: Current medicines are reviewed at length with the patient today.  Concerns regarding medicines are outlined above.  Tests Ordered: No orders of the defined types were placed in this encounter.  Medication Changes: No orders of the defined types were placed in this encounter.   Disposition:  Follow up prn  Signed, Fransico Him, MD  10/18/2018 11:46 AM    [place]

## 2018-10-17 NOTE — Telephone Encounter (Signed)
Unable to reach to set up doximity for 10/18/18 appointment.

## 2018-10-18 ENCOUNTER — Encounter: Payer: Self-pay | Admitting: Cardiology

## 2018-10-18 ENCOUNTER — Telehealth (INDEPENDENT_AMBULATORY_CARE_PROVIDER_SITE_OTHER): Payer: BC Managed Care – PPO | Admitting: Cardiology

## 2018-10-18 ENCOUNTER — Other Ambulatory Visit: Payer: Self-pay

## 2018-10-18 VITALS — Ht 71.0 in | Wt 171.0 lb

## 2018-10-18 DIAGNOSIS — Z7189 Other specified counseling: Secondary | ICD-10-CM

## 2018-10-18 DIAGNOSIS — R002 Palpitations: Secondary | ICD-10-CM

## 2018-10-18 DIAGNOSIS — I10 Essential (primary) hypertension: Secondary | ICD-10-CM | POA: Diagnosis not present

## 2018-10-18 DIAGNOSIS — I493 Ventricular premature depolarization: Secondary | ICD-10-CM | POA: Diagnosis not present

## 2018-10-18 NOTE — Telephone Encounter (Signed)
Virtual Visit Pre-Appointment Phone Call  Steps For Call:  1. Confirm consent - "In the setting of the current Covid19 crisis, you are scheduled for a (phone or video) visit with your provider on (date) at (time).  Just as we do with many in-office visits, in order for you to participate in this visit, we must obtain consent.  If you'd like, I can send this to your mychart (if signed up) or email for you to review.  Otherwise, I can obtain your verbal consent now.  All virtual visits are billed to your insurance company just like a normal visit would be.  By agreeing to a virtual visit, we'd like you to understand that the technology does not allow for your provider to perform an examination, and thus may limit your provider's ability to fully assess your condition.  Finally, though the technology is pretty good, we cannot assure that it will always work on either your or our end, and in the setting of a video visit, we may have to convert it to a phone-only visit.  In either situation, we cannot ensure that we have a secure connection.  Are you willing to proceed?" STAFF: Did the patient verbally acknowledge consent to telehealth visit? Document YES/NO here: YES, by Gwyndolyn Saxon on 10/17/18  2. Confirm the BEST phone number to call the day of the visit by including in appointment notes  3. Give patient instructions for WebEx/MyChart download to smartphone as below or Doximity/Doxy.me if video visit (depending on what platform provider is using)  4. Advise patient to be prepared with their blood pressure, heart rate, weight, any heart rhythm information, their current medicines, and a piece of paper and pen handy for any instructions they may receive the day of their visit  5. Inform patient they will receive a phone call 15 minutes prior to their appointment time (may be from unknown caller ID) so they should be prepared to answer  6. Confirm that appointment type is correct in Epic appointment  notes (VIDEO vs PHONE)     TELEPHONE CALL NOTE  Roger [name] has been deemed a candidate for a follow-up tele-health visit to limit community exposure during the Covid-19 pandemic. I spoke with the patient via phone to ensure availability of phone/video source, confirm preferred email & phone number, and discuss instructions and expectations.  I reminded Roger [name] to be prepared with any vital sign and/or heart rhythm information that could potentially be obtained via home monitoring, at the time of his visit. I reminded Roger [name] to expect a phone call at the time of his visit if his visit.  Sarina Ill, RN 10/18/2018 9:25 AM   INSTRUCTIONS FOR DOWNLOADING THE [place] APP TO SMARTPHONE  - If Apple, ask patient to go to App Store and type in WebEx in the search bar. [place] [employer], the blue/green circle. If Android, go to [employer] and type in [employer] in the search bar. The app is free but as with any other app downloads, their phone may require them to verify saved payment information or Apple/Android password.  - The patient does NOT have to create an account. - On the day of the visit, the assist will walk the patient through joining the meeting with the meeting number/password.  INSTRUCTIONS FOR DOWNLOADING THE MYCHART APP TO SMARTPHONE  - The patient must first make sure to have activated MyChart and know their login information - If Apple, go  to [employer] and type in MyChart in the search bar and download the app. If Android, ask patient to go to [employer] and type in [place] in the search bar and download the app. The app is free but as with any other app downloads, their phone may require them to verify saved payment information or Apple/Android password.  - The patient will need to then log into the app with their MyChart username and password, and select [facility] as their healthcare provider to link the account. When it is time for your visit,  go to the MyChart app, find appointments, and click Begin Video Visit. Be sure to Select Allow for your device to access the Microphone and Camera for your visit. You will then be connected, and your provider will be with you shortly.  **If they have any issues connecting, or need assistance please contact MyChart service desk [phone])**  **If using a computer, in order to ensure the best quality for their visit they will need to use either of the following Internet Browsers: [employer], or Google Chrome**  IF USING DOXIMITY or DOXY.ME - The patient will receive a link just prior to their visit, either by text or email (to be determined day of appointment depending on if it's doxy.me or Doximity).     FULL LENGTH CONSENT FOR TELE-HEALTH VISIT   I hereby voluntarily request, consent and authorize [facility] and its employed or contracted physicians, physician assistants, nurse practitioners or other licensed health care professionals (the Practitioner), to provide me with telemedicine health care services (the "Services") as deemed necessary by the treating Practitioner. I acknowledge and consent to receive the Services by the Practitioner via telemedicine. I understand that the telemedicine visit will involve communicating with the Practitioner through live audiovisual communication technology and the disclosure of certain medical information by electronic transmission. I acknowledge that I have been given the opportunity to request an in-person assessment or other available alternative prior to the telemedicine visit and am voluntarily participating in the telemedicine visit.  I understand that I have the right to withhold or withdraw my consent to the use of telemedicine in the course of my care at any time, without affecting my right to future care or treatment, and that the Practitioner or I may terminate the telemedicine visit at any time. I understand that I have the  right to inspect all information obtained and/or recorded in the course of the telemedicine visit and may receive copies of available information for a reasonable fee.  I understand that some of the potential risks of receiving the Services via telemedicine include:  Marland Kitchen Delay or interruption in medical evaluation due to technological equipment failure or disruption; . Information transmitted may not be sufficient (e.g. poor resolution of images) to allow for appropriate medical decision making by the Practitioner; and/or  . In rare instances, security protocols could fail, causing a breach of personal health information.  Furthermore, I acknowledge that it is my responsibility to provide information about my medical history, conditions and care that is complete and accurate to the best of my ability. I acknowledge that Practitioner's advice, recommendations, and/or decision may be based on factors not within their control, such as incomplete or inaccurate data provided by me or distortions of diagnostic images or specimens that may result from electronic transmissions. I understand that the practice of medicine is not an exact science and that Practitioner makes no warranties or guarantees regarding treatment outcomes. I acknowledge  that I will receive a copy of this consent concurrently upon execution via email to the email address I last provided but may also request a printed copy by calling the office of [facility].    I understand that my insurance will be billed for this visit.   I have read or had this consent read to me. . I understand the contents of this consent, which adequately explains the benefits and risks of the Services being provided via telemedicine.  . I have been provided ample opportunity to ask questions regarding this consent and the Services and have had my questions answered to my satisfaction. . I give my informed consent for the services to be provided through the use of  telemedicine in my medical care  By participating in this telemedicine visit I agree to the above.

## 2018-10-18 NOTE — Patient Instructions (Signed)
Medication Instructions:  Your physician recommends that you continue on your current medications as directed. Please refer to the Current Medication list given to you today.  If you need a refill on your cardiac medications before your next appointment, please call your pharmacy.   Lab work: None If you have labs (blood work) drawn today and your tests are completely normal, you will receive your results only by: Marland Kitchen MyChart Message (if you have MyChart) OR . A paper copy in the mail If you have any lab test that is abnormal or we need to change your treatment, we will call you to review the results.  Testing/Procedures: Your physician has requested that you have an echocardiogram. Echocardiography is a painless test that uses sound waves to create images of your heart. It provides your doctor with information about the size and shape of your heart and how well your heart's chambers and valves are working. This procedure takes approximately one hour. There are no restrictions for this procedure.  Your physician has recommended that you wear an Zio Patch monitor, 14 day.   Follow-Up: As needed.

## 2018-10-25 ENCOUNTER — Ambulatory Visit: Payer: BC Managed Care – PPO | Admitting: Cardiology

## 2018-10-30 ENCOUNTER — Encounter: Payer: Self-pay | Admitting: Cardiology

## 2018-10-30 NOTE — Telephone Encounter (Signed)
New Message   Pt is calling and would like for Suezanne Jacquet to return his call

## 2018-11-02 NOTE — Telephone Encounter (Signed)
Error

## 2018-11-06 ENCOUNTER — Telehealth: Payer: Self-pay | Admitting: Cardiology

## 2018-11-06 NOTE — Telephone Encounter (Signed)
Patient states that he was contacted about sending him a monitor but he says he spoke to [place] and was told he did not need to do it. Please let patient know if this has changed.

## 2018-11-07 NOTE — Telephone Encounter (Signed)
Spoke with the patient, advised him that I cancelled the order since he did not feel like he needed it.

## 2019-01-02 ENCOUNTER — Telehealth: Payer: Self-pay | Admitting: Cardiology

## 2019-01-02 NOTE — Telephone Encounter (Signed)
New Message           Patient is calling to see if Suezanne Jacquet would give him a call at his earliest convince.

## 2019-01-03 ENCOUNTER — Other Ambulatory Visit (HOSPITAL_COMMUNITY): Payer: BC Managed Care – PPO

## 2019-01-03 NOTE — Telephone Encounter (Signed)
Spoke with the patient, he had questions about his upcoming echo. I explained the reasoning and he had no further questions.

## 2019-01-11 ENCOUNTER — Telehealth: Payer: Self-pay | Admitting: Cardiology

## 2019-01-11 ENCOUNTER — Other Ambulatory Visit (HOSPITAL_COMMUNITY): Payer: BC Managed Care – PPO

## 2019-01-11 NOTE — Telephone Encounter (Signed)
I spoke with pt. He has work conflict and needs to change echo to next Friday. Echo rescheduled for July 17,2020 at 12:50.  Pt also asking what echo entails and I went over this information with him.

## 2019-01-11 NOTE — Telephone Encounter (Signed)
New message:    Patient calling concerning his ECHO today and would like for some one to call him before his appt.

## 2019-01-18 ENCOUNTER — Telehealth: Payer: Self-pay | Admitting: Cardiology

## 2019-01-18 ENCOUNTER — Other Ambulatory Visit (HOSPITAL_COMMUNITY): Payer: BC Managed Care – PPO

## 2019-01-18 NOTE — Telephone Encounter (Signed)
  Patient would like to speak to [facility] when he has time

## 2019-01-18 NOTE — Telephone Encounter (Signed)
Spoke with the patient, he stated he would call back soon to schedule his echo.

## 2021-03-30 ENCOUNTER — Other Ambulatory Visit: Payer: Self-pay

## 2021-03-30 ENCOUNTER — Emergency Department (HOSPITAL_BASED_OUTPATIENT_CLINIC_OR_DEPARTMENT_OTHER)
Admission: EM | Admit: 2021-03-30 | Discharge: 2021-03-30 | Disposition: A | Discharge status: Home / Self Care | Payer: BC Managed Care – PPO | Attending: Emergency Medicine | Admitting: Emergency Medicine

## 2021-03-30 ENCOUNTER — Encounter (HOSPITAL_BASED_OUTPATIENT_CLINIC_OR_DEPARTMENT_OTHER): Payer: Self-pay | Admitting: *Deleted

## 2021-03-30 DIAGNOSIS — K029 Dental caries, unspecified: Secondary | ICD-10-CM | POA: Diagnosis not present

## 2021-03-30 DIAGNOSIS — F17210 Nicotine dependence, cigarettes, uncomplicated: Secondary | ICD-10-CM | POA: Insufficient documentation

## 2021-03-30 DIAGNOSIS — J449 Chronic obstructive pulmonary disease, unspecified: Secondary | ICD-10-CM | POA: Insufficient documentation

## 2021-03-30 DIAGNOSIS — J45909 Unspecified asthma, uncomplicated: Secondary | ICD-10-CM | POA: Diagnosis not present

## 2021-03-30 DIAGNOSIS — K047 Periapical abscess without sinus: Secondary | ICD-10-CM | POA: Insufficient documentation

## 2021-03-30 DIAGNOSIS — I10 Essential (primary) hypertension: Secondary | ICD-10-CM | POA: Insufficient documentation

## 2021-03-30 DIAGNOSIS — K0889 Other specified disorders of teeth and supporting structures: Secondary | ICD-10-CM | POA: Diagnosis present

## 2021-03-30 DIAGNOSIS — Z7982 Long term (current) use of aspirin: Secondary | ICD-10-CM | POA: Insufficient documentation

## 2021-03-30 MED ORDER — CEPHALEXIN 250 MG PO CAPS: 500.0000 mg | ORAL_CAPSULE | Freq: Once | ORAL | Status: DC

## 2021-03-30 MED ORDER — ACETAMINOPHEN 500 MG PO TABS: 1000.0000 mg | ORAL_TABLET | Freq: Once | ORAL | Status: DC

## 2021-03-30 MED ORDER — CEPHALEXIN 500 MG PO CAPS: 500.0000 mg | ORAL_CAPSULE | Freq: Four times a day (QID) | ORAL | 0 refills | Status: DC

## 2021-03-30 NOTE — ED Notes (Signed)
Patient verbalizes understanding of discharge instructions. Opportunity for questioning and answers were provided. Patient discharged from ED.  °

## 2021-03-30 NOTE — ED Triage Notes (Signed)
Lower tooth pain started yesterday

## 2021-03-30 NOTE — ED Provider Notes (Signed)
[facility] EMERGENCY DEPT Provider Note   CSN: [id number] Arrival date & time: 03/30/21  1005     History Chief Complaint  Patient presents with   Dental Pain    Roger [name] is a 65 y.o. male.  Pt with left lower dental pain and swelling. States is mostly edentulous but has a few remaining teeth. Symptoms acute onset onset few days, constant, dull, moderate, non radiating. No sore throat or trouble breathing or swallowing. No neck pain or swelling. No fever or chills. No current local dentist.   The history is provided by the patient and medical records.  Dental Pain Associated symptoms: no fever       Past Medical History:  Diagnosis Date   Anxiety    Asthma    COPD (chronic obstructive pulmonary disease) ([place])    Depression    Hemorrhoids    History of rectal bleeding    Hypertension    Palpitations    Pneumonia    in 2015   Post-operative nausea and vomiting     Patient Active Problem List   Diagnosis Date Noted   Palpitations 10/17/2018   PVC (premature ventricular contraction) 10/17/2018   Essential hypertension 10/17/2018   COPD GOLD 0 still smoking  07/13/2015   Hemoptysis 06/11/2015   CAP (community acquired pneumonia) 06/11/2015   Lung nodules 06/11/2015   Cigarette smoker 03/19/2014   Generalized anxiety disorder 11/09/2011    Past Surgical History:  Procedure Laterality Date   DENTAL SURGERY     NOSE SURGERY     TONSILLECTOMY         Family History  Problem Relation Age of Onset   Anxiety disorder Mother    Depression Mother    Lung disease Mother        "arthritic lung"   Anxiety disorder Sister    Depression Sister    Anxiety disorder Brother    Lung disease Father    Lung cancer Father    Anxiety disorder Sister    Anxiety disorder Brother    Anxiety disorder Brother    Anxiety disorder Brother    Colon cancer Brother     Social History   Tobacco Use   Smoking status: Every Day    Packs/day: 0.50     Years: 25.00    Pack years: 12.50    Types: Cigarettes   Smokeless tobacco: Never  Vaping Use   Vaping Use: Never used  Substance Use Topics   Alcohol use: Never   Drug use: Never    Home Medications Prior to Admission medications   Medication Sig Start Date End Date Taking? Authorizing Provider  ALPRAZolam Duanne Moron) 1 MG tablet Take 1 mg by mouth 5 (five) times daily.   Yes [provider]  aspirin EC 81 MG tablet Take 162 mg by mouth daily.    Yes [provider]  atenolol (TENORMIN) 50 MG tablet Take 50 mg by mouth daily.    Yes [provider]  atorvastatin (LIPITOR) 10 MG tablet Take 10 mg by mouth daily at 6 PM.    Yes [provider]  mirtazapine (REMERON) 15 MG tablet Take 15 mg by mouth at bedtime.   Yes [provider]  albuterol (PROVENTIL HFA;VENTOLIN HFA) 108 (90 Base) MCG/ACT inhaler Inhale 2 puffs into the lungs every 6 (six) hours as needed for wheezing or shortness of breath.    [provider]  ibuprofen (ADVIL,MOTRIN) 200 MG tablet Take 400 mg by mouth every  6 (six) hours as needed for headache, mild pain or moderate pain.     [provider]    Allergies    Demerol, Erythromycin, Prednisone, Sulfa antibiotics, Augmentin [amoxicillin-pot clavulanate], and Meperidine and related  Review of Systems   Review of Systems  Constitutional:  Negative for fever.  HENT:  Positive for dental problem. Negative for sore throat and trouble swallowing.   Respiratory:  Negative for shortness of breath.    Physical Exam Updated Vital Signs BP (!) 146/71 (BP Location: Right Arm)   Pulse 63   Temp 97.8 F (36.6 C) (Oral)   Resp 16   Ht 1.822 m (5' 11.75")   Wt 77.1 kg   SpO2 99%   BMI 23.22 kg/m   Physical Exam Vitals and nursing note reviewed.  Constitutional:      Appearance: Normal appearance. He is well-developed.  HENT:     Head: Atraumatic.     Nose: Nose normal.     Mouth/Throat:     Mouth: Mucous  membranes are moist.     Pharynx: Oropharynx is clear.     Comments: No swelling or tenderness to floor of mouth or neck. No trismus. Left lower tooth with decay, and associated gum swelling/tenderness, ?dental abscess.  Eyes:     General: No scleral icterus.    Conjunctiva/sclera: Conjunctivae normal.  Neck:     Trachea: No tracheal deviation.     Comments: Trachea midline. No neck swelling or mass.  Cardiovascular:     Rate and Rhythm: Normal rate.     Pulses: Normal pulses.  Pulmonary:     Effort: Pulmonary effort is normal. No accessory muscle usage or respiratory distress.     Breath sounds: No stridor.  Genitourinary:    Comments: No cva tenderness. Musculoskeletal:        General: No swelling.     Cervical back: Normal range of motion and neck supple. No rigidity.  Skin:    General: Skin is warm and dry.     Findings: No rash.  Neurological:     Mental Status: He is alert.     Comments: Alert, speech clear.   Psychiatric:        Mood and Affect: Mood normal.    ED Results / Procedures / Treatments   Labs (all labs ordered are listed, but only abnormal results are displayed) Labs Reviewed - No data to display  EKG None  Radiology No results found.  Procedures Procedures   Medications Ordered in ED Medications - No data to display  ED Course  I have reviewed the triage vital signs and the nursing notes.  Pertinent labs & imaging results that were available during my care of the patient were reviewed by me and considered in my medical decision making (see chart for details).    MDM Rules/Calculators/A&P                           Exam c/w dental abscess.   Antibiotic rx. Acetaminophen po.   Dental f/u.      Final Clinical Impression(s) / ED Diagnoses Final diagnoses:  None    Rx / DC Orders ED Discharge Orders     None        Lajean Saver, MD 03/30/21 1052

## 2021-03-30 NOTE — Discharge Instructions (Addendum)
It was our pleasure to provide your ER care today - we hope that you feel better.  Take antibiotic as prescribed.   Take acetaminophen or ibuprofen as need.   Follow up with dentist in the next couple days - call office today to arrange appointment.   Return to ER if worse, new symptoms, increased swelling, trouble breathing or swallowing, or other concern.

## 2021-04-13 ENCOUNTER — Other Ambulatory Visit: Payer: Self-pay

## 2021-04-13 ENCOUNTER — Emergency Department (HOSPITAL_BASED_OUTPATIENT_CLINIC_OR_DEPARTMENT_OTHER)
Admission: EM | Admit: 2021-04-13 | Discharge: 2021-04-13 | Disposition: A | Discharge status: Home / Self Care | Payer: BC Managed Care – PPO | Attending: Emergency Medicine | Admitting: Emergency Medicine

## 2021-04-13 ENCOUNTER — Encounter (HOSPITAL_BASED_OUTPATIENT_CLINIC_OR_DEPARTMENT_OTHER): Payer: Self-pay | Admitting: *Deleted

## 2021-04-13 ENCOUNTER — Emergency Department (HOSPITAL_BASED_OUTPATIENT_CLINIC_OR_DEPARTMENT_OTHER): Payer: BC Managed Care – PPO | Admitting: Radiology

## 2021-04-13 DIAGNOSIS — Z79899 Other long term (current) drug therapy: Secondary | ICD-10-CM | POA: Diagnosis not present

## 2021-04-13 DIAGNOSIS — J181 Lobar pneumonia, unspecified organism: Secondary | ICD-10-CM | POA: Diagnosis not present

## 2021-04-13 DIAGNOSIS — Z28310 Unvaccinated for covid-19: Secondary | ICD-10-CM | POA: Diagnosis not present

## 2021-04-13 DIAGNOSIS — Z87891 Personal history of nicotine dependence: Secondary | ICD-10-CM | POA: Diagnosis not present

## 2021-04-13 DIAGNOSIS — J449 Chronic obstructive pulmonary disease, unspecified: Secondary | ICD-10-CM | POA: Insufficient documentation

## 2021-04-13 DIAGNOSIS — J45909 Unspecified asthma, uncomplicated: Secondary | ICD-10-CM | POA: Diagnosis not present

## 2021-04-13 DIAGNOSIS — Z7982 Long term (current) use of aspirin: Secondary | ICD-10-CM | POA: Insufficient documentation

## 2021-04-13 DIAGNOSIS — Z20822 Contact with and (suspected) exposure to covid-19: Secondary | ICD-10-CM | POA: Diagnosis not present

## 2021-04-13 DIAGNOSIS — J189 Pneumonia, unspecified organism: Secondary | ICD-10-CM

## 2021-04-13 DIAGNOSIS — I10 Essential (primary) hypertension: Secondary | ICD-10-CM | POA: Diagnosis not present

## 2021-04-13 DIAGNOSIS — R059 Cough, unspecified: Secondary | ICD-10-CM | POA: Diagnosis present

## 2021-04-13 LAB — BASIC METABOLIC PANEL
Anion gap: 9 (ref 5–15)
BUN: 8 mg/dL (ref 8–23)
CO2: 24 mmol/L (ref 22–32)
Calcium: 8.9 mg/dL (ref 8.9–10.3)
Chloride: 99 mmol/L (ref 98–111)
Creatinine, Ser: 0.72 mg/dL (ref 0.61–1.24)
GFR, Estimated: >60 mL/min (ref >60)
Glucose, Bld: 234 mg/dL — ABNORMAL HIGH (ref 70–99)
Potassium: 3.5 mmol/L (ref 3.5–5.1)
Sodium: 132 mmol/L — ABNORMAL LOW (ref 135–145)

## 2021-04-13 LAB — CBC
HCT: 35.6 % — ABNORMAL LOW (ref 39.0–52.0)
Hemoglobin: 12.4 g/dL — ABNORMAL LOW (ref 13.0–17.0)
MCH: 31.8 pg (ref 26.0–34.0)
MCHC: 34.8 g/dL (ref 30.0–36.0)
MCV: 91.3 fL (ref 80.0–100.0)
Platelets: 176 10*3/uL (ref 150–400)
RBC: 3.9 MIL/uL — ABNORMAL LOW (ref 4.22–5.81)
RDW: 11.9 % (ref 11.5–15.5)
WBC: 12.1 10*3/uL — ABNORMAL HIGH (ref 4.0–10.5)
nRBC: 0 % (ref 0.0–0.2)

## 2021-04-13 LAB — RESP PANEL BY RT-PCR (FLU A&B, COVID) ARPGX2
Influenza A by PCR: NEGATIVE
Influenza B by PCR: NEGATIVE
SARS Coronavirus 2 by RT PCR: NEGATIVE

## 2021-04-13 MED ORDER — LACTATED RINGERS IV BOLUS
500.0000 mL | Freq: Once | INTRAVENOUS | Status: AC
  Administered 2021-04-13: 500 mL via INTRAVENOUS

## 2021-04-13 MED ORDER — SODIUM CHLORIDE 0.9 % IV SOLN
500.0000 mg | Freq: Once | INTRAVENOUS | Status: AC
  Administered 2021-04-13: 500 mg via INTRAVENOUS
  Filled 2021-04-13: qty 500

## 2021-04-13 MED ORDER — PREDNISONE 10 MG PO TABS: 20.0000 mg | ORAL_TABLET | Freq: Two times a day (BID) | ORAL | 0 refills | Status: DC

## 2021-04-13 MED ORDER — LEVOFLOXACIN 500 MG PO TABS
500.0000 mg | ORAL_TABLET | Freq: Every day | ORAL | 0 refills | Status: DC
Start: 2021-04-13 — End: 2022-04-14

## 2021-04-13 MED ORDER — SODIUM CHLORIDE 0.9 % IV SOLN
1.0000 g | Freq: Once | INTRAVENOUS | Status: AC
  Administered 2021-04-13: 1 g via INTRAVENOUS
  Filled 2021-04-13: qty 10

## 2021-04-13 MED ORDER — ALBUTEROL SULFATE HFA 108 (90 BASE) MCG/ACT IN AERS
2.0000 | INHALATION_SPRAY | RESPIRATORY_TRACT | Status: DC | PRN
  Administered 2021-04-13: 2 via RESPIRATORY_TRACT
  Filled 2021-04-13: qty 6.7

## 2021-04-13 MED ORDER — METHYLPREDNISOLONE SODIUM SUCC 125 MG IJ SOLR: 125.0000 mg | Freq: Once | INTRAMUSCULAR | Status: DC

## 2021-04-13 NOTE — ED Triage Notes (Signed)
Pt presents 3 days with nasal congestion and sinus pressure, Scattered wheezing in bases. Has used his inhaler. Hx of COPD

## 2021-04-13 NOTE — ED Provider Notes (Signed)
[facility] EMERGENCY DEPT Provider Note   CSN: [id number] Arrival date & time: 04/13/21  0920     History Chief Complaint  Patient presents with   Nasal Congestion    Roger [name] is a 65 y.o. male.  HPI 65 yo male presents today complaining of cough and dyspnea.  Patient with history of smoking and copd, he quit smoking one year ago.  He began having symptoms yesterday with rhinnorhea with post nasal drip, then cough with yellow to gray sputum.  No fever, chills, nausea vomiting or diarrhea.  Some dyspnea which improves with inhaler.  He had covid last year but has not been vaccinated- no known covid exposures. Patient with dental problem last week and completed keflex- completed Sunday.      Past Medical History:  Diagnosis Date   Anxiety    Asthma    COPD (chronic obstructive pulmonary disease) ([place])    Depression    Hemorrhoids    History of rectal bleeding    Hypertension    Palpitations    Pneumonia    in 2015   Post-operative nausea and vomiting     Patient Active Problem List   Diagnosis Date Noted   Palpitations 10/17/2018   PVC (premature ventricular contraction) 10/17/2018   Essential hypertension 10/17/2018   COPD GOLD 0 still smoking  07/13/2015   Hemoptysis 06/11/2015   CAP (community acquired pneumonia) 06/11/2015   Lung nodules 06/11/2015   Cigarette smoker 03/19/2014   Generalized anxiety disorder 11/09/2011    Past Surgical History:  Procedure Laterality Date   DENTAL SURGERY     NOSE SURGERY     TONSILLECTOMY         Family History  Problem Relation Age of Onset   Anxiety disorder Mother    Depression Mother    Lung disease Mother        "arthritic lung"   Anxiety disorder Sister    Depression Sister    Anxiety disorder Brother    Lung disease Father    Lung cancer Father    Anxiety disorder Sister    Anxiety disorder Brother    Anxiety disorder Brother    Anxiety disorder Brother    Colon cancer Brother      Social History   Tobacco Use   Smoking status: Former    Packs/day: 0.50    Years: 25.00    Pack years: 12.50    Types: Cigarettes   Smokeless tobacco: Never  Vaping Use   Vaping Use: Never used  Substance Use Topics   Alcohol use: Never   Drug use: Never    Home Medications Prior to Admission medications   Medication Sig Start Date End Date Taking? Authorizing Provider  albuterol (PROVENTIL HFA;VENTOLIN HFA) 108 (90 Base) MCG/ACT inhaler Inhale 2 puffs into the lungs every 6 (six) hours as needed for wheezing or shortness of breath.    [provider]  ALPRAZolam Duanne Moron) 1 MG tablet Take 1 mg by mouth 5 (five) times daily.    [provider]  aspirin EC 81 MG tablet Take 162 mg by mouth daily.     [provider]  atenolol (TENORMIN) 50 MG tablet Take 50 mg by mouth daily.     [provider]  atorvastatin (LIPITOR) 10 MG tablet Take 10 mg by mouth daily at 6 PM.     [provider]  cephALEXin (KEFLEX) 500 MG capsule Take 1 capsule (500 mg total) by mouth 4 (four) times  daily. 03/30/21   Lajean Saver, MD  ibuprofen (ADVIL,MOTRIN) 200 MG tablet Take 400 mg by mouth every 6 (six) hours as needed for headache, mild pain or moderate pain.     [provider]  mirtazapine (REMERON) 15 MG tablet Take 15 mg by mouth at bedtime.    [provider]    Allergies    Demerol, Erythromycin, Prednisone, Sulfa antibiotics, Augmentin [amoxicillin-pot clavulanate], and Meperidine and related  Review of Systems   Review of Systems  HENT:  Positive for congestion, dental problem, postnasal drip and rhinorrhea.   Eyes: Negative.   Respiratory:  Positive for shortness of breath.   Cardiovascular: Negative.   Gastrointestinal: Negative.   Endocrine: Negative.   Genitourinary: Negative.   Musculoskeletal: Negative.   Skin: Negative.   Allergic/Immunologic: Negative.   Neurological: Negative.   Hematological: Negative.    Psychiatric/Behavioral: Negative.    All other systems reviewed and are negative.  Physical Exam Updated Vital Signs BP 132/75   Pulse 88   Temp 98.6 F (37 C) (Oral)   Resp 18   Ht 1.822 m (5' 11.75")   Wt 77.1 kg   SpO2 95%   BMI 23.22 kg/m   Physical Exam Vitals and nursing note reviewed.  Constitutional:      Appearance: He is normal weight.  HENT:     Head: Normocephalic.     Right Ear: External ear normal.     Left Ear: External ear normal.     Nose: Nose normal.     Mouth/Throat:     Pharynx: Oropharynx is clear.  Eyes:     Pupils: Pupils are equal, round, and reactive to light.  Cardiovascular:     Rate and Rhythm: Normal rate and regular rhythm.     Pulses: Normal pulses.  Pulmonary:     Effort: Pulmonary effort is normal.  Abdominal:     General: Abdomen is flat.     Palpations: Abdomen is soft.  Musculoskeletal:        General: Normal range of motion.     Cervical back: Normal range of motion.  Skin:    General: Skin is warm and dry.     Capillary Refill: Capillary refill takes less than 2 seconds.  Neurological:     General: No focal deficit present.     Mental Status: He is alert.  Psychiatric:        Mood and Affect: Mood normal.    ED Results / Procedures / Treatments   Labs (all labs ordered are listed, but only abnormal results are displayed) Labs Reviewed - No data to display  EKG None  Radiology DG Chest 2 View  Result Date: 04/13/2021 CLINICAL DATA:  Shortness of breath EXAM: CHEST - 2 VIEW COMPARISON:  07/31/2018 FINDINGS: Left mid lung consolidative opacity within the upper lobe. Possible additional patchy opacities elsewhere. Background emphysema. No pleural effusion or pneumothorax. Normal heart size. No acute osseous abnormality. IMPRESSION: Left upper lobe consolidative opacity suspicious for pneumonia. Possible additional patchy opacities elsewhere. Given emphysema, close follow-up is recommended to ensure resolution and  exclude mass. Electronically Signed   By: Macy Mis M.D.   On: 04/13/2021 10:47    Procedures Procedures   Medications Ordered in ED Medications  albuterol (VENTOLIN HFA) 108 (90 Base) MCG/ACT inhaler 2 puff (2 puffs Inhalation Given 04/13/21 0948)    ED Course  I have reviewed the triage vital signs and the nursing notes.  Pertinent labs & imaging results that  were available during my care of the patient were reviewed by me and considered in my medical decision making (see chart for details).    MDM Rules/Calculators/A&P                           65 year old male presents today with cough, congestion, and left upper lobe infiltrate on chest x-Arieona Swaggerty.  He appears hemodynamically stable with oxygen saturations at 95%.  He has a leukocytosis of 12,000.  COVID tested and negative.  He is treated here in the ED with Rocephin and Zithromax.  His lungs are clear here in he has been using his albuterol inhaler here in the ED. Patient is concerning given his history of COPD and probable infiltrate on chest x-Roger [name].  However, patient appears very stable here in the ED and wishes to be discharged.  He is given dose of Rocephin and Zithromax here in the ED and plan outpatient treatment with Levaquin.  We have discussed in depth his need to return immediately if he becomes short of breath or worse at any time.  Additionally we discussed that he needs to have a recheck with his doctor tomorrow. Hyperglycemia noted on labs and patient advised that he will need to have this rechecked tomorrow. Patient states he cannot take prednisone Solu-Medrol and prednisone canceled Final Clinical Impression(s) / ED Diagnoses Final diagnoses:  Pneumonia of left upper lobe due to infectious organism    Rx / DC Orders ED Discharge Orders     None        Pattricia Boss, MD 04/13/21 1332

## 2021-04-13 NOTE — Discharge Instructions (Addendum)
You have appear to have pneumonia on your x-Roger [name].  Additionally your COPD puts you at risk of having worsening symptoms.  Please take all your antibiotics as prescribed.  Use your albuterol every 2-4 hours.  Take prednisone as prescribed. Recheck with your doctor tomorrow for recheck of your lungs and blood sugar check. Return to the emergency department if you are worse at any time especially shortness of breath, high fever, or inability to tolerate your medicines.

## 2021-04-13 NOTE — ED Notes (Signed)
Pt discharged home after verbalizing understanding of discharge instructions; nad noted. 

## 2021-04-21 ENCOUNTER — Other Ambulatory Visit (HOSPITAL_BASED_OUTPATIENT_CLINIC_OR_DEPARTMENT_OTHER): Payer: Self-pay | Admitting: Physician Assistant

## 2021-04-21 DIAGNOSIS — J189 Pneumonia, unspecified organism: Secondary | ICD-10-CM

## 2021-11-30 ENCOUNTER — Encounter (HOSPITAL_BASED_OUTPATIENT_CLINIC_OR_DEPARTMENT_OTHER): Payer: Self-pay | Admitting: Obstetrics and Gynecology

## 2021-11-30 ENCOUNTER — Other Ambulatory Visit: Payer: Self-pay

## 2021-11-30 ENCOUNTER — Emergency Department (HOSPITAL_BASED_OUTPATIENT_CLINIC_OR_DEPARTMENT_OTHER)
Admission: EM | Admit: 2021-11-30 | Discharge: 2021-11-30 | Disposition: A | Discharge status: Home / Self Care | Payer: BC Managed Care – PPO | Attending: Emergency Medicine | Admitting: Emergency Medicine

## 2021-11-30 ENCOUNTER — Emergency Department (HOSPITAL_BASED_OUTPATIENT_CLINIC_OR_DEPARTMENT_OTHER): Payer: BC Managed Care – PPO | Admitting: Radiology

## 2021-11-30 DIAGNOSIS — X509XXA Other and unspecified overexertion or strenuous movements or postures, initial encounter: Secondary | ICD-10-CM | POA: Insufficient documentation

## 2021-11-30 DIAGNOSIS — Z79899 Other long term (current) drug therapy: Secondary | ICD-10-CM | POA: Diagnosis not present

## 2021-11-30 DIAGNOSIS — S93601A Unspecified sprain of right foot, initial encounter: Secondary | ICD-10-CM | POA: Diagnosis not present

## 2021-11-30 DIAGNOSIS — J449 Chronic obstructive pulmonary disease, unspecified: Secondary | ICD-10-CM | POA: Insufficient documentation

## 2021-11-30 DIAGNOSIS — Z7982 Long term (current) use of aspirin: Secondary | ICD-10-CM | POA: Insufficient documentation

## 2021-11-30 DIAGNOSIS — I10 Essential (primary) hypertension: Secondary | ICD-10-CM | POA: Insufficient documentation

## 2021-11-30 DIAGNOSIS — S99921A Unspecified injury of right foot, initial encounter: Secondary | ICD-10-CM | POA: Diagnosis present

## 2021-11-30 NOTE — ED Triage Notes (Signed)
Patient reports to the ER for right foot pain. Patient reports he has had significant pain that is increasing in his foot. Patient states he was sent by PCP for an x-ray

## 2021-11-30 NOTE — ED Provider Notes (Signed)
[facility] EMERGENCY DEPT Provider Note   CSN: [id number] Arrival date & time: 11/30/21  1232     History  Chief Complaint  Patient presents with   Foot Pain    Roger [name] is a 66 y.o. male.  Pt is a 66 yo male with a pmhx significant for anxiety, depression, htn, copd, and palpitations.  Pt said he stepped off a curb awkwardly with his right foot a few days ago.  Pt has had swelling and pain in his medial foot since then.  He tried to go to work today and was unable to walk on it.  Pt has been taking ibuprofen for pain.      Home Medications Prior to Admission medications   Medication Sig Start Date End Date Taking? Authorizing Provider  albuterol (PROVENTIL HFA;VENTOLIN HFA) 108 (90 Base) MCG/ACT inhaler Inhale 2 puffs into the lungs every 6 (six) hours as needed for wheezing or shortness of breath.    [provider]  ALPRAZolam Duanne Moron) 1 MG tablet Take 1 mg by mouth 5 (five) times daily.    [provider]  aspirin EC 81 MG tablet Take 162 mg by mouth daily.     [provider]  atenolol (TENORMIN) 50 MG tablet Take 50 mg by mouth daily.     [provider]  atorvastatin (LIPITOR) 10 MG tablet Take 10 mg by mouth daily at 6 PM.     [provider]  cephALEXin (KEFLEX) 500 MG capsule Take 1 capsule (500 mg total) by mouth 4 (four) times daily. 03/30/21   Lajean Saver, MD  ibuprofen (ADVIL,MOTRIN) 200 MG tablet Take 400 mg by mouth every 6 (six) hours as needed for headache, mild pain or moderate pain.     [provider]  levofloxacin (LEVAQUIN) 500 MG tablet Take 1 tablet (500 mg total) by mouth daily. 04/13/21   Pattricia Boss, MD  mirtazapine (REMERON) 15 MG tablet Take 15 mg by mouth at bedtime.    [provider]      Allergies    Demerol, Erythromycin, Prednisone, Sulfa antibiotics, Augmentin [amoxicillin-pot clavulanate], and Meperidine and related    Review of Systems   Review of Systems   Musculoskeletal:        Right foot pain  All other systems reviewed and are negative.  Physical Exam Updated Vital Signs BP 138/86   Pulse 70   Temp 98.4 F (36.9 C)   Resp 15   SpO2 97%  Physical Exam Vitals and nursing note reviewed.  Constitutional:      Appearance: Normal appearance.  HENT:     Head: Normocephalic and atraumatic.     Right Ear: External ear normal.     Left Ear: External ear normal.     Nose: Nose normal.     Mouth/Throat:     Mouth: Mucous membranes are moist.     Pharynx: Oropharynx is clear.  Eyes:     Extraocular Movements: Extraocular movements intact.     Conjunctiva/sclera: Conjunctivae normal.     Pupils: Pupils are equal, round, and reactive to light.  Cardiovascular:     Rate and Rhythm: Normal rate and regular rhythm.     Pulses: Normal pulses.     Heart sounds: Normal heart sounds.  Pulmonary:     Effort: Pulmonary effort is normal.     Breath sounds: Normal breath sounds.  Abdominal:     General: Abdomen is flat. Bowel sounds are normal.  Palpations: Abdomen is soft.  Musculoskeletal:     Cervical back: Normal range of motion and neck supple.       Feet:  Skin:    General: Skin is warm.     Capillary Refill: Capillary refill takes less than 2 seconds.  Neurological:     General: No focal deficit present.     Mental Status: He is alert and oriented to person, place, and time.  Psychiatric:        Mood and Affect: Mood normal.        Behavior: Behavior normal.    ED Results / Procedures / Treatments   Labs (all labs ordered are listed, but only abnormal results are displayed) Labs Reviewed - No data to display  EKG None  Radiology DG Foot Complete Right  Result Date: 11/30/2021 CLINICAL DATA:  Foot injury in a 65 year old male. EXAM: RIGHT FOOT COMPLETE - 3+ VIEW COMPARISON:  March 27, 2013. FINDINGS: Mild to moderate hallux valgus with moderate first metatarsophalangeal osteoarthritic changes. No sign of acute  fracture or dislocation. No radiopaque foreign body. No substantial soft tissue swelling. IMPRESSION: No acute findings. Degenerative changes with hallux valgus. Electronically Signed   By: Zetta Bills M.D.   On: 11/30/2021 13:15    Procedures Procedures    Medications Ordered in ED Medications - No data to display  ED Course/ Medical Decision Making/ A&P                           Medical Decision Making Amount and/or Complexity of Data Reviewed Radiology: ordered.   This patient presents to the ED for concern of right foot pain, this involves an extensive number of treatment options, and is a complaint that carries with it a high risk of complications and morbidity.  The differential diagnosis includes fx, sprain   Co morbidities that complicate the patient evaluation  anxiety, depression, htn, copd, and palpitations   Additional history obtained:  Additional history obtained from epic chart review  Imaging Studies ordered:  I ordered imaging studies including foot x-ray  I independently visualized and interpreted imaging which showed    IMPRESSION:  No acute findings. Degenerative changes with hallux valgus.   I agree with the radiologist interpretation   Cardiac Monitoring:  The patient was maintained on a cardiac monitor.  I personally viewed and interpreted the cardiac monitored which showed an underlying rhythm of: nsr   Medicines ordered and prescription drug management:   I have reviewed the patients home medicines and have made adjustments as needed    Problem List / ED Course:  Right foot pain:  x-ray neg.  Pt did not want anything for pain here or at home.  He is placed in a cam walker.  He is to f/u with ortho.  He is given a note for work.  Pt is to return if worse.    Reevaluation:  After the interventions noted above, I reevaluated the patient and found that they have :improved   Social Determinants of Health:  Lives at  home   Dispostion:  After consideration of the diagnostic results and the patients response to treatment, I feel that the patent would benefit from d/c with outpatient f/u.          Final Clinical Impression(s) / ED Diagnoses Final diagnoses:  Sprain of right foot, initial encounter    Rx / DC Orders ED Discharge Orders     None  Isla Pence, MD 11/30/21 1427

## 2022-02-12 ENCOUNTER — Other Ambulatory Visit: Payer: Self-pay

## 2022-02-12 ENCOUNTER — Emergency Department (HOSPITAL_BASED_OUTPATIENT_CLINIC_OR_DEPARTMENT_OTHER)
Admission: EM | Admit: 2022-02-12 | Discharge: 2022-02-12 | Disposition: A | Discharge status: Home / Self Care | Payer: BC Managed Care – PPO | Attending: Emergency Medicine | Admitting: Emergency Medicine

## 2022-02-12 ENCOUNTER — Encounter (HOSPITAL_BASED_OUTPATIENT_CLINIC_OR_DEPARTMENT_OTHER): Payer: Self-pay | Admitting: Emergency Medicine

## 2022-02-12 DIAGNOSIS — I10 Essential (primary) hypertension: Secondary | ICD-10-CM | POA: Diagnosis not present

## 2022-02-12 DIAGNOSIS — J449 Chronic obstructive pulmonary disease, unspecified: Secondary | ICD-10-CM | POA: Diagnosis not present

## 2022-02-12 DIAGNOSIS — Z7982 Long term (current) use of aspirin: Secondary | ICD-10-CM | POA: Insufficient documentation

## 2022-02-12 DIAGNOSIS — Z7951 Long term (current) use of inhaled steroids: Secondary | ICD-10-CM | POA: Insufficient documentation

## 2022-02-12 DIAGNOSIS — J45909 Unspecified asthma, uncomplicated: Secondary | ICD-10-CM | POA: Diagnosis not present

## 2022-02-12 DIAGNOSIS — R1900 Intra-abdominal and pelvic swelling, mass and lump, unspecified site: Secondary | ICD-10-CM | POA: Diagnosis present

## 2022-02-12 DIAGNOSIS — Z79899 Other long term (current) drug therapy: Secondary | ICD-10-CM | POA: Diagnosis not present

## 2022-02-12 DIAGNOSIS — D171 Benign lipomatous neoplasm of skin and subcutaneous tissue of trunk: Secondary | ICD-10-CM | POA: Diagnosis not present

## 2022-02-12 NOTE — ED Provider Notes (Signed)
[facility] DEPT Provider Note   CSN: [id number] Arrival date & time: 02/12/22  1431     History  Chief Complaint  Patient presents with   Cyst    Roger [name] is a 66 y.o. male with history of anxiety, hypertension, depression, COPD, asthma who presents the emergency department complaining of a cyst on his abdomen.  He states that he has had this for about a month and a half, but seems like its been slightly bigger in size over the past few weeks.  He previously saw his primary doctor and told it was a cyst.  He states that he has been trying to eat well, and is lost about 20 pounds recently.  Denies any fevers or chills.  Has concerns that it could be a blood clot.  HPI     Home Medications Prior to Admission medications   Medication Sig Start Date End Date Taking? Authorizing Provider  albuterol (PROVENTIL HFA;VENTOLIN HFA) 108 (90 Base) MCG/ACT inhaler Inhale 2 puffs into the lungs every 6 (six) hours as needed for wheezing or shortness of breath.    [provider]  ALPRAZolam Duanne Moron) 1 MG tablet Take 1 mg by mouth 5 (five) times daily.    [provider]  aspirin EC 81 MG tablet Take 162 mg by mouth daily.     [provider]  atenolol (TENORMIN) 50 MG tablet Take 50 mg by mouth daily.     [provider]  atorvastatin (LIPITOR) 10 MG tablet Take 10 mg by mouth daily at 6 PM.     [provider]  cephALEXin (KEFLEX) 500 MG capsule Take 1 capsule (500 mg total) by mouth 4 (four) times daily. 03/30/21   Lajean Saver, MD  ibuprofen (ADVIL,MOTRIN) 200 MG tablet Take 400 mg by mouth every 6 (six) hours as needed for headache, mild pain or moderate pain.     [provider]  levofloxacin (LEVAQUIN) 500 MG tablet Take 1 tablet (500 mg total) by mouth daily. 04/13/21   Pattricia Boss, MD  mirtazapine (REMERON) 15 MG tablet Take 15 mg by mouth at bedtime.    [provider]      Allergies    Demerol,  Erythromycin, Prednisone, Sulfa antibiotics, Augmentin [amoxicillin-pot clavulanate], and Meperidine and related    Review of Systems   Review of Systems  Skin:        Cyst on abdomen  All other systems reviewed and are negative.   Physical Exam Updated Vital Signs BP 124/75   Pulse (!) 55   Temp 97.7 F (36.5 C) (Oral)   Resp 16   Ht 5' 11.75" (1.822 m)   Wt 77.1 kg   SpO2 97%   BMI 23.22 kg/m  Physical Exam Vitals and nursing note reviewed.  Constitutional:      Appearance: Normal appearance.  HENT:     Head: Normocephalic and atraumatic.  Eyes:     Conjunctiva/sclera: Conjunctivae normal.  Pulmonary:     Effort: Pulmonary effort is normal. No respiratory distress.  Abdominal:     General: Abdomen is flat.     Palpations: Abdomen is soft.       Comments: Round smooth structure palpated just under the skin on the left upper abdomen, without erythema, fluctuance or induration.  Nontender.  Very mobile.  Skin:    General: Skin is warm and dry.  Neurological:     Mental Status: He is alert.  Psychiatric:  Mood and Affect: Mood normal.        Behavior: Behavior normal.     ED Results / Procedures / Treatments   Labs (all labs ordered are listed, but only abnormal results are displayed) Labs Reviewed - No data to display  EKG None  Radiology No results found.  Procedures Procedures    Medications Ordered in ED Medications - No data to display  ED Course/ Medical Decision Making/ A&P                           Medical Decision Making Patient is a 66 year old male with history of anxiety, hypertension, depression, COPD, asthma who presents the emergency department complaining of a cyst on his abdomen.  On exam patient has normal vital signs.  He has a nontender round smooth mobile structure palpated just under the skin on his left upper abdomen without overlying skin changes.  Discussed with the patient that these findings are most consistent  with a lipoma. Does not appear to be an abscess as it is without fluctuance or tenderness. Not in the pattern of a lymph node. Suspect it appeared larger in size after patient's intentional recent weight loss.   Will discharge to home and recommend follow up with PCP and possibly dermatologist if he would like it removed. Discharged in stable condition and all questions answered.         Final Clinical Impression(s) / ED Diagnoses Final diagnoses:  Lipoma of torso    Rx / DC Orders ED Discharge Orders     None      Portions of this report may have been transcribed using voice recognition software. Every effort was made to ensure accuracy; however, inadvertent computerized transcription errors may be present.    Estill Cotta 02/12/22 1618    Pattricia Boss, MD 02/15/22 1254

## 2022-02-12 NOTE — ED Triage Notes (Signed)
Pt arrives to ED with c/o cyst to his left upper abdomen x3 weeks.

## 2022-02-12 NOTE — Discharge Instructions (Addendum)
You were seen in the emergency department today for cyst on your abdomen.  As we discussed this is consistent with a lipoma, which is clump of some fatty tissue.  Unless this is significantly bothering you, there is nothing that needs to be done about this emergently.  I recommend following up with both your primary doctor and possibly dermatologist if he like to have it removed.

## 2022-02-12 NOTE — ED Notes (Signed)
Patient verbalizes understanding of discharge instructions. Opportunity for questioning and answers were provided. Patient discharged from ED.  °

## 2022-03-14 ENCOUNTER — Other Ambulatory Visit (HOSPITAL_COMMUNITY): Payer: Self-pay | Admitting: Family Medicine

## 2022-03-15 ENCOUNTER — Other Ambulatory Visit: Payer: Self-pay | Admitting: Family Medicine

## 2022-03-15 ENCOUNTER — Other Ambulatory Visit (HOSPITAL_COMMUNITY): Payer: Self-pay | Admitting: Family Medicine

## 2022-03-15 DIAGNOSIS — R918 Other nonspecific abnormal finding of lung field: Secondary | ICD-10-CM

## 2022-03-15 DIAGNOSIS — C3490 Malignant neoplasm of unspecified part of unspecified bronchus or lung: Secondary | ICD-10-CM

## 2022-03-24 ENCOUNTER — Ambulatory Visit (HOSPITAL_COMMUNITY)
Admission: RE | Admit: 2022-03-24 | Discharge: 2022-03-24 | Disposition: A | Discharge status: Home / Self Care | Payer: BC Managed Care – PPO | Source: Ambulatory Visit | Attending: Family Medicine | Admitting: Family Medicine

## 2022-03-24 DIAGNOSIS — R918 Other nonspecific abnormal finding of lung field: Secondary | ICD-10-CM | POA: Diagnosis present

## 2022-03-24 DIAGNOSIS — C3490 Malignant neoplasm of unspecified part of unspecified bronchus or lung: Secondary | ICD-10-CM | POA: Diagnosis present

## 2022-03-24 LAB — GLUCOSE, CAPILLARY: Glucose-Capillary: 132 mg/dL — ABNORMAL HIGH (ref 70–99)

## 2022-03-24 MED ORDER — FLUDEOXYGLUCOSE F - 18 (FDG) INJECTION
8.5000 | Freq: Once | INTRAVENOUS | Status: AC | PRN
  Administered 2022-03-24: 8.4 via INTRAVENOUS

## 2022-04-12 ENCOUNTER — Other Ambulatory Visit: Payer: Self-pay | Admitting: *Deleted

## 2022-04-12 ENCOUNTER — Encounter: Payer: Self-pay | Admitting: *Deleted

## 2022-04-12 ENCOUNTER — Institutional Professional Consult (permissible substitution): Payer: BC Managed Care – PPO | Admitting: Thoracic Surgery (Cardiothoracic Vascular Surgery)

## 2022-04-12 ENCOUNTER — Encounter: Payer: Self-pay | Admitting: Thoracic Surgery (Cardiothoracic Vascular Surgery)

## 2022-04-12 VITALS — BP 129/77 | HR 74 | Resp 20 | Ht 71.0 in | Wt 160.0 lb

## 2022-04-12 DIAGNOSIS — R918 Other nonspecific abnormal finding of lung field: Secondary | ICD-10-CM

## 2022-04-12 DIAGNOSIS — C3490 Malignant neoplasm of unspecified part of unspecified bronchus or lung: Secondary | ICD-10-CM

## 2022-04-12 DIAGNOSIS — Z01818 Encounter for other preprocedural examination: Secondary | ICD-10-CM

## 2022-04-12 DIAGNOSIS — R222 Localized swelling, mass and lump, trunk: Secondary | ICD-10-CM

## 2022-04-12 NOTE — Progress Notes (Signed)
PCP is Aletha Halim., PA-C Referring Provider is Aletha Halim., PA-C  Chief Complaint  Patient presents with   Lung Lesion    CT C/A/P 9/5, PET 9/21     HPI: [name] sent for consultation regarding lung masses.  Roger [name] is a 66 year old man with a history of tobacco abuse, COPD, hypertension, and anxiety.  He has a 20-pack-year history of smoking (1/2 pack/day x 40 years).  He recently noted lump on along the right costal margin.  He first noticed that about a month and a half ago.  He had it checked out initially was thought it might be a lipoma but it continued to grow rapidly.  He had a PET/CT done which showed that mass was markedly hypermetabolic.  There were bilateral lung masses, hypermetabolic hilar mediastinal lymph nodes, and rib metastases as well.  He is not having any shortness of breath or chest pain.  He has had a poor appetite and has lost about 7 pounds over the past 6 months.  He is very anxious.  Zubrod Score: At the time of surgery this patient's most appropriate activity status/level should be described as: [x]     0    Normal activity, no symptoms []     1    Restricted in physical strenuous activity but ambulatory, able to do out light work []     2    Ambulatory and capable of self care, unable to do work activities, up and about >50 % of waking hours                              []     3    Only limited self care, in bed greater than 50% of waking hours []     4    Completely disabled, no self care, confined to bed or chair []     5    Moribund  Past Medical History:  Diagnosis Date   Anxiety    Asthma    COPD (chronic obstructive pulmonary disease) ([place])    Depression    Hemorrhoids    History of rectal bleeding    Hypertension    Palpitations    Pneumonia    in 2015   Post-operative nausea and vomiting     Past Surgical History:  Procedure Laterality Date   DENTAL SURGERY     NOSE SURGERY     TONSILLECTOMY      Family History  Problem  Relation Age of Onset   Anxiety disorder Mother    Depression Mother    Lung disease Mother        "arthritic lung"   Anxiety disorder Sister    Depression Sister    Anxiety disorder Brother    Lung disease Father    Lung cancer Father    Anxiety disorder Sister    Anxiety disorder Brother    Anxiety disorder Brother    Anxiety disorder Brother    Colon cancer Brother     Social History Social History   Tobacco Use   Smoking status: Former    Packs/day: 0.50    Years: 25.00    Total pack years: 12.50    Types: Cigarettes   Smokeless tobacco: Never  Vaping Use   Vaping Use: Never used  Substance Use Topics   Alcohol use: Never   Drug use: Never    Current Outpatient Medications  Medication Sig Dispense Refill  albuterol (PROVENTIL HFA;VENTOLIN HFA) 108 (90 Base) MCG/ACT inhaler Inhale 2 puffs into the lungs every 6 (six) hours as needed for wheezing or shortness of breath.     ALPRAZolam (XANAX) 1 MG tablet Take 1 mg by mouth 5 (five) times daily.     aspirin EC 81 MG tablet Take 162 mg by mouth daily.      atenolol (TENORMIN) 50 MG tablet Take 50 mg by mouth daily.      atorvastatin (LIPITOR) 10 MG tablet Take 10 mg by mouth daily at 6 PM.      doxycycline (VIBRAMYCIN) 100 MG capsule Take by mouth.     ibuprofen (ADVIL,MOTRIN) 200 MG tablet Take 400 mg by mouth every 6 (six) hours as needed for headache, mild pain or moderate pain.      levofloxacin (LEVAQUIN) 500 MG tablet Take 1 tablet (500 mg total) by mouth daily. 7 tablet 0   mirtazapine (REMERON) 15 MG tablet Take 15 mg by mouth at bedtime.     No current facility-administered medications for this visit.    Allergies  Allergen Reactions   Demerol Palpitations and Other (See Comments)    Heart beats fast   Erythromycin Nausea And Vomiting   Prednisone Other (See Comments)    "felt weird"   Sulfa Antibiotics Nausea And Vomiting and Other (See Comments)    hematoemesis   Augmentin [Amoxicillin-Pot  Clavulanate]    Meperidine And Related     Review of Systems  Constitutional:  Positive for appetite change and unexpected weight change. Negative for fatigue.  HENT:  Negative for trouble swallowing and voice change.   Respiratory:  Positive for shortness of breath (With heavy activity). Negative for cough and wheezing.   Cardiovascular:  Negative for chest pain and leg swelling.  Gastrointestinal:  Negative for abdominal pain.  Genitourinary:  Negative for difficulty urinating.  Musculoskeletal:  Negative for arthralgias and joint swelling.       Mass right costal margin  Neurological:  Negative for seizures and weakness.  Psychiatric/Behavioral:  The patient is nervous/anxious.     BP 129/77 (BP Location: Right Arm, Patient Position: Sitting)   Pulse 74   Resp 20   Ht 5\' 11"  (1.803 m)   Wt 160 lb (72.6 kg)   SpO2 95% Comment: RA  BMI 22.32 kg/m  Physical Exam Vitals reviewed.  Constitutional:      General: He is not in acute distress. Eyes:     General: No scleral icterus.    Extraocular Movements: Extraocular movements intact.  Cardiovascular:     Rate and Rhythm: Normal rate and regular rhythm.     Heart sounds: Normal heart sounds. No murmur heard. Pulmonary:     Effort: Pulmonary effort is normal. No respiratory distress.     Breath sounds: No wheezing.     Comments: Diminished breath sounds bilaterally Abdominal:     Palpations: Abdomen is soft. There is mass (Leftt costal margin approximately 4 cm mass with skin discoloration and minimal skin breakdown centrally).  Lymphadenopathy:     Cervical: No cervical adenopathy.  Neurological:     General: No focal deficit present.     Mental Status: He is alert and oriented to person, place, and time.     Cranial Nerves: No cranial nerve deficit.     Motor: No weakness.  Psychiatric:     Comments: Extremely anxious     Diagnostic Tests: NUCLEAR MEDICINE PET SKULL BASE TO THIGH   TECHNIQUE: 8.4 mCi F-18  FDG was  injected intravenously. Full-ring PET imaging was performed from the skull base to thigh after the radiotracer. CT data was obtained and used for attenuation correction and anatomic localization.   Fasting blood glucose: 132 mg/dl   COMPARISON:  CT chest abdomen pelvis 03/08/2022.   FINDINGS: Mediastinal blood pool activity: SUV max 3.6   Liver activity: SUV max NA   NECK:   No hypermetabolic lymph nodes.   Incidental CT findings:   None.   CHEST:   Hypermetabolic subcarinal and bihilar lymph nodes. Index subcarinal lymph node measures 1.5 cm (4/80), SUV max 4.5. Hypermetabolic pulmonary nodules and masses bilaterally. Largest measures 5.7 x 6.8 cm and is necrotic in appearance with peripheral hypermetabolism, SUV max 14.0.   Incidental CT findings:   Atherosclerotic calcification of the aorta, aortic valve and coronary arteries. Enlarged pulmonic trunk. Heart is at the upper limits normal in size to mildly enlarged. Small loculated left pleural fluid. Severe centrilobular and paraseptal emphysema.   ABDOMEN/PELVIS:   No abnormal hypermetabolism in the liver, adrenal glands, spleen or pancreas. No hypermetabolic lymph nodes.   Incidental CT findings:   Liver, gallbladder, adrenal glands, kidneys, spleen, pancreas, stomach and bowel are grossly unremarkable. Atherosclerotic calcification of the aorta.   SKELETON:   Scattered hypermetabolic osseous lesions involving the spine, right anterior sixth rib and right iliac wing. Index lucent lesion in the medial right iliac wing measures 2.0 cm (4/175), SUV max 4.9. Hypermetabolic soft tissue mass in the subcutaneous fat along the ventral left upper quadrant measures 2.8 x 2.9 cm, SUV max 15.5.   Incidental CT findings:   Degenerative changes in the spine.   IMPRESSION: 1. 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 2. Small loculated left pleural effusion. 3. Aortic atherosclerosis (ICD10-I70.0). Coronary artery calcification. 4. Enlarged pulmonic trunk, indicative pulmonary arterial hypertension. 5.  Emphysema (ICD10-J43.9).     Electronically Signed   By: Lorin Picket M.D.   On: 03/25/2022 16:37 I personally reviewed the PET/CT images.  Findings are consistent with stage IV cancer.  Large left lower lobe mass with central necrosis.  Bilateral lung masses.  Hilar and mediastinal adenopathy.  Bone metastasis and soft tissue mass left upper quadrant  Impression: Carlisle Enke is a 66 year old man with a history of tobacco abuse, COPD, hypertension, and anxiety.  He noted a new abdominal wall mass about a month and a half ago.  That has been checked out several times.  Finally a PET/CT was done which showed that mass is hypermetabolic.  Unfortunately there also is extensive disease elsewhere.  He has multiple lung masses, hypermetabolic mediastinal and hilar lymph nodes, and bony metastases.  Findings most consistent with stage IV lung cancer although the abdominal wall mass is a bit of a wildcard.  Certainly that is possible but not a typical finding.  I strongly emphasized the importance of getting a tissue diagnosis so that he could be started on appropriate treatment.  I think the best option would be to biopsy the lung masses with bronchoscopy and but also to do a needle biopsy of the left costal margin soft tissue mass while he is in the OR.  He had a lot of questions and was looking for reassurance that this could be cured.  I spent a long time with him discussing the  fact that this can certainly be treated but without a tissue diagnosis is unknown if it can be cured.  Most likely it is not curable but treatment could prolong and improve quality of life.   Certainly if untreated his quality of life will deteriorate rapidly.  I recommended navigational bronchoscopy and needle biopsy of the left chest wall mass to Roger [name].  I informed him of the general nature of the procedures including the need for general anesthesia, the endoscopic nature of the bronchoscopy, that this was diagnostic and not therapeutic, and the expected outpatient nature of the procedure.  I informed him of the indications, risks, benefits, and alternatives.  He understands the risks include, but not limited to death, MI, DVT, PE, bleeding, infection, pneumothorax, as well as possibility of other unforeseeable complications.  He accepts the risks and agrees to proceed  He needs an MRI of the brain to complete his metastatic work-up.  Plan: MR brain Super D CT for navigational bronchoscopy planning Navigational bronchoscopy and needle biopsy of left abdominal/chest wall mass on Wednesday, 04/20/2022  [name], MD [facility] [phone]

## 2022-04-12 NOTE — H&P (View-Only) (Signed)
PCP is Aletha Halim., PA-C Referring Provider is Aletha Halim., PA-C  Chief Complaint  Patient presents with   Lung Lesion    CT C/A/P 9/5, PET 9/21     HPI: [name] sent for consultation regarding lung masses.  Talan Gildner is a 66 year old man with a history of tobacco abuse, COPD, hypertension, and anxiety.  He has a 20-pack-year history of smoking (1/2 pack/day x 40 years).  He recently noted lump on along the right costal margin.  He first noticed that about a month and a half ago.  He had it checked out initially was thought it might be a lipoma but it continued to grow rapidly.  He had a PET/CT done which showed that mass was markedly hypermetabolic.  There were bilateral lung masses, hypermetabolic hilar mediastinal lymph nodes, and rib metastases as well.  He is not having any shortness of breath or chest pain.  He has had a poor appetite and has lost about 7 pounds over the past 6 months.  He is very anxious.  Zubrod Score: At the time of surgery this patient's most appropriate activity status/level should be described as: [x]     0    Normal activity, no symptoms []     1    Restricted in physical strenuous activity but ambulatory, able to do out light work []     2    Ambulatory and capable of self care, unable to do work activities, up and about >50 % of waking hours                              []     3    Only limited self care, in bed greater than 50% of waking hours []     4    Completely disabled, no self care, confined to bed or chair []     5    Moribund  Past Medical History:  Diagnosis Date   Anxiety    Asthma    COPD (chronic obstructive pulmonary disease) ([place])    Depression    Hemorrhoids    History of rectal bleeding    Hypertension    Palpitations    Pneumonia    in 2015   Post-operative nausea and vomiting     Past Surgical History:  Procedure Laterality Date   DENTAL SURGERY     NOSE SURGERY     TONSILLECTOMY      Family History  Problem  Relation Age of Onset   Anxiety disorder Mother    Depression Mother    Lung disease Mother        "arthritic lung"   Anxiety disorder Sister    Depression Sister    Anxiety disorder Brother    Lung disease Father    Lung cancer Father    Anxiety disorder Sister    Anxiety disorder Brother    Anxiety disorder Brother    Anxiety disorder Brother    Colon cancer Brother     Social History Social History   Tobacco Use   Smoking status: Former    Packs/day: 0.50    Years: 25.00    Total pack years: 12.50    Types: Cigarettes   Smokeless tobacco: Never  Vaping Use   Vaping Use: Never used  Substance Use Topics   Alcohol use: Never   Drug use: Never    Current Outpatient Medications  Medication Sig Dispense Refill  albuterol (PROVENTIL HFA;VENTOLIN HFA) 108 (90 Base) MCG/ACT inhaler Inhale 2 puffs into the lungs every 6 (six) hours as needed for wheezing or shortness of breath.     ALPRAZolam (XANAX) 1 MG tablet Take 1 mg by mouth 5 (five) times daily.     aspirin EC 81 MG tablet Take 162 mg by mouth daily.      atenolol (TENORMIN) 50 MG tablet Take 50 mg by mouth daily.      atorvastatin (LIPITOR) 10 MG tablet Take 10 mg by mouth daily at 6 PM.      doxycycline (VIBRAMYCIN) 100 MG capsule Take by mouth.     ibuprofen (ADVIL,MOTRIN) 200 MG tablet Take 400 mg by mouth every 6 (six) hours as needed for headache, mild pain or moderate pain.      levofloxacin (LEVAQUIN) 500 MG tablet Take 1 tablet (500 mg total) by mouth daily. 7 tablet 0   mirtazapine (REMERON) 15 MG tablet Take 15 mg by mouth at bedtime.     No current facility-administered medications for this visit.    Allergies  Allergen Reactions   Demerol Palpitations and Other (See Comments)    Heart beats fast   Erythromycin Nausea And Vomiting   Prednisone Other (See Comments)    "felt weird"   Sulfa Antibiotics Nausea And Vomiting and Other (See Comments)    hematoemesis   Augmentin [Amoxicillin-Pot  Clavulanate]    Meperidine And Related     Review of Systems  Constitutional:  Positive for appetite change and unexpected weight change. Negative for fatigue.  HENT:  Negative for trouble swallowing and voice change.   Respiratory:  Positive for shortness of breath (With heavy activity). Negative for cough and wheezing.   Cardiovascular:  Negative for chest pain and leg swelling.  Gastrointestinal:  Negative for abdominal pain.  Genitourinary:  Negative for difficulty urinating.  Musculoskeletal:  Negative for arthralgias and joint swelling.       Mass right costal margin  Neurological:  Negative for seizures and weakness.  Psychiatric/Behavioral:  The patient is nervous/anxious.     BP 129/77 (BP Location: Right Arm, Patient Position: Sitting)   Pulse 74   Resp 20   Ht 5\' 11"  (1.803 m)   Wt 160 lb (72.6 kg)   SpO2 95% Comment: RA  BMI 22.32 kg/m  Physical Exam Vitals reviewed.  Constitutional:      General: He is not in acute distress. Eyes:     General: No scleral icterus.    Extraocular Movements: Extraocular movements intact.  Cardiovascular:     Rate and Rhythm: Normal rate and regular rhythm.     Heart sounds: Normal heart sounds. No murmur heard. Pulmonary:     Effort: Pulmonary effort is normal. No respiratory distress.     Breath sounds: No wheezing.     Comments: Diminished breath sounds bilaterally Abdominal:     Palpations: Abdomen is soft. There is mass (Leftt costal margin approximately 4 cm mass with skin discoloration and minimal skin breakdown centrally).  Lymphadenopathy:     Cervical: No cervical adenopathy.  Neurological:     General: No focal deficit present.     Mental Status: He is alert and oriented to person, place, and time.     Cranial Nerves: No cranial nerve deficit.     Motor: No weakness.  Psychiatric:     Comments: Extremely anxious     Diagnostic Tests: NUCLEAR MEDICINE PET SKULL BASE TO THIGH   TECHNIQUE: 8.4 mCi F-18  FDG was  injected intravenously. Full-ring PET imaging was performed from the skull base to thigh after the radiotracer. CT data was obtained and used for attenuation correction and anatomic localization.   Fasting blood glucose: 132 mg/dl   COMPARISON:  CT chest abdomen pelvis 03/08/2022.   FINDINGS: Mediastinal blood pool activity: SUV max 3.6   Liver activity: SUV max NA   NECK:   No hypermetabolic lymph nodes.   Incidental CT findings:   None.   CHEST:   Hypermetabolic subcarinal and bihilar lymph nodes. Index subcarinal lymph node measures 1.5 cm (4/80), SUV max 4.5. Hypermetabolic pulmonary nodules and masses bilaterally. Largest measures 5.7 x 6.8 cm and is necrotic in appearance with peripheral hypermetabolism, SUV max 14.0.   Incidental CT findings:   Atherosclerotic calcification of the aorta, aortic valve and coronary arteries. Enlarged pulmonic trunk. Heart is at the upper limits normal in size to mildly enlarged. Small loculated left pleural fluid. Severe centrilobular and paraseptal emphysema.   ABDOMEN/PELVIS:   No abnormal hypermetabolism in the liver, adrenal glands, spleen or pancreas. No hypermetabolic lymph nodes.   Incidental CT findings:   Liver, gallbladder, adrenal glands, kidneys, spleen, pancreas, stomach and bowel are grossly unremarkable. Atherosclerotic calcification of the aorta.   SKELETON:   Scattered hypermetabolic osseous lesions involving the spine, right anterior sixth rib and right iliac wing. Index lucent lesion in the medial right iliac wing measures 2.0 cm (4/175), SUV max 4.9. Hypermetabolic soft tissue mass in the subcutaneous fat along the ventral left upper quadrant measures 2.8 x 2.9 cm, SUV max 15.5.   Incidental CT findings:   Degenerative changes in the spine.   IMPRESSION: 1. 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 2. Small loculated left pleural effusion. 3. Aortic atherosclerosis (ICD10-I70.0). Coronary artery calcification. 4. Enlarged pulmonic trunk, indicative pulmonary arterial hypertension. 5.  Emphysema (ICD10-J43.9).     Electronically Signed   By: Lorin Picket M.D.   On: 03/25/2022 16:37 I personally reviewed the PET/CT images.  Findings are consistent with stage IV cancer.  Large left lower lobe mass with central necrosis.  Bilateral lung masses.  Hilar and mediastinal adenopathy.  Bone metastasis and soft tissue mass left upper quadrant  Impression: Roger [name] is a 66 year old man with a history of tobacco abuse, COPD, hypertension, and anxiety.  He noted a new abdominal wall mass about a month and a half ago.  That has been checked out several times.  Finally a PET/CT was done which showed that mass is hypermetabolic.  Unfortunately there also is extensive disease elsewhere.  He has multiple lung masses, hypermetabolic mediastinal and hilar lymph nodes, and bony metastases.  Findings most consistent with stage IV lung cancer although the abdominal wall mass is a bit of a wildcard.  Certainly that is possible but not a typical finding.  I strongly emphasized the importance of getting a tissue diagnosis so that he could be started on appropriate treatment.  I think the best option would be to biopsy the lung masses with bronchoscopy and but also to do a needle biopsy of the left costal margin soft tissue mass while he is in the OR.  He had a lot of questions and was looking for reassurance that this could be cured.  I spent a long time with him discussing the  fact that this can certainly be treated but without a tissue diagnosis is unknown if it can be cured.  Most likely it is not curable but treatment could prolong and improve quality of life.   Certainly if untreated his quality of life will deteriorate rapidly.  I recommended navigational bronchoscopy and needle biopsy of the left chest wall mass to Mr. Godeaux.  I informed him of the general nature of the procedures including the need for general anesthesia, the endoscopic nature of the bronchoscopy, that this was diagnostic and not therapeutic, and the expected outpatient nature of the procedure.  I informed him of the indications, risks, benefits, and alternatives.  He understands the risks include, but not limited to death, MI, DVT, PE, bleeding, infection, pneumothorax, as well as possibility of other unforeseeable complications.  He accepts the risks and agrees to proceed  He needs an MRI of the brain to complete his metastatic work-up.  Plan: MR brain Super D CT for navigational bronchoscopy planning Navigational bronchoscopy and needle biopsy of left abdominal/chest wall mass on Wednesday, 04/20/2022  [name], MD [facility] [phone]

## 2022-04-15 ENCOUNTER — Ambulatory Visit
Admission: RE | Admit: 2022-04-15 | Discharge: 2022-04-15 | Disposition: A | Discharge status: Home / Self Care | Payer: BC Managed Care – PPO | Source: Ambulatory Visit | Attending: Thoracic Surgery (Cardiothoracic Vascular Surgery) | Admitting: Thoracic Surgery (Cardiothoracic Vascular Surgery)

## 2022-04-15 DIAGNOSIS — Z01818 Encounter for other preprocedural examination: Secondary | ICD-10-CM

## 2022-04-15 DIAGNOSIS — R918 Other nonspecific abnormal finding of lung field: Secondary | ICD-10-CM

## 2022-04-15 NOTE — Pre-Procedure Instructions (Signed)
Surgical Instructions    Your procedure is scheduled on Wednesday, October 18.  Report to [facility] Main Entrance "A" at 10:30 A.M., then check in with the Admitting office.  Call this number if you have problems the morning of surgery:  [phone]   If you have any questions prior to your surgery date call [phone]: Open Monday-Friday 8am-4pm If you experience any cold or flu symptoms such as cough, fever, chills, shortness of breath, etc. between now and your scheduled surgery, please notify us at the above number     Remember:  Do not eat after midnight the night before your surgery  You may drink clear liquids until 9:30AM the morning of your surgery.   Clear liquids allowed are: Water, Non-Citrus Juices (without pulp), Carbonated Beverages, Clear Tea, Black Coffee ONLY (NO MILK, CREAM OR POWDERED CREAMER of any kind), and Gatorade    Take these medicines the morning of surgery with A SIP OF WATER:  ALPRAZolam (XANAX)  atenolol (TENORMIN)  albuterol (PROVENTIL HFA;VENTOLIN HFA) 108 (90 Base) MCG/ACT inhaler  if needed fluticasone (FLONASE) 50 MCG/ACT nasal spray   Follow your surgeon's instructions on when to stop Aspirin.  If no instructions were given by your surgeon then you will need to call the office to get those instructions.    As of today, STOP taking any Aleve, Naproxen, Ibuprofen, Motrin, Advil, Goody's, BC's, all herbal medications, fish oil, and all vitamins.           WHAT DO I DO ABOUT MY DIABETES MEDICATION?   Do not take oral diabetes medicines (pills) the morning of surgery. DO NOT take Metformin (Glucophage) the day of surgery.     HOW TO MANAGE YOUR DIABETES BEFORE AND AFTER SURGERY  Why is it important to control my blood sugar before and after surgery? Improving blood sugar levels before and after surgery helps healing and can limit problems. A way of improving blood sugar control is eating a healthy diet by:  Eating less sugar and  carbohydrates  Increasing activity/exercise  Talking with your doctor about reaching your blood sugar goals High blood sugars (greater than 180 mg/dL) can raise your risk of infections and slow your recovery, so you will need to focus on controlling your diabetes during the weeks before surgery. Make sure that the doctor who takes care of your diabetes knows about your planned surgery including the date and location.  How do I manage my blood sugar before surgery? Check your blood sugar at least 4 times a day, starting 2 days before surgery, to make sure that the level is not too high or low.  Check your blood sugar the morning of your surgery when you wake up and every 2 hours until you get to the Short Stay unit.  If your blood sugar is less than 70 mg/dL, you will need to treat for low blood sugar: Do not take insulin. Treat a low blood sugar (less than 70 mg/dL) with  cup of clear juice (cranberry or apple), 4 glucose tablets, OR glucose gel. Recheck blood sugar in 15 minutes after treatment (to make sure it is greater than 70 mg/dL). If your blood sugar is not greater than 70 mg/dL on recheck, call [phone] for further instructions. Report your blood sugar to the short stay nurse when you get to Short Stay.  If you are admitted to the hospital after surgery: Your blood sugar will be checked by the staff and you will probably be given insulin after  surgery (instead of oral diabetes medicines) to make sure you have good blood sugar levels. The goal for blood sugar control after surgery is 80-180 mg/dL.   [name] is not responsible for any belongings or valuables.    Do NOT Smoke (Tobacco/Vaping)  24 hours prior to your procedure  If you use a CPAP at night, you may bring your mask for your overnight stay.   Contacts, glasses, hearing aids, dentures or partials may not be worn into surgery, please bring cases for these belongings   For patients admitted to the hospital,  discharge time will be determined by your treatment team.   Patients discharged the day of surgery will not be allowed to drive home, and someone needs to stay with them for 24 hours.   SURGICAL WAITING ROOM VISITATION Patients having surgery or a procedure may have no more than 2 support people in the waiting area - these visitors may rotate.   Children under the age of 15 must have an adult with them who is not the patient. If the patient needs to stay at the hospital during part of their recovery, the visitor guidelines for inpatient rooms apply. Pre-op nurse will coordinate an appropriate time for 1 support person to accompany patient in pre-op.  This support person may not rotate.   Please refer to the [facility] website for the visitor guidelines for Inpatients (after your surgery is over and you are in a regular room).    Special instructions:    Oral Hygiene is also important to reduce your risk of infection.  Remember - BRUSH YOUR TEETH THE MORNING OF SURGERY WITH YOUR REGULAR TOOTHPASTE   [facility]- Preparing For Surgery  Before surgery, you can play an important role. Because skin is not sterile, your skin needs to be as free of germs as possible. You can reduce the number of germs on your skin by washing with CHG (chlorahexidine gluconate) Soap before surgery.  CHG is an antiseptic cleaner which kills germs and bonds with the skin to continue killing germs even after washing.     Please do not use if you have an allergy to CHG or antibacterial soaps. If your skin becomes reddened/irritated stop using the CHG.  Do not shave (including legs and underarms) for at least 48 hours prior to first CHG shower. It is OK to shave your face.  Please follow these instructions carefully.     Shower the NIGHT BEFORE SURGERY and the MORNING OF SURGERY with CHG Soap.   If you chose to wash your hair, wash your hair first as usual with your normal shampoo. After you shampoo, rinse your  hair and body thoroughly to remove the shampoo.  Then [employer] and genitals (private parts) with your normal soap and rinse thoroughly to remove soap.  After that Use CHG Soap as you would any other liquid soap. You can apply CHG directly to the skin and wash gently with a scrungie or a clean washcloth.   Apply the CHG Soap to your body ONLY FROM THE NECK DOWN.  Do not use on open wounds or open sores. Avoid contact with your eyes, ears, mouth and genitals (private parts). Wash Face and genitals (private parts)  with your normal soap.   Wash thoroughly, paying special attention to the area where your surgery will be performed.  Thoroughly rinse your body with warm water from the neck down.  DO NOT shower/wash with your normal soap after using and rinsing off  the CHG Soap.  Pat yourself dry with a CLEAN TOWEL.  Wear CLEAN PAJAMAS to bed the night before surgery  Place CLEAN SHEETS on your bed the night before your surgery  DO NOT SLEEP WITH PETS.   Day of Surgery:  Take a shower with CHG soap. Wear Clean/Comfortable clothing the morning of surgery Do not wear jewelry or makeup. Do not wear lotions, powders, perfumes/cologne or deodorant. Do not shave 48 hours prior to surgery.  Men may shave face and neck. Do not bring valuables to the hospital. Do not wear nail polish, gel polish, artificial nails, or any other type of covering on natural nails (fingers and toes) If you have artificial nails or gel coating that need to be removed by a nail salon, please have this removed prior to surgery. Artificial nails or gel coating may interfere with anesthesia's ability to adequately monitor your vital signs.   Remember to brush your teeth WITH YOUR REGULAR TOOTHPASTE.    If you received a COVID test during your pre-op visit, it is requested that you wear a mask when out in public, stay away from anyone that may not be feeling well, and notify your surgeon if you develop symptoms. If you  have been in contact with anyone that has tested positive in the last 10 days, please notify your surgeon.    Please read over the following fact sheets that you were given.

## 2022-04-18 ENCOUNTER — Ambulatory Visit (HOSPITAL_COMMUNITY)
Admission: RE | Admit: 2022-04-18 | Discharge: 2022-04-18 | Disposition: A | Discharge status: Home / Self Care | Payer: BC Managed Care – PPO | Source: Ambulatory Visit | Attending: Thoracic Surgery (Cardiothoracic Vascular Surgery) | Admitting: Thoracic Surgery (Cardiothoracic Vascular Surgery)

## 2022-04-18 ENCOUNTER — Encounter (HOSPITAL_COMMUNITY): Payer: Self-pay

## 2022-04-18 ENCOUNTER — Other Ambulatory Visit: Payer: Self-pay

## 2022-04-18 ENCOUNTER — Encounter (HOSPITAL_COMMUNITY)
Admission: RE | Admit: 2022-04-18 | Discharge: 2022-04-18 | Disposition: A | Discharge status: Home / Self Care | Payer: BC Managed Care – PPO | Source: Ambulatory Visit | Attending: Thoracic Surgery (Cardiothoracic Vascular Surgery) | Admitting: Thoracic Surgery (Cardiothoracic Vascular Surgery)

## 2022-04-18 VITALS — BP 122/79 | HR 70 | Temp 97.7°F | Resp 17 | Ht 71.0 in | Wt 158.3 lb

## 2022-04-18 DIAGNOSIS — C3412 Malignant neoplasm of upper lobe, left bronchus or lung: Secondary | ICD-10-CM | POA: Diagnosis not present

## 2022-04-18 DIAGNOSIS — J449 Chronic obstructive pulmonary disease, unspecified: Secondary | ICD-10-CM | POA: Diagnosis not present

## 2022-04-18 DIAGNOSIS — R222 Localized swelling, mass and lump, trunk: Secondary | ICD-10-CM | POA: Insufficient documentation

## 2022-04-18 DIAGNOSIS — C7989 Secondary malignant neoplasm of other specified sites: Secondary | ICD-10-CM | POA: Diagnosis not present

## 2022-04-18 DIAGNOSIS — Z01818 Encounter for other preprocedural examination: Secondary | ICD-10-CM | POA: Insufficient documentation

## 2022-04-18 DIAGNOSIS — F419 Anxiety disorder, unspecified: Secondary | ICD-10-CM | POA: Diagnosis not present

## 2022-04-18 DIAGNOSIS — E119 Type 2 diabetes mellitus without complications: Secondary | ICD-10-CM | POA: Insufficient documentation

## 2022-04-18 DIAGNOSIS — R918 Other nonspecific abnormal finding of lung field: Secondary | ICD-10-CM | POA: Diagnosis present

## 2022-04-18 DIAGNOSIS — I10 Essential (primary) hypertension: Secondary | ICD-10-CM | POA: Diagnosis not present

## 2022-04-18 DIAGNOSIS — Z1152 Encounter for screening for COVID-19: Secondary | ICD-10-CM | POA: Insufficient documentation

## 2022-04-18 DIAGNOSIS — Z87891 Personal history of nicotine dependence: Secondary | ICD-10-CM | POA: Diagnosis not present

## 2022-04-18 DIAGNOSIS — C3432 Malignant neoplasm of lower lobe, left bronchus or lung: Secondary | ICD-10-CM | POA: Diagnosis not present

## 2022-04-18 LAB — GLUCOSE, CAPILLARY: Glucose-Capillary: 123 mg/dL — ABNORMAL HIGH (ref 70–99)

## 2022-04-18 LAB — COMPREHENSIVE METABOLIC PANEL
ALT: 11 U/L (ref 0–44)
AST: 14 U/L — ABNORMAL LOW (ref 15–41)
Albumin: 3.3 g/dL — ABNORMAL LOW (ref 3.5–5.0)
Alkaline Phosphatase: 103 U/L (ref 38–126)
Anion gap: 9 (ref 5–15)
BUN: <5 mg/dL — ABNORMAL LOW (ref 8–23)
CO2: 27 mmol/L (ref 22–32)
Calcium: 9.5 mg/dL (ref 8.9–10.3)
Chloride: 100 mmol/L (ref 98–111)
Creatinine, Ser: 0.64 mg/dL (ref 0.61–1.24)
GFR, Estimated: >60 mL/min (ref >60)
Glucose, Bld: 126 mg/dL — ABNORMAL HIGH (ref 70–99)
Potassium: 3.9 mmol/L (ref 3.5–5.1)
Sodium: 136 mmol/L (ref 135–145)
Total Bilirubin: 0.5 mg/dL (ref 0.3–1.2)
Total Protein: 8 g/dL (ref 6.5–8.1)

## 2022-04-18 LAB — CBC
HCT: 39.9 % (ref 39.0–52.0)
Hemoglobin: 12.8 g/dL — ABNORMAL LOW (ref 13.0–17.0)
MCH: 29.4 pg (ref 26.0–34.0)
MCHC: 32.1 g/dL (ref 30.0–36.0)
MCV: 91.5 fL (ref 80.0–100.0)
Platelets: 269 10*3/uL (ref 150–400)
RBC: 4.36 MIL/uL (ref 4.22–5.81)
RDW: 12 % (ref 11.5–15.5)
WBC: 9.7 10*3/uL (ref 4.0–10.5)
nRBC: 0 % (ref 0.0–0.2)

## 2022-04-18 LAB — PROTIME-INR
INR: 1.1 (ref 0.8–1.2)
Prothrombin Time: 14.5 seconds (ref 11.4–15.2)

## 2022-04-18 LAB — SURGICAL PCR SCREEN
MRSA, PCR: NEGATIVE
Staphylococcus aureus: NEGATIVE

## 2022-04-18 LAB — APTT: aPTT: 36 seconds (ref 24–36)

## 2022-04-18 LAB — SARS CORONAVIRUS 2 (TAT 6-24 HRS): SARS Coronavirus 2: NEGATIVE

## 2022-04-18 NOTE — Pre-Procedure Instructions (Signed)
PCP - Bing Matter  Cardiologist - denies  PPM/ICD - N/A  Chest x-ray - 04/18/2022  EKG - 04/18/2022  Stress Test - 2010 ECHO - 2010 Cardiac Cath - denies  Sleep Study - denies   Fasting Blood Sugar - CBG 123 today. Hgb A1c 7.0 on 03/31/22. Pt states that his PCP says that he is not Diabetic or pre-Diabetic. Pt states that he does not check his blood sugars at home. Pt states his PCP started him on metformin, and he has made diet changes to bring his blood sugars down.   Last dose of GLP1 agonist-  denies   Blood Thinner Instructions: N/A Aspirin Instructions:ASA 162mg - per Levonne Spiller, RN ok to keep taking, hold DOS. Pt verbalized understanding.   ERAS Protcol - ERAS per protocol, no diet order per surgeon.   COVID TEST- 04/18/2022    Anesthesia review: no  Patient denies shortness of breath, fever, cough and chest pain at PAT appointment   All instructions explained to the patient, with a verbal understanding of the material. Patient agrees to go over the instructions while at home for a better understanding. Patient also instructed to self quarantine after being tested for COVID-19. The opportunity to ask questions was provided.

## 2022-04-20 ENCOUNTER — Other Ambulatory Visit: Payer: Self-pay

## 2022-04-20 ENCOUNTER — Ambulatory Visit (HOSPITAL_COMMUNITY): Payer: BC Managed Care – PPO | Admitting: Vascular Surgery

## 2022-04-20 ENCOUNTER — Encounter (HOSPITAL_COMMUNITY)
Admission: RE | Disposition: A | Discharge status: Home / Self Care | Payer: Self-pay | Source: Home / Self Care | Attending: Thoracic Surgery (Cardiothoracic Vascular Surgery)

## 2022-04-20 ENCOUNTER — Encounter (HOSPITAL_COMMUNITY): Payer: Self-pay | Admitting: Thoracic Surgery (Cardiothoracic Vascular Surgery)

## 2022-04-20 ENCOUNTER — Ambulatory Visit (HOSPITAL_COMMUNITY): Payer: BC Managed Care – PPO

## 2022-04-20 ENCOUNTER — Ambulatory Visit (HOSPITAL_COMMUNITY)
Admission: RE | Admit: 2022-04-20 | Discharge: 2022-04-20 | Disposition: A | Discharge status: Home / Self Care | Payer: BC Managed Care – PPO | Attending: Thoracic Surgery (Cardiothoracic Vascular Surgery) | Admitting: Thoracic Surgery (Cardiothoracic Vascular Surgery)

## 2022-04-20 DIAGNOSIS — Z1152 Encounter for screening for COVID-19: Secondary | ICD-10-CM | POA: Insufficient documentation

## 2022-04-20 DIAGNOSIS — J449 Chronic obstructive pulmonary disease, unspecified: Secondary | ICD-10-CM | POA: Insufficient documentation

## 2022-04-20 DIAGNOSIS — E119 Type 2 diabetes mellitus without complications: Secondary | ICD-10-CM

## 2022-04-20 DIAGNOSIS — C3432 Malignant neoplasm of lower lobe, left bronchus or lung: Secondary | ICD-10-CM | POA: Insufficient documentation

## 2022-04-20 DIAGNOSIS — R918 Other nonspecific abnormal finding of lung field: Secondary | ICD-10-CM

## 2022-04-20 DIAGNOSIS — C3412 Malignant neoplasm of upper lobe, left bronchus or lung: Secondary | ICD-10-CM

## 2022-04-20 DIAGNOSIS — I10 Essential (primary) hypertension: Secondary | ICD-10-CM | POA: Insufficient documentation

## 2022-04-20 DIAGNOSIS — R222 Localized swelling, mass and lump, trunk: Secondary | ICD-10-CM

## 2022-04-20 DIAGNOSIS — Z87891 Personal history of nicotine dependence: Secondary | ICD-10-CM | POA: Insufficient documentation

## 2022-04-20 DIAGNOSIS — F419 Anxiety disorder, unspecified: Secondary | ICD-10-CM | POA: Insufficient documentation

## 2022-04-20 DIAGNOSIS — C44529 Squamous cell carcinoma of skin of other part of trunk: Secondary | ICD-10-CM

## 2022-04-20 DIAGNOSIS — C7989 Secondary malignant neoplasm of other specified sites: Secondary | ICD-10-CM | POA: Insufficient documentation

## 2022-04-20 HISTORY — PX: VIDEO BRONCHOSCOPY WITH ENDOBRONCHIAL NAVIGATION: SHX6175

## 2022-04-20 HISTORY — PX: MASS EXCISION: SHX2000

## 2022-04-20 LAB — COMPREHENSIVE METABOLIC PANEL
ALT: 11 U/L (ref 0–44)
AST: 15 U/L (ref 15–41)
Albumin: 2.9 g/dL — ABNORMAL LOW (ref 3.5–5.0)
Alkaline Phosphatase: 99 U/L (ref 38–126)
Anion gap: 6 (ref 5–15)
BUN: 7 mg/dL — ABNORMAL LOW (ref 8–23)
CO2: 23 mmol/L (ref 22–32)
Calcium: 8.7 mg/dL — ABNORMAL LOW (ref 8.9–10.3)
Chloride: 106 mmol/L (ref 98–111)
Creatinine, Ser: 0.6 mg/dL — ABNORMAL LOW (ref 0.61–1.24)
GFR, Estimated: >60 mL/min (ref >60)
Glucose, Bld: 149 mg/dL — ABNORMAL HIGH (ref 70–99)
Potassium: 3.8 mmol/L (ref 3.5–5.1)
Sodium: 135 mmol/L (ref 135–145)
Total Bilirubin: 0.5 mg/dL (ref 0.3–1.2)
Total Protein: 7 g/dL (ref 6.5–8.1)

## 2022-04-20 LAB — GLUCOSE, CAPILLARY: Glucose-Capillary: 139 mg/dL — ABNORMAL HIGH (ref 70–99)

## 2022-04-20 SURGERY — VIDEO BRONCHOSCOPY WITH ENDOBRONCHIAL NAVIGATION
Anesthesia: General | Laterality: Right

## 2022-04-20 MED ORDER — ROCURONIUM BROMIDE 10 MG/ML (PF) SYRINGE
PREFILLED_SYRINGE | INTRAVENOUS | Status: DC | PRN
  Administered 2022-04-20: 50 mg via INTRAVENOUS
  Administered 2022-04-20: 20 mg via INTRAVENOUS
  Administered 2022-04-20: 50 mg via INTRAVENOUS
  Administered 2022-04-20: 20 mg via INTRAVENOUS

## 2022-04-20 MED ORDER — DEXAMETHASONE SODIUM PHOSPHATE 10 MG/ML IJ SOLN
INTRAMUSCULAR | Status: AC
  Filled 2022-04-20: qty 2

## 2022-04-20 MED ORDER — CHLORHEXIDINE GLUCONATE 0.12 % MT SOLN
15.0000 mL | Freq: Once | OROMUCOSAL | Status: AC
  Administered 2022-04-20: 15 mL via OROMUCOSAL

## 2022-04-20 MED ORDER — FENTANYL CITRATE (PF) 250 MCG/5ML IJ SOLN
INTRAMUSCULAR | Status: DC | PRN
  Administered 2022-04-20 (×2): 50 ug via INTRAVENOUS

## 2022-04-20 MED ORDER — 0.9 % SODIUM CHLORIDE (POUR BTL) OPTIME
TOPICAL | Status: DC | PRN
  Administered 2022-04-20: 1000 mL

## 2022-04-20 MED ORDER — ONDANSETRON HCL 4 MG/2ML IJ SOLN
INTRAMUSCULAR | Status: DC | PRN
  Administered 2022-04-20: 4 mg via INTRAVENOUS

## 2022-04-20 MED ORDER — MIDAZOLAM HCL 2 MG/2ML IJ SOLN
INTRAMUSCULAR | Status: AC
  Filled 2022-04-20: qty 2

## 2022-04-20 MED ORDER — CHLORHEXIDINE GLUCONATE 0.12 % MT SOLN
15.0000 mL | Freq: Once | OROMUCOSAL | Status: AC
  Filled 2022-04-20: qty 15

## 2022-04-20 MED ORDER — LACTATED RINGERS IV SOLN: INTRAVENOUS | Status: DC

## 2022-04-20 MED ORDER — EPINEPHRINE PF 1 MG/ML IJ SOLN
INTRAMUSCULAR | Status: DC | PRN
  Administered 2022-04-20: 1 mg via ENDOTRACHEOPULMONARY

## 2022-04-20 MED ORDER — OXYCODONE HCL 5 MG/5ML PO SOLN: 5.0000 mg | Freq: Once | ORAL | Status: DC | PRN

## 2022-04-20 MED ORDER — OXYCODONE HCL 5 MG PO TABS: 5.0000 mg | ORAL_TABLET | Freq: Once | ORAL | Status: DC | PRN

## 2022-04-20 MED ORDER — PHENYLEPHRINE HCL-NACL 20-0.9 MG/250ML-% IV SOLN
INTRAVENOUS | Status: DC | PRN
  Administered 2022-04-20: 40 ug/min via INTRAVENOUS

## 2022-04-20 MED ORDER — ORAL CARE MOUTH RINSE: 15.0000 mL | Freq: Once | OROMUCOSAL | Status: AC

## 2022-04-20 MED ORDER — FENTANYL CITRATE (PF) 250 MCG/5ML IJ SOLN
INTRAMUSCULAR | Status: AC
  Filled 2022-04-20: qty 5

## 2022-04-20 MED ORDER — SUGAMMADEX SODIUM 200 MG/2ML IV SOLN
INTRAVENOUS | Status: DC | PRN
  Administered 2022-04-20: 300 mg via INTRAVENOUS

## 2022-04-20 MED ORDER — FENTANYL CITRATE (PF) 100 MCG/2ML IJ SOLN: 25.0000 ug | INTRAMUSCULAR | Status: DC | PRN

## 2022-04-20 MED ORDER — PROPOFOL 10 MG/ML IV BOLUS
INTRAVENOUS | Status: AC
  Filled 2022-04-20: qty 20

## 2022-04-20 MED ORDER — PROPOFOL 10 MG/ML IV BOLUS
INTRAVENOUS | Status: DC | PRN
  Administered 2022-04-20: 100 mg via INTRAVENOUS

## 2022-04-20 MED ORDER — ROCURONIUM BROMIDE 10 MG/ML (PF) SYRINGE
PREFILLED_SYRINGE | INTRAVENOUS | Status: AC
  Filled 2022-04-20: qty 10

## 2022-04-20 MED ORDER — EPINEPHRINE PF 1 MG/ML IJ SOLN
INTRAMUSCULAR | Status: AC
  Filled 2022-04-20: qty 1

## 2022-04-20 MED ORDER — LIDOCAINE 2% (20 MG/ML) 5 ML SYRINGE
INTRAMUSCULAR | Status: DC | PRN
  Administered 2022-04-20: 100 mg via INTRAVENOUS

## 2022-04-20 MED ORDER — ONDANSETRON HCL 4 MG/2ML IJ SOLN: 4.0000 mg | Freq: Once | INTRAMUSCULAR | Status: DC | PRN

## 2022-04-20 MED ORDER — MIDAZOLAM HCL 2 MG/2ML IJ SOLN
INTRAMUSCULAR | Status: DC | PRN
  Administered 2022-04-20 (×2): 1 mg via INTRAVENOUS

## 2022-04-20 MED ORDER — ONDANSETRON HCL 4 MG/2ML IJ SOLN
INTRAMUSCULAR | Status: AC
  Filled 2022-04-20: qty 4

## 2022-04-20 MED ORDER — DEXAMETHASONE SODIUM PHOSPHATE 10 MG/ML IJ SOLN
INTRAMUSCULAR | Status: DC | PRN
  Administered 2022-04-20: 10 mg via INTRAVENOUS

## 2022-04-20 SURGICAL SUPPLY — 112 items
ADAPTER BRONCHOSCOPE OLYMPUS (ADAPTER) ×3 IMPLANT
ADAPTER VALVE BIOPSY EBUS (MISCELLANEOUS) IMPLANT
ADH SKN CLS APL DERMABOND .7 (GAUZE/BANDAGES/DRESSINGS) ×2
ADPR BSCP OLMPS EDG (ADAPTER) ×2
ADPTR VALVE BIOPSY EBUS (MISCELLANEOUS)
APL SRG 22X2 LUM MLBL SLNT (VASCULAR PRODUCTS)
APPLICATOR TIP EXT COSEAL (VASCULAR PRODUCTS) IMPLANT
APPLIER CLIP LOGIC TI 5 (MISCELLANEOUS) IMPLANT
APPLIER CLIP ROT 10 11.4 M/L (STAPLE)
APR CLP MED LRG 11.4X10 (STAPLE)
APR CLP MED LRG 33X5 (MISCELLANEOUS)
BIT DRILL 7/64X5 DISP (BIT) ×3 IMPLANT
BLADE CLIPPER SURG (BLADE) ×3 IMPLANT
BLADE OSCILLATING /SAGITTAL (BLADE) ×3 IMPLANT
BLADE SAW GIGLI 510 (BLADE) ×3 IMPLANT
BOWL SMART MIX CTS (DISPOSABLE) IMPLANT
BRUSH BIOPSY BRONCH 10 SDTNB (MISCELLANEOUS) IMPLANT
BRUSH SUPERTRAX BIOPSY (INSTRUMENTS) IMPLANT
BRUSH SUPERTRAX NDL-TIP CYTO (INSTRUMENTS) ×3 IMPLANT
CANISTER SUCT 3000ML PPV (MISCELLANEOUS) ×3 IMPLANT
CATH HYDRAGLIDE XL THORACIC (CATHETERS) IMPLANT
CATH THORACIC 28FR (CATHETERS) IMPLANT
CATH THORACIC 36FR (CATHETERS) IMPLANT
CATH THORACIC 36FR RT ANG (CATHETERS) IMPLANT
CLIP APPLIE ROT 10 11.4 M/L (STAPLE) IMPLANT
CNTNR URN SCR LID CUP LEK RST (MISCELLANEOUS) ×6 IMPLANT
CONN Y 3/8X3/8X3/8  BEN (MISCELLANEOUS)
CONN Y 3/8X3/8X3/8 BEN (MISCELLANEOUS) IMPLANT
CONT SPEC 4OZ STRL OR WHT (MISCELLANEOUS) ×6
COVER BACK TABLE 60X90IN (DRAPES) ×3 IMPLANT
COVER SURGICAL LIGHT HANDLE (MISCELLANEOUS) ×6 IMPLANT
DERMABOND ADVANCED .7 DNX12 (GAUZE/BANDAGES/DRESSINGS) ×3 IMPLANT
DRAIN CHANNEL 28F RND 3/8 FF (WOUND CARE) IMPLANT
DRAPE CHEST BREAST 15X10 FENES (DRAPES) ×3 IMPLANT
ELECT REM PT RETURN 9FT ADLT (ELECTROSURGICAL) ×2
ELECTRODE REM PT RTRN 9FT ADLT (ELECTROSURGICAL) ×3 IMPLANT
FELT TEFLON 1X6 (MISCELLANEOUS) IMPLANT
FILTER STRAW FLUID ASPIR (MISCELLANEOUS) ×3 IMPLANT
FORCEPS BIOP SUPERTRX PREMAR (INSTRUMENTS) IMPLANT
GAUZE 4X4 16PLY ~~LOC~~+RFID DBL (SPONGE) ×3 IMPLANT
GAUZE SPONGE 4X4 12PLY STRL (GAUZE/BANDAGES/DRESSINGS) ×3 IMPLANT
GLOVE SS BIOGEL STRL SZ 7.5 (GLOVE) ×3 IMPLANT
GLOVE SURG SIGNA 7.5 PF LTX (GLOVE) ×3 IMPLANT
GOWN STRL REUS W/ TWL LRG LVL3 (GOWN DISPOSABLE) ×6 IMPLANT
GOWN STRL REUS W/ TWL XL LVL3 (GOWN DISPOSABLE) ×3 IMPLANT
GOWN STRL REUS W/TWL LRG LVL3 (GOWN DISPOSABLE) ×4
GOWN STRL REUS W/TWL XL LVL3 (GOWN DISPOSABLE) ×2
HEMOSTAT SURGICEL 2X14 (HEMOSTASIS) IMPLANT
KIT BASIN OR (CUSTOM PROCEDURE TRAY) ×3 IMPLANT
KIT CLEAN ENDO COMPLIANCE (KITS) ×3 IMPLANT
KIT ILLUMISITE 180 PROCEDURE (KITS) IMPLANT
KIT ILLUMISITE 90 PROCEDURE (KITS) IMPLANT
KIT TURNOVER KIT B (KITS) ×3 IMPLANT
MARKER SKIN DUAL TIP RULER LAB (MISCELLANEOUS) ×3 IMPLANT
NDL BIOPSY 14X6 SOFT TISS (NEEDLE) IMPLANT
NDL SUPERTRX PREMARK BIOPSY (NEEDLE) IMPLANT
NEEDLE BIOPSY 14X6 SOFT TISS (NEEDLE) ×2 IMPLANT
NEEDLE SUPERTRX PREMARK BIOPSY (NEEDLE) ×2 IMPLANT
NS IRRIG 1000ML POUR BTL (IV SOLUTION) ×6 IMPLANT
OIL SILICONE PENTAX (PARTS (SERVICE/REPAIRS)) ×3 IMPLANT
PACK CHEST (CUSTOM PROCEDURE TRAY) ×3 IMPLANT
PAD ARMBOARD 7.5X6 YLW CONV (MISCELLANEOUS) ×6 IMPLANT
PATCHES PATIENT (LABEL) ×9 IMPLANT
SEALANT PROGEL (MISCELLANEOUS) IMPLANT
SEALANT SURG COSEAL 4ML (VASCULAR PRODUCTS) IMPLANT
SEALANT SURG COSEAL 8ML (VASCULAR PRODUCTS) IMPLANT
SPECIMEN JAR MEDIUM (MISCELLANEOUS) ×3 IMPLANT
SPIKE FLUID TRANSFER (MISCELLANEOUS) ×3 IMPLANT
SPONGE GAUZE 2X2 8PLY STRL LF (GAUZE/BANDAGES/DRESSINGS) IMPLANT
SPONGE T-LAP 18X18 ~~LOC~~+RFID (SPONGE) ×12 IMPLANT
SPONGE T-LAP 4X18 ~~LOC~~+RFID (SPONGE) ×3 IMPLANT
SPONGE TONSIL TAPE 1 RFD (DISPOSABLE) ×3 IMPLANT
STAPLER VISISTAT 35W (STAPLE) IMPLANT
SUT PROLENE 0 CT 1 CR/8 (SUTURE) IMPLANT
SUT PROLENE 1 CT (SUTURE) IMPLANT
SUT PROLENE 1 MO 6 (SUTURE) IMPLANT
SUT PROLENE 1 XLH 60 (SUTURE) IMPLANT
SUT PROLENE 2 0 CR (SUTURE) IMPLANT
SUT PROLENE 2 TP 1 (SUTURE) IMPLANT
SUT PROLENE 3 0 SH DA (SUTURE) IMPLANT
SUT PROLENE 4 0 RB 1 (SUTURE)
SUT PROLENE 4-0 RB1 .5 CRCL 36 (SUTURE) IMPLANT
SUT SILK 0 FSL (SUTURE) ×6 IMPLANT
SUT SILK 2 0SH CR/8 30 (SUTURE) ×3 IMPLANT
SUT VIC AB 1 CTX 18 (SUTURE) ×3 IMPLANT
SUT VIC AB 1 CTX 36 (SUTURE) ×2
SUT VIC AB 1 CTX36XBRD ANBCTR (SUTURE) ×3 IMPLANT
SUT VIC AB 2-0 CTX 36 (SUTURE) ×3 IMPLANT
SUT VIC AB 3-0 MH 27 (SUTURE) IMPLANT
SUT VIC AB 3-0 X1 27 (SUTURE) ×3 IMPLANT
SUT VICRYL 2 TP 1 (SUTURE) ×3 IMPLANT
SYR 20ML ECCENTRIC (SYRINGE) ×3 IMPLANT
SYR 20ML LL LF (SYRINGE) ×6 IMPLANT
SYR 30ML LL (SYRINGE) IMPLANT
SYR 50ML LL SCALE MARK (SYRINGE) ×3 IMPLANT
SYR 5ML LL (SYRINGE) ×3 IMPLANT
SYR TB 1ML LUER SLIP (SYRINGE) IMPLANT
SYSTEM SAHARA CHEST DRAIN ATS (WOUND CARE) ×3 IMPLANT
TAPE CLOTH 4X10 WHT NS (GAUZE/BANDAGES/DRESSINGS) ×3 IMPLANT
TAPE CLOTH SURG 4X10 WHT LF (GAUZE/BANDAGES/DRESSINGS) IMPLANT
TAPE UMBILICAL COTTON 1/8X30 (MISCELLANEOUS) IMPLANT
TOWEL GREEN STERILE (TOWEL DISPOSABLE) ×3 IMPLANT
TOWEL GREEN STERILE FF (TOWEL DISPOSABLE) ×3 IMPLANT
TRAP SPECIMEN MUCUS 40CC (MISCELLANEOUS) ×3 IMPLANT
TRAY FOLEY MTR SLVR 16FR STAT (SET/KITS/TRAYS/PACK) ×3 IMPLANT
TUBE CONNECTING 20X1/4 (TUBING) ×6 IMPLANT
TUNNELER SHEATH ON-Q 11GX8 DSP (PAIN MANAGEMENT) IMPLANT
UNDERPAD 30X36 HEAVY ABSORB (UNDERPADS AND DIAPERS) ×3 IMPLANT
VALVE BIOPSY  SINGLE USE (MISCELLANEOUS) ×4
VALVE BIOPSY SINGLE USE (MISCELLANEOUS) ×3 IMPLANT
VALVE SUCTION BRONCHIO DISP (MISCELLANEOUS) ×3 IMPLANT
WATER STERILE IRR 1000ML POUR (IV SOLUTION) ×6 IMPLANT

## 2022-04-20 NOTE — Anesthesia Preprocedure Evaluation (Signed)
Anesthesia Evaluation  Patient identified by MRN, date of birth, ID band Patient awake    Reviewed: Allergy & Precautions, NPO status , Patient's Chart, lab work & pertinent test results  History of Anesthesia Complications (+) history of anesthetic complications  Airway Mallampati: II  TM Distance: >3 FB Neck ROM: Full    Dental no notable dental hx.    Pulmonary asthma , COPD,  COPD inhaler, former smoker,    Pulmonary exam normal breath sounds clear to auscultation       Cardiovascular hypertension, Pt. on medications and Pt. on home beta blockers Normal cardiovascular exam Rhythm:Regular Rate:Normal     Neuro/Psych Anxiety negative neurological ROS     GI/Hepatic negative GI ROS, Neg liver ROS,   Endo/Other  negative endocrine ROS  Renal/GU negative Renal ROS  negative genitourinary   Musculoskeletal negative musculoskeletal ROS (+)   Abdominal   Peds negative pediatric ROS (+)  Hematology negative hematology ROS (+)   Anesthesia Other Findings   Reproductive/Obstetrics negative OB ROS                             Anesthesia Physical Anesthesia Plan  ASA: 3  Anesthesia Plan: General   Post-op Pain Management: Minimal or no pain anticipated   Induction: Intravenous  PONV Risk Score and Plan: 2 and Ondansetron, Dexamethasone and Treatment may vary due to age or medical condition  Airway Management Planned: Oral ETT  Additional Equipment:   Intra-op Plan:   Post-operative Plan: Extubation in OR  Informed Consent: I have reviewed the patients History and Physical, chart, labs and discussed the procedure including the risks, benefits and alternatives for the proposed anesthesia with the patient or authorized representative who has indicated his/her understanding and acceptance.     Dental advisory given  Plan Discussed with: CRNA and Surgeon  Anesthesia Plan Comments:          Anesthesia Quick Evaluation

## 2022-04-20 NOTE — Transfer of Care (Signed)
Immediate Anesthesia Transfer of Care Note  Patient: Roger [name]  Procedure(s) Performed: VIDEO BRONCHOSCOPY WITH ENDOBRONCHIAL NAVIGATION NEEDLE BIOPSY OF CHEST WALL MASS RIGHT LOWER CHEST WALL (Right)  Patient Location: PACU  Anesthesia Type:General  Level of Consciousness: awake, alert  and oriented  Airway & Oxygen Therapy: Patient Spontanous Breathing and Patient connected to face mask oxygen  Post-op Assessment: Report given to RN, Post -op Vital signs reviewed and stable and Patient moving all extremities  Post vital signs: Reviewed and stable  Last Vitals:  Vitals Value Taken Time  BP 113/60 04/20/22 1508  Temp    Pulse 64 04/20/22 1510  Resp 22 04/20/22 1510  SpO2 94 % 04/20/22 1510  Vitals shown include unvalidated device data.  Last Pain:  Vitals:   04/20/22 1117  TempSrc:   PainSc: 0-No pain         Complications: No notable events documented.

## 2022-04-20 NOTE — Discharge Instructions (Addendum)
Do not drive or engage in heavy physical activity for 24 hours.  You may resume normal activities tomorrow.  You may cough up small amounts of blood over the next few days.    You may use acetaminophen (Tylenol) if needed for discomfort.  Avoid taking aspirin for 24 hours.  Call [phone] if you develop chest pain, shortness of breath, fever > 101 F or cough up more than a tablespoon of blood.  My office will arrange a follow up appointment for next week to go over results.  We will arrange an appointment with Dr. Julien Nordmann an Oncologist to go over treatment options with you.

## 2022-04-20 NOTE — Brief Op Note (Addendum)
04/20/2022  3:22 PM  PATIENT:  Roger [name]  66 y.o. male  PRE-OPERATIVE DIAGNOSIS:  LUNG MASSES CHEST WALL MASS  POST-OPERATIVE DIAGNOSIS:  LUNG MASSES CHEST WALL MASS  PROCEDURE:   TRU CUT NEEDLE BIOPSY OF LEFT COSTAL MARGIN MASS ELECTROMAGNETIC NAVIGATIONAL BRONCHOSCOPY with BRUSHINGS and TRANSBRONCHIAL BIOPSIES  SURGEON:  Surgeon(s) and Role:    * [name], MD - Primary  PHYSICIAN ASSISTANT:   ASSISTANTS: none   ANESTHESIA:   general  EBL:  2 mL   BLOOD ADMINISTERED:none  DRAINS: none   LOCAL MEDICATIONS USED:  NONE  SPECIMEN:  Source of Specimen:  Chest wall mass, LUL and LLL masses  DISPOSITION OF SPECIMEN:  PATHOLOGY  COUNTS:  NO endoscopic  TOURNIQUET:  * No tourniquets in log *  DICTATION: .Other Dictation: Dictation Number -  PLAN OF CARE: Discharge to home after PACU  PATIENT DISPOSITION:  PACU - hemodynamically stable.   Delay start of Pharmacological VTE agent (>24hrs) due to surgical blood loss or risk of bleeding: not applicable  FLUORO TIME 251 sec, dose 33.41 mGy

## 2022-04-20 NOTE — Interval H&P Note (Signed)
History and Physical Interval Note:  04/20/2022 12:36 PM  Roger [name]  has presented today for surgery, with the diagnosis of LUNG MASSES CHEST WALL MASS.  The various methods of treatment have been discussed with the patient and family. After consideration of risks, benefits and other options for treatment, the patient has consented to  Procedure(s): VIDEO BRONCHOSCOPY WITH ENDOBRONCHIAL NAVIGATION (N/A) NEEDLE BIOPSY OF CHEST WALL MASS RIGHT LOWER CHEST WALL (Right) as a surgical intervention.  The patient's history has been reviewed, patient examined, no change in status, stable for surgery.  I have reviewed the patient's chart and labs.  Questions were answered to the patient's satisfaction.     [name]

## 2022-04-20 NOTE — Op Note (Signed)
NAME: [name], Roger [name] RECORD NO: [id number] ACCOUNT NO: [id number] DATE OF BIRTH: May 08, 1956 FACILITY: [facility] LOCATION: [facility] PHYSICIAN: Revonda Standard. Roxan Hockey, MD  Operative Report   DATE OF PROCEDURE: 04/20/2022  PREOPERATIVE DIAGNOSIS:  Bilateral lung masses and left chest wall mass.  POSTOPERATIVE DIAGNOSIS:  Stage IV carcinoma.  PROCEDURE:   1.Tru-Cut needle biopsy of left chest wall mass  2.Electromagnetic navigational bronchoscopy with brushings and transbronchial biopsies.  SURGEON:  Revonda Standard. Roxan Hockey, MD  ASSISTANT:  None.  ANESTHESIA:  General.  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  Specimens from left upper lobe and left lower lobe masses were adequate for diagnostic purposes.  CLINICAL NOTE:  [name] is a 66 year old gentleman with a history of tobacco abuse, who presented with enlarging abdominal wall mass.  A PET/CT showed the mass was hypermetabolic.  He also had a large lung masses in the left upper and left lower  lobes, a smaller mass in the right lower lobe, adenopathy, as well as evidence of rib metastases.  He was advised to undergo navigational bronchoscopy for biopsy of the lung masses and a needle biopsy of the abdominal wall mass.  The indications, risks,  benefits, and alternatives were discussed in detail with the patient.  He understood and accepted the risks and agreed to proceed.  DESCRIPTION OF PROCEDURE:  [name] was brought to the operating room on 04/20/2022.  Planning for the navigational bronchoscopy was done on the console.  He had induction of general anesthesia.  An area around the mass along the left costal margin was  prepped and draped in the usual sterile fashion.  A timeout was performed.  A Tru-Cut needle was inserted into the mass and a specimen obtained.  The first biopsy was sent for frozen section.  Three additional passes were performed with different angles into  the mass on each  sample. The remaining samples were sent for permanent pathology.  There was some minor bleeding that stopped with pressure.  Dermabond was applied at the site where the needle had entered. The frozen section subsequently returned  showing squamous cell carcinoma.  Flexible fiberoptic bronchoscopy then was performed via the endotracheal tube, it revealed normal endobronchial anatomy with the exception of swelling at the left lower lobe basilar segmental. The trachea and right bronchial tree were within normal  limits.  On the left side, the left mainstem, left upper lobe within normal limits, but there was significant edema and near complete occlusion of the left lower lobe bronchus.  The locatable guide for navigation was placed.  Registration was performed.   There was good correlation of the video and virtual bronchoscopy.  The catheter then was advanced towards the left upper lobe lesion.  The first pathway that had been planned was selected.  The needle was advanced to within 2 cm of the center of the lesion,  but slightly off center.  Three needle brushings were obtained and then multiple transbronchial biopsies were obtained.  Fluoroscopy was used for all sampling.  The brushings were examined immediately.  While awaiting those, the catheter was repositioned  through the second pathway.  This had better alignment with the mass and the brushings and transbronchial biopsies were repeated.  The initial brushings were negative, but the second set of brushings were deemed adequate.  The locatable guide then was  directed to the left lower lobe bronchus and the appropriate subsegmental bronchus was cannulated.  The catheter went easily into the  middle of the mass and brushings and transbronchial biopsies were obtained.  These brushings showed some atypical cells,  but there was a lot of necrotic debris.  Locatable guide was removed and then a brushing was performed of the lower lobe bronchus directly, which  was adequate for diagnosis.  Multiple biopsies were obtained, inspection was made with the fluoroscope and  there was no evidence of pneumothorax.  There was some minor bleeding with the sampling. Dilute epinephrine solution was applied.  There was no ongoing bleeding at the end of the procedure.  The patient then was extubated in the operating room and taken  to the postoperative care unit in good condition.   PUS D: 04/20/2022 5:02:39 pm T: 04/20/2022 8:50:00 pm  JOB: [id number]/ [id number]

## 2022-04-20 NOTE — Anesthesia Postprocedure Evaluation (Signed)
Anesthesia Post Note  Patient: Phillipe Clemon Down  Procedure(s) Performed: VIDEO BRONCHOSCOPY WITH ENDOBRONCHIAL NAVIGATION NEEDLE BIOPSY OF CHEST WALL MASS RIGHT LOWER CHEST WALL (Right)     Patient location during evaluation: PACU Anesthesia Type: General Level of consciousness: awake and alert, oriented and patient cooperative Pain management: pain level controlled Vital Signs Assessment: post-procedure vital signs reviewed and stable Respiratory status: spontaneous breathing, nonlabored ventilation and respiratory function stable Cardiovascular status: blood pressure returned to baseline and stable Postop Assessment: no apparent nausea or vomiting Anesthetic complications: no   No notable events documented.  Last Vitals:  Vitals:   04/20/22 1530 04/20/22 1545  BP: (!) 126/52   Pulse: 64 61  Resp: 20 18  Temp:    SpO2: 93% 96%    Last Pain:  Vitals:   04/20/22 1545  TempSrc:   PainSc: 0-No pain                 Pervis Hocking

## 2022-04-20 NOTE — Anesthesia Procedure Notes (Signed)
Procedure Name: Intubation Date/Time: 04/20/2022 1:38 PM  Performed by: Leonor Liv, CRNAPre-anesthesia Checklist: Patient identified, Emergency Drugs available, Suction available and Patient being monitored Patient Re-evaluated:Patient Re-evaluated prior to induction Oxygen Delivery Method: Circle System Utilized Preoxygenation: Pre-oxygenation with 100% oxygen Induction Type: IV induction Ventilation: Mask ventilation without difficulty Laryngoscope Size: Mac and 4 Grade View: Grade I Tube type: Oral Tube size: 9.0 mm Number of attempts: 1 Airway Equipment and Method: Stylet and Oral airway Placement Confirmation: ETT inserted through vocal cords under direct vision, positive ETCO2 and breath sounds checked- equal and bilateral Secured at: 22 cm Tube secured with: Tape Dental Injury: Teeth and Oropharynx as per pre-operative assessment

## 2022-04-21 ENCOUNTER — Encounter (HOSPITAL_COMMUNITY): Payer: Self-pay | Admitting: Thoracic Surgery (Cardiothoracic Vascular Surgery)

## 2022-04-21 ENCOUNTER — Telehealth: Payer: Self-pay | Admitting: Internal Medicine

## 2022-04-21 LAB — SURGICAL PATHOLOGY

## 2022-04-21 NOTE — Telephone Encounter (Signed)
Scheduled appt per 10/19 referral. Pt is aware of appt date and time. Pt is aware to arrive 15 mins prior to appt time and to bring and updated insurance card. Pt is aware of appt location.

## 2022-04-27 ENCOUNTER — Ambulatory Visit (INDEPENDENT_AMBULATORY_CARE_PROVIDER_SITE_OTHER): Payer: BC Managed Care – PPO | Admitting: Thoracic Surgery (Cardiothoracic Vascular Surgery)

## 2022-04-27 VITALS — BP 140/78 | HR 84 | Resp 20 | Ht 71.0 in | Wt 156.0 lb

## 2022-04-27 DIAGNOSIS — Z9889 Other specified postprocedural states: Secondary | ICD-10-CM

## 2022-04-27 DIAGNOSIS — C3482 Malignant neoplasm of overlapping sites of left bronchus and lung: Secondary | ICD-10-CM | POA: Diagnosis not present

## 2022-04-27 LAB — CYTOLOGY - NON PAP

## 2022-04-27 NOTE — Progress Notes (Signed)
[address]       [place] [address]             [phone]     HPI: Roger [name] returns for a scheduled follow up visit to discuss the pathology results  Roger [name] is a 66 year old male with a history of tobacco abuse, COPD, hypertension, and anxiety.  He presented with a soft tissue mass in his upper abdomen lower chest.  Ultimately a PET/CT was done which showed the mass was hypermetabolic.  There are also bilateral lung masses, hilar lymph nodes, and rib lesions that were hypermetabolic as well.  I did a navigational bronchoscopy as well as a needle biopsy of the his abdominal wall mass on 04/20/2022.  Procedure was uncomplicated and he went home the same day.  He is feeling well.  No new issues since his procedure.  Past Medical History:  Diagnosis Date   Anxiety    Asthma    COPD (chronic obstructive pulmonary disease) (HCC)    Depression    Hemorrhoids    History of rectal bleeding    Hypertension    Palpitations    Pneumonia    in 2015   Post-operative nausea and vomiting    pt unsure    Current Outpatient Medications  Medication Sig Dispense Refill   albuterol (PROVENTIL HFA;VENTOLIN HFA) 108 (90 Base) MCG/ACT inhaler Inhale 2 puffs into the lungs every 6 (six) hours as needed for wheezing or shortness of breath.     ALPRAZolam (XANAX) 1 MG tablet Take 1 mg by mouth 5 (five) times daily.     aspirin EC 81 MG tablet Take 162 mg by mouth daily.      atenolol (TENORMIN) 50 MG tablet Take 50 mg by mouth daily.      atorvastatin (LIPITOR) 10 MG tablet Take 10 mg by mouth daily at 6 PM.      fluticasone (FLONASE) 50 MCG/ACT nasal spray Place 2 sprays into both nostrils daily.     ibuprofen (ADVIL,MOTRIN) 200 MG tablet Take 400 mg by mouth every 6 (six) hours as needed for headache, mild pain or moderate pain.      metFORMIN (GLUCOPHAGE-XR) 500 MG 24 hr tablet Take 500 mg by mouth daily with breakfast.     mirtazapine (REMERON) 15 MG tablet Take 15 mg  by mouth at bedtime.     No current facility-administered medications for this visit.    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  He underwent navigational bronchoscopy as well as a needle biopsy of the subcutaneous mass.  The subcutaneous mass in both lung lesions that were sampled were squamous cell carcinoma.  Consistent with stage IV lung cancer.  No issues related to the biopsies.  He has an appointment with oncology.  Plan: He has an appointment with Dr. Julien Nordmann on Tuesday, 05/03/2022  I will be happy to see Roger [name] back anytime in the future if I can be of any further assistance with his  care  [name], MD [facility] [phone]

## 2022-04-28 ENCOUNTER — Other Ambulatory Visit: Payer: Self-pay | Admitting: *Deleted

## 2022-04-28 NOTE — Progress Notes (Signed)
The proposed treatment discussed in conference is for discussion purpose only and is not a binding recommendation.  The patients have not been physically examined, or presented with their treatment options.  Therefore, final treatment plans cannot be decided.  

## 2022-05-03 ENCOUNTER — Other Ambulatory Visit: Payer: Self-pay

## 2022-05-03 ENCOUNTER — Telehealth: Payer: Self-pay | Admitting: Medical Oncology

## 2022-05-03 ENCOUNTER — Inpatient Hospital Stay: Payer: BC Managed Care – PPO

## 2022-05-03 ENCOUNTER — Encounter (HOSPITAL_COMMUNITY): Payer: Self-pay

## 2022-05-03 ENCOUNTER — Other Ambulatory Visit: Payer: Self-pay | Admitting: Medical Oncology

## 2022-05-03 ENCOUNTER — Encounter: Payer: Self-pay | Admitting: Internal Medicine

## 2022-05-03 ENCOUNTER — Inpatient Hospital Stay: Payer: BC Managed Care – PPO | Attending: Internal Medicine | Admitting: Internal Medicine

## 2022-05-03 ENCOUNTER — Telehealth: Payer: Self-pay | Admitting: Radiation Oncology

## 2022-05-03 VITALS — BP 142/77 | HR 78 | Resp 17 | Ht 71.0 in | Wt 158.0 lb

## 2022-05-03 DIAGNOSIS — C3481 Malignant neoplasm of overlapping sites of right bronchus and lung: Secondary | ICD-10-CM | POA: Insufficient documentation

## 2022-05-03 DIAGNOSIS — R918 Other nonspecific abnormal finding of lung field: Secondary | ICD-10-CM

## 2022-05-03 DIAGNOSIS — C3492 Malignant neoplasm of unspecified part of left bronchus or lung: Secondary | ICD-10-CM | POA: Diagnosis not present

## 2022-05-03 HISTORY — DX: Malignant neoplasm of unspecified part of left bronchus or lung: C34.92

## 2022-05-03 LAB — CMP (CANCER CENTER ONLY)
ALT: 9 U/L (ref 0–44)
AST: 10 U/L — ABNORMAL LOW (ref 15–41)
Albumin: 3.6 g/dL (ref 3.5–5.0)
Alkaline Phosphatase: 119 U/L (ref 38–126)
Anion gap: 7 (ref 5–15)
BUN: 9 mg/dL (ref 8–23)
CO2: 28 mmol/L (ref 22–32)
Calcium: 9.4 mg/dL (ref 8.9–10.3)
Chloride: 98 mmol/L (ref 98–111)
Creatinine: 0.75 mg/dL (ref 0.61–1.24)
GFR, Estimated: >60 mL/min (ref >60)
Glucose, Bld: 248 mg/dL — ABNORMAL HIGH (ref 70–99)
Potassium: 3.9 mmol/L (ref 3.5–5.1)
Sodium: 133 mmol/L — ABNORMAL LOW (ref 135–145)
Total Bilirubin: 0.4 mg/dL (ref 0.3–1.2)
Total Protein: 7.9 g/dL (ref 6.5–8.1)

## 2022-05-03 LAB — CBC WITH DIFFERENTIAL (CANCER CENTER ONLY)
Abs Immature Granulocytes: 0.05 10*3/uL (ref 0.00–0.07)
Basophils Absolute: 0.1 10*3/uL (ref 0.0–0.1)
Basophils Relative: 1 %
Eosinophils Absolute: 0.1 10*3/uL (ref 0.0–0.5)
Eosinophils Relative: 1 %
HCT: 36.8 % — ABNORMAL LOW (ref 39.0–52.0)
Hemoglobin: 12.1 g/dL — ABNORMAL LOW (ref 13.0–17.0)
Immature Granulocytes: 1 %
Lymphocytes Relative: 9 %
Lymphs Abs: 1 10*3/uL (ref 0.7–4.0)
MCH: 29.3 pg (ref 26.0–34.0)
MCHC: 32.9 g/dL (ref 30.0–36.0)
MCV: 89.1 fL (ref 80.0–100.0)
Monocytes Absolute: 0.7 10*3/uL (ref 0.1–1.0)
Monocytes Relative: 6 %
Neutro Abs: 9 10*3/uL — ABNORMAL HIGH (ref 1.7–7.7)
Neutrophils Relative %: 82 %
Platelet Count: 291 10*3/uL (ref 150–400)
RBC: 4.13 MIL/uL — ABNORMAL LOW (ref 4.22–5.81)
RDW: 12.5 % (ref 11.5–15.5)
WBC Count: 10.9 10*3/uL — ABNORMAL HIGH (ref 4.0–10.5)
nRBC: 0 % (ref 0.0–0.2)

## 2022-05-03 MED ORDER — LIDOCAINE-PRILOCAINE 2.5-2.5 % EX CREA: TOPICAL_CREAM | CUTANEOUS | 0 refills | Status: DC

## 2022-05-03 MED ORDER — PROCHLORPERAZINE MALEATE 10 MG PO TABS: 10.0000 mg | ORAL_TABLET | Freq: Four times a day (QID) | ORAL | 0 refills | Status: DC | PRN

## 2022-05-03 NOTE — Progress Notes (Signed)
START ON PATHWAY REGIMEN - Non-Small Cell Lung     A cycle is every 21 days:     Pembrolizumab      Paclitaxel      Carboplatin   **Always confirm dose/schedule in your pharmacy ordering system**  Patient Characteristics: Stage IV Metastatic, Squamous, Molecular Analysis Not Elected, PS = 0, 1, Initial Chemotherapy/Immunotherapy, Immunotherapy Candidate, PD-L1 Expression Positive 1-49% (TPS) / Negative / Not Tested / Awaiting Test Results Therapeutic Status: Stage IV Metastatic Histology: Squamous Cell ECOG Performance Status: 1 Chemotherapy/Immunotherapy Line of Therapy: Initial Chemotherapy/Immunotherapy Immunotherapy Candidate Status: Candidate for Immunotherapy PD-L1 Expression Status: PD-L1 Positive 1-49% (TPS) Intent of Therapy: Non-Curative / Palliative Intent, Discussed with Patient

## 2022-05-03 NOTE — Progress Notes (Signed)
During patient encounter with Dr. Julien Nordmann, patient was instructed to schedule a brain MRI as soon as he can for to check for any disease metastasis to the brain and was provided with the contact number for central scheduling. When I checked to see if the MRI had been scheduled yet, I noted that the order was made to [facility], not [facility]. I called [facility] to confirm the order for the MRI was still valid and [facility] confirmed that it was. I called the patient to inform him that the order for the MRI was for [facility] and not [facility]. Number for scheduling the MRI was provided to pt.

## 2022-05-03 NOTE — Telephone Encounter (Signed)
Pt called [facility] and he did not know which number to press for scheduling his MRI.  I told him to call back and select 1 then 3.

## 2022-05-03 NOTE — Telephone Encounter (Signed)
10/31 @ 1:49 pm Left voicemail for patient to call our office to be schedule for consult.

## 2022-05-03 NOTE — Progress Notes (Signed)
[name] Telephone:([phone]   [phone]  CONSULT NOTE  REFERRING PHYSICIAN: Dr. Modesto Charon  REASON FOR CONSULTATION:  66 years old white male recently diagnosed with lung cancer.  HPI Roger [name] is a 66 y.o. male with past medical history significant for COPD, hypertension, asthma, and anxiety, heart palpitation and long history of smoking.  The patient mentioned that around 3 months ago he felt some lump in the lower chest and left upper abdomen skin area.  He was seen by several provider in urgent care and was assured that this is lipoma.  The mass was getting bigger and he asked with his primary care provider to order an ultrasound for further evaluation of this lesion.  The ultrasound was performed with at [facility] facility and it showed irregular 2.6 cm mass at the site of the palpable mass of concern in the subcutaneous tissue concerning for malignancy.  The patient had CT scan of the chest, abdomen and pelvis performed on 03/08/2022 and showed finding concerning for malignancy throughout both lungs either representing metastatic disease or synchronous primary lung cancer.  This includes centrally necrotic mass in the left lower lobe measuring 6.6 x 6.6 x 4.5 cm.  The mass abuts the posterior pleural surface which is nodular and second.  Additionally the mass abuts and possibly invade the lower lobe segmental artery which is likely thrombosed and the right lower lobe segmental bronchus.  There was also centrally necrotic mass within the peripheral left upper lobe measuring 4.3 x 2.8 cm and the mass abuts the adjacent costal pleura.  Both of these masses result and traction and architectural distortion of the adjacent structure and elevation of the diaphragm.  There was also juxtapleural 0.8 cm solid nodule within the left lower lobe.  The scan also showed enlarged subcarinal and left hilar lymph nodes.  The subcarinal necrotic  appearing conglomerate measured 3.9 x 2.5 cm in the coronal plane with ill-defined left hilar soft tissue thickening.  There was a spiculated solid nodule within the right lower lobe measuring 1.9 x 1.8 cm.  The the scan also demonstrated the spiculated subcutaneous soft tissue nodule within the left ventral abdominal wall measuring 2.5 x 2.2 cm with overlying scale thickening and surrounding fat stranding.  There was subtle sclerotic subcentimeter lesion in the T10, T12 and L4 which could represent metastatic deposits. The patient had a PET scan on 03/24/2022 and that showed hypermetabolic nodules and masses in the lungs bilaterally with hypermetabolic mediastinal, and bihilar adenopathy.  There was also hypermetabolic osseous lesions and subcutaneous soft tissue mass along the ventral left upper quadrant indicative of a stage IV primary bronchogenic carcinoma.  There was small loculated left pleural effusion. The patient was referred to Dr. Roxan Hockey and on April 20, 2022 he underwent Tru-Cut needle biopsy of the left chest wall mass in addition to electromagnetic navigation bronchoscopy with brushing and transbronchial biopsies of the left upper lobe and left lower lobe masses. 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  PD-L1 expression was 1%. The patient had MRI of the brain ordered but not performed. Dr. Roxan Hockey kindly referred the patient to me today for evaluation and recommendation regarding treatment of his condition. When seen today he is feeling fine with no concerning complaints except for the soreness of the lumbar and the left  anterior abdominal wall.  He denied having any chest pain, shortness of breath, cough or hemoptysis.  He has no nausea, vomiting, diarrhea or constipation.  He has no headache or visual changes.  He  lost around 6 pounds in the last few months. Family history significant for mother with pulmonary fibrosis.  Father had small cell lung cancer.  Sister had non-small cell lung cancer and another sister had acute leukemia. The patient is single and has been married 5 times before.  He has 1 daughter.  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  He has a history of smoking more than 1 pack/day for around 41 years and trying to quit.  He has no history of alcohol or drug abuse.  HPI  Past Medical History:  Diagnosis Date   Anxiety    Asthma    COPD (chronic obstructive pulmonary disease) ([place])    Depression    Hemorrhoids    History of rectal bleeding    Hypertension    Palpitations    Pneumonia    in 2015   Post-operative nausea and vomiting    pt unsure    Past Surgical History:  Procedure Laterality Date   DENTAL SURGERY     MASS EXCISION Right 04/20/2022   Procedure: NEEDLE BIOPSY OF CHEST WALL MASS RIGHT LOWER CHEST WALL;  Surgeon: [name], MD;  Location: [facility] OR;  Service: Thoracic;  Laterality: Right;   NOSE SURGERY     SKIN CANCER EXCISION     left forehead   TONSILLECTOMY     VIDEO BRONCHOSCOPY WITH ENDOBRONCHIAL NAVIGATION N/A 04/20/2022   Procedure: VIDEO BRONCHOSCOPY WITH ENDOBRONCHIAL NAVIGATION;  Surgeon: [name], MD;  Location: [facility] OR;  Service: Thoracic;  Laterality: N/A;    Family History  Problem Relation Age of Onset   Anxiety disorder Mother    Depression Mother    Lung disease Mother        "arthritic lung"   Anxiety disorder Sister    Depression Sister    Anxiety disorder Brother    Lung disease Father    Lung cancer Father    Anxiety disorder Sister    Anxiety disorder Brother    Anxiety disorder Brother    Anxiety disorder Brother    Colon cancer Brother     Social History Social History   Tobacco Use   Smoking status: Former    Packs/day: 0.50    Years: 25.00    Total pack years: 12.50    Types: Cigarettes    Smokeless tobacco: Never  Vaping Use   Vaping Use: Never used  Substance Use Topics   Alcohol use: Never   Drug use: Never    Allergies  Allergen Reactions   Demerol Palpitations and Other (See Comments)    Heart beats fast   Erythromycin Nausea And Vomiting   Prednisone Other (See Comments)    "felt weird"   Sulfa Antibiotics Nausea And Vomiting and Other (See Comments)    hematoemesis   Augmentin [Amoxicillin-Pot Clavulanate] Nausea And Vomiting    Current Outpatient Medications  Medication Sig Dispense Refill   albuterol (PROVENTIL HFA;VENTOLIN HFA) 108 (90 Base) MCG/ACT inhaler Inhale 2 puffs into the lungs every 6 (six) hours as needed for wheezing or shortness of breath.     ALPRAZolam (XANAX) 1 MG tablet Take 1 mg by mouth 5 (five) times daily.     aspirin EC 81 MG tablet Take 162 mg by mouth daily.  atenolol (TENORMIN) 50 MG tablet Take 50 mg by mouth daily.      atorvastatin (LIPITOR) 10 MG tablet Take 10 mg by mouth daily at 6 PM.      fluticasone (FLONASE) 50 MCG/ACT nasal spray Place 2 sprays into both nostrils daily.     ibuprofen (ADVIL,MOTRIN) 200 MG tablet Take 400 mg by mouth every 6 (six) hours as needed for headache, mild pain or moderate pain.      metFORMIN (GLUCOPHAGE-XR) 500 MG 24 hr tablet Take 500 mg by mouth daily with breakfast.     mirtazapine (REMERON) 15 MG tablet Take 15 mg by mouth at bedtime.     No current facility-administered medications for this visit.    Review of Systems  Constitutional: positive for fatigue and weight loss Eyes: negative Ears, nose, mouth, throat, and face: negative Respiratory: negative Cardiovascular: negative Gastrointestinal: negative Genitourinary:negative Integument/breast: negative Hematologic/lymphatic: negative Musculoskeletal:negative Neurological: negative Behavioral/Psych: negative Endocrine: negative Allergic/Immunologic: negative  Physical Exam  MGQ:QPYPP, healthy, no distress, well  nourished, well developed, and anxious SKIN: skin color, texture, turgor are normal, no rashes or significant lesions HEAD: Normocephalic, No masses, lesions, tenderness or abnormalities EYES: normal, PERRLA, Conjunctiva are pink and non-injected EARS: External ears normal, Canals clear OROPHARYNX:no exudate, no erythema, and lips, buccal mucosa, and tongue normal  NECK: supple, no adenopathy, no JVD LYMPH:  no palpable lymphadenopathy, no hepatosplenomegaly LUNGS: clear to auscultation , and palpation HEART: regular rate & rhythm, no murmurs, and no gallops ABDOMEN: Palpable mass in the subcutaneous area of the left upper quadrant abdominal wall BACK: Back symmetric, no curvature., No CVA tenderness EXTREMITIES:no joint deformities, effusion, or inflammation, no edema  NEURO: alert & oriented x 3 with fluent speech, no focal motor/sensory deficits  PERFORMANCE STATUS: ECOG 1  LABORATORY DATA: Lab Results  Component Value Date   WBC 10.9 (H) 05/03/2022   HGB 12.1 (L) 05/03/2022   HCT 36.8 (L) 05/03/2022   MCV 89.1 05/03/2022   PLT 291 05/03/2022      Chemistry      Component Value Date/Time   NA 133 (L) 05/03/2022 1143   K 3.9 05/03/2022 1143   CL 98 05/03/2022 1143   CO2 28 05/03/2022 1143   BUN 9 05/03/2022 1143   CREATININE 0.75 05/03/2022 1143      Component Value Date/Time   CALCIUM 9.4 05/03/2022 1143   ALKPHOS 119 05/03/2022 1143   AST 10 (L) 05/03/2022 1143   ALT 9 05/03/2022 1143   BILITOT 0.4 05/03/2022 1143       RADIOGRAPHIC STUDIES: DG C-ARM BRONCHOSCOPY  Result Date: 04/20/2022 C-ARM BRONCHOSCOPY: Fluoroscopy was utilized by the requesting physician.  No radiographic interpretation.   DG Chest 2 View  Result Date: 04/19/2022 CLINICAL DATA:  Pre-op clearance exam for left lung mass. EXAM: CHEST - 2 VIEW COMPARISON:  CT on 04/15/2022 FINDINGS: The heart size and mediastinal contours are within normal limits. Chronic bibasilar predominant  interstitial fibrosis is again seen as well as emphysematous changes in upper lung zones. Increased confluent masslike opacity is seen in the central left lower lobe. Focal nodular density is also seen in the peripheral left mid lung which is become more dense since prior exam. No evidence of pleural effusion. IMPRESSION: Two masslike opacities in the left mid and lower lung, better visualized on recent chest CT. Chronic interstitial fibrosis and emphysema. Electronically Signed   By: Marlaine Hind M.D.   On: 04/19/2022 08:35   CT Super D Chest Wo Contrast  Result Date: 04/15/2022 CLINICAL DATA:  Pre-op lung mass. Multiple hypermetabolic pulmonary nodules, thoracic adenopathy and osseous lesions on PET-CT suspicious for bronchogenic carcinoma. EXAM: CT CHEST WITHOUT CONTRAST TECHNIQUE: Multidetector CT imaging of the chest was performed using thin slice collimation for electromagnetic bronchoscopy planning purposes, without intravenous contrast. RADIATION DOSE REDUCTION: This exam was performed according to the departmental dose-optimization program which includes automated exposure control, adjustment of the mA and/or kV according to patient size and/or use of iterative reconstruction technique. COMPARISON:  PET-CT 03/24/2022. Outside CT of the chest, abdomen and pelvis 03/08/2022 without report. FINDINGS: Cardiovascular: Atherosclerosis of the aorta, great vessels and coronary arteries. There is central enlargement of the pulmonary arteries consistent with pulmonary arterial hypertension. The heart size is normal. There is no pericardial effusion. Mediastinum/Nodes: There are multiple mildly enlarged mediastinal and hilar lymph nodes which were mildly hypermetabolic on PET-CT. These include a 9 mm subcarinal node on image 78/2 and a left hilar node measuring approximately 10 mm on image 71/2. This adenopathy appears grossly unchanged. The thyroid gland, trachea and esophagus demonstrate no significant  findings. Lungs/Pleura: No pleural effusion or pneumothorax. Dominant centrally in a chronic left lower lobe mass measures approximately 6.3 x 4.8 cm on image 76/8. Heterogeneous left upper lobe mass measures 5.7 x 3.8 cm on image 61/8. There is a spiculated nodule medially in the right lower lobe which measures up to 2.3 cm on image 100/8. These nodules are similar appearance to the recent prior studies and were hypermetabolic on PET-CT. Underlying severe centrilobular and paraseptal emphysema with scattered subpleural reticulation at both lung bases. Upper abdomen: The visualized upper abdomen appears stable, without significant findings. Musculoskeletal/Chest wall: An exophytic irregular soft tissue mass in the subcutaneous fat of the left upper abdomen has mildly enlarged from previous CT, measuring 3.8 x 2.8 cm on image 151/2. This was hypermetabolic on PET-CT and presumably reflects a soft tissue metastasis. Grossly stable lytic lesion involving the anterior aspect of the right 6th rib, hypermetabolic on PET-CT. IMPRESSION: 1. Imaging for bronchoscopy planning and guidance. 2. No significant change in multiple bilateral pulmonary nodules/masses, the largest in the left lower lobe, consistent with metastatic disease or synchronous primaries. 3. Multiple mildly enlarged mediastinal and hilar lymph nodes, hypermetabolic on PET-CT, suspicious for metastatic disease. 4. Enlarging exophytic soft tissue mass in the left upper abdominal wall, presumably a soft tissue metastasis. 5. Grossly stable lytic lesion involving the anterior aspect of the right 6th rib, hypermetabolic on PET-CT. 6. Central enlargement of the pulmonary arteries consistent with pulmonary arterial hypertension. 7. Aortic Atherosclerosis (ICD10-I70.0) and Emphysema (ICD10-J43.9). Electronically Signed   By: Richardean Sale M.D.   On: 04/15/2022 13:12    ASSESSMENT: This is a very pleasant 66 years old white male recently diagnosed with stage IV  (T3, N3, M1 C) non-small cell lung cancer, squamous cell carcinoma presented with bilateral pulmonary masses and nodules as well as mediastinal and bilateral hilar adenopathy in addition to subcutaneous metastatic disease and bone metastasis diagnosed in October 2023 with PD-L1 expression of 1%.   PLAN: I had a lengthy discussion with the patient and his sister today about his current disease stage, prognosis and treatment options.  I personally and independently reviewed the scan images and discussed the result with the patient and his family. I recommended for the patient to complete the staging work-up by ordering MRI of the brain but this was not scheduled.  We provided the patient with the contact number for central scheduling to arrange  for the MRI to be done soon. I discussed with the patient his treatment option and he understands that he has incurable condition and all the treatment will be of palliative nature. He was given the option of palliative care and hospice referral versus consideration of palliative systemic chemotherapy with carboplatin for AUC of 5, paclitaxel 175 Mg/M2 and Keytruda 200 Mg IV every 3 weeks with Neulasta support for 4 cycles followed by maintenance treatment with Keytruda 200 Mg IV every 3 weeks if the patient has no evidence for disease progression after cycle #4. He is interested in treatment. I discussed with him the adverse effect of this treatment including but not limited to alopecia, myelosuppression, nausea and vomiting, peripheral neuropathy, liver or renal dysfunction as well as the adverse effect of the immunotherapy. The patient is expected to start the first cycle of this treatment next week. He will have a chemotherapy education class before the first dose of his treatment. I will arrange for the patient to have a Port-A-Cath placed for IV access. I will call his pharmacy with prescription for Compazine 10 mg p.o. every 6 hours as needed for nausea in  addition to Emla cream to be applied to the Port-A-Cath site before treatment. The patient will come back for follow-up visit in 2 weeks for evaluation and management of any adverse effect of his treatment. For the smoking cessation I strongly encouraged the patient to quit smoking. He was advised to call immediately if he has any other concerning symptoms in the interval.  The patient voices understanding of current disease status and treatment options and is in agreement with the current care plan.  All questions were answered. The patient knows to call the clinic with any problems, questions or concerns. We can certainly see the patient much sooner if necessary.  Thank you so much for allowing me to participate in the care of Roger [name]. I will continue to follow up the patient with you and assist in his care.  The total time spent in the appointment was 90 minutes.  Disclaimer: This note was dictated with voice recognition software. Similar sounding words can inadvertently be transcribed and may not be corrected upon review.   Eilleen Kempf May 03, 2022, 12:35 PM

## 2022-05-04 ENCOUNTER — Other Ambulatory Visit: Payer: Self-pay

## 2022-05-04 NOTE — Progress Notes (Signed)
Radiation Oncology         [phone] ________________________________  Initial Outpatient Consultation  Name: Roger [name] MRN: [id number]  Date: 05/05/2022  DOB: 1956/06/22  SJ:GGEZMO, Baldemar Friday., PA-C  Curt Bears, MD   REFERRING PHYSICIAN: Curt Bears, MD  DIAGNOSIS: There were no encounter diagnoses.  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 male who is accompanied by ***. he is seen as a courtesy of Dr. Julien Nordmann for an opinion concerning radiation therapy as part of management for his recently diagnosed ***.   The patient presented with a palpable LUQ abdominal mass this past August. This prompted an abdominal ultrasound on 03/02/22 which revealed an irregular 2.6 cm mass at the site of palpable concern in  the subcutaneous tissues, concerning for malignancy.   CT of the chest abdomen and pelvis performed at [facility] on 03/08/22 for further evaluation further revealed: a left lower lobe mass measuring up 6.6 cm, abutting the pleural surface, and likely invading adjacent segmental bronchi and pulmonary arteries; a spiculated solid nodule within the right lower lobe measuring 1.9 x 1.8 cm; a centrally necrotic mass within the peripheral left upper lobe measuring 4.3 x 2.8 cm, abutting the adjacent costal pleura; a juxtapleural 8 mm solid nodule within the left lower lobe; and enlarged subcarinal and left hilar lymph nodes.   PET scan on 03/24/22 showed: hypermetabolic nodules and masses in the lungs  bilaterally (the largest of which measuring 5.7 x 6.8 cm and with an SUV max of 29.4); hypermetabolic mediastinal and bihilar adenopathy; hypermetabolic osseous lesions involving the spine, right anterior 6 th rib, and right iliac wing (measuring 2.0 cm with an SUV  max of 4.9); and a subcutaneous soft tissue mass along the ventral left upper quadrant measuring 2.9 cm with an SUV max of 15.5. Findings overall appeared to be indicative of stage IV primary bronchogenic carcinoma. PET also showed a small left pleural effusion, and an enlarged pulmonic trunk indicative of pulmonary arterial hypertension.   During a follow-up visit with his PCP on 04/06/22, the palpable LUQ mass was appreciated to be red, swollen, and tender.   The patient was promptly referred to Dr. Roxan Hockey on 04/12/22 for further evaluation and management. During this visit, the patient denied any respiratory symptoms but reported a poor appetite with an associated 7 pounds weight loss over the past 6 months. For treatment planning, Dr. Roxan Hockey recommended proceeding with tissue sampling of the lung masses and a needle core biopsy of the left costal margin soft tissue mass.   (Pre-op super D chest CT without contrast on 04/15/22 showed/re-demonstrated: no significant interval change of the multiple bilateral pulmonary nodules/masses; multiple mildly enlarged mediastinal and hilar lymph nodes which appeared to be hypermetabolic on PET; the enlarging exophytic soft tissue mass in the left upper abdominal wall (presumed to be a soft tissue metastasis); and stability of the hypermetabolic lytic lesion involving the anterior aspect of the right 6th rib).     The patient opted to proceed with bronchoscopy and biopsies on  04/20/22 under the care of Dr. Roxan Hockey. Biopsies collected from the left upper lobe, left lower lobe, and chest wall mass all showed invasive well-differentiated keratinizing squamous cell carcinoma. PD-L1 testing  performed showed a TPS score of 1%.  Accordingly, the patient was referred to Dr. Julien Nordmann on 05/03/22 to discuss treatment options. During this visit, the aptietn denied any concerns except for soreness to his lumber region and left anterior abdominal wall. He again denied any respiratory symptoms. In terms of treatment options, Dr. Julien Nordmann discussed the option of palliative care and hospice referral versus consideration of palliative systemic chemotherapy consisting of carboplatin, paclitaxel and Keytruda every 3 weeks with Neulasta support for 4 cycles, followed by maintenance treatment with Keytruda every 3 weeks if he has no evidence of disease progression after cycle #4. Following a thorough discussion with Dr. Julien Nordmann, the patient would like to proceed with treatment. He is aware that he has an incurable condition and that all treatment will be palliative in nature.   He is scheduled for an MRI of the brain on 05/19/22.   Smoking history: 1 pack/day for around 71 years and is trying to quit Family history:  father had small cell lung cancer, sister had non-small cell lung cancer, and another sister had acute leukemia.  PREVIOUS RADIATION THERAPY: No  PAST MEDICAL HISTORY:  Past Medical History:  Diagnosis Date   Anxiety    Asthma    COPD (chronic obstructive pulmonary disease) ([place])    Depression    Hemorrhoids    History of rectal bleeding    Hypertension    Palpitations    Pneumonia    in 2015   Post-operative nausea and vomiting    pt unsure   Primary squamous cell carcinoma of left lung ([place]) 05/03/2022    PAST SURGICAL HISTORY: Past Surgical History:  Procedure Laterality Date   DENTAL SURGERY     MASS EXCISION Right 04/20/2022   Procedure: NEEDLE BIOPSY OF CHEST WALL MASS RIGHT LOWER CHEST WALL;  Surgeon: [name], MD;  Location: [facility] OR;  Service: Thoracic;  Laterality: Right;   NOSE SURGERY     SKIN CANCER EXCISION     left forehead   TONSILLECTOMY      VIDEO BRONCHOSCOPY WITH ENDOBRONCHIAL NAVIGATION N/A 04/20/2022   Procedure: VIDEO BRONCHOSCOPY WITH ENDOBRONCHIAL NAVIGATION;  Surgeon: [name], MD;  Location: [facility] OR;  Service: Thoracic;  Laterality: N/A;    FAMILY HISTORY:  Family History  Problem Relation Age of Onset   Anxiety disorder Mother    Depression Mother    Lung disease Mother        "arthritic lung"   Anxiety disorder Sister    Depression Sister    Anxiety disorder Brother    Lung disease Father    Lung cancer Father    Anxiety disorder Sister    Anxiety disorder Brother    Anxiety disorder Brother    Anxiety disorder Brother    Colon cancer Brother     SOCIAL HISTORY:  Social History   Tobacco Use   Smoking status: Former    Packs/day: 0.50    Years: 25.00    Total pack years: 12.50    Types: Cigarettes   Smokeless tobacco: Never  Vaping Use   Vaping Use: Never used  Substance Use Topics   Alcohol use: Never   Drug use: Never    ALLERGIES:  Allergies  Allergen Reactions   Demerol Palpitations and Other (See Comments)  Heart beats fast   Erythromycin Nausea And Vomiting   Prednisone Other (See Comments)    "felt weird"   Sulfa Antibiotics Nausea And Vomiting and Other (See Comments)    hematoemesis   Augmentin [Amoxicillin-Pot Clavulanate] Nausea And Vomiting    MEDICATIONS:  Current Outpatient Medications  Medication Sig Dispense Refill   albuterol (PROVENTIL HFA;VENTOLIN HFA) 108 (90 Base) MCG/ACT inhaler Inhale 2 puffs into the lungs every 6 (six) hours as needed for wheezing or shortness of breath.     ALPRAZolam (XANAX) 1 MG tablet Take 1 mg by mouth 5 (five) times daily.     aspirin EC 81 MG tablet Take 162 mg by mouth daily.      atenolol (TENORMIN) 50 MG tablet Take 50 mg by mouth daily.      atorvastatin (LIPITOR) 10 MG tablet Take 10 mg by mouth daily at 6 PM.      fluticasone (FLONASE) 50 MCG/ACT nasal spray Place 2 sprays into both nostrils daily.     ibuprofen  (ADVIL,MOTRIN) 200 MG tablet Take 400 mg by mouth every 6 (six) hours as needed for headache, mild pain or moderate pain.      lidocaine-prilocaine (EMLA) cream Apply to the Port-A-Cath site 30-60 minutes before treatment. 30 g 0   metFORMIN (GLUCOPHAGE-XR) 500 MG 24 hr tablet Take 500 mg by mouth daily with breakfast.     mirtazapine (REMERON) 15 MG tablet Take 15 mg by mouth at bedtime.     prochlorperazine (COMPAZINE) 10 MG tablet Take 1 tablet (10 mg total) by mouth every 6 (six) hours as needed for nausea or vomiting. 30 tablet 0   No current facility-administered medications for this encounter.    REVIEW OF SYSTEMS:  A 10+ POINT REVIEW OF SYSTEMS WAS OBTAINED including neurology, dermatology, psychiatry, cardiac, respiratory, lymph, extremities, GI, GU, musculoskeletal, constitutional, reproductive, HEENT. ***   PHYSICAL EXAM:  vitals were not taken for this visit.   General: Alert and oriented, in no acute distress HEENT: Head is normocephalic. Extraocular movements are intact. Oropharynx is clear. Neck: Neck is supple, no palpable cervical or supraclavicular lymphadenopathy. Heart: Regular in rate and rhythm with no murmurs, rubs, or gallops. Chest: Clear to auscultation bilaterally, with no rhonchi, wheezes, or rales. Abdomen: Soft, nontender, nondistended, with no rigidity or guarding. Extremities: No cyanosis or edema. Lymphatics: see Neck Exam Skin: No concerning lesions. Musculoskeletal: symmetric strength and muscle tone throughout. Neurologic: Cranial nerves II through XII are grossly intact. No obvious focalities. Speech is fluent. Coordination is intact. Psychiatric: Judgment and insight are intact. Affect is appropriate. ***  ECOG = ***  0 - Asymptomatic (Fully active, able to carry on all predisease activities without restriction)  1 - Symptomatic but completely ambulatory (Restricted in physically strenuous activity but ambulatory and able to carry out work of a  light or sedentary nature. For example, light housework, office work)  2 - Symptomatic, <50% in bed during the day (Ambulatory and capable of all self care but unable to carry out any work activities. Up and about more than 50% of waking hours)  3 - Symptomatic, >50% in bed, but not bedbound (Capable of only limited self-care, confined to bed or chair 50% or more of waking hours)  4 - Bedbound (Completely disabled. Cannot carry on any self-care. Totally confined to bed or chair)  5 - Death   Eustace Pen MM, Creech RH, Tormey DC, et al. [phone]). "Toxicity and response criteria of the [facility]". Am. Lenna Sciara.  Clin. Oncol. 5 (6): 649-55  LABORATORY DATA:  Lab Results  Component Value Date   WBC 10.9 (H) 05/03/2022   HGB 12.1 (L) 05/03/2022   HCT 36.8 (L) 05/03/2022   MCV 89.1 05/03/2022   PLT 291 05/03/2022   NEUTROABS 9.0 (H) 05/03/2022   Lab Results  Component Value Date   NA 133 (L) 05/03/2022   K 3.9 05/03/2022   CL 98 05/03/2022   CO2 28 05/03/2022   GLUCOSE 248 (H) 05/03/2022   BUN 9 05/03/2022   CREATININE 0.75 05/03/2022   CALCIUM 9.4 05/03/2022      RADIOGRAPHY: DG C-ARM BRONCHOSCOPY  Result Date: 04/20/2022 C-ARM BRONCHOSCOPY: Fluoroscopy was utilized by the requesting physician.  No radiographic interpretation.   DG Chest 2 View  Result Date: 04/19/2022 CLINICAL DATA:  Pre-op clearance exam for left lung mass. EXAM: CHEST - 2 VIEW COMPARISON:  CT on 04/15/2022 FINDINGS: The heart size and mediastinal contours are within normal limits. Chronic bibasilar predominant interstitial fibrosis is again seen as well as emphysematous changes in upper lung zones. Increased confluent masslike opacity is seen in the central left lower lobe. Focal nodular density is also seen in the peripheral left mid lung which is become more dense since prior exam. No evidence of pleural effusion. IMPRESSION: Two masslike opacities in the left mid and lower lung, better visualized  on recent chest CT. Chronic interstitial fibrosis and emphysema. Electronically Signed   By: Marlaine Hind M.D.   On: 04/19/2022 08:35   CT Super D Chest Wo Contrast  Result Date: 04/15/2022 CLINICAL DATA:  Pre-op lung mass. Multiple hypermetabolic pulmonary nodules, thoracic adenopathy and osseous lesions on PET-CT suspicious for bronchogenic carcinoma. EXAM: CT CHEST WITHOUT CONTRAST TECHNIQUE: Multidetector CT imaging of the chest was performed using thin slice collimation for electromagnetic bronchoscopy planning purposes, without intravenous contrast. RADIATION DOSE REDUCTION: This exam was performed according to the departmental dose-optimization program which includes automated exposure control, adjustment of the mA and/or kV according to patient size and/or use of iterative reconstruction technique. COMPARISON:  PET-CT 03/24/2022. Outside CT of the chest, abdomen and pelvis 03/08/2022 without report. FINDINGS: Cardiovascular: Atherosclerosis of the aorta, great vessels and coronary arteries. There is central enlargement of the pulmonary arteries consistent with pulmonary arterial hypertension. The heart size is normal. There is no pericardial effusion. Mediastinum/Nodes: There are multiple mildly enlarged mediastinal and hilar lymph nodes which were mildly hypermetabolic on PET-CT. These include a 9 mm subcarinal node on image 78/2 and a left hilar node measuring approximately 10 mm on image 71/2. This adenopathy appears grossly unchanged. The thyroid gland, trachea and esophagus demonstrate no significant findings. Lungs/Pleura: No pleural effusion or pneumothorax. Dominant centrally in a chronic left lower lobe mass measures approximately 6.3 x 4.8 cm on image 76/8. Heterogeneous left upper lobe mass measures 5.7 x 3.8 cm on image 61/8. There is a spiculated nodule medially in the right lower lobe which measures up to 2.3 cm on image 100/8. These nodules are similar appearance to the recent prior  studies and were hypermetabolic on PET-CT. Underlying severe centrilobular and paraseptal emphysema with scattered subpleural reticulation at both lung bases. Upper abdomen: The visualized upper abdomen appears stable, without significant findings. Musculoskeletal/Chest wall: An exophytic irregular soft tissue mass in the subcutaneous fat of the left upper abdomen has mildly enlarged from previous CT, measuring 3.8 x 2.8 cm on image 151/2. This was hypermetabolic on PET-CT and presumably reflects a soft tissue metastasis. Grossly stable lytic lesion involving the  anterior aspect of the right 6th rib, hypermetabolic on PET-CT. IMPRESSION: 1. Imaging for bronchoscopy planning and guidance. 2. No significant change in multiple bilateral pulmonary nodules/masses, the largest in the left lower lobe, consistent with metastatic disease or synchronous primaries. 3. Multiple mildly enlarged mediastinal and hilar lymph nodes, hypermetabolic on PET-CT, suspicious for metastatic disease. 4. Enlarging exophytic soft tissue mass in the left upper abdominal wall, presumably a soft tissue metastasis. 5. Grossly stable lytic lesion involving the anterior aspect of the right 6th rib, hypermetabolic on PET-CT. 6. Central enlargement of the pulmonary arteries consistent with pulmonary arterial hypertension. 7. Aortic Atherosclerosis (ICD10-I70.0) and Emphysema (ICD10-J43.9). Electronically Signed   By: Richardean Sale M.D.   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  We discussed the natural history of *** and general treatment, highlighting the role of radiotherapy in the  management.  We discussed the available radiation techniques, and focused on the details of logistics and delivery.  We reviewed the anticipated acute and late sequelae associated with radiation in this setting.  The patient was encouraged to ask questions that I answered to the best of my ability. *** A patient consent form was discussed and signed.  We retained a copy for our records.  The patient would like to proceed with radiation and will be scheduled for CT simulation.  PLAN: ***    *** minutes of total time was spent for this patient encounter, including preparation, face-to-face counseling with the patient and coordination of care, physical exam, and documentation of the encounter.   ------------------------------------------------  Blair Promise, PhD, MD  This document serves as a record of services personally performed by Gery Pray, MD. It was created on his behalf by Roney Mans, a trained medical scribe. The creation of this record is based on the scribe's personal observations and the provider's statements to them. This document has been checked and approved by the attending provider.

## 2022-05-04 NOTE — Progress Notes (Incomplete)
Thoracic Location of Tumor / Histology: stage IV (T3, N3, M1 C) non-small cell lung cancer, squamous cell carcinoma   Patient presented with symptoms of: he felt some lumps in the lower chest and left upper abdomen area.   Biopsies revealed:   04/20/2022 FINAL MICROSCOPIC DIAGNOSIS:   A. LUNG, LEFT UPPER LOBE, BRUSH:  - Malignant cells consistent with squamous cell carcinoma, see comment   B. LUNG, LEFT UPPER LOBE, FINE NEEDLE ASPIRATION:  - Malignant cells consistent with squamous cell carcinoma   C. LUNG, LEFT LOWER LOBE, BRUSH:  - Malignant cells consistent with squamous cell carcinoma   FINAL MICROSCOPIC DIAGNOSIS:   A. SOFT TISSUE, CHEST WALL, BIOPSY:  - Invasive well-differentiated keratinizing squamous cell carcinoma   B. SOFT TISSUE, CHEST WALL, BIOPSY:  - Invasive well-differentiated keratinizing squamous cell carcinoma   C. LUNG, LEFT UPPER LOBE, BIOPSY:  - Invasive well-differentiated keratinizing squamous cell carcinoma   D. LUNG, LEFT LOWER LOBE, BIOPSY:  - Invasive well-differentiated keratinizing squamous cell carcinoma   Tobacco/Marijuana/Snuff/ETOH use: history of smoking more than 1 pack/day for around 41 years.  Denies marijuana, snuff, ETOH use.  Past/Anticipated interventions by cardiothoracic surgery, if any: Tru-Cut needle biopsy of left chest wall mass and electromagnetic navigational bronchoscopy with brushings and transbronchial biopsies by Dr. Roxan Hockey on 04/20/2022.  Past/Anticipated interventions by medical oncology, if any: palliative systemic chemotherapy with carboplatin for AUC of 5, paclitaxel 175 Mg/M2 and Keytruda 200 Mg IV every 3 weeks with Neulasta support for 4 cycles followed by maintenance treatment with Keytruda 200 Mg IV every 3 weeks. Chemo to start 05/10/2022.  Signs/Symptoms Weight changes, if any: {:18581} Respiratory complaints, if any: {:18581} Hemoptysis, if any: {:18581} Pain issues, if any:  {:18581}  SAFETY  ISSUES: Prior radiation? {:18581} Pacemaker/ICD? {:18581}  Possible current pregnancy?no Is the patient on methotrexate? {:18581}  Current Complaints / other details:  ***

## 2022-05-05 ENCOUNTER — Other Ambulatory Visit: Payer: Self-pay

## 2022-05-05 ENCOUNTER — Encounter: Payer: Self-pay | Admitting: Radiation Oncology

## 2022-05-05 ENCOUNTER — Ambulatory Visit
Admission: RE | Admit: 2022-05-05 | Discharge: 2022-05-05 | Disposition: A | Discharge status: Home / Self Care | Payer: BC Managed Care – PPO | Source: Ambulatory Visit | Attending: Radiation Oncology | Admitting: Radiation Oncology

## 2022-05-05 VITALS — BP 117/72 | HR 68 | Temp 97.9°F | Resp 20 | Ht 71.0 in | Wt 159.4 lb

## 2022-05-05 DIAGNOSIS — C7951 Secondary malignant neoplasm of bone: Secondary | ICD-10-CM | POA: Diagnosis not present

## 2022-05-05 DIAGNOSIS — Z806 Family history of leukemia: Secondary | ICD-10-CM | POA: Diagnosis not present

## 2022-05-05 DIAGNOSIS — Z800 Family history of malignant neoplasm of digestive organs: Secondary | ICD-10-CM | POA: Insufficient documentation

## 2022-05-05 DIAGNOSIS — Z87891 Personal history of nicotine dependence: Secondary | ICD-10-CM | POA: Insufficient documentation

## 2022-05-05 DIAGNOSIS — I10 Essential (primary) hypertension: Secondary | ICD-10-CM | POA: Diagnosis not present

## 2022-05-05 DIAGNOSIS — J432 Centrilobular emphysema: Secondary | ICD-10-CM | POA: Diagnosis not present

## 2022-05-05 DIAGNOSIS — Z7982 Long term (current) use of aspirin: Secondary | ICD-10-CM | POA: Insufficient documentation

## 2022-05-05 DIAGNOSIS — C3432 Malignant neoplasm of lower lobe, left bronchus or lung: Secondary | ICD-10-CM | POA: Diagnosis present

## 2022-05-05 DIAGNOSIS — I700 Atherosclerosis of aorta: Secondary | ICD-10-CM | POA: Diagnosis not present

## 2022-05-05 DIAGNOSIS — C3492 Malignant neoplasm of unspecified part of left bronchus or lung: Secondary | ICD-10-CM

## 2022-05-05 DIAGNOSIS — F17210 Nicotine dependence, cigarettes, uncomplicated: Secondary | ICD-10-CM | POA: Diagnosis not present

## 2022-05-05 DIAGNOSIS — R634 Abnormal weight loss: Secondary | ICD-10-CM | POA: Diagnosis not present

## 2022-05-05 DIAGNOSIS — J90 Pleural effusion, not elsewhere classified: Secondary | ICD-10-CM | POA: Diagnosis not present

## 2022-05-05 DIAGNOSIS — R630 Anorexia: Secondary | ICD-10-CM | POA: Insufficient documentation

## 2022-05-05 DIAGNOSIS — Z79899 Other long term (current) drug therapy: Secondary | ICD-10-CM | POA: Diagnosis not present

## 2022-05-05 DIAGNOSIS — Z801 Family history of malignant neoplasm of trachea, bronchus and lung: Secondary | ICD-10-CM | POA: Insufficient documentation

## 2022-05-05 DIAGNOSIS — R222 Localized swelling, mass and lump, trunk: Secondary | ICD-10-CM | POA: Diagnosis not present

## 2022-05-05 DIAGNOSIS — Z7984 Long term (current) use of oral hypoglycemic drugs: Secondary | ICD-10-CM | POA: Diagnosis not present

## 2022-05-06 ENCOUNTER — Telehealth: Payer: Self-pay | Admitting: Physician Assistant

## 2022-05-06 NOTE — Telephone Encounter (Signed)
Scheduled per 10/31 los, patent has been called and voicemail was left regarding upcoming appointments.

## 2022-05-07 ENCOUNTER — Other Ambulatory Visit: Payer: Self-pay

## 2022-05-09 ENCOUNTER — Encounter: Payer: Self-pay | Admitting: Internal Medicine

## 2022-05-09 ENCOUNTER — Encounter (HOSPITAL_COMMUNITY): Payer: Self-pay | Admitting: Internal Medicine

## 2022-05-09 ENCOUNTER — Other Ambulatory Visit: Payer: Self-pay

## 2022-05-09 ENCOUNTER — Telehealth: Payer: Self-pay | Admitting: Medical Oncology

## 2022-05-09 ENCOUNTER — Inpatient Hospital Stay: Payer: BC Managed Care – PPO | Attending: Internal Medicine

## 2022-05-09 DIAGNOSIS — M7989 Other specified soft tissue disorders: Secondary | ICD-10-CM | POA: Insufficient documentation

## 2022-05-09 DIAGNOSIS — R110 Nausea: Secondary | ICD-10-CM | POA: Insufficient documentation

## 2022-05-09 DIAGNOSIS — Z79899 Other long term (current) drug therapy: Secondary | ICD-10-CM | POA: Insufficient documentation

## 2022-05-09 DIAGNOSIS — Z5111 Encounter for antineoplastic chemotherapy: Secondary | ICD-10-CM | POA: Insufficient documentation

## 2022-05-09 DIAGNOSIS — R748 Abnormal levels of other serum enzymes: Secondary | ICD-10-CM | POA: Insufficient documentation

## 2022-05-09 DIAGNOSIS — Z5189 Encounter for other specified aftercare: Secondary | ICD-10-CM | POA: Insufficient documentation

## 2022-05-09 DIAGNOSIS — Z7901 Long term (current) use of anticoagulants: Secondary | ICD-10-CM | POA: Insufficient documentation

## 2022-05-09 DIAGNOSIS — Z5112 Encounter for antineoplastic immunotherapy: Secondary | ICD-10-CM | POA: Insufficient documentation

## 2022-05-09 DIAGNOSIS — C3481 Malignant neoplasm of overlapping sites of right bronchus and lung: Secondary | ICD-10-CM | POA: Insufficient documentation

## 2022-05-09 MED FILL — Dexamethasone Sodium Phosphate Inj 100 MG/10ML: INTRAMUSCULAR | Qty: 1 | Status: AC

## 2022-05-09 MED FILL — Fosaprepitant Dimeglumine For IV Infusion 150 MG (Base Eq): INTRAVENOUS | Qty: 5 | Status: AC

## 2022-05-09 NOTE — Telephone Encounter (Signed)
Roger [name] asking if [name]  has 6 months or less with or  without tx?    I explained palliative chemotherapy goal.

## 2022-05-09 NOTE — Progress Notes (Signed)
Called pt to introduce myself as his Arboriculturist and to discuss the [employer].  I left a msg requesting he return my call if he's interested in applying for the grant.

## 2022-05-10 ENCOUNTER — Other Ambulatory Visit: Payer: Self-pay

## 2022-05-10 ENCOUNTER — Inpatient Hospital Stay: Payer: BC Managed Care – PPO

## 2022-05-10 ENCOUNTER — Encounter: Payer: Self-pay | Admitting: Internal Medicine

## 2022-05-10 VITALS — BP 109/62 | HR 56 | Temp 97.3°F | Resp 18

## 2022-05-10 DIAGNOSIS — C3481 Malignant neoplasm of overlapping sites of right bronchus and lung: Secondary | ICD-10-CM | POA: Diagnosis not present

## 2022-05-10 DIAGNOSIS — Z79899 Other long term (current) drug therapy: Secondary | ICD-10-CM | POA: Diagnosis not present

## 2022-05-10 DIAGNOSIS — C3492 Malignant neoplasm of unspecified part of left bronchus or lung: Secondary | ICD-10-CM

## 2022-05-10 DIAGNOSIS — Z5111 Encounter for antineoplastic chemotherapy: Secondary | ICD-10-CM | POA: Diagnosis not present

## 2022-05-10 DIAGNOSIS — M7989 Other specified soft tissue disorders: Secondary | ICD-10-CM | POA: Diagnosis not present

## 2022-05-10 DIAGNOSIS — Z5112 Encounter for antineoplastic immunotherapy: Secondary | ICD-10-CM | POA: Diagnosis not present

## 2022-05-10 DIAGNOSIS — Z7901 Long term (current) use of anticoagulants: Secondary | ICD-10-CM | POA: Diagnosis not present

## 2022-05-10 DIAGNOSIS — Z5189 Encounter for other specified aftercare: Secondary | ICD-10-CM | POA: Diagnosis not present

## 2022-05-10 DIAGNOSIS — R748 Abnormal levels of other serum enzymes: Secondary | ICD-10-CM | POA: Diagnosis not present

## 2022-05-10 DIAGNOSIS — R110 Nausea: Secondary | ICD-10-CM | POA: Diagnosis not present

## 2022-05-10 LAB — CBC WITH DIFFERENTIAL (CANCER CENTER ONLY)
Abs Immature Granulocytes: 0.03 10*3/uL (ref 0.00–0.07)
Basophils Absolute: 0.1 10*3/uL (ref 0.0–0.1)
Basophils Relative: 1 %
Eosinophils Absolute: 0.1 10*3/uL (ref 0.0–0.5)
Eosinophils Relative: 1 %
HCT: 36.2 % — ABNORMAL LOW (ref 39.0–52.0)
Hemoglobin: 11.6 g/dL — ABNORMAL LOW (ref 13.0–17.0)
Immature Granulocytes: 0 %
Lymphocytes Relative: 12 %
Lymphs Abs: 1 10*3/uL (ref 0.7–4.0)
MCH: 29.2 pg (ref 26.0–34.0)
MCHC: 32 g/dL (ref 30.0–36.0)
MCV: 91.2 fL (ref 80.0–100.0)
Monocytes Absolute: 0.6 10*3/uL (ref 0.1–1.0)
Monocytes Relative: 6 %
Neutro Abs: 6.8 10*3/uL (ref 1.7–7.7)
Neutrophils Relative %: 80 %
Platelet Count: 269 10*3/uL (ref 150–400)
RBC: 3.97 MIL/uL — ABNORMAL LOW (ref 4.22–5.81)
RDW: 12.7 % (ref 11.5–15.5)
WBC Count: 8.6 10*3/uL (ref 4.0–10.5)
nRBC: 0 % (ref 0.0–0.2)

## 2022-05-10 LAB — CMP (CANCER CENTER ONLY)
ALT: 11 U/L (ref 0–44)
AST: 12 U/L — ABNORMAL LOW (ref 15–41)
Albumin: 3.4 g/dL — ABNORMAL LOW (ref 3.5–5.0)
Alkaline Phosphatase: 113 U/L (ref 38–126)
Anion gap: 8 (ref 5–15)
BUN: 8 mg/dL (ref 8–23)
CO2: 26 mmol/L (ref 22–32)
Calcium: 9.2 mg/dL (ref 8.9–10.3)
Chloride: 100 mmol/L (ref 98–111)
Creatinine: 0.74 mg/dL (ref 0.61–1.24)
GFR, Estimated: >60 mL/min (ref >60)
Glucose, Bld: 338 mg/dL — ABNORMAL HIGH (ref 70–99)
Potassium: 4.1 mmol/L (ref 3.5–5.1)
Sodium: 134 mmol/L — ABNORMAL LOW (ref 135–145)
Total Bilirubin: 0.3 mg/dL (ref 0.3–1.2)
Total Protein: 7.4 g/dL (ref 6.5–8.1)

## 2022-05-10 LAB — TSH: TSH: 2.91 u[IU]/mL (ref 0.350–4.500)

## 2022-05-10 MED ORDER — DIPHENHYDRAMINE HCL 50 MG/ML IJ SOLN: 50.0000 mg | Freq: Once | INTRAMUSCULAR | Status: DC

## 2022-05-10 MED ORDER — SODIUM CHLORIDE 0.9 % IV SOLN
10.0000 mg | Freq: Once | INTRAVENOUS | Status: AC
  Administered 2022-05-10: 10 mg via INTRAVENOUS
  Filled 2022-05-10: qty 10

## 2022-05-10 MED ORDER — SODIUM CHLORIDE 0.9 % IV SOLN
200.0000 mg | Freq: Once | INTRAVENOUS | Status: AC
  Administered 2022-05-10: 200 mg via INTRAVENOUS
  Filled 2022-05-10: qty 200

## 2022-05-10 MED ORDER — FAMOTIDINE IN NACL 20-0.9 MG/50ML-% IV SOLN
20.0000 mg | Freq: Once | INTRAVENOUS | Status: AC
  Administered 2022-05-10: 20 mg via INTRAVENOUS
  Filled 2022-05-10: qty 50

## 2022-05-10 MED ORDER — PALONOSETRON HCL INJECTION 0.25 MG/5ML
0.2500 mg | Freq: Once | INTRAVENOUS | Status: AC
  Administered 2022-05-10: 0.25 mg via INTRAVENOUS
  Filled 2022-05-10: qty 5

## 2022-05-10 MED ORDER — SODIUM CHLORIDE 0.9 % IV SOLN
175.0000 mg/m2 | Freq: Once | INTRAVENOUS | Status: AC
  Administered 2022-05-10: 330 mg via INTRAVENOUS
  Filled 2022-05-10: qty 55

## 2022-05-10 MED ORDER — SODIUM CHLORIDE 0.9 % IV SOLN: Freq: Once | INTRAVENOUS | Status: AC

## 2022-05-10 MED ORDER — SODIUM CHLORIDE 0.9 % IV SOLN
598.0000 mg | Freq: Once | INTRAVENOUS | Status: AC
  Administered 2022-05-10: 600 mg via INTRAVENOUS
  Filled 2022-05-10: qty 60

## 2022-05-10 MED ORDER — SODIUM CHLORIDE 0.9 % IV SOLN
150.0000 mg | Freq: Once | INTRAVENOUS | Status: AC
  Administered 2022-05-10: 150 mg via INTRAVENOUS
  Filled 2022-05-10: qty 150

## 2022-05-10 MED ORDER — CETIRIZINE HCL 10 MG/ML IV SOLN
10.0000 mg | Freq: Once | INTRAVENOUS | Status: AC
  Administered 2022-05-10: 10 mg via INTRAVENOUS
  Filled 2022-05-10: qty 1

## 2022-05-10 NOTE — Patient Instructions (Signed)
[facility]  Discharge Instructions: Thank you for choosing [facility] to provide your oncology and hematology care.   If you have a lab appointment with the [facility], please go directly to the [facility] and check in at the registration area.   Wear comfortable clothing and clothing appropriate for easy access to any Portacath or PICC line.   We strive to give you quality time with your provider. You may need to reschedule your appointment if you arrive late (15 or more minutes).  Arriving late affects you and other patients whose appointments are after yours.  Also, if you miss three or more appointments without notifying the office, you may be dismissed from the clinic at the provider's discretion.      For prescription refill requests, have your pharmacy contact our office and allow 72 hours for refills to be completed.    Today you received the following chemotherapy and/or immunotherapy agents Keytruda, Taxol, Carbo      To help prevent nausea and vomiting after your treatment, we encourage you to take your nausea medication as directed.  BELOW ARE SYMPTOMS THAT SHOULD BE REPORTED IMMEDIATELY: *FEVER GREATER THAN 100.4 F (38 C) OR HIGHER *CHILLS OR SWEATING *NAUSEA AND VOMITING THAT IS NOT CONTROLLED WITH YOUR NAUSEA MEDICATION *UNUSUAL SHORTNESS OF BREATH *UNUSUAL BRUISING OR BLEEDING *URINARY PROBLEMS (pain or burning when urinating, or frequent urination) *BOWEL PROBLEMS (unusual diarrhea, constipation, pain near the anus) TENDERNESS IN MOUTH AND THROAT WITH OR WITHOUT PRESENCE OF ULCERS (sore throat, sores in mouth, or a toothache) UNUSUAL RASH, SWELLING OR PAIN  UNUSUAL VAGINAL DISCHARGE OR ITCHING   Items with * indicate a potential emergency and should be followed up as soon as possible or go to the Emergency Department if any problems should occur.  Please show the CHEMOTHERAPY ALERT CARD or IMMUNOTHERAPY ALERT CARD at  check-in to the Emergency Department and triage nurse.  Should you have questions after your visit or need to cancel or reschedule your appointment, please contact [facility]  Dept: [phone]  and follow the prompts.  Office hours are 8:00 a.m. to 4:30 p.m. Monday - Friday. Please note that voicemails left after 4:00 p.m. may not be returned until the following business day.  We are closed weekends and major holidays. You have access to a nurse at all times for urgent questions. Please call the main number to the clinic Dept: [phone] and follow the prompts.   For any non-urgent questions, you may also contact your provider using MyChart. We now offer e-Visits for anyone 37 and older to request care online for non-urgent symptoms. For details visit [email or web address].   Also download the MyChart app! Go to the app store, search "MyChart", open the app, select [facility], and log in with your MyChart username and password.  Masks are optional in the cancer centers. If you would like for your care team to wear a mask while they are taking care of you, please let them know. You may have one support person who is at least 65 years old accompany you for your appointments. Pembrolizumab Injection What is this medication? PEMBROLIZUMAB (PEM broe LIZ ue mab) treats some types of cancer. It works by helping your immune system slow or stop the spread of cancer cells. It is a monoclonal antibody. This medicine may be used for other purposes; ask your health care provider or pharmacist if you have questions. COMMON BRAND NAME(S): [employer]  What should I tell my care team before I take this medication? They need to know if you have any of these conditions: Allogeneic stem cell transplant (uses someone else's stem cells) Autoimmune diseases, such as Crohn disease, ulcerative colitis, lupus History of chest radiation Nervous system problems, such as Guillain-Barre  syndrome, myasthenia gravis Organ transplant An unusual or allergic reaction to pembrolizumab, other medications, foods, dyes, or preservatives Pregnant or trying to get pregnant Breast-feeding How should I use this medication? This medication is injected into a vein. It is given by your care team in a hospital or clinic setting. A special MedGuide will be given to you before each treatment. Be sure to read this information carefully each time. Talk to your care team about the use of this medication in children. While it may be prescribed for children as young as 6 months for selected conditions, precautions do apply. Overdosage: If you think you have taken too much of this medicine contact a poison control center or emergency room at once. NOTE: This medicine is only for you. Do not share this medicine with others. What if I miss a dose? Keep appointments for follow-up doses. It is important not to miss your dose. Call your care team if you are unable to keep an appointment. What may interact with this medication? Interactions have not been studied. This list may not describe all possible interactions. Give your health care provider a list of all the medicines, herbs, non-prescription drugs, or dietary supplements you use. Also tell them if you smoke, drink alcohol, or use illegal drugs. Some items may interact with your medicine. What should I watch for while using this medication? Your condition will be monitored carefully while you are receiving this medication. You may need blood work while taking this medication. This medication may cause serious skin reactions. They can happen weeks to months after starting the medication. Contact your care team right away if you notice fevers or flu-like symptoms with a rash. The rash may be red or purple and then turn into blisters or peeling of the skin. You may also notice a red rash with swelling of the face, lips, or lymph nodes in your neck or under  your arms. Tell your care team right away if you have any change in your eyesight. Talk to your care team if you may be pregnant. Serious birth defects can occur if you take this medication during pregnancy and for 4 months after the last dose. You will need a negative pregnancy test before starting this medication. Contraception is recommended while taking this medication and for 4 months after the last dose. Your care team can help you find the option that works for you. Do not breastfeed while taking this medication and for 4 months after the last dose. What side effects may I notice from receiving this medication? Side effects that you should report to your care team as soon as possible: Allergic reactions--skin rash, itching, hives, swelling of the face, lips, tongue, or throat Dry cough, shortness of breath or trouble breathing Eye pain, redness, irritation, or discharge with blurry or decreased vision Heart muscle inflammation--unusual weakness or fatigue, shortness of breath, chest pain, fast or irregular heartbeat, dizziness, swelling of the ankles, feet, or hands Hormone gland problems--headache, sensitivity to light, unusual weakness or fatigue, dizziness, fast or irregular heartbeat, increased sensitivity to cold or heat, excessive sweating, constipation, hair loss, increased thirst or amount of urine, tremors or shaking, irritability Infusion reactions--chest pain, shortness of  breath or trouble breathing, feeling faint or lightheaded Kidney injury (glomerulonephritis)--decrease in the amount of urine, red or dark brown urine, foamy or bubbly urine, swelling of the ankles, hands, or feet Liver injury--right upper belly pain, loss of appetite, nausea, light-colored stool, dark yellow or brown urine, yellowing skin or eyes, unusual weakness or fatigue Pain, tingling, or numbness in the hands or feet, muscle weakness, change in vision, confusion or trouble speaking, loss of balance or  coordination, trouble walking, seizures Rash, fever, and swollen lymph nodes Redness, blistering, peeling, or loosening of the skin, including inside the mouth Sudden or severe stomach pain, bloody diarrhea, fever, nausea, vomiting Side effects that usually do not require medical attention (report to your care team if they continue or are bothersome): Bone, joint, or muscle pain Diarrhea Fatigue Loss of appetite Nausea Skin rash This list may not describe all possible side effects. Call your doctor for medical advice about side effects. You may report side effects to FDA at 1-800-FDA-1088. Where should I keep my medication? This medication is given in a hospital or clinic. It will not be stored at home. NOTE: This sheet is a summary. It may not cover all possible information. If you have questions about this medicine, talk to your doctor, pharmacist, or health care provider.  2023 Elsevier/Gold Standard (2013-03-11 00:00:00) Paclitaxel Injection What is this medication? PACLITAXEL (PAK li TAX el) treats some types of cancer. It works by slowing down the growth of cancer cells. This medicine may be used for other purposes; ask your health care provider or pharmacist if you have questions. COMMON BRAND NAME(S): Onxol, Taxol What should I tell my care team before I take this medication? They need to know if you have any of these conditions: Heart disease Liver disease Low white blood cell levels An unusual or allergic reaction to paclitaxel, other medications, foods, dyes, or preservatives If you or your partner are pregnant or trying to get pregnant Breast-feeding How should I use this medication? This medication is injected into a vein. It is given by your care team in a hospital or clinic setting. Talk to your care team about the use of this medication in children. While it may be given to children for selected conditions, precautions do apply. Overdosage: If you think you have taken  too much of this medicine contact a poison control center or emergency room at once. NOTE: This medicine is only for you. Do not share this medicine with others. What if I miss a dose? Keep appointments for follow-up doses. It is important not to miss your dose. Call your care team if you are unable to keep an appointment. What may interact with this medication? Do not take this medication with any of the following: Live virus vaccines Other medications may affect the way this medication works. Talk with your care team about all of the medications you take. They may suggest changes to your treatment plan to lower the risk of side effects and to make sure your medications work as intended. This list may not describe all possible interactions. Give your health care provider a list of all the medicines, herbs, non-prescription drugs, or dietary supplements you use. Also tell them if you smoke, drink alcohol, or use illegal drugs. Some items may interact with your medicine. What should I watch for while using this medication? Your condition will be monitored carefully while you are receiving this medication. You may need blood work while taking this medication. This medication may make  you feel generally unwell. This is not uncommon as chemotherapy can affect healthy cells as well as cancer cells. Report any side effects. Continue your course of treatment even though you feel ill unless your care team tells you to stop. This medication can cause serious allergic reactions. To reduce the risk, your care team may give you other medications to take before receiving this one. Be sure to follow the directions from your care team. This medication may increase your risk of getting an infection. Call your care team for advice if you get a fever, chills, sore throat, or other symptoms of a cold or flu. Do not treat yourself. Try to avoid being around people who are sick. This medication may increase your risk to  bruise or bleed. Call your care team if you notice any unusual bleeding. Be careful brushing or flossing your teeth or using a toothpick because you may get an infection or bleed more easily. If you have any dental work done, tell your dentist you are receiving this medication. Talk to your care team if you may be pregnant. Serious birth defects can occur if you take this medication during pregnancy. Talk to your care team before breastfeeding. Changes to your treatment plan may be needed. What side effects may I notice from receiving this medication? Side effects that you should report to your care team as soon as possible: Allergic reactions--skin rash, itching, hives, swelling of the face, lips, tongue, or throat Heart rhythm changes--fast or irregular heartbeat, dizziness, feeling faint or lightheaded, chest pain, trouble breathing Increase in blood pressure Infection--fever, chills, cough, sore throat, wounds that don't heal, pain or trouble when passing urine, general feeling of discomfort or being unwell Low blood pressure--dizziness, feeling faint or lightheaded, blurry vision Low red blood cell level--unusual weakness or fatigue, dizziness, headache, trouble breathing Painful swelling, warmth, or redness of the skin, blisters or sores at the infusion site Pain, tingling, or numbness in the hands or feet Slow heartbeat--dizziness, feeling faint or lightheaded, confusion, trouble breathing, unusual weakness or fatigue Unusual bruising or bleeding Side effects that usually do not require medical attention (report to your care team if they continue or are bothersome): Diarrhea Hair loss Joint pain Loss of appetite Muscle pain Nausea Vomiting This list may not describe all possible side effects. Call your doctor for medical advice about side effects. You may report side effects to FDA at 1-800-FDA-1088. Where should I keep my medication? This medication is given in a hospital or clinic.  It will not be stored at home. NOTE: This sheet is a summary. It may not cover all possible information. If you have questions about this medicine, talk to your doctor, pharmacist, or health care provider.  2023 Elsevier/Gold Standard (2021-10-20 00:00:00)  Carboplatin Injection What is this medication? CARBOPLATIN (KAR boe pla tin) treats some types of cancer. It works by slowing down the growth of cancer cells. This medicine may be used for other purposes; ask your health care provider or pharmacist if you have questions. COMMON BRAND NAME(S): Paraplatin What should I tell my care team before I take this medication? They need to know if you have any of these conditions: Blood disorders Hearing problems Kidney disease Recent or ongoing radiation therapy An unusual or allergic reaction to carboplatin, cisplatin, other medications, foods, dyes, or preservatives Pregnant or trying to get pregnant Breast-feeding How should I use this medication? This medication is injected into a vein. It is given by your care team in a hospital  or clinic setting. Talk to your care team about the use of this medication in children. Special care may be needed. Overdosage: If you think you have taken too much of this medicine contact a poison control center or emergency room at once. NOTE: This medicine is only for you. Do not share this medicine with others. What if I miss a dose? Keep appointments for follow-up doses. It is important not to miss your dose. Call your care team if you are unable to keep an appointment. What may interact with this medication? Medications for seizures Some antibiotics, such as amikacin, gentamicin, neomycin, streptomycin, tobramycin Vaccines This list may not describe all possible interactions. Give your health care provider a list of all the medicines, herbs, non-prescription drugs, or dietary supplements you use. Also tell them if you smoke, drink alcohol, or use illegal  drugs. Some items may interact with your medicine. What should I watch for while using this medication? Your condition will be monitored carefully while you are receiving this medication. You may need blood work while taking this medication. This medication may make you feel generally unwell. This is not uncommon, as chemotherapy can affect healthy cells as well as cancer cells. Report any side effects. Continue your course of treatment even though you feel ill unless your care team tells you to stop. In some cases, you may be given additional medications to help with side effects. Follow all directions for their use. This medication may increase your risk of getting an infection. Call your care team for advice if you get a fever, chills, sore throat, or other symptoms of a cold or flu. Do not treat yourself. Try to avoid being around people who are sick. Avoid taking medications that contain aspirin, acetaminophen, ibuprofen, naproxen, or ketoprofen unless instructed by your care team. These medications may hide a fever. Be careful brushing or flossing your teeth or using a toothpick because you may get an infection or bleed more easily. If you have any dental work done, tell your dentist you are receiving this medication. Talk to your care team if you wish to become pregnant or think you might be pregnant. This medication can cause serious birth defects. Talk to your care team about effective forms of contraception. Do not breast-feed while taking this medication. What side effects may I notice from receiving this medication? Side effects that you should report to your care team as soon as possible: Allergic reactions--skin rash, itching, hives, swelling of the face, lips, tongue, or throat Infection--fever, chills, cough, sore throat, wounds that don't heal, pain or trouble when passing urine, general feeling of discomfort or being unwell Low red blood cell level--unusual weakness or fatigue,  dizziness, headache, trouble breathing Pain, tingling, or numbness in the hands or feet, muscle weakness, change in vision, confusion or trouble speaking, loss of balance or coordination, trouble walking, seizures Unusual bruising or bleeding Side effects that usually do not require medical attention (report to your care team if they continue or are bothersome): Hair loss Nausea Unusual weakness or fatigue Vomiting This list may not describe all possible side effects. Call your doctor for medical advice about side effects. You may report side effects to FDA at 1-800-FDA-1088. Where should I keep my medication? This medication is given in a hospital or clinic. It will not be stored at home. NOTE: This sheet is a summary. It may not cover all possible information. If you have questions about this medicine, talk to your doctor, pharmacist, or health  care provider.  2023 Elsevier/Gold Standard (2021-10-04 00:00:00)

## 2022-05-10 NOTE — Progress Notes (Signed)
The pharmacy team has substituted IV diphenhydramine for IV cetirizine as a premedication. Patient will be monitored for hypersensitivity reaction and adverse reactions to IV cetirizine. Thanks.    Kennith Center, Pharm.D., CPP 05/10/2022@9 :50 AM

## 2022-05-10 NOTE — Telephone Encounter (Signed)
Inez Catalina notified that per [facility] pt has "Likely 6 months if he does not receive treatment".

## 2022-05-11 ENCOUNTER — Ambulatory Visit: Payer: BC Managed Care – PPO | Admitting: Radiation Oncology

## 2022-05-11 ENCOUNTER — Encounter: Payer: Self-pay | Admitting: Medical Oncology

## 2022-05-11 ENCOUNTER — Telehealth: Payer: Self-pay | Admitting: Oncology

## 2022-05-11 ENCOUNTER — Telehealth: Payer: Self-pay

## 2022-05-11 ENCOUNTER — Telehealth: Payer: Self-pay | Admitting: Medical Oncology

## 2022-05-11 LAB — T4: T4, Total: 6.5 ug/dL (ref 4.5–12.0)

## 2022-05-11 NOTE — Telephone Encounter (Signed)
-----   Message from Rafael Bihari, RN sent at 05/10/2022  4:36 PM EST ----- Regarding: Dr Julien Nordmann pt, first time Taxol/Carbo/Keytruda Dr Julien Nordmann pt came in for first time Taxol/Carbo/Keytruda. Tolerated infusions well. Needs call back.

## 2022-05-11 NOTE — Telephone Encounter (Signed)
Roger [name] called and needs to reschedule his CT [facility] appointment today.  He is not feeling well after chemotherapy yesterday and is confused about his appointments.  Advised him that we can reschedule his CT Sim appointment to Friday, 05/13/22 at 9:00 am before his port placement at 11:30.  Also advised we will provide him with a new schedule with all his appointments on one calendar.  He verbalized understanding and agreement of his new Sim appointment date and time.

## 2022-05-11 NOTE — Telephone Encounter (Signed)
LM for patient that this nurse was calling to see how they were doing after their treatment. Please call back to Dr. Mohamed's nurse at [phone] if they have any questions or concerns regarding the treatment.  

## 2022-05-11 NOTE — Telephone Encounter (Signed)
Work excuse letter faxed letter for [employer] , [employer],   Copy of letter given to flush nurse to give to pt tomorrow when he comes in for injection.

## 2022-05-12 ENCOUNTER — Other Ambulatory Visit: Payer: Self-pay

## 2022-05-12 ENCOUNTER — Other Ambulatory Visit: Payer: Self-pay | Admitting: Internal Medicine

## 2022-05-12 ENCOUNTER — Inpatient Hospital Stay: Payer: BC Managed Care – PPO

## 2022-05-12 VITALS — BP 127/60 | HR 87 | Temp 99.0°F | Resp 20

## 2022-05-12 DIAGNOSIS — C3481 Malignant neoplasm of overlapping sites of right bronchus and lung: Secondary | ICD-10-CM | POA: Diagnosis not present

## 2022-05-12 DIAGNOSIS — C3492 Malignant neoplasm of unspecified part of left bronchus or lung: Secondary | ICD-10-CM

## 2022-05-12 DIAGNOSIS — R918 Other nonspecific abnormal finding of lung field: Secondary | ICD-10-CM

## 2022-05-12 MED ORDER — PEGFILGRASTIM-CBQV 6 MG/0.6ML ~~LOC~~ SOSY
6.0000 mg | PREFILLED_SYRINGE | Freq: Once | SUBCUTANEOUS | Status: AC
  Administered 2022-05-12: 6 mg via SUBCUTANEOUS
  Filled 2022-05-12: qty 0.6

## 2022-05-13 ENCOUNTER — Ambulatory Visit (HOSPITAL_COMMUNITY): Payer: BC Managed Care – PPO

## 2022-05-13 ENCOUNTER — Ambulatory Visit: Payer: BC Managed Care – PPO | Admitting: Radiation Oncology

## 2022-05-13 ENCOUNTER — Inpatient Hospital Stay (HOSPITAL_COMMUNITY)
Admission: RE | Admit: 2022-05-13 | Discharge: 2022-05-13 | Disposition: A | Discharge status: Home / Self Care | Payer: BC Managed Care – PPO | Source: Ambulatory Visit | Attending: Internal Medicine | Admitting: Internal Medicine

## 2022-05-13 NOTE — Progress Notes (Unsigned)
[name] OFFICE PROGRESS NOTE  Roger Halim., PA-C [address] [place] [address]  DIAGNOSIS:  Stage IV (T3, N3, M1 C) non-small cell lung cancer, squamous cell carcinoma presented with bilateral pulmonary masses and nodules as well as mediastinal and bilateral hilar adenopathy in addition to subcutaneous metastatic disease and bone metastasis diagnosed in October 2023 with PD-L1 expression of 1%.   PRIOR THERAPY: None  CURRENT THERAPY: 1) Palliative systemic chemotherapy with carboplatin for AUC of 5, paclitaxel 175 Mg/M2 and Keytruda 200 Mg IV every 3 weeks with Neulasta. Status post 1 cycle. First dose on 05/09/22 2) ***:Looks like the radiation appointments are cancelled***  INTERVAL HISTORY: Roger [name] 66 y.o. male returns to clinic today for follow-up visit accompanied by ***.  The patient was recently diagnosed with stage IV lung cancer.  He was referred to radiation oncology for some palliative radiation to the painful subcutaneous nodule in the abdomen***.  He saw Dr. Sondra Come from radiation oncology and they offered ***however it does not appear that this is scheduled at this time.  The patient opted to ***.  He was also supposed to have a Port-A-Cath placed but was not feeling well enough for this procedure.  The patient underwent his first cycle of treatment on 05/09/2022 and he tolerated it ***.  He is scheduled for his staging brain MRI later this week on ***.  Today he denies any fever, chills, or night sweats.  He denies any chest pain, shortness of breath, cough, or hemoptysis.  He denies any nausea, vomiting, diarrhea, or or constipation.  Denies any headache or visual changes.  Pain lumbar region and left anterior abdominal wall.  The patient is here today for evaluation and repeat blood work and to manage any adverse side effects of treatment.      MEDICAL HISTORY: Past Medical History:  Diagnosis Date   Anxiety    Asthma    COPD (chronic  obstructive pulmonary disease) ([place])    Depression    Hemorrhoids    History of rectal bleeding    Hypertension    Palpitations    Pneumonia    in 2015   Post-operative nausea and vomiting    pt unsure   Primary squamous cell carcinoma of left lung ([place]) 05/03/2022    ALLERGIES:  is allergic to demerol, erythromycin, prednisone, sulfa antibiotics, and augmentin [amoxicillin-pot clavulanate].  MEDICATIONS:  Current Outpatient Medications  Medication Sig Dispense Refill   albuterol (PROVENTIL HFA;VENTOLIN HFA) 108 (90 Base) MCG/ACT inhaler Inhale 2 puffs into the lungs every 6 (six) hours as needed for wheezing or shortness of breath.     ALPRAZolam (XANAX) 1 MG tablet Take 1 mg by mouth 5 (five) times daily.     aspirin EC 81 MG tablet Take 162 mg by mouth daily.      atenolol (TENORMIN) 50 MG tablet Take 50 mg by mouth daily.      atorvastatin (LIPITOR) 10 MG tablet Take 10 mg by mouth daily at 6 PM.      fluticasone (FLONASE) 50 MCG/ACT nasal spray Place 2 sprays into both nostrils daily.     ibuprofen (ADVIL,MOTRIN) 200 MG tablet Take 400 mg by mouth every 6 (six) hours as needed for headache, mild pain or moderate pain.      lidocaine-prilocaine (EMLA) cream Apply to the Port-A-Cath site 30-60 minutes before treatment. 30 g 0   metFORMIN (GLUCOPHAGE-XR) 500 MG 24 hr tablet Take 500 mg by mouth daily  with breakfast.     mirtazapine (REMERON) 15 MG tablet Take 15 mg by mouth at bedtime.     prochlorperazine (COMPAZINE) 10 MG tablet Take 1 tablet (10 mg total) by mouth every 6 (six) hours as needed for nausea or vomiting. (Patient not taking: Reported on 05/05/2022) 30 tablet 0   No current facility-administered medications for this visit.    SURGICAL HISTORY:  Past Surgical History:  Procedure Laterality Date   DENTAL SURGERY     MASS EXCISION Right 04/20/2022   Procedure: NEEDLE BIOPSY OF CHEST WALL MASS RIGHT LOWER CHEST WALL;  Surgeon: [name], MD;  Location: [facility];  Service: Thoracic;  Laterality: Right;   NOSE SURGERY     SKIN CANCER EXCISION     left forehead   TONSILLECTOMY     VIDEO BRONCHOSCOPY WITH ENDOBRONCHIAL NAVIGATION N/A 04/20/2022   Procedure: VIDEO BRONCHOSCOPY WITH ENDOBRONCHIAL NAVIGATION;  Surgeon: [name], MD;  Location: [facility];  Service: Thoracic;  Laterality: N/A;    REVIEW OF SYSTEMS:   Review of Systems  Constitutional: Negative for appetite change, chills, fatigue, fever and unexpected weight change.  HENT:   Negative for mouth sores, nosebleeds, sore throat and trouble swallowing.   Eyes: Negative for eye problems and icterus.  Respiratory: Negative for cough, hemoptysis, shortness of breath and wheezing.   Cardiovascular: Negative for chest pain and leg swelling.  Gastrointestinal: Negative for abdominal pain, constipation, diarrhea, nausea and vomiting.  Genitourinary: Negative for bladder incontinence, difficulty urinating, dysuria, frequency and hematuria.   Musculoskeletal: Negative for back pain, gait problem, neck pain and neck stiffness.  Skin: Negative for itching and rash.  Neurological: Negative for dizziness, extremity weakness, gait problem, headaches, light-headedness and seizures.  Hematological: Negative for adenopathy. Does not bruise/bleed easily.  Psychiatric/Behavioral: Negative for confusion, depression and sleep disturbance. The patient is not nervous/anxious.     PHYSICAL EXAMINATION:  There were no vitals taken for this visit.  ECOG PERFORMANCE STATUS: {CHL ONC ECOG [id number]  Physical Exam  Constitutional: Oriented to person, place, and time and well-developed, well-nourished, and in no distress. No distress.  HENT:  Head: Normocephalic and atraumatic.  Mouth/Throat: Oropharynx is clear and moist. No oropharyngeal exudate.  Eyes: Conjunctivae are normal. Right eye exhibits no discharge. Left eye exhibits no discharge. No scleral icterus.  Neck: Normal range of motion.  Neck supple.  Cardiovascular: Normal rate, regular rhythm, normal heart sounds and intact distal pulses.   Pulmonary/Chest: Effort normal and breath sounds normal. No respiratory distress. No wheezes. No rales.  Abdominal: Soft. Bowel sounds are normal. Exhibits no distension and no mass. There is no tenderness.  Musculoskeletal: Normal range of motion. Exhibits no edema.  Lymphadenopathy:    No cervical adenopathy.  Neurological: Alert and oriented to person, place, and time. Exhibits normal muscle tone. Gait normal. Coordination normal.  Skin: Skin is warm and dry. No rash noted. Not diaphoretic. No erythema. No pallor.  Psychiatric: Mood, memory and judgment normal.  Vitals reviewed.  LABORATORY DATA: Lab Results  Component Value Date   WBC 8.6 05/10/2022   HGB 11.6 (L) 05/10/2022   HCT 36.2 (L) 05/10/2022   MCV 91.2 05/10/2022   PLT 269 05/10/2022      Chemistry      Component Value Date/Time   NA 134 (L) 05/10/2022 0859   K 4.1 05/10/2022 0859   CL 100 05/10/2022 0859   CO2 26 05/10/2022 0859   BUN 8 05/10/2022 0859   CREATININE 0.74  05/10/2022 0859      Component Value Date/Time   CALCIUM 9.2 05/10/2022 0859   ALKPHOS 113 05/10/2022 0859   AST 12 (L) 05/10/2022 0859   ALT 11 05/10/2022 0859   BILITOT 0.3 05/10/2022 0859       RADIOGRAPHIC STUDIES:  DG C-ARM BRONCHOSCOPY  Result Date: 04/20/2022 C-ARM BRONCHOSCOPY: Fluoroscopy was utilized by the requesting physician.  No radiographic interpretation.   DG Chest 2 View  Result Date: 04/19/2022 CLINICAL DATA:  Pre-op clearance exam for left lung mass. EXAM: CHEST - 2 VIEW COMPARISON:  CT on 04/15/2022 FINDINGS: The heart size and mediastinal contours are within normal limits. Chronic bibasilar predominant interstitial fibrosis is again seen as well as emphysematous changes in upper lung zones. Increased confluent masslike opacity is seen in the central left lower lobe. Focal nodular density is also seen in the  peripheral left mid lung which is become more dense since prior exam. No evidence of pleural effusion. IMPRESSION: Two masslike opacities in the left mid and lower lung, better visualized on recent chest CT. Chronic interstitial fibrosis and emphysema. Electronically Signed   By: Marlaine Hind M.D.   On: 04/19/2022 08:35   CT Super D Chest Wo Contrast  Result Date: 04/15/2022 CLINICAL DATA:  Pre-op lung mass. Multiple hypermetabolic pulmonary nodules, thoracic adenopathy and osseous lesions on PET-CT suspicious for bronchogenic carcinoma. EXAM: CT CHEST WITHOUT CONTRAST TECHNIQUE: Multidetector CT imaging of the chest was performed using thin slice collimation for electromagnetic bronchoscopy planning purposes, without intravenous contrast. RADIATION DOSE REDUCTION: This exam was performed according to the departmental dose-optimization program which includes automated exposure control, adjustment of the mA and/or kV according to patient size and/or use of iterative reconstruction technique. COMPARISON:  PET-CT 03/24/2022. Outside CT of the chest, abdomen and pelvis 03/08/2022 without report. FINDINGS: Cardiovascular: Atherosclerosis of the aorta, great vessels and coronary arteries. There is central enlargement of the pulmonary arteries consistent with pulmonary arterial hypertension. The heart size is normal. There is no pericardial effusion. Mediastinum/Nodes: There are multiple mildly enlarged mediastinal and hilar lymph nodes which were mildly hypermetabolic on PET-CT. These include a 9 mm subcarinal node on image 78/2 and a left hilar node measuring approximately 10 mm on image 71/2. This adenopathy appears grossly unchanged. The thyroid gland, trachea and esophagus demonstrate no significant findings. Lungs/Pleura: No pleural effusion or pneumothorax. Dominant centrally in a chronic left lower lobe mass measures approximately 6.3 x 4.8 cm on image 76/8. Heterogeneous left upper lobe mass measures 5.7 x  3.8 cm on image 61/8. There is a spiculated nodule medially in the right lower lobe which measures up to 2.3 cm on image 100/8. These nodules are similar appearance to the recent prior studies and were hypermetabolic on PET-CT. Underlying severe centrilobular and paraseptal emphysema with scattered subpleural reticulation at both lung bases. Upper abdomen: The visualized upper abdomen appears stable, without significant findings. Musculoskeletal/Chest wall: An exophytic irregular soft tissue mass in the subcutaneous fat of the left upper abdomen has mildly enlarged from previous CT, measuring 3.8 x 2.8 cm on image 151/2. This was hypermetabolic on PET-CT and presumably reflects a soft tissue metastasis. Grossly stable lytic lesion involving the anterior aspect of the right 6th rib, hypermetabolic on PET-CT. IMPRESSION: 1. Imaging for bronchoscopy planning and guidance. 2. No significant change in multiple bilateral pulmonary nodules/masses, the largest in the left lower lobe, consistent with metastatic disease or synchronous primaries. 3. Multiple mildly enlarged mediastinal and hilar lymph nodes, hypermetabolic on PET-CT, suspicious for  metastatic disease. 4. Enlarging exophytic soft tissue mass in the left upper abdominal wall, presumably a soft tissue metastasis. 5. Grossly stable lytic lesion involving the anterior aspect of the right 6th rib, hypermetabolic on PET-CT. 6. Central enlargement of the pulmonary arteries consistent with pulmonary arterial hypertension. 7. Aortic Atherosclerosis (ICD10-I70.0) and Emphysema (ICD10-J43.9). Electronically Signed   By: Richardean Sale M.D.   On: 04/15/2022 13:12     ASSESSMENT/PLAN:   This is a very pleasant 66 years old white male recently diagnosed with stage IV (T3, N3, M1 C) non-small cell lung cancer, squamous cell carcinoma presented with bilateral pulmonary masses and nodules as well as mediastinal and bilateral hilar adenopathy in addition to subcutaneous  metastatic disease and bone metastasis diagnosed in October 2023 with PD-L1 expression of 1%.   The patient is currently undergoing palliative systemic chemotherapy with carboplatin for an AUC of 5, paclitaxel 175 mg per metered square, Keytruda 20 mg IV every 3 weeks with Neulasta support.  The patient is status post 1 cycle and tolerated it ***  Decision on palliative radiation?  The patient was seen with Dr. Julien Nordmann today.  Labs were reviewed.  Recommend that he ***  He will have his brain MRI as scheduled later this week  Port?  We will see the patient back for follow-up visit in 2 weeks for evaluation repeat blood work before considering starting cycle #2.  The patient was advised to call immediately if he has any concerning symptoms in the interval. The patient voices understanding of current disease status and treatment options and is in agreement with the current care plan. All questions were answered. The patient knows to call the clinic with any problems, questions or concerns. We can certainly see the patient much sooner if necessary       No orders of the defined types were placed in this encounter.    I spent {CHL ONC TIME VISIT - LTYVD:[id number]} counseling the patient face to face. The total time spent in the appointment was {CHL ONC TIME VISIT - ZZCKI:[id number]}.  Roger Litzinger L Osvaldo Lamping, PA-C 05/13/22

## 2022-05-14 ENCOUNTER — Encounter: Payer: Self-pay | Admitting: Thoracic Surgery (Cardiothoracic Vascular Surgery)

## 2022-05-15 ENCOUNTER — Other Ambulatory Visit: Payer: Self-pay

## 2022-05-16 ENCOUNTER — Other Ambulatory Visit: Payer: Self-pay | Admitting: Medical Oncology

## 2022-05-16 ENCOUNTER — Other Ambulatory Visit: Payer: BC Managed Care – PPO

## 2022-05-16 ENCOUNTER — Telehealth: Payer: Self-pay

## 2022-05-16 DIAGNOSIS — C3492 Malignant neoplasm of unspecified part of left bronchus or lung: Secondary | ICD-10-CM

## 2022-05-16 NOTE — Telephone Encounter (Signed)
This nurse returned a call to this patient related to him canceling his port placement due to him being to weak to get out of bed. Patient states that he is still feeling very weak and would like to wait until he is feeling better to reschedule. This nurse observed that patient is very winded and short of breath while talking, denies fever or cough.  This nurse made provider aware.  Patient states he will be coming for appointments on 11/14. No further concerns at this time.

## 2022-05-17 ENCOUNTER — Other Ambulatory Visit: Payer: BC Managed Care – PPO

## 2022-05-17 ENCOUNTER — Inpatient Hospital Stay: Payer: BC Managed Care – PPO

## 2022-05-17 ENCOUNTER — Inpatient Hospital Stay (HOSPITAL_BASED_OUTPATIENT_CLINIC_OR_DEPARTMENT_OTHER): Payer: BC Managed Care – PPO | Admitting: Physician Assistant

## 2022-05-17 ENCOUNTER — Telehealth: Payer: Self-pay

## 2022-05-17 VITALS — BP 121/70 | HR 73 | Temp 98.5°F | Wt 158.9 lb

## 2022-05-17 DIAGNOSIS — C3481 Malignant neoplasm of overlapping sites of right bronchus and lung: Secondary | ICD-10-CM | POA: Diagnosis not present

## 2022-05-17 DIAGNOSIS — T50905A Adverse effect of unspecified drugs, medicaments and biological substances, initial encounter: Secondary | ICD-10-CM

## 2022-05-17 DIAGNOSIS — C3492 Malignant neoplasm of unspecified part of left bronchus or lung: Secondary | ICD-10-CM

## 2022-05-17 DIAGNOSIS — K716 Toxic liver disease with hepatitis, not elsewhere classified: Secondary | ICD-10-CM

## 2022-05-17 DIAGNOSIS — R630 Anorexia: Secondary | ICD-10-CM

## 2022-05-17 LAB — CBC WITH DIFFERENTIAL (CANCER CENTER ONLY)
Abs Immature Granulocytes: 0.84 10*3/uL — ABNORMAL HIGH (ref 0.00–0.07)
Basophils Absolute: 0.1 10*3/uL (ref 0.0–0.1)
Basophils Relative: 0 %
Eosinophils Absolute: 0.1 10*3/uL (ref 0.0–0.5)
Eosinophils Relative: 1 %
HCT: 34.7 % — ABNORMAL LOW (ref 39.0–52.0)
Hemoglobin: 11.5 g/dL — ABNORMAL LOW (ref 13.0–17.0)
Immature Granulocytes: 5 %
Lymphocytes Relative: 7 %
Lymphs Abs: 1.2 10*3/uL (ref 0.7–4.0)
MCH: 29.3 pg (ref 26.0–34.0)
MCHC: 33.1 g/dL (ref 30.0–36.0)
MCV: 88.3 fL (ref 80.0–100.0)
Monocytes Absolute: 1.8 10*3/uL — ABNORMAL HIGH (ref 0.1–1.0)
Monocytes Relative: 10 %
Neutro Abs: 13.5 10*3/uL — ABNORMAL HIGH (ref 1.7–7.7)
Neutrophils Relative %: 77 %
Platelet Count: 103 10*3/uL — ABNORMAL LOW (ref 150–400)
RBC: 3.93 MIL/uL — ABNORMAL LOW (ref 4.22–5.81)
RDW: 13.2 % (ref 11.5–15.5)
WBC Count: 17.4 10*3/uL — ABNORMAL HIGH (ref 4.0–10.5)
nRBC: 2.4 % — ABNORMAL HIGH (ref 0.0–0.2)

## 2022-05-17 LAB — CMP (CANCER CENTER ONLY)
ALT: 693 U/L (ref 0–44)
AST: 123 U/L — ABNORMAL HIGH (ref 15–41)
Albumin: 3.5 g/dL (ref 3.5–5.0)
Alkaline Phosphatase: 191 U/L — ABNORMAL HIGH (ref 38–126)
Anion gap: 12 (ref 5–15)
BUN: 30 mg/dL — ABNORMAL HIGH (ref 8–23)
CO2: 24 mmol/L (ref 22–32)
Calcium: 8.7 mg/dL — ABNORMAL LOW (ref 8.9–10.3)
Chloride: 98 mmol/L (ref 98–111)
Creatinine: 1.16 mg/dL (ref 0.61–1.24)
GFR, Estimated: >60 mL/min (ref >60)
Glucose, Bld: 263 mg/dL — ABNORMAL HIGH (ref 70–99)
Potassium: 3.3 mmol/L — ABNORMAL LOW (ref 3.5–5.1)
Sodium: 134 mmol/L — ABNORMAL LOW (ref 135–145)
Total Bilirubin: 0.9 mg/dL (ref 0.3–1.2)
Total Protein: 7.3 g/dL (ref 6.5–8.1)

## 2022-05-17 MED ORDER — METHYLPREDNISOLONE 4 MG PO TBPK: ORAL_TABLET | ORAL | 0 refills | Status: DC

## 2022-05-17 NOTE — Telephone Encounter (Signed)
CRITICAL VALUE STICKER  CRITICAL VALUE:  ALT [id number]  RECEIVER (on-site recipient of call):  Selso Mannor P. LPN  DATE & TIME NOTIFIED: 05/17/22 3:52 pm  MESSENGER (representative from lab): Lauren   MD NOTIFIED: Rubin Payor Heilingoetter, PA, Dr. Julien Nordmann  TIME OF NOTIFICATION: 3:53 pm.

## 2022-05-18 ENCOUNTER — Other Ambulatory Visit: Payer: Self-pay

## 2022-05-19 ENCOUNTER — Ambulatory Visit
Admission: RE | Admit: 2022-05-19 | Discharge: 2022-05-19 | Disposition: A | Discharge status: Home / Self Care | Payer: BC Managed Care – PPO | Source: Ambulatory Visit | Attending: Thoracic Surgery (Cardiothoracic Vascular Surgery) | Admitting: Thoracic Surgery (Cardiothoracic Vascular Surgery)

## 2022-05-19 DIAGNOSIS — R918 Other nonspecific abnormal finding of lung field: Secondary | ICD-10-CM

## 2022-05-19 DIAGNOSIS — Z01818 Encounter for other preprocedural examination: Secondary | ICD-10-CM

## 2022-05-19 DIAGNOSIS — C7931 Secondary malignant neoplasm of brain: Secondary | ICD-10-CM

## 2022-05-19 MED ORDER — GADOPICLENOL 0.5 MMOL/ML IV SOLN
7.0000 mL | Freq: Once | INTRAVENOUS | Status: AC | PRN
  Administered 2022-05-19: 7 mL via INTRAVENOUS

## 2022-05-23 ENCOUNTER — Other Ambulatory Visit: Payer: Self-pay | Admitting: Physician Assistant

## 2022-05-23 ENCOUNTER — Ambulatory Visit: Payer: BC Managed Care – PPO | Admitting: Radiation Oncology

## 2022-05-23 ENCOUNTER — Ambulatory Visit
Admission: RE | Admit: 2022-05-23 | Discharge: 2022-05-23 | Disposition: A | Discharge status: Home / Self Care | Payer: BC Managed Care – PPO | Source: Ambulatory Visit | Attending: Radiation Oncology | Admitting: Radiation Oncology

## 2022-05-23 ENCOUNTER — Other Ambulatory Visit: Payer: Self-pay

## 2022-05-23 DIAGNOSIS — R609 Edema, unspecified: Secondary | ICD-10-CM | POA: Insufficient documentation

## 2022-05-23 DIAGNOSIS — C3432 Malignant neoplasm of lower lobe, left bronchus or lung: Secondary | ICD-10-CM | POA: Diagnosis not present

## 2022-05-23 DIAGNOSIS — Z510 Encounter for antineoplastic radiation therapy: Secondary | ICD-10-CM | POA: Diagnosis not present

## 2022-05-23 DIAGNOSIS — R7989 Other specified abnormal findings of blood chemistry: Secondary | ICD-10-CM

## 2022-05-23 DIAGNOSIS — C3492 Malignant neoplasm of unspecified part of left bronchus or lung: Secondary | ICD-10-CM

## 2022-05-23 NOTE — Progress Notes (Signed)
[facility] OFFICE PROGRESS NOTE  Aletha Halim., PA-C [address] [place] [address]  DIAGNOSIS: Stage IV (T3, N3, M1 C) non-small cell lung cancer, squamous cell carcinoma presented with bilateral pulmonary masses and nodules as well as mediastinal and bilateral hilar adenopathy in addition to subcutaneous metastatic disease and bone metastasis diagnosed in October 2023 with PD-L1 expression of 1%.    He has no actionable mutations.   PRIOR THERAPY: None  CURRENT THERAPY:  1) Palliative systemic chemotherapy with carboplatin for AUC of 5, paclitaxel 175 Mg/M2 and Keytruda 200 Mg IV every 3 weeks with Neulasta. Status post 1 cycle. First dose on 05/09/22 2) Palliative radiation to the painful abdominal lesion under the care of Dr. Sondra Come. Simulation scheduled for 05/23/22  INTERVAL HISTORY: Roger [name] 66 y.o. male returns to the clinic today for a follow-up visit accompanied by his sister in law.  The patient was recently diagnosed with stage IV lung cancer.  He is currently undergoing palliative systemic chemotherapy and immunotherapy.  Following cycle #1, the tolerance patient tolerated fair except for some fatigue and following the Neulasta injection.  However, his weekly labs revealed significantly elevated LFTs for which she was placed on a Medrol Dosepak.  Unfortunately, despite extensive education on this, he never started his medrol dose pack. Luckily, his LFTs did improve on weekly labs. He denied any jaundice, itching, abdominal pain, alcohol, or tylenol use.   Also since last being seen, the patient was seen in radiation oncology last week with new onset left lower extremity swelling. Dr. Sondra Come ordered doppler which was positive for acute DVT involving the left  posterior tibial veins, left peroneal veins, left gastrocnemius veins, and left soleal veins. The patient was called and instructed to start eliquis starter pack. He never started this. He took his  first dose while in the clinic this morning. His oxygen saturation in the clinic on multiple machines hovered around 85-86%. He states he has never required oxygen. He denies unusual shortness of breath or chest pain. No tachycardia or hypotension. His sister in law states he has been coughing more. No hemoptysis. He does not have a pulmonologist.   The patient denies any vomiting, constipation, or diarrhea at this time. He did have mild nausea at times but has not needed to take his anti-emetic. He denies any fever, chills, or night sweats.  At his last appointment while his weight was stable he was endorsing decreased appetite and was interested in seeing a member the nutritionist team which he has been referred to and had an appointment last week.  He is currently undergoing palliative radiation to a subcutaneous lesion in the abdomen under the care of Dr. Sondra Come.  The patient is here today for evaluation and repeat blood work before considering starting cycle #2     MEDICAL HISTORY: Past Medical History:  Diagnosis Date   Anxiety    Asthma    COPD (chronic obstructive pulmonary disease) ([facility])    Depression    Hemorrhoids    History of rectal bleeding    Hypertension    Palpitations    Pneumonia    in 2015   Post-operative nausea and vomiting    pt unsure   Primary squamous cell carcinoma of left lung ([facility]) 05/03/2022    ALLERGIES:  is allergic to demerol, erythromycin, prednisone, sulfa antibiotics, and augmentin [amoxicillin-pot clavulanate].  MEDICATIONS:  Current Outpatient Medications  Medication Sig Dispense Refill   potassium chloride SA (KLOR-CON  M) 20 MEQ tablet Take 1 tablet (20 mEq total) by mouth daily. 6 tablet 0   albuterol (PROVENTIL HFA;VENTOLIN HFA) 108 (90 Base) MCG/ACT inhaler Inhale 2 puffs into the lungs every 6 (six) hours as needed for wheezing or shortness of breath.     ALPRAZolam (XANAX) 1 MG tablet Take 1 mg by mouth 5 (five) times daily.     apixaban  (ELIQUIS) 5 MG TABS tablet Take 2 tablets (78m) twice daily for 7 days, then 1 tablet (572m twice daily 60 tablet 0   atenolol (TENORMIN) 50 MG tablet Take 50 mg by mouth daily.      atorvastatin (LIPITOR) 10 MG tablet Take 10 mg by mouth daily at 6 PM.      fluticasone (FLONASE) 50 MCG/ACT nasal spray Place 2 sprays into both nostrils daily.     lidocaine-prilocaine (EMLA) cream Apply to the Port-A-Cath site 30-60 minutes before treatment. 30 g 0   metFORMIN (GLUCOPHAGE-XR) 500 MG 24 hr tablet Take 500 mg by mouth daily with breakfast.     methylPREDNISolone (MEDROL DOSEPAK) 4 MG TBPK tablet Use as instructed 21 tablet 0   mirtazapine (REMERON) 15 MG tablet Take 15 mg by mouth at bedtime.     prochlorperazine (COMPAZINE) 10 MG tablet Take 1 tablet (10 mg total) by mouth every 6 (six) hours as needed for nausea or vomiting. 30 tablet 0   No current facility-administered medications for this visit.    SURGICAL HISTORY:  Past Surgical History:  Procedure Laterality Date   DENTAL SURGERY     MASS EXCISION Right 04/20/2022   Procedure: NEEDLE BIOPSY OF CHEST WALL MASS RIGHT LOWER CHEST WALL;  Surgeon: HeMelrose NakayamaMD;  [facility]: Thoracic;  Laterality: Right;   NOSE SURGERY     SKIN CANCER EXCISION     left forehead   TONSILLECTOMY     VIDEO BRONCHOSCOPY WITH ENDOBRONCHIAL NAVIGATION N/A 04/20/2022   Procedure: VIDEO BRONCHOSCOPY WITH ENDOBRONCHIAL NAVIGATION;  Surgeon: HeMelrose NakayamaMD;  [facility];  Laterality: N/A;    REVIEW OF SYSTEMS:   Review of Systems  Constitutional: Positive for stable fatigue.  Negative for chills, fever and unexpected weight change.  HENT: Negative for mouth sores, nosebleeds, sore throat and trouble swallowing.   Eyes: Negative for eye problems and icterus.  Respiratory: Positive for stable dyspnea on exertion and mild intermittent cough.  Negative for hemoptysis and wheezing.   Cardiovascular:  Negative for chest pain. Positive for bilateral lower extremity swelling.  Gastrointestinal: Positive for intermittent nausea.  Negative for constipation, diarrhea, constipation and vomiting.  Genitourinary: Negative for bladder incontinence, difficulty urinating, dysuria, frequency and hematuria.   Musculoskeletal: Negative for back pain, gait problem, neck pain and neck stiffness.  Skin: Negative for itching and rash.  Neurological: Negative for dizziness, extremity weakness, gait problem, headaches, light-headedness and seizures.  Hematological: Negative for adenopathy. Does not bruise/bleed easily.  Psychiatric/Behavioral: Negative for confusion, depression and sleep disturbance. The patient is not nervous/anxious.   PHYSICAL EXAMINATION:  Blood pressure (!) 122/98, pulse 81, temperature 98.2 F (36.8 C), temperature source Oral, resp. rate 17, weight 166 lb 1 oz (75.3 kg), SpO2 (!) 86 %.  ECOG PERFORMANCE STATUS: 1  Physical Exam  Constitutional: Oriented to person, place, and time and well-developed, well-nourished, and in no distress.  HENT:  Head: Normocephalic and atraumatic.  Mouth/Throat: Oropharynx is clear and moist. No oropharyngeal exudate.  Eyes: Conjunctivae are normal. Right eye exhibits  no discharge. Left eye exhibits no discharge. No scleral icterus.  Neck: Normal range of motion. Neck supple.  Cardiovascular: Normal rate, regular rhythm, normal heart sounds and intact distal pulses.   Pulmonary/Chest: Effort normal and breath sounds normal. No respiratory distress. No wheezes. No rales.  Abdominal: Soft. Bowel sounds are normal. Exhibits no distension and no mass. There is no tenderness.  Musculoskeletal: Normal range of motion. Positive for bilateral lower extremity swelling and dry skin.  Lymphadenopathy:    No cervical adenopathy.  Neurological: Alert and oriented to person, place, and time. Exhibits normal muscle tone. Gait normal. Coordination normal.  Skin:  Skin is warm and dry. No rash noted. Not diaphoretic. No erythema. No pallor.  Psychiatric: Mood, memory and judgment normal.  Vitals reviewed.  LABORATORY DATA: Lab Results  Component Value Date   WBC 8.8 05/31/2022   HGB 11.5 (L) 05/31/2022   HCT 36.7 (L) 05/31/2022   MCV 93.4 05/31/2022   PLT 157 05/31/2022      Chemistry      Component Value Date/Time   NA 138 05/31/2022 0811   K 3.3 (L) 05/31/2022 0811   CL 104 05/31/2022 0811   CO2 26 05/31/2022 0811   BUN 13 05/31/2022 0811   CREATININE 0.87 05/31/2022 0811      Component Value Date/Time   CALCIUM 9.1 05/31/2022 0811   ALKPHOS 145 (H) 05/31/2022 0811   AST 17 05/31/2022 0811   ALT 23 05/31/2022 0811   BILITOT 0.8 05/31/2022 0811       RADIOGRAPHIC STUDIES:  VAS [place] LOWER EXTREMITY VENOUS (DVT)  Result Date: 05/24/2022  Lower Venous DVT Study Patient Name:  FADEL CLASON  Date of Exam:   05/24/2022 Medical Rec #: [id number]    Accession #:    [id number] Date of Birth: Mar 06, 1956   Patient Gender: M Patient Age:   12 years Exam [facility]  [facility] Procedure:      VAS [place] LOWER EXTREMITY VENOUS (DVT) Referring Phys: Gery Pray --------------------------------------------------------------------------------  Indications: Swelling.  Risk Factors: Cancer. Comparison Study: No prior studies. Performing Technologist: Oliver Hum RVT  Examination Guidelines: A complete evaluation includes B-mode imaging, spectral Doppler, color Doppler, and power Doppler as needed of all accessible portions of each vessel. Bilateral testing is considered an integral part of a complete examination. Limited examinations for reoccurring indications may be performed as noted. The reflux portion of the exam is performed with the patient in reverse Trendelenburg.  +-----+---------------+---------+-----------+----------+--------------+ RIGHTCompressibilityPhasicitySpontaneityPropertiesThrombus Aging  +-----+---------------+---------+-----------+----------+--------------+ CFV  Full           Yes      Yes                                 +-----+---------------+---------+-----------+----------+--------------+   +---------+---------------+---------+-----------+----------+--------------+ LEFT     CompressibilityPhasicitySpontaneityPropertiesThrombus Aging +---------+---------------+---------+-----------+----------+--------------+ CFV      Full           Yes      Yes                                 +---------+---------------+---------+-----------+----------+--------------+ SFJ      Full                                                        +---------+---------------+---------+-----------+----------+--------------+  FV Prox  Full                                                        +---------+---------------+---------+-----------+----------+--------------+ FV Mid   Full                                                        +---------+---------------+---------+-----------+----------+--------------+ FV DistalFull                                                        +---------+---------------+---------+-----------+----------+--------------+ PFV      Full                                                        +---------+---------------+---------+-----------+----------+--------------+ POP      Full           Yes      Yes                                 +---------+---------------+---------+-----------+----------+--------------+ PTV      Partial                                      Acute          +---------+---------------+---------+-----------+----------+--------------+ PERO     None                                         Acute          +---------+---------------+---------+-----------+----------+--------------+ Soleal   Partial                                      Acute           +---------+---------------+---------+-----------+----------+--------------+ Gastroc  Partial                                      Acute          +---------+---------------+---------+-----------+----------+--------------+ GSV      None                                         Acute          +---------+---------------+---------+-----------+----------+--------------+    Summary: RIGHT: - No evidence of common femoral vein obstruction.  LEFT: - Findings consistent with acute deep vein thrombosis involving the left posterior  tibial veins, left peroneal veins, left gastrocnemius veins, and left soleal veins. - No cystic structure found in the popliteal fossa.  *See table(s) above for measurements and observations. Electronically signed by Deitra Mayo MD on 05/24/2022 at 4:45:09 PM.    Final    MR Brain W Wo Contrast  Result Date: 05/21/2022 CLINICAL DATA:  Brain metastases suspected Lung mass, preop, r/o brain metastases EXAM: MRI HEAD WITHOUT AND WITH CONTRAST TECHNIQUE: Multiplanar, multiecho pulse sequences of the brain and surrounding structures were obtained without and with intravenous contrast. CONTRAST:  7 mL of [place] IV. COMPARISON:  None Available. FINDINGS: Brain: No acute infarction, hemorrhage, hydrocephalus, extra-axial collection or mass lesion. No pathologic enhancement. Vascular: Major arterial flow voids are maintained at the skull base. Small incidental developmental venous anomaly in the right posterior parietotemporal region. Skull and upper cervical spine: Normal marrow signal. Sinuses/Orbits: Clear sinuses.  No acute orbital findings. Other: No mastoid effusions. IMPRESSION: No evidence of acute intracranial abnormality or metastatic disease. Electronically Signed   By: Margaretha Sheffield M.D.   On: 05/21/2022 10:56     ASSESSMENT/PLAN:  This is a very pleasant 66 years old Caucasian male recently diagnosed with stage IV (T3, N3, M1 C) non-small cell lung cancer,  squamous cell carcinoma presented with bilateral pulmonary masses and nodules as well as mediastinal and bilateral hilar adenopathy in addition to subcutaneous metastatic disease and bone metastasis diagnosed in October 2023 with PD-L1 expression of 1%.  Negative for any actionable mutations by foundation 1   The patient is currently undergoing palliative systemic chemotherapy with carboplatin for an AUC of 5, paclitaxel 175 mg per metered square, Keytruda 20 mg IV every 3 weeks with Neulasta support.  The patient is status post 1 cycle and tolerated it fair except for fatigue and mild diarrhea.  The patient's labs showed significantly elevated LFTs.  He was started on a Medrol Dosepak, although he never started this.  The patient denies any alcohol use.    I previously discussed the rationale behind Claritin with Neulasta injections.  Moving forward, he will try taking Claritin for approximately 4 to 7 days after receiving Neulasta.  I also encouraged him to take Imodium if needed for diarrhea.   The patient is going to undergo palliative radiation to the left abdominal subcutaneous nodule under the care of Dr. Sondra Come.  His simulation was on 05/23/2022.  The patient was found to have acute DVT last week involving involving the left  posterior tibial veins, left peroneal veins, left gastrocnemius veins, and  left soleal veins. He was started on eliquis. Despite several phone calls reviewing this with him, he never started this. He called yesterday and instructed to start eliquis, which he did not take his first dose until arriving to the clinic today.   Discussed in the setting of malignancy, we would recommend continuous anti-coagulation. Once he completes his eliquis starter pack, we will need to prescribe a maintenance pack.   Today, patient's oxygen is hovering around 85-86% despite using multiple machines.  The patient is insistent that his oxygen is "always" like this.  Discussed with the patient  that it is important to find the etiology of hypoxia instead of just prescribing oxygen without ruling out/treating underlying cause.  I discussed with the patient in the setting of known acute DVT last week and the fact that the patient never started his blood thinner, there is imperative to rule out life-threatening etiologies of hypoxia such as PE.  Luckily, the patient  denies any chest pain or shortness of breath.  His pulse and blood pressure are within normal limits.   His labs look adequate for treatment.  If the patient is negative for PE, we will reschedule his infusion for Friday 12/1 and arrange for his Neulasta injection on Monday 12/4.  He seems to tolerate cycle #1 well.  We will see him back for follow-up visit in 3 weeks for evaluation repeat blood work before undergoing next cycle of treatment.  I will arrange for the patient to have likely the lab work performed.  He will continue to be followed by member the nutritionist team.  Reviewed his brain MRI which was negative for metastatic disease to the brain.  The patient was advised to call immediately if she has any concerning symptoms in the interval. The patient voices understanding of current disease status and treatment options and is in agreement with the current care plan. All questions were answered. The patient knows to call the clinic with any problems, questions or concerns. We can certainly see the patient much sooner if necessary     Orders Placed This Encounter  Procedures   CBC with Differential ([facility] Only)    Standing Status:   Future    Standing Expiration Date:   06/03/2023   CMP ([facility] only)    Standing Status:   Future    Standing Expiration Date:   06/03/2023     The total time spent in the appointment was 30-39 minutes.   Roger Harwick L Damier Disano, PA-C 05/31/22

## 2022-05-24 ENCOUNTER — Telehealth: Payer: Self-pay

## 2022-05-24 ENCOUNTER — Ambulatory Visit (HOSPITAL_COMMUNITY)
Admission: RE | Admit: 2022-05-24 | Discharge: 2022-05-24 | Disposition: A | Discharge status: Home / Self Care | Payer: BC Managed Care – PPO | Source: Ambulatory Visit | Attending: Radiation Oncology | Admitting: Radiation Oncology

## 2022-05-24 ENCOUNTER — Other Ambulatory Visit: Payer: Self-pay | Admitting: Physician Assistant

## 2022-05-24 ENCOUNTER — Other Ambulatory Visit: Payer: BC Managed Care – PPO

## 2022-05-24 ENCOUNTER — Inpatient Hospital Stay: Payer: BC Managed Care – PPO

## 2022-05-24 ENCOUNTER — Ambulatory Visit: Payer: BC Managed Care – PPO

## 2022-05-24 ENCOUNTER — Inpatient Hospital Stay: Payer: BC Managed Care – PPO | Admitting: Dietician

## 2022-05-24 DIAGNOSIS — R7989 Other specified abnormal findings of blood chemistry: Secondary | ICD-10-CM

## 2022-05-24 DIAGNOSIS — R609 Edema, unspecified: Secondary | ICD-10-CM

## 2022-05-24 DIAGNOSIS — I8290 Acute embolism and thrombosis of unspecified vein: Secondary | ICD-10-CM

## 2022-05-24 DIAGNOSIS — C3481 Malignant neoplasm of overlapping sites of right bronchus and lung: Secondary | ICD-10-CM | POA: Diagnosis not present

## 2022-05-24 DIAGNOSIS — E876 Hypokalemia: Secondary | ICD-10-CM

## 2022-05-24 LAB — CBC WITH DIFFERENTIAL (CANCER CENTER ONLY)
Abs Immature Granulocytes: 0.14 10*3/uL — ABNORMAL HIGH (ref 0.00–0.07)
Basophils Absolute: 0.1 10*3/uL (ref 0.0–0.1)
Basophils Relative: 1 %
Eosinophils Absolute: 0 10*3/uL (ref 0.0–0.5)
Eosinophils Relative: 0 %
HCT: 34.1 % — ABNORMAL LOW (ref 39.0–52.0)
Hemoglobin: 11 g/dL — ABNORMAL LOW (ref 13.0–17.0)
Immature Granulocytes: 1 %
Lymphocytes Relative: 7 %
Lymphs Abs: 1.2 10*3/uL (ref 0.7–4.0)
MCH: 29.5 pg (ref 26.0–34.0)
MCHC: 32.3 g/dL (ref 30.0–36.0)
MCV: 91.4 fL (ref 80.0–100.0)
Monocytes Absolute: 0.6 10*3/uL (ref 0.1–1.0)
Monocytes Relative: 4 %
Neutro Abs: 14.6 10*3/uL — ABNORMAL HIGH (ref 1.7–7.7)
Neutrophils Relative %: 87 %
Platelet Count: 137 10*3/uL — ABNORMAL LOW (ref 150–400)
RBC: 3.73 MIL/uL — ABNORMAL LOW (ref 4.22–5.81)
RDW: 15.9 % — ABNORMAL HIGH (ref 11.5–15.5)
WBC Count: 16.7 10*3/uL — ABNORMAL HIGH (ref 4.0–10.5)
nRBC: 0 % (ref 0.0–0.2)

## 2022-05-24 LAB — CMP (CANCER CENTER ONLY)
ALT: 85 U/L — ABNORMAL HIGH (ref 0–44)
AST: 24 U/L (ref 15–41)
Albumin: 3.3 g/dL — ABNORMAL LOW (ref 3.5–5.0)
Alkaline Phosphatase: 144 U/L — ABNORMAL HIGH (ref 38–126)
Anion gap: 6 (ref 5–15)
BUN: 9 mg/dL (ref 8–23)
CO2: 26 mmol/L (ref 22–32)
Calcium: 8.5 mg/dL — ABNORMAL LOW (ref 8.9–10.3)
Chloride: 104 mmol/L (ref 98–111)
Creatinine: 0.63 mg/dL (ref 0.61–1.24)
GFR, Estimated: >60 mL/min (ref >60)
Glucose, Bld: 144 mg/dL — ABNORMAL HIGH (ref 70–99)
Potassium: 3.2 mmol/L — ABNORMAL LOW (ref 3.5–5.1)
Sodium: 136 mmol/L (ref 135–145)
Total Bilirubin: 0.8 mg/dL (ref 0.3–1.2)
Total Protein: 6.7 g/dL (ref 6.5–8.1)

## 2022-05-24 MED ORDER — POTASSIUM CHLORIDE CRYS ER 20 MEQ PO TBCR: 20.0000 meq | EXTENDED_RELEASE_TABLET | Freq: Every day | ORAL | 0 refills | Status: DC

## 2022-05-24 MED ORDER — APIXABAN 5 MG PO TABS: ORAL_TABLET | ORAL | 0 refills | Status: DC

## 2022-05-24 NOTE — Progress Notes (Signed)
Nutrition Assessment   Reason for Assessment: Referral (poor appetite)   ASSESSMENT: 66 year old male with stage IV non-small cell lung cancer. He is currently receiving chemotherapy with carboplatin, paclitaxel and keytruda with neulasta q3w (first 11/6). Patient is receiving palliative radiation to abdominal lesion under the care of Dr. Sondra Come and is s/p CT SIM on 11/20. Patient is fillowed by Dr. Julien Nordmann.   Past medical history includes COPD, HTN, depression, anxiety  Patient arrived in wheelchair from vascular for evaluation of left lower extremity swelling. Patient states he was informed of new small blood clot in lower leg. He reports active lifestyle up until a few months ago when he stopped walking. Says he would walk 6-7 miles everyday. Patient asking if clot related to decrease in activity or to treatment. Patient reports he is not a big eater at baseline. Recalls 3 smaller meals/day. Breakfast is bowl of cheerios, banana, 2% fairlife milk. Patient usually has a sandwich for lunch (bologna, cheese, mayo) with chips. Last night pt had a turkey/cheese sandwich and potato salad for supper. Patient is drinking 5 bottles of water. He enjoys sprite zero and has ~3 of these daily. Patient says he has a sweet tooth. He likes grapes, strawberries, and cakes. Patient has decreased intake of sweets recently as PCP advised due to elevated blood sugars. He started taking metformin ~6 months ago, but says he has not been told he has DM. Patient recalls tolerating first treatment fairly well. He had some diarrhea and fatigue. He would like to know how to improve the fatigue as he was unable to get out of bed for ~3 days.    Nutrition Focused Physical Exam:   Orbital Region: moderate Buccal Region: severe Upper Arm Region: Therapist, art and Lumbar Region: uta Temple Region: mild Clavicle Bone Region: moderate Shoulder and Acromion Bone Region: uta Scapular Bone Region: uta Dorsal Hand:  moderate Patellar Region: uta Anterior Thigh Region: uta Posterior Calf Region: uta Edema (RD assessment): uta Hair: reviewed Eyes: reviewed  Mouth: reviewed (poor dentition)  Skin: reviewed  Nails: reviewed    Medications: xanax, eliquis, lipitor, metformin, medrol dosepak, remeron, compazine   Labs: Na 134, K 3.3, glucose 263, BUN 30, Ca 8.7 03/31/22  HgbA1c - 7.0  Anthropometrics:   Height: 5'11" Weight: 158 lb 14.4 oz UBW: 170 lb (August) BMI: 22.16   NUTRITION DIAGNOSIS: Unintentional weight loss related to chronic illness (COPD) and newly diagnosed stage IV lung cancer as evidenced by 7% (12 lb) decrease from usual weight in 3 months - concerning   INTERVENTION:  Discussed increased metabolic demand related to COPD/cancer and educated on increased calorie and protein energy intake to maintain weights/strength  Educated on snacks in between meals with focus on protein for better glucose control, offered ideas on balanced meals/snacks - handout with ideas provided  Discussed ways to add calories/protein to foods - pt will switch to fairlife whole milk and drink 2 glasses day (with lunch and at bedtime) Encouraged soft moist high protein foods for ease of intake - handout provided  Dr. Sondra Come to meet with patient prior to labs to discuss doppler results with pt Contact information given  MONITORING, EVALUATION, GOAL: Patient will tolerate increased calories and protein to prevent further weight loss   Next Visit: Tuesday December 19 during infusion

## 2022-05-24 NOTE — Telephone Encounter (Signed)
This nurse spoke with patient and made him aware that he has a DVT in his lower left leg.  Advised patient to stop taking Aspirin and Advil.  Made him aware that he cannot take any NSAIDS.  Made aware that the provider is going to send in a prescription for Eliquis.  Explaiend the directions to patient.  He acknowledged understanding and has no further questions at this time.

## 2022-05-24 NOTE — Progress Notes (Signed)
Left lower extremity venous duplex has been completed. Preliminary results can be found in CV Proc through chart review.  Results were given to Dr. Sondra Come.  05/24/22 10:54 AM Roger [name] RVT

## 2022-05-25 ENCOUNTER — Ambulatory Visit: Payer: BC Managed Care – PPO

## 2022-05-25 ENCOUNTER — Telehealth: Payer: Self-pay

## 2022-05-25 NOTE — Telephone Encounter (Signed)
This nurse reached out to this patient and left a message for related to his lab results and provider recommendations.  Also advised that the provider has called in Potassium tablets to his pharmacy at [facility].  Provided instructions of 1 tablet a day for 6 days.  No further concerns noted at this time.  Patient knows to call clinic if he has any questions or concerns.

## 2022-05-30 ENCOUNTER — Telehealth: Payer: Self-pay | Admitting: Medical Oncology

## 2022-05-30 ENCOUNTER — Ambulatory Visit: Payer: BC Managed Care – PPO

## 2022-05-30 MED FILL — Fosaprepitant Dimeglumine For IV Infusion 150 MG (Base Eq): INTRAVENOUS | Qty: 5 | Status: AC

## 2022-05-30 MED FILL — Dexamethasone Sodium Phosphate Inj 100 MG/10ML: INTRAMUSCULAR | Qty: 1 | Status: AC

## 2022-05-30 NOTE — Telephone Encounter (Signed)
Beverly asked for schedule because she is transporting pt. Information given. She said [facility] does not have a port.  Schedule message sent to change port flush with labs to labs.  Port a cath [facility] Pt contacted to see if he still wants a port. He declined.   He had a lot of questions about his medications.  He did not start his eloquis or medrol dose pak. I instructed him to start eloquis today and hold medrol dose pak.HE said he will stop ibuprofen and his aspirin.

## 2022-05-31 ENCOUNTER — Other Ambulatory Visit: Payer: Self-pay

## 2022-05-31 ENCOUNTER — Inpatient Hospital Stay (HOSPITAL_BASED_OUTPATIENT_CLINIC_OR_DEPARTMENT_OTHER): Payer: BC Managed Care – PPO | Admitting: Physician Assistant

## 2022-05-31 ENCOUNTER — Emergency Department (HOSPITAL_COMMUNITY): Payer: BC Managed Care – PPO

## 2022-05-31 ENCOUNTER — Inpatient Hospital Stay (HOSPITAL_COMMUNITY)
Admission: EM | Admit: 2022-05-31 | Discharge: 2022-06-03 | DRG: 175 | Disposition: A | Discharge status: Home / Self Care | Payer: BC Managed Care – PPO | Source: Ambulatory Visit | Attending: Internal Medicine | Admitting: Internal Medicine

## 2022-05-31 ENCOUNTER — Other Ambulatory Visit: Payer: BC Managed Care – PPO

## 2022-05-31 ENCOUNTER — Inpatient Hospital Stay: Payer: BC Managed Care – PPO

## 2022-05-31 ENCOUNTER — Telehealth: Payer: Self-pay | Admitting: Medical Oncology

## 2022-05-31 ENCOUNTER — Ambulatory Visit: Payer: BC Managed Care – PPO

## 2022-05-31 VITALS — BP 122/98 | HR 81 | Temp 98.2°F | Resp 17 | Wt 166.1 lb

## 2022-05-31 DIAGNOSIS — Z800 Family history of malignant neoplasm of digestive organs: Secondary | ICD-10-CM

## 2022-05-31 DIAGNOSIS — C3412 Malignant neoplasm of upper lobe, left bronchus or lung: Secondary | ICD-10-CM | POA: Diagnosis present

## 2022-05-31 DIAGNOSIS — I2699 Other pulmonary embolism without acute cor pulmonale: Secondary | ICD-10-CM | POA: Diagnosis not present

## 2022-05-31 DIAGNOSIS — I82409 Acute embolism and thrombosis of unspecified deep veins of unspecified lower extremity: Secondary | ICD-10-CM | POA: Insufficient documentation

## 2022-05-31 DIAGNOSIS — Z801 Family history of malignant neoplasm of trachea, bronchus and lung: Secondary | ICD-10-CM

## 2022-05-31 DIAGNOSIS — I2609 Other pulmonary embolism with acute cor pulmonale: Principal | ICD-10-CM | POA: Diagnosis present

## 2022-05-31 DIAGNOSIS — C3492 Malignant neoplasm of unspecified part of left bronchus or lung: Secondary | ICD-10-CM | POA: Diagnosis not present

## 2022-05-31 DIAGNOSIS — Z79899 Other long term (current) drug therapy: Secondary | ICD-10-CM

## 2022-05-31 DIAGNOSIS — Z7982 Long term (current) use of aspirin: Secondary | ICD-10-CM

## 2022-05-31 DIAGNOSIS — I4891 Unspecified atrial fibrillation: Secondary | ICD-10-CM | POA: Diagnosis present

## 2022-05-31 DIAGNOSIS — Z885 Allergy status to narcotic agent status: Secondary | ICD-10-CM

## 2022-05-31 DIAGNOSIS — R0902 Hypoxemia: Secondary | ICD-10-CM | POA: Diagnosis present

## 2022-05-31 DIAGNOSIS — Z1152 Encounter for screening for COVID-19: Secondary | ICD-10-CM

## 2022-05-31 DIAGNOSIS — F411 Generalized anxiety disorder: Secondary | ICD-10-CM | POA: Diagnosis present

## 2022-05-31 DIAGNOSIS — J449 Chronic obstructive pulmonary disease, unspecified: Secondary | ICD-10-CM | POA: Diagnosis present

## 2022-05-31 DIAGNOSIS — Z881 Allergy status to other antibiotic agents status: Secondary | ICD-10-CM

## 2022-05-31 DIAGNOSIS — Z7901 Long term (current) use of anticoagulants: Secondary | ICD-10-CM

## 2022-05-31 DIAGNOSIS — I10 Essential (primary) hypertension: Secondary | ICD-10-CM | POA: Diagnosis present

## 2022-05-31 DIAGNOSIS — E785 Hyperlipidemia, unspecified: Secondary | ICD-10-CM | POA: Diagnosis present

## 2022-05-31 DIAGNOSIS — Z86718 Personal history of other venous thrombosis and embolism: Secondary | ICD-10-CM

## 2022-05-31 DIAGNOSIS — Z818 Family history of other mental and behavioral disorders: Secondary | ICD-10-CM

## 2022-05-31 DIAGNOSIS — R739 Hyperglycemia, unspecified: Secondary | ICD-10-CM | POA: Insufficient documentation

## 2022-05-31 DIAGNOSIS — F17210 Nicotine dependence, cigarettes, uncomplicated: Secondary | ICD-10-CM | POA: Diagnosis present

## 2022-05-31 DIAGNOSIS — Z7984 Long term (current) use of oral hypoglycemic drugs: Secondary | ICD-10-CM

## 2022-05-31 DIAGNOSIS — J4489 Other specified chronic obstructive pulmonary disease: Secondary | ICD-10-CM | POA: Diagnosis present

## 2022-05-31 DIAGNOSIS — Z882 Allergy status to sulfonamides status: Secondary | ICD-10-CM

## 2022-05-31 DIAGNOSIS — Z888 Allergy status to other drugs, medicaments and biological substances status: Secondary | ICD-10-CM

## 2022-05-31 DIAGNOSIS — C7951 Secondary malignant neoplasm of bone: Secondary | ICD-10-CM | POA: Diagnosis present

## 2022-05-31 LAB — CMP (CANCER CENTER ONLY)
ALT: 23 U/L (ref 0–44)
AST: 17 U/L (ref 15–41)
Albumin: 3.5 g/dL (ref 3.5–5.0)
Alkaline Phosphatase: 145 U/L — ABNORMAL HIGH (ref 38–126)
Anion gap: 8 (ref 5–15)
BUN: 13 mg/dL (ref 8–23)
CO2: 26 mmol/L (ref 22–32)
Calcium: 9.1 mg/dL (ref 8.9–10.3)
Chloride: 104 mmol/L (ref 98–111)
Creatinine: 0.87 mg/dL (ref 0.61–1.24)
GFR, Estimated: >60 mL/min (ref >60)
Glucose, Bld: 186 mg/dL — ABNORMAL HIGH (ref 70–99)
Potassium: 3.3 mmol/L — ABNORMAL LOW (ref 3.5–5.1)
Sodium: 138 mmol/L (ref 135–145)
Total Bilirubin: 0.8 mg/dL (ref 0.3–1.2)
Total Protein: 7.6 g/dL (ref 6.5–8.1)

## 2022-05-31 LAB — BRAIN NATRIURETIC PEPTIDE: B Natriuretic Peptide: 1245.2 pg/mL — ABNORMAL HIGH (ref 0.0–100.0)

## 2022-05-31 LAB — CBC WITH DIFFERENTIAL (CANCER CENTER ONLY)
Abs Immature Granulocytes: 0.03 K/uL (ref 0.00–0.07)
Basophils Absolute: 0.1 K/uL (ref 0.0–0.1)
Basophils Relative: 1 %
Eosinophils Absolute: 0 K/uL (ref 0.0–0.5)
Eosinophils Relative: 0 %
HCT: 36.7 % — ABNORMAL LOW (ref 39.0–52.0)
Hemoglobin: 11.5 g/dL — ABNORMAL LOW (ref 13.0–17.0)
Immature Granulocytes: 0 %
Lymphocytes Relative: 12 %
Lymphs Abs: 1.1 K/uL (ref 0.7–4.0)
MCH: 29.3 pg (ref 26.0–34.0)
MCHC: 31.3 g/dL (ref 30.0–36.0)
MCV: 93.4 fL (ref 80.0–100.0)
Monocytes Absolute: 0.5 K/uL (ref 0.1–1.0)
Monocytes Relative: 5 %
Neutro Abs: 7.2 K/uL (ref 1.7–7.7)
Neutrophils Relative %: 82 %
Platelet Count: 157 K/uL (ref 150–400)
RBC: 3.93 MIL/uL — ABNORMAL LOW (ref 4.22–5.81)
RDW: 16.5 % — ABNORMAL HIGH (ref 11.5–15.5)
WBC Count: 8.8 K/uL (ref 4.0–10.5)
nRBC: 0 % (ref 0.0–0.2)

## 2022-05-31 LAB — CBC
HCT: 36.1 % — ABNORMAL LOW (ref 39.0–52.0)
Hemoglobin: 11.1 g/dL — ABNORMAL LOW (ref 13.0–17.0)
MCH: 29.1 pg (ref 26.0–34.0)
MCHC: 30.7 g/dL (ref 30.0–36.0)
MCV: 94.5 fL (ref 80.0–100.0)
Platelets: 146 10*3/uL — ABNORMAL LOW (ref 150–400)
RBC: 3.82 MIL/uL — ABNORMAL LOW (ref 4.22–5.81)
RDW: 17 % — ABNORMAL HIGH (ref 11.5–15.5)
WBC: 8.5 10*3/uL (ref 4.0–10.5)
nRBC: 0 % (ref 0.0–0.2)

## 2022-05-31 LAB — COMPREHENSIVE METABOLIC PANEL
ALT: 27 U/L (ref 0–44)
AST: 22 U/L (ref 15–41)
Albumin: 2.9 g/dL — ABNORMAL LOW (ref 3.5–5.0)
Alkaline Phosphatase: 128 U/L — ABNORMAL HIGH (ref 38–126)
Anion gap: 10 (ref 5–15)
BUN: 12 mg/dL (ref 8–23)
CO2: 25 mmol/L (ref 22–32)
Calcium: 8.5 mg/dL — ABNORMAL LOW (ref 8.9–10.3)
Chloride: 104 mmol/L (ref 98–111)
Creatinine, Ser: 0.8 mg/dL (ref 0.61–1.24)
GFR, Estimated: >60 mL/min (ref >60)
Glucose, Bld: 144 mg/dL — ABNORMAL HIGH (ref 70–99)
Potassium: 3.6 mmol/L (ref 3.5–5.1)
Sodium: 139 mmol/L (ref 135–145)
Total Bilirubin: 0.8 mg/dL (ref 0.3–1.2)
Total Protein: 7.2 g/dL (ref 6.5–8.1)

## 2022-05-31 LAB — RESP PANEL BY RT-PCR (FLU A&B, COVID) ARPGX2
Influenza A by PCR: NEGATIVE
Influenza B by PCR: NEGATIVE
SARS Coronavirus 2 by RT PCR: NEGATIVE

## 2022-05-31 LAB — APTT: aPTT: 37 seconds — ABNORMAL HIGH (ref 24–36)

## 2022-05-31 LAB — HEPARIN LEVEL (UNFRACTIONATED): Heparin Unfractionated: >1.1 IU/mL — ABNORMAL HIGH (ref 0.30–0.70)

## 2022-05-31 LAB — TROPONIN I (HIGH SENSITIVITY)
Troponin I (High Sensitivity): 16 ng/L (ref <18)
Troponin I (High Sensitivity): 16 ng/L (ref <18)

## 2022-05-31 MED ORDER — IOHEXOL 350 MG/ML SOLN
75.0000 mL | Freq: Once | INTRAVENOUS | Status: AC | PRN
  Administered 2022-05-31: 75 mL via INTRAVENOUS

## 2022-05-31 MED ORDER — ACETAMINOPHEN 650 MG RE SUPP: 650.0000 mg | Freq: Four times a day (QID) | RECTAL | Status: DC | PRN

## 2022-05-31 MED ORDER — IPRATROPIUM-ALBUTEROL 0.5-2.5 (3) MG/3ML IN SOLN: 3.0000 mL | Freq: Four times a day (QID) | RESPIRATORY_TRACT | Status: DC | PRN

## 2022-05-31 MED ORDER — SODIUM CHLORIDE (PF) 0.9 % IJ SOLN
INTRAMUSCULAR | Status: AC
  Filled 2022-05-31: qty 50

## 2022-05-31 MED ORDER — ONDANSETRON HCL 4 MG PO TABS: 4.0000 mg | ORAL_TABLET | Freq: Four times a day (QID) | ORAL | Status: DC | PRN

## 2022-05-31 MED ORDER — ONDANSETRON HCL 4 MG/2ML IJ SOLN: 4.0000 mg | Freq: Four times a day (QID) | INTRAMUSCULAR | Status: DC | PRN

## 2022-05-31 MED ORDER — HEPARIN (PORCINE) 25000 UT/250ML-% IV SOLN
1500.0000 [IU]/h | INTRAVENOUS | Status: DC
  Administered 2022-05-31 – 2022-06-02 (×3): 1350 [IU]/h via INTRAVENOUS
  Administered 2022-06-03: 1500 [IU]/h via INTRAVENOUS
  Filled 2022-05-31 (×4): qty 250

## 2022-05-31 MED ORDER — ACETAMINOPHEN 325 MG PO TABS: 650.0000 mg | ORAL_TABLET | Freq: Four times a day (QID) | ORAL | Status: DC | PRN

## 2022-05-31 NOTE — H&P (Signed)
History and Physical    Patient: Roger [name] KGY:[id number] DOB: February 17, 1956 DOA: 05/31/2022 DOS: the patient was seen and examined on 05/31/2022 PCP: Aletha Halim., PA-C  [facility]  Chief Complaint: Hypoxia  HPI: Roger [name] is a 66 y.o. male with medical history significant of SCC of left lung, HTN, COPD, anxiety, DVT, HLD. Presenting with hypoxia. He was in his normal state of health this morning when [facility]. In his pre-evaluation, it was noted that he was hypoxic into the low 80s. He is not normally on supplemental O2. They cancelled his infusion and recommended that he go to the ED for evaluation. He denies any chest pain, palpitations, dyspnea, lightheadedness or dizziness. Of note, he was diagnosed w/ a LLE DVT last week. He was supposed to start an eliquis regimen, but did not take his first dose until this morning. He denies any aggravating or alleviating factors.   Review of Systems: As mentioned in the history of present illness. All other systems reviewed and are negative. Past Medical History:  Diagnosis Date   Anxiety    Asthma    COPD (chronic obstructive pulmonary disease) ([place])    Depression    Hemorrhoids    History of rectal bleeding    Hypertension    Palpitations    Pneumonia    in 2015   Post-operative nausea and vomiting    pt unsure   Primary squamous cell carcinoma of left lung ([place]) 05/03/2022   Past Surgical History:  Procedure Laterality Date   DENTAL SURGERY     MASS EXCISION Right 04/20/2022   Procedure: NEEDLE BIOPSY OF CHEST WALL MASS RIGHT LOWER CHEST WALL;  Surgeon: [name], MD;  Location: [facility];  Service: Thoracic;  Laterality: Right;   NOSE SURGERY     SKIN CANCER EXCISION     left forehead   TONSILLECTOMY     VIDEO BRONCHOSCOPY WITH ENDOBRONCHIAL NAVIGATION N/A 04/20/2022   Procedure: VIDEO BRONCHOSCOPY WITH ENDOBRONCHIAL NAVIGATION;  Surgeon: [name], MD;   Location: [facility];  Service: Thoracic;  Laterality: N/A;   Social History:  reports that he has quit smoking. His smoking use included cigarettes. He has a 12.50 pack-year smoking history. He has never used smokeless tobacco. He reports that he does not drink alcohol and does not use drugs.  Allergies  Allergen Reactions   Demerol Palpitations and Other (See Comments)    Heart beats fast   Erythromycin Nausea And Vomiting   Prednisone Other (See Comments)    "felt weird"   Sulfa Antibiotics Nausea And Vomiting and Other (See Comments)    hematoemesis   Augmentin [Amoxicillin-Pot Clavulanate] Nausea And Vomiting    Family History  Problem Relation Age of Onset   Anxiety disorder Mother    Depression Mother    Lung disease Mother        "arthritic lung"   Anxiety disorder Sister    Depression Sister    Anxiety disorder Brother    Lung disease Father    Lung cancer Father    Anxiety disorder Sister    Anxiety disorder Brother    Anxiety disorder Brother    Anxiety disorder Brother    Colon cancer Brother     Prior to Admission medications   Medication Sig Start Date End Date Taking? Authorizing Provider  potassium chloride SA (KLOR-CON M) 20 MEQ tablet Take 1 tablet (20 mEq total) by  mouth daily. 05/24/22   Heilingoetter, Cassandra L, PA-C  albuterol (PROVENTIL HFA;VENTOLIN HFA) 108 (90 Base) MCG/ACT inhaler Inhale 2 puffs into the lungs every 6 (six) hours as needed for wheezing or shortness of breath.    [provider]  ALPRAZolam Duanne Moron) 1 MG tablet Take 1 mg by mouth 5 (five) times daily.    [provider]  apixaban (ELIQUIS) 5 MG TABS tablet Take 2 tablets (10mg ) twice daily for 7 days, then 1 tablet (5mg ) twice daily 05/24/22   Heilingoetter, Cassandra L, PA-C  atenolol (TENORMIN) 50 MG tablet Take 50 mg by mouth daily.     [provider]  atorvastatin (LIPITOR) 10 MG tablet Take 10 mg by mouth daily at 6 PM.     [provider]   fluticasone (FLONASE) 50 MCG/ACT nasal spray Place 2 sprays into both nostrils daily. 01/13/22   [provider]  lidocaine-prilocaine (EMLA) cream Apply to the Port-A-Cath site 30-60 minutes before treatment. 05/03/22   Curt Bears, MD  metFORMIN (GLUCOPHAGE-XR) 500 MG 24 hr tablet Take 500 mg by mouth daily with breakfast. 03/20/22   [provider]  methylPREDNISolone (MEDROL DOSEPAK) 4 MG TBPK tablet Use as instructed 05/17/22   Heilingoetter, Cassandra L, PA-C  mirtazapine (REMERON) 15 MG tablet Take 15 mg by mouth at bedtime.    [provider]  prochlorperazine (COMPAZINE) 10 MG tablet Take 1 tablet (10 mg total) by mouth every 6 (six) hours as needed for nausea or vomiting. 05/03/22   Curt Bears, MD    Physical Exam: Vitals:   05/31/22 0954 05/31/22 0955 05/31/22 1100 05/31/22 1105  BP: 134/76  (!) 122/95   Pulse: 71  65   Resp: 18  20   Temp: 97.9 F (36.6 C)     TempSrc: Oral     SpO2: (!) 89%  98% 96%  Weight:  75 kg    Height:  5\' 11"  (1.803 m)     General: 66 y.o. male resting in bed in NAD Eyes: PERRL, normal sclera ENMT: Nares patent w/o discharge, orophaynx clear, dentition normal, ears w/o discharge/lesions/ulcers Neck: Supple, trachea midline Cardiovascular: RRR, +S1, S2, no m/g/r, equal pulses throughout Respiratory: CTABL, no w/r/r, normal WOB GI: BS+, NDNT, no masses noted, no organomegaly noted MSK: No c/c; LLE edema Neuro: A&O x 3, no focal deficits Psyc: Appropriate interaction and affect, calm/cooperative  Data Reviewed:  Results for orders placed or performed during the hospital encounter of 05/31/22 (from the past 24 hour(s))  Resp Panel by RT-PCR (Flu A&B, Covid) Anterior Nasal Swab     Status: None   Collection Time: 05/31/22 11:05 AM   Specimen: Anterior Nasal Swab  Result Value Ref Range   SARS Coronavirus 2 by RT PCR NEGATIVE NEGATIVE   Influenza A by PCR NEGATIVE NEGATIVE   Influenza B by PCR NEGATIVE  NEGATIVE  Comprehensive metabolic panel     Status: Abnormal   Collection Time: 05/31/22 11:05 AM  Result Value Ref Range   Sodium 139 135 - 145 mmol/L   Potassium 3.6 3.5 - 5.1 mmol/L   Chloride 104 98 - 111 mmol/L   CO2 25 22 - 32 mmol/L   Glucose, Bld 144 (H) 70 - 99 mg/dL   BUN 12 8 - 23 mg/dL   Creatinine, Ser 0.80 0.61 - 1.24 mg/dL   Calcium 8.5 (L) 8.9 - 10.3 mg/dL   Total Protein 7.2 6.5 - 8.1 g/dL   Albumin 2.9 (L) 3.5 - 5.0 g/dL  AST 22 15 - 41 U/L   ALT 27 0 - 44 U/L   Alkaline Phosphatase 128 (H) 38 - 126 U/L   Total Bilirubin 0.8 0.3 - 1.2 mg/dL   GFR, Estimated >60 >60 mL/min   Anion gap 10 5 - 15  CBC     Status: Abnormal   Collection Time: 05/31/22 11:05 AM  Result Value Ref Range   WBC 8.5 4.0 - 10.5 K/uL   RBC 3.82 (L) 4.22 - 5.81 MIL/uL   Hemoglobin 11.1 (L) 13.0 - 17.0 g/dL   HCT 36.1 (L) 39.0 - 52.0 %   MCV 94.5 80.0 - 100.0 fL   MCH 29.1 26.0 - 34.0 pg   MCHC 30.7 30.0 - 36.0 g/dL   RDW 17.0 (H) 11.5 - 15.5 %   Platelets 146 (L) 150 - 400 K/uL   nRBC 0.0 0.0 - 0.2 %   CXR: Left upper lobe lung mass appears slightly increased in size compared to the prior radiograph. Additional left lower lobe lung mass and right lower lobe nodules are not appreciably changed.  CTA Chest: 1. Acute pulmonary emboli within the lobar and segmental pulmonary arteries of the right middle and lower lobes. The embolic burden is moderate. Positive for acute PE with CT evidence of right heart strain (RV/LV Ratio = 1.4 ) consistent with at least submassive (intermediate risk) PE. The presence of right heart strain has been associated with an increased risk of morbidity and mortality. Please refer to the "Code PE Focused" order set in EPIC. 2. Severe emphysema. 3. Extensive coronary artery calcification. 4. Interval development of a small pericardial effusion and small left pleural effusion. 5. Interval development of mildly progressive bibasilar ground-glass pulmonary  infiltrate which are favored to relate to mild superimposed alveolar pulmonary edema, given the associated findings. Additional differential considerations are as listed above, however. 6. Multiple pulmonary masses, progressive pathologic adenopathy, osseous, and body wall metastases again identified in keeping with the patient's known stage IV bronchogenic neoplasm Aortic Atherosclerosis (ICD10-I70.0) and Emphysema (ICD10-J43.9).  Assessment and Plan: Acute PE LLE DVT     - place in obs, tele     - heparin gtt     - imaging as above     - check echo; if negative, can resume eliquis     - 6MWT when back on eliquis as he will probably need home O2 for a short time  SCC of left lung     - continue outpt follow up with Dr. Earlie Server  HTN     - BP is soft right now; hold home regimen  COPD     - continue home regimen when confirmed  Anxiety     - continue home regimen when confirmed  HLD     - continue home regimen when confirmed  Hyperglycemia     - check A1c  Tobacco abuse     - counsel against further use  Advance Care Planning:   Code Status: FULL  Consults: None  Family Communication: w/ wife at bedside  Severity of Illness: The appropriate patient status for this patient is OBSERVATION. Observation status is judged to be reasonable and necessary in order to provide the required intensity of service to ensure the patient's safety. The patient's presenting symptoms, physical exam findings, and initial radiographic and laboratory data in the context of their medical condition is felt to place them at decreased risk for further clinical deterioration. Furthermore, it is anticipated that the patient will be medically  stable for discharge from the hospital within 2 midnights of admission.   Author: Jonnie Finner, DO 05/31/2022 12:06 PM  For on call review [email or web address].

## 2022-05-31 NOTE — ED Notes (Signed)
Pt ambulated w/ 3L of O2. O2 stats dropped to 80 % after a minute of ambulating. RN notified.

## 2022-05-31 NOTE — Progress Notes (Signed)
ANTICOAGULATION CONSULT NOTE - Initial Consult  Pharmacy Consult for heparin Indication: pulmonary embolus and DVT  Allergies  Allergen Reactions   Demerol Palpitations and Other (See Comments)    Heart beats fast   Erythromycin Nausea And Vomiting   Prednisone Other (See Comments)    "felt weird"   Sulfa Antibiotics Nausea And Vomiting and Other (See Comments)    hematoemesis   Augmentin [Amoxicillin-Pot Clavulanate] Nausea And Vomiting    Patient Measurements: Height: 5\' 11"  (180.3 cm) Weight: 75 kg (165 lb 5.5 oz) IBW/kg (Calculated) : 75.3 Heparin Dosing Weight: n/a. Use TBW of 75 kg  Vital Signs: Temp: 97.9 F (36.6 C) (11/28 0954) Temp Source: Oral (11/28 0954) BP: 102/81 (11/28 1330) Pulse Rate: 65 (11/28 1330)  Labs: Recent Labs    05/31/22 0811 05/31/22 1105 05/31/22 1155 05/31/22 1308  HGB 11.5* 11.1*  --   --   HCT 36.7* 36.1*  --   --   PLT 157 146*  --   --   APTT  --   --   --  37*  HEPARINUNFRC  --   --   --  >1.10*  CREATININE 0.87 0.80  --   --   TROPONINIHS  --   --  16  --     Estimated Creatinine Clearance: 97.7 mL/min (by C-G formula based on SCr of 0.8 mg/dL).   Medical History: Past Medical History:  Diagnosis Date   Anxiety    Asthma    COPD (chronic obstructive pulmonary disease) ([place])    Depression    Hemorrhoids    History of rectal bleeding    Hypertension    Palpitations    Pneumonia    in 2015   Post-operative nausea and vomiting    pt unsure   Primary squamous cell carcinoma of left lung ([place]) 05/03/2022    Medications: Pt recently prescribed apixaban for DVT.  -Last dose: 11/28 ~0900  Assessment: Pt is a 12 yoM with PMH significant for lung cancer, COPD, anxiety, HTN. Pt was diagnosed with LLE DVT last week and prescribed apixaban. However, pt did not start taking apixaban until this morning (11/28). Pt was sent to the ED from the cancer center with hypoxia. CTA revealed acute PE consistent with at least submassive  PE with evidence of RHS.   I spoke with patient and confirmed that he took a 10 mg dose of apixaban ~0900 this morning (11/28).   Today, 05/31/22 Baseline HL is falsely elevated (consistent with recent dose of DOAC). Will monitor using aPTT for now.  CBC: Hgb (11.1) slightly low; Plt (146) slightly low  Goal of Therapy:  Heparin level 0.3-0.7 units/ml aPTT 66-102 seconds Monitor platelets by anticoagulation protocol: Yes   Plan:  Will initiate heparin drip without initial bolus ~12 hour after last dose of apixaban Initiate heparin infusion at 1350 units/hr @ 2000 this evening Check 6 hour aPTT level aPTT/HL, CBC daily while on heparin infusion. Once HL and aPTT correlate, can monitor using HL only. Monitor for signs of bleeding  Lenis Noon, PharmD 05/31/2022,1:54 PM

## 2022-05-31 NOTE — ED Triage Notes (Signed)
Pt sent over from cancer center pt noted to be hypoxic at 83%. Left leg dvt dx last week pt states took first dose of Eliquis this morning. Pt 89% on room air in triage. Hx lung cancer not normally on o2 at home.

## 2022-05-31 NOTE — Telephone Encounter (Signed)
Updated Betty on pts condition.

## 2022-05-31 NOTE — Progress Notes (Signed)
Provided pt with a written copy of chemotherapy/immunotherapy schedule to clarify his treatment. Wanted to make sure that pt understood that he is getting 4 cycles of chemotherapy with immunotherapy every 3 weeks, but that he will continue receiving the immunotherapy Beryle Flock) every 3 weeks for the next two years. Pt denied questions and endorsed understanding his treatment. Pt's sister-in-law, Lavan Imes, was also there for the discussion. I also reiterated the importance of and how to take his Eliquis as pt hadn't started it until this morning. Pt understood taking 2 tabs in the morning and evening for 1 week (11/28-12/4), then switching to 1 tab in the morning and evening indefinitely (12/5). Pt and his SIL denied questions at this time.

## 2022-05-31 NOTE — ED Provider Notes (Signed)
[facility] DEPT Provider Note   CSN: [id number] Arrival date & time: 05/31/22  [id number]     History {Add pertinent medical, surgical, social history, OB history to HPI:1} No chief complaint on file.   Roger [name] is a 66 y.o. male.  HPI Patient with a history of lung cancer, recently diagnosed, and after 1 session of chemotherapy today went for a second session was sent here due to hypoxia.  Patient notes history of COPD, continues to smoke cigarettes.  He does note that he is smoking diminishing amounts. He states that he typically has some hypoxia in the morning, this improves over the course the day.  No chart review evidence of that this.  Today he was found to be hypoxic at cancer center and sent here for evaluation.  He notes that he was recently diagnosed with DVT, took his first dose of Eliquis today, 1 week after that diagnosis.    Home Medications Prior to Admission medications   Medication Sig Start Date End Date Taking? Authorizing Provider  potassium chloride SA (KLOR-CON M) 20 MEQ tablet Take 1 tablet (20 mEq total) by mouth daily. 05/24/22   Heilingoetter, Cassandra L, PA-C  albuterol (PROVENTIL HFA;VENTOLIN HFA) 108 (90 Base) MCG/ACT inhaler Inhale 2 puffs into the lungs every 6 (six) hours as needed for wheezing or shortness of breath.    [provider]  ALPRAZolam Duanne Moron) 1 MG tablet Take 1 mg by mouth 5 (five) times daily.    [provider]  apixaban (ELIQUIS) 5 MG TABS tablet Take 2 tablets (38m) twice daily for 7 days, then 1 tablet (554m twice daily 05/24/22   Heilingoetter, Cassandra L, PA-C  atenolol (TENORMIN) 50 MG tablet Take 50 mg by mouth daily.     [provider]  atorvastatin (LIPITOR) 10 MG tablet Take 10 mg by mouth daily at 6 PM.     [provider]  fluticasone (FLONASE) 50 MCG/ACT nasal spray Place 2 sprays into both nostrils daily. 01/13/22   [provider]   lidocaine-prilocaine (EMLA) cream Apply to the Port-A-Cath site 30-60 minutes before treatment. 05/03/22   MoCurt BearsMD  metFORMIN (GLUCOPHAGE-XR) 500 MG 24 hr tablet Take 500 mg by mouth daily with breakfast. 03/20/22   [provider]  methylPREDNISolone (MEDROL DOSEPAK) 4 MG TBPK tablet Use as instructed 05/17/22   Heilingoetter, Cassandra L, PA-C  mirtazapine (REMERON) 15 MG tablet Take 15 mg by mouth at bedtime.    [provider]  prochlorperazine (COMPAZINE) 10 MG tablet Take 1 tablet (10 mg total) by mouth every 6 (six) hours as needed for nausea or vomiting. 05/03/22   MoCurt BearsMD      Allergies    Demerol, Erythromycin, Prednisone, Sulfa antibiotics, and Augmentin [amoxicillin-pot clavulanate]    Review of Systems   Review of Systems  All other systems reviewed and are negative.   Physical Exam Updated Vital Signs BP 134/76 (BP Location: Right Arm)   Pulse 71   Temp 97.9 F (36.6 C) (Oral)   Resp 18   Ht _0  (1.803 m)   Wt 75 kg   SpO2 (!) 89%   BMI 23.06 kg/m  Physical Exam Vitals and nursing note reviewed.  Constitutional:      General: He is not in acute distress.    Appearance: He is well-developed.  HENT:     Head: Normocephalic and atraumatic.  Eyes:     Conjunctiva/sclera: Conjunctivae normal.  Cardiovascular:  Rate and Rhythm: Regular rhythm.  Pulmonary:     Effort: No respiratory distress.     Breath sounds: No stridor.  Abdominal:     General: There is no distension.  Skin:    General: Skin is warm and dry.  Neurological:     Mental Status: He is alert and oriented to person, place, and time.     ED Results / Procedures / Treatments   Labs (all labs ordered are listed, but only abnormal results are displayed) Labs Reviewed  RESP PANEL BY RT-PCR (FLU A&B, COVID) [id number]  COMPREHENSIVE METABOLIC PANEL  CBC    EKG None  Radiology No results found.  Procedures Procedures  {Document cardiac  monitor, telemetry assessment procedure when appropriate:1}  Medications Ordered in ED Medications - No data to display  ED Course/ Medical Decision Making/ A&P This patient with a Hx of lung cancer, COPD, DVT presents to the ED for concern of hypoxia, this involves an extensive number of treatment options, and is a complaint that carries with it a high risk of complications and morbidity.    The differential diagnosis includes COPD exacerbation, PE, progression of lung cancer   Social Determinants of Health:  Age, cancer, cigarette addiction  Additional history obtained:  Additional history and/or information obtained from daughter-in-law at bedside for HPI, and chart review notable with oncology summary as below: DIAGNOSIS: Stage IV (T3, N3, M1 C) non-small cell lung cancer, squamous cell carcinoma presented with bilateral pulmonary masses and nodules as well as mediastinal and bilateral hilar adenopathy in addition to subcutaneous metastatic disease and bone metastasis diagnosed in October 2023 with PD-L1 expression of 1%.   After the initial evaluation, orders, including: *** were initiated.   Patient placed on Cardiac and Pulse-Oximetry Monitors. The patient was maintained on a cardiac monitor.  The cardiac monitored showed an rhythm of *** The patient was also maintained on pulse oximetry. The readings were typically ***   On repeat evaluation of the patient {resolved/improved/worsened:23923::"improved"}  Lab Tests:  I personally interpreted labs.  The pertinent results include:  ***  Imaging Studies ordered:  I independently visualized and interpreted imaging which showed *** I agree with the radiologist interpretation  Consultations Obtained:  I requested consultation with the ***,  and discussed lab and imaging findings as well as pertinent plan - they recommend: ***  Dispostion / Final MDM:  After consideration of the diagnostic results and the patient's  response to treatment, ***  Final Clinical Impression(s) / ED Diagnoses Final diagnoses:  None    Rx / DC Orders ED Discharge Orders     None

## 2022-05-31 NOTE — Progress Notes (Signed)
Pt transported to [facility] in wheelchair with 2 liters of oxygen.

## 2022-06-01 ENCOUNTER — Ambulatory Visit: Payer: BC Managed Care – PPO

## 2022-06-01 ENCOUNTER — Encounter (HOSPITAL_COMMUNITY): Payer: Self-pay | Admitting: Internal Medicine

## 2022-06-01 ENCOUNTER — Observation Stay (HOSPITAL_COMMUNITY): Payer: BC Managed Care – PPO

## 2022-06-01 ENCOUNTER — Inpatient Hospital Stay (HOSPITAL_COMMUNITY): Payer: BC Managed Care – PPO

## 2022-06-01 DIAGNOSIS — Z7984 Long term (current) use of oral hypoglycemic drugs: Secondary | ICD-10-CM | POA: Diagnosis not present

## 2022-06-01 DIAGNOSIS — J4489 Other specified chronic obstructive pulmonary disease: Secondary | ICD-10-CM | POA: Diagnosis present

## 2022-06-01 DIAGNOSIS — I2609 Other pulmonary embolism with acute cor pulmonale: Secondary | ICD-10-CM | POA: Diagnosis not present

## 2022-06-01 DIAGNOSIS — Z881 Allergy status to other antibiotic agents status: Secondary | ICD-10-CM | POA: Diagnosis not present

## 2022-06-01 DIAGNOSIS — I4891 Unspecified atrial fibrillation: Secondary | ICD-10-CM | POA: Diagnosis present

## 2022-06-01 DIAGNOSIS — Z882 Allergy status to sulfonamides status: Secondary | ICD-10-CM | POA: Diagnosis not present

## 2022-06-01 DIAGNOSIS — Z1152 Encounter for screening for COVID-19: Secondary | ICD-10-CM | POA: Diagnosis not present

## 2022-06-01 DIAGNOSIS — I2693 Single subsegmental pulmonary embolism without acute cor pulmonale: Secondary | ICD-10-CM | POA: Diagnosis not present

## 2022-06-01 DIAGNOSIS — F17210 Nicotine dependence, cigarettes, uncomplicated: Secondary | ICD-10-CM | POA: Diagnosis present

## 2022-06-01 DIAGNOSIS — C7951 Secondary malignant neoplasm of bone: Secondary | ICD-10-CM | POA: Diagnosis present

## 2022-06-01 DIAGNOSIS — Z885 Allergy status to narcotic agent status: Secondary | ICD-10-CM | POA: Diagnosis not present

## 2022-06-01 DIAGNOSIS — Z79899 Other long term (current) drug therapy: Secondary | ICD-10-CM | POA: Diagnosis not present

## 2022-06-01 DIAGNOSIS — Z7901 Long term (current) use of anticoagulants: Secondary | ICD-10-CM | POA: Diagnosis not present

## 2022-06-01 DIAGNOSIS — E785 Hyperlipidemia, unspecified: Secondary | ICD-10-CM | POA: Diagnosis present

## 2022-06-01 DIAGNOSIS — Z7982 Long term (current) use of aspirin: Secondary | ICD-10-CM | POA: Diagnosis not present

## 2022-06-01 DIAGNOSIS — Z800 Family history of malignant neoplasm of digestive organs: Secondary | ICD-10-CM | POA: Diagnosis not present

## 2022-06-01 DIAGNOSIS — Z818 Family history of other mental and behavioral disorders: Secondary | ICD-10-CM | POA: Diagnosis not present

## 2022-06-01 DIAGNOSIS — Z86718 Personal history of other venous thrombosis and embolism: Secondary | ICD-10-CM | POA: Diagnosis not present

## 2022-06-01 DIAGNOSIS — Z801 Family history of malignant neoplasm of trachea, bronchus and lung: Secondary | ICD-10-CM | POA: Diagnosis not present

## 2022-06-01 DIAGNOSIS — Z888 Allergy status to other drugs, medicaments and biological substances status: Secondary | ICD-10-CM | POA: Diagnosis not present

## 2022-06-01 DIAGNOSIS — C3412 Malignant neoplasm of upper lobe, left bronchus or lung: Secondary | ICD-10-CM | POA: Diagnosis present

## 2022-06-01 DIAGNOSIS — R0902 Hypoxemia: Secondary | ICD-10-CM | POA: Diagnosis present

## 2022-06-01 DIAGNOSIS — F411 Generalized anxiety disorder: Secondary | ICD-10-CM | POA: Diagnosis present

## 2022-06-01 DIAGNOSIS — I10 Essential (primary) hypertension: Secondary | ICD-10-CM | POA: Diagnosis present

## 2022-06-01 DIAGNOSIS — R739 Hyperglycemia, unspecified: Secondary | ICD-10-CM | POA: Diagnosis present

## 2022-06-01 LAB — ECHOCARDIOGRAM COMPLETE
AR max vel: 2.81 cm2
AV Area VTI: 2.83 cm2
AV Area mean vel: 2.7 cm2
AV Mean grad: 3 mmHg
AV Peak grad: 5.1 mmHg
Ao pk vel: 1.13 m/s
Area-P 1/2: 4.6 cm2
Calc EF: 49.4 %
Height: 71 in
MV M vel: 3.54 m/s
MV Peak grad: 50.1 mmHg
S' Lateral: 3.5 cm
Single Plane A2C EF: 44.6 %
Single Plane A4C EF: 55.6 %
Weight: 2645.52 oz

## 2022-06-01 LAB — COMPREHENSIVE METABOLIC PANEL
ALT: 23 U/L (ref 0–44)
AST: 22 U/L (ref 15–41)
Albumin: 2.8 g/dL — ABNORMAL LOW (ref 3.5–5.0)
Alkaline Phosphatase: 127 U/L — ABNORMAL HIGH (ref 38–126)
Anion gap: 9 (ref 5–15)
BUN: 11 mg/dL (ref 8–23)
CO2: 25 mmol/L (ref 22–32)
Calcium: 8.6 mg/dL — ABNORMAL LOW (ref 8.9–10.3)
Chloride: 105 mmol/L (ref 98–111)
Creatinine, Ser: 0.86 mg/dL (ref 0.61–1.24)
GFR, Estimated: >60 mL/min (ref >60)
Glucose, Bld: 124 mg/dL — ABNORMAL HIGH (ref 70–99)
Potassium: 3.5 mmol/L (ref 3.5–5.1)
Sodium: 139 mmol/L (ref 135–145)
Total Bilirubin: 0.7 mg/dL (ref 0.3–1.2)
Total Protein: 6.8 g/dL (ref 6.5–8.1)

## 2022-06-01 LAB — CBC
HCT: 35.1 % — ABNORMAL LOW (ref 39.0–52.0)
Hemoglobin: 10.7 g/dL — ABNORMAL LOW (ref 13.0–17.0)
MCH: 28.8 pg (ref 26.0–34.0)
MCHC: 30.5 g/dL (ref 30.0–36.0)
MCV: 94.6 fL (ref 80.0–100.0)
Platelets: 135 10*3/uL — ABNORMAL LOW (ref 150–400)
RBC: 3.71 MIL/uL — ABNORMAL LOW (ref 4.22–5.81)
RDW: 16.9 % — ABNORMAL HIGH (ref 11.5–15.5)
WBC: 8.4 10*3/uL (ref 4.0–10.5)
nRBC: 0 % (ref 0.0–0.2)

## 2022-06-01 LAB — HEPARIN LEVEL (UNFRACTIONATED): Heparin Unfractionated: >1.1 IU/mL — ABNORMAL HIGH (ref 0.30–0.70)

## 2022-06-01 LAB — APTT
aPTT: 68 seconds — ABNORMAL HIGH (ref 24–36)
aPTT: 74 seconds — ABNORMAL HIGH (ref 24–36)

## 2022-06-01 LAB — HIV ANTIBODY (ROUTINE TESTING W REFLEX): HIV Screen 4th Generation wRfx: NONREACTIVE

## 2022-06-01 MED ORDER — ATORVASTATIN CALCIUM 10 MG PO TABS: 10.0000 mg | ORAL_TABLET | Freq: Every evening | ORAL | Status: DC

## 2022-06-01 MED ORDER — ALPRAZOLAM 0.5 MG PO TABS
1.0000 mg | ORAL_TABLET | Freq: Every day | ORAL | Status: DC
  Administered 2022-06-01 – 2022-06-03 (×14): 1 mg via ORAL
  Filled 2022-06-01 (×14): qty 2

## 2022-06-01 MED ORDER — ATORVASTATIN CALCIUM 10 MG PO TABS
10.0000 mg | ORAL_TABLET | Freq: Every evening | ORAL | Status: DC
  Administered 2022-06-01 – 2022-06-03 (×4): 10 mg via ORAL
  Filled 2022-06-01 (×4): qty 1

## 2022-06-01 MED ORDER — MIRTAZAPINE 15 MG PO TABS
15.0000 mg | ORAL_TABLET | Freq: Every day | ORAL | Status: DC
  Administered 2022-06-01 – 2022-06-02 (×2): 15 mg via ORAL
  Filled 2022-06-01: qty 2
  Filled 2022-06-01: qty 1

## 2022-06-01 MED ORDER — ASPIRIN 81 MG PO TBEC
162.0000 mg | DELAYED_RELEASE_TABLET | Freq: Every day | ORAL | Status: DC
  Filled 2022-06-01: qty 2

## 2022-06-01 MED ORDER — MIRTAZAPINE 7.5 MG PO TABS: 15.0000 mg | ORAL_TABLET | Freq: Every day | ORAL | Status: DC

## 2022-06-01 NOTE — Progress Notes (Signed)
  Echocardiogram 2D Echocardiogram has been performed.  Roger [name] 06/01/2022, 1:53 PM

## 2022-06-01 NOTE — Progress Notes (Signed)
  Progress Note   Patient: Roger [name]:[id number] DOB: [birth year] DOA: 05/31/2022     0 DOS: the patient was seen and examined on 06/01/2022   Brief hospital course: 66 y.o. male with medical history significant of SCC of left lung, HTN, COPD, anxiety, DVT, HLD. Presenting with hypoxia. He was in his normal state of health this morning [facility]. In his pre-evaluation, it was noted that he was hypoxic into the low 80s. He is not normally on supplemental O2. They cancelled his infusion and recommended that he go to the ED for evaluation. He denies any chest pain, palpitations, dyspnea, lightheadedness or dizziness. Of note, he was diagnosed w/ a LLE DVT last week. He was supposed to start an eliquis regimen, but did not take his first dose until this morning. He denies any aggravating or alleviating factors.    Assessment and Plan: Acute PE LLE DVT     - CT findings of moderate PE with evidence of R heart strain     - cont heparin gtt     - 2d echo reviewed. R atrial size moderately dilated and RV systolic function moderately reduced     -cont heparin, wean O2 as tolerated   SCC of left lung     - continue outpt follow up with Dr. Earlie Server   HTN     - BP is soft right now; cont to hold home regimen for now   COPD     - currently on RA -Cont on PRN duoneb as needed   Anxiety     - cont home xanax per home regimen -Seems stable this AM   HLD     - cont home statin   Hyperglycemia     - check A1c, pending   Tobacco abuse     - counsel against further use      Subjective: Without complaints this AM  Physical Exam: Vitals:   06/01/22 1030 06/01/22 1200 06/01/22 1203 06/01/22 1616  BP: 90/78 112/73  104/70  Pulse: 74 63  77  Resp: 18 (!) 24    Temp:   (!) 97.5 F (36.4 C) 97.9 F (36.6 C)  TempSrc:   Oral Oral  SpO2: 95% 97%  95%  Weight:      Height:       General exam: Awake, laying in bed, in nad Respiratory system: Normal  respiratory effort, no audible wheezing Cardiovascular system: regular rate, s1, s2 Gastrointestinal system: Soft, nondistended, positive BS Central nervous system: CN2-12 grossly intact, strength intact Extremities: Perfused, no clubbing Skin: Normal skin turgor, no notable skin lesions seen Psychiatry: Mood normal // no visual hallucinations   Data Reviewed:  Labs reviewed: Na 139, K 3.5, Cr 0.86, Hgb 10.7  Family Communication: Pt in room, family not at bedside  Disposition: Status is: Inpatient Remains inpatient appropriate because: Severity of illness  Planned Discharge Destination: Home   Author: Marylu Lund, MD 06/01/2022 5:02 PM  For on call review [email or web address].

## 2022-06-01 NOTE — Progress Notes (Signed)
ANTICOAGULATION CONSULT NOTE  Pharmacy Consult for IV heparin Indication: pulmonary embolus and DVT  Allergies  Allergen Reactions   Demerol Palpitations and Other (See Comments)    Heart beats fast   Erythromycin Nausea And Vomiting   Prednisone Other (See Comments)    "felt weird"   Sulfa Antibiotics Nausea And Vomiting and Other (See Comments)    hematoemesis   Augmentin [Amoxicillin-Pot Clavulanate] Nausea And Vomiting    Patient Measurements: Height: 5\' 11"  (180.3 cm) Weight: 75 kg (165 lb 5.5 oz) IBW/kg (Calculated) : 75.3 Heparin Dosing Weight: actual body weight   Vital Signs: Temp: 97.5 F (36.4 C) (11/29 1203) Temp Source: Oral (11/29 1203) BP: 112/73 (11/29 1200) Pulse Rate: 63 (11/29 1200)  Labs: Recent Labs    05/31/22 0811 05/31/22 1105 05/31/22 1155 05/31/22 1308 05/31/22 1427 06/01/22 0400 06/01/22 0500 06/01/22 1159  HGB 11.5* 11.1*  --   --   --   --  10.7*  --   HCT 36.7* 36.1*  --   --   --   --  35.1*  --   PLT 157 146*  --   --   --   --  135*  --   APTT  --   --   --  37*  --  68*  --  74*  HEPARINUNFRC  --   --   --  >1.10*  --  >1.10*  --   --   CREATININE 0.87 0.80  --   --   --   --  0.86  --   TROPONINIHS  --   --  16  --  16  --   --   --      Estimated Creatinine Clearance: 90.8 mL/min (by C-G formula based on SCr of 0.86 mg/dL).   Medical History: Past Medical History:  Diagnosis Date   Anxiety    Asthma    COPD (chronic obstructive pulmonary disease) ([place])    Depression    Hemorrhoids    History of rectal bleeding    Hypertension    Palpitations    Pneumonia    in 2015   Post-operative nausea and vomiting    pt unsure   Primary squamous cell carcinoma of left lung ([place]) 05/03/2022    Medications: Patient recently prescribed apixaban for DVT.  -First dose taken 11/28 ~0800  Assessment: Patient is a 64 yoM with PMH significant for lung cancer, COPD, anxiety, HTN. Patient was diagnosed with LLE DVT last week and  prescribed apixaban. However, patient did not start taking apixaban until this morning (11/28). Patient was sent to the ED from the cancer center with hypoxia. CTA revealed acute PE consistent with at least submassive PE with evidence of RHS.   -Patient took 10mg  of apixaban ~0800 on 11/28 -Baseline HL is falsely elevated (consistent with recent dose of DOAC). Will monitor using aPTT for now.   Today, 06/01/22 aPTT = 74 seconds, remains therapeutic on heparin infusion at 1350 units/hr Heparin level > 1.1 units/mL, remains falsely elevated as expected due to recent DOAC CBC: Hgb decreased to 10.7, Plt decreased to 135K No bleeding or infusion issues noted per nursing   Goal of Therapy:  Heparin level 0.3-0.7 units/ml aPTT 66-102 seconds Monitor platelets by anticoagulation protocol: Yes   Plan:  Continue heparin infusion at 1350 units/hr Daily CBC, aPTT, heparin level. Once heparin level and aPTT correlate, can monitor using heparin level only. Monitor closely for signs/symptoms of bleeding   [place]  Scherrie November, PharmD, BCPS Clinical Pharmacist  06/01/2022, 12:44 PM

## 2022-06-01 NOTE — Hospital Course (Signed)
66 y.o. male with medical history significant of SCC of left lung, HTN, COPD, anxiety, DVT, HLD. Presenting with hypoxia. He was in his normal state of health this morning when [facility]. In his pre-evaluation, it was noted that he was hypoxic into the low 80s. He is not normally on supplemental O2. They cancelled his infusion and recommended that he go to the ED for evaluation. He denies any chest pain, palpitations, dyspnea, lightheadedness or dizziness. Of note, he was diagnosed w/ a LLE DVT last week. He was supposed to start an eliquis regimen, but did not take his first dose until this morning. He denies any aggravating or alleviating factors.

## 2022-06-01 NOTE — Progress Notes (Signed)
ANTICOAGULATION CONSULT NOTE - follow up  Pharmacy Consult for heparin Indication: pulmonary embolus and DVT  Allergies  Allergen Reactions   Demerol Palpitations and Other (See Comments)    Heart beats fast   Erythromycin Nausea And Vomiting   Prednisone Other (See Comments)    "felt weird"   Sulfa Antibiotics Nausea And Vomiting and Other (See Comments)    hematoemesis   Augmentin [Amoxicillin-Pot Clavulanate] Nausea And Vomiting    Patient Measurements: Height: 5\' 11"  (180.3 cm) Weight: 75 kg (165 lb 5.5 oz) IBW/kg (Calculated) : 75.3 Heparin Dosing Weight: n/a. Use TBW of 75 kg  Vital Signs: Temp: 97.8 F (36.6 C) (11/29 0400) Temp Source: Oral (11/29 0400) BP: 104/71 (11/29 0400) Pulse Rate: 63 (11/29 0400)  Labs: Recent Labs    05/31/22 0811 05/31/22 1105 05/31/22 1155 05/31/22 1308 05/31/22 1427 06/01/22 0400  HGB 11.5* 11.1*  --   --   --   --   HCT 36.7* 36.1*  --   --   --   --   PLT 157 146*  --   --   --   --   APTT  --   --   --  37*  --  68*  HEPARINUNFRC  --   --   --  >1.10*  --  >1.10*  CREATININE 0.87 0.80  --   --   --   --   TROPONINIHS  --   --  16  --  16  --      Estimated Creatinine Clearance: 97.7 mL/min (by C-G formula based on SCr of 0.8 mg/dL).   Medical History: Past Medical History:  Diagnosis Date   Anxiety    Asthma    COPD (chronic obstructive pulmonary disease) ([place])    Depression    Hemorrhoids    History of rectal bleeding    Hypertension    Palpitations    Pneumonia    in 2015   Post-operative nausea and vomiting    pt unsure   Primary squamous cell carcinoma of left lung ([place]) 05/03/2022    Medications: Pt recently prescribed apixaban for DVT.  -Last dose: 11/28 ~0900  Assessment: Pt is a 59 yoM with PMH significant for lung cancer, COPD, anxiety, HTN. Pt was diagnosed with LLE DVT last week and prescribed apixaban. However, pt did not start taking apixaban until this morning (11/28). Pt was sent to the ED  from the cancer center with hypoxia. CTA revealed acute PE consistent with at least submassive PE with evidence of RHS.   -Pt took 10mg  of apixaban ~0900 on 11/28 -Baseline HL is falsely elevated (consistent with recent dose of DOAC). Will monitor using aPTT for now.   Today, 06/01/22 aPTT 68 therapeutic on 1350 units/hr HL elevated as expected with apixaban still on board Per RN no bleeding noted   Goal of Therapy:  Heparin level 0.3-0.7 units/ml aPTT 66-102 seconds Monitor platelets by anticoagulation protocol: Yes   Plan:  Continue heparin drip at 1350 units/hr Check 6 hour aPTT level aPTT/HL, CBC daily while on heparin infusion. Once HL and aPTT correlate, can monitor using HL only. Monitor for signs of bleeding  Dolly Rias RPh 06/01/2022, 5:08 AM

## 2022-06-01 NOTE — Progress Notes (Signed)
Pt with increased O2 requirements from 3L to 5L [place] to maintain saturations at 92% or above, MD Wyline Copas made aware. Will continue to monitor.

## 2022-06-02 ENCOUNTER — Ambulatory Visit: Payer: BC Managed Care – PPO | Admitting: Radiation Oncology

## 2022-06-02 ENCOUNTER — Inpatient Hospital Stay: Payer: BC Managed Care – PPO

## 2022-06-02 ENCOUNTER — Ambulatory Visit: Payer: BC Managed Care – PPO

## 2022-06-02 ENCOUNTER — Other Ambulatory Visit: Payer: Self-pay

## 2022-06-02 DIAGNOSIS — I2609 Other pulmonary embolism with acute cor pulmonale: Secondary | ICD-10-CM | POA: Diagnosis not present

## 2022-06-02 LAB — CBC
HCT: 33.9 % — ABNORMAL LOW (ref 39.0–52.0)
Hemoglobin: 10.3 g/dL — ABNORMAL LOW (ref 13.0–17.0)
MCH: 28.9 pg (ref 26.0–34.0)
MCHC: 30.4 g/dL (ref 30.0–36.0)
MCV: 95 fL (ref 80.0–100.0)
Platelets: 106 10*3/uL — ABNORMAL LOW (ref 150–400)
RBC: 3.57 MIL/uL — ABNORMAL LOW (ref 4.22–5.81)
RDW: 16.8 % — ABNORMAL HIGH (ref 11.5–15.5)
WBC: 6.6 10*3/uL (ref 4.0–10.5)
nRBC: 0 % (ref 0.0–0.2)

## 2022-06-02 LAB — COMPREHENSIVE METABOLIC PANEL
ALT: 18 U/L (ref 0–44)
AST: 17 U/L (ref 15–41)
Albumin: 2.5 g/dL — ABNORMAL LOW (ref 3.5–5.0)
Alkaline Phosphatase: 114 U/L (ref 38–126)
Anion gap: 9 (ref 5–15)
BUN: 8 mg/dL (ref 8–23)
CO2: 25 mmol/L (ref 22–32)
Calcium: 8.4 mg/dL — ABNORMAL LOW (ref 8.9–10.3)
Chloride: 105 mmol/L (ref 98–111)
Creatinine, Ser: 0.72 mg/dL (ref 0.61–1.24)
GFR, Estimated: >60 mL/min (ref >60)
Glucose, Bld: 110 mg/dL — ABNORMAL HIGH (ref 70–99)
Potassium: 3.2 mmol/L — ABNORMAL LOW (ref 3.5–5.1)
Sodium: 139 mmol/L (ref 135–145)
Total Bilirubin: 0.6 mg/dL (ref 0.3–1.2)
Total Protein: 6 g/dL — ABNORMAL LOW (ref 6.5–8.1)

## 2022-06-02 LAB — APTT: aPTT: 84 seconds — ABNORMAL HIGH (ref 24–36)

## 2022-06-02 LAB — HEPARIN LEVEL (UNFRACTIONATED): Heparin Unfractionated: 0.4 IU/mL (ref 0.30–0.70)

## 2022-06-02 LAB — MAGNESIUM: Magnesium: 1.8 mg/dL (ref 1.7–2.4)

## 2022-06-02 MED ORDER — POTASSIUM CHLORIDE CRYS ER 20 MEQ PO TBCR
40.0000 meq | EXTENDED_RELEASE_TABLET | ORAL | Status: AC
  Administered 2022-06-02 (×2): 40 meq via ORAL
  Filled 2022-06-02 (×2): qty 2

## 2022-06-02 MED ORDER — ATENOLOL 25 MG PO TABS
12.5000 mg | ORAL_TABLET | Freq: Every day | ORAL | Status: DC
  Administered 2022-06-02 – 2022-06-03 (×2): 12.5 mg via ORAL
  Filled 2022-06-02 (×2): qty 1

## 2022-06-02 MED FILL — Dexamethasone Sodium Phosphate Inj 100 MG/10ML: INTRAMUSCULAR | Qty: 1 | Status: AC

## 2022-06-02 MED FILL — Fosaprepitant Dimeglumine For IV Infusion 150 MG (Base Eq): INTRAVENOUS | Qty: 5 | Status: AC

## 2022-06-02 NOTE — Plan of Care (Signed)

## 2022-06-02 NOTE — Progress Notes (Signed)
  Progress Note   Patient: Roger [name] LDJ:[id number] DOB: [birth year] DOA: 05/31/2022     1 DOS: the patient was seen and examined on 06/02/2022   Brief hospital course: 66 y.o. male with medical history significant of SCC of left lung, HTN, COPD, anxiety, DVT, HLD. Presenting with hypoxia. He was in his normal state of health this morning when [facility]. In his pre-evaluation, it was noted that he was hypoxic into the low 80s. He is not normally on supplemental O2. They cancelled his infusion and recommended that he go to the ED for evaluation. He denies any chest pain, palpitations, dyspnea, lightheadedness or dizziness. Of note, he was diagnosed w/ a LLE DVT last week. He was supposed to start an eliquis regimen, but did not take his first dose until this morning. He denies any aggravating or alleviating factors.    Assessment and Plan: Acute PE LLE DVT     - CT findings of moderate PE with evidence of R heart strain -Pt remains on 5LNC this AM     - cont heparin gtt     - 2d echo reviewed. R atrial size moderately dilated and RV systolic function moderately reduced     -cont heparin, wean O2 as tolerated, would likely benefit from home o2 on d/c -cxr overnight reviewed, clear   SCC of left lung     - continue outpt follow up with Dr. Earlie Server   HTN     - BP is soft right now; cont to hold home regimen for now   COPD     - currently on RA -Cont on PRN duoneb as needed   Anxiety     - cont home xanax per home regimen -Seems stable today   HLD     - cont home statin   Hyperglycemia     - check A1c, pending   Tobacco abuse     - cessation done this visit      Subjective: denies feeling sob despite remaining on 5LNC this AM  Physical Exam: Vitals:   06/02/22 0427 06/02/22 0513 06/02/22 1043 06/02/22 1403  BP: 111/87  (!) 100/59 96/65  Pulse: (!) 107  81 72  Resp: 18 18 18 18   Temp: 97.9 F (36.6 C)  97.6 F (36.4 C) 97.6 F (36.4 C)   TempSrc: Oral  Oral Oral  SpO2: 99%  96% (!) 77%  Weight:      Height:       General exam: Conversant, in no acute distress Respiratory system: normal chest rise, clear, no audible wheezing Cardiovascular system: regular rhythm, s1-s2 Gastrointestinal system: Nondistended, nontender, pos BS Central nervous system: No seizures, no tremors Extremities: No cyanosis, no joint deformities Skin: No rashes, no pallor Psychiatry: Affect normal // no auditory hallucinations   Data Reviewed:  Labs reviewed: Na 139, K 3.2, Cr 0.72, Hgb 10.3  Family Communication: Pt in room, family not at bedside  Disposition: Status is: Inpatient Remains inpatient appropriate because: Severity of illness  Planned Discharge Destination: Home   Author: Marylu Lund, MD 06/02/2022 4:55 PM  For on call review [email or web address].

## 2022-06-02 NOTE — Plan of Care (Signed)

## 2022-06-02 NOTE — TOC Initial Note (Signed)
Transition of Care [facility]) - Initial/Assessment Note    Patient Details  Name: Roger [name] MRN: [id number] Date of Birth: 10-23-1955  [facility]) CM/SW Contact:    Leeroy Cha, RN Phone Number: 06/02/2022, 9:04 AM  Clinical Narrative:                   Transition of Care [facility]) Screening Note   Patient Details  Name: Roger [name] Date of Birth: 1956/01/23   Transition of Care [facility]) CM/SW Contact:    Leeroy Cha, RN Phone Number: 06/02/2022, 9:04 AM    Transition of Care Department (TOC) has reviewed patient and no TOC needs have been identified at this time. We will continue to monitor patient advancement through interdisciplinary progression rounds. If new patient transition needs arise, please place a TOC consult.     Barriers to Discharge: Continued Medical Work up   Patient Goals and CMS Choice Patient states their goals for this hospitalization and ongoing recovery are:: to get well CMS Medicare.gov Compare Post Acute Care list provided to:: Patient    Expected Discharge Plan and Services     Discharge Planning Services: CM Consult   Living arrangements for the past 2 months: Apartment                                      Prior Living Arrangements/Services Living arrangements for the past 2 months: Apartment Lives with:: Self Patient language and need for interpreter reviewed:: Yes Do you feel safe going back to the place where you live?: Yes               Activities of Daily Living Home Assistive Devices/Equipment: None ADL Screening (condition at time of admission) Patient's cognitive ability adequate to safely complete daily activities?: Yes Is the patient deaf or have difficulty hearing?: No Does the patient have difficulty seeing, even when wearing glasses/contacts?: No Does the patient have difficulty concentrating, remembering, or making decisions?: No Patient able to express need for assistance with  ADLs?: Yes Does the patient have difficulty dressing or bathing?: No Independently performs ADLs?: Yes (appropriate for developmental age) Does the patient have difficulty walking or climbing stairs?: No Weakness of Legs: None Weakness of Arms/Hands: None  Permission Sought/Granted                  Emotional Assessment Appearance:: Appears stated age Attitude/Demeanor/Rapport: Engaged Affect (typically observed): Calm Orientation: : Oriented to Self, Oriented to Place, Oriented to  Time, Oriented to Situation Alcohol / Substance Use: Tobacco Use (former smoker) Psych Involvement: No (comment)  Admission diagnosis:  Acute pulmonary embolism ([place]) [I26.99] Patient Active Problem List   Diagnosis Date Noted   Hypoxia 05/31/2022   Acute pulmonary embolism ([place]) 05/31/2022   DVT (deep venous thrombosis) ([place]) 05/31/2022   HLD (hyperlipidemia) 05/31/2022   Hyperglycemia 05/31/2022   Edema 05/23/2022   Primary squamous cell carcinoma of left lung ([place]) 05/03/2022   Palpitations 10/17/2018   PVC (premature ventricular contraction) 10/17/2018   Essential hypertension 10/17/2018   COPD GOLD 0 still smoking  07/13/2015   Hemoptysis 06/11/2015   CAP (community acquired pneumonia) 06/11/2015   Lung nodules 06/11/2015   Cigarette smoker 03/19/2014   Generalized anxiety disorder 11/09/2011   PCP:  Aletha Halim., PA-C Pharmacy:   [id number] [place] [place] - [address] [place]  [place] [address] Phone: [phone] Fax: [phone]  [id number], [place]. [place] [address] Phone: [phone] Fax: [phone]  [id number] [place], [place] [address] Phone: [phone] Fax: [place] [address] Phone:  [phone] Fax: [phone]     Social Determinants of Health (SDOH) Interventions    Readmission Risk Interventions   No data to display

## 2022-06-02 NOTE — Progress Notes (Signed)
ANTICOAGULATION CONSULT NOTE  Pharmacy Consult for IV heparin Indication: pulmonary embolus and DVT  Allergies  Allergen Reactions   Demerol Palpitations and Other (See Comments)    Heart beats fast   Erythromycin Nausea And Vomiting   Prednisone Other (See Comments)    "felt weird"   Sulfa Antibiotics Nausea And Vomiting and Other (See Comments)    hematoemesis   Augmentin [Amoxicillin-Pot Clavulanate] Nausea And Vomiting    Patient Measurements: Height: 5\' 11"  (180.3 cm) Weight: 75 kg (165 lb 5.5 oz) IBW/kg (Calculated) : 75.3 Heparin Dosing Weight: actual body weight   Vital Signs: Temp: 97.9 F (36.6 C) (11/30 0427) Temp Source: Oral (11/30 0427) BP: 111/87 (11/30 0427) Pulse Rate: 107 (11/30 0427)  Labs: Recent Labs    05/31/22 1105 05/31/22 1155 05/31/22 1308 05/31/22 1308 05/31/22 1427 06/01/22 0400 06/01/22 0500 06/01/22 1159 06/02/22 0413  HGB 11.1*  --   --   --   --   --  10.7*  --  10.3*  HCT 36.1*  --   --   --   --   --  35.1*  --  33.9*  PLT 146*  --   --   --   --   --  135*  --  106*  APTT  --   --  37*   < >  --  68*  --  74* 84*  HEPARINUNFRC  --   --  >1.10*  --   --  >1.10*  --   --  0.40  CREATININE 0.80  --   --   --   --   --  0.86  --  0.72  TROPONINIHS  --  16  --   --  16  --   --   --   --    < > = values in this interval not displayed.     Estimated Creatinine Clearance: 97.7 mL/min (by C-G formula based on SCr of 0.72 mg/dL).   Medical History: Past Medical History:  Diagnosis Date   Anxiety    Asthma    COPD (chronic obstructive pulmonary disease) ([place])    Depression    Hemorrhoids    History of rectal bleeding    Hypertension    Palpitations    Pneumonia    in 2015   Post-operative nausea and vomiting    pt unsure   Primary squamous cell carcinoma of left lung ([place]) 05/03/2022    Medications: Patient recently prescribed apixaban for DVT.  -First dose taken 11/28 ~0800  Assessment: Patient is a 20 yoM with  PMH significant for lung cancer, COPD, anxiety, HTN. Patient was diagnosed with LLE DVT last week and prescribed apixaban. However, patient did not start taking apixaban until this morning (11/28). Patient was sent to the ED from the cancer center with hypoxia. CTA revealed acute PE consistent with at least submassive PE with evidence of RHS.   -Patient took 10mg  of apixaban ~0800 on 11/28 -Baseline HL is falsely elevated (consistent with recent dose of DOAC). Will monitor using aPTT for now.   Today, 06/02/22 aPTT = 84 seconds, remains therapeutic on heparin infusion at 1350 units/hr Heparin level is now 0.40, now correlating with aPTT.  CBC: Hgb stable at 10.3, Plt decreased to 106 No bleeding or infusion issues noted per nursing   Goal of Therapy:  Heparin level 0.3-0.7 units/ml aPTT 66-102 seconds Monitor platelets by anticoagulation protocol: Yes   Plan:  Continue heparin infusion at 1350  units/hr Daily CBC,  heparin level. Will use heparin level as aPTT and HL are  correlating. Monitor closely for signs/symptoms of bleeding    Royetta Asal, PharmD, BCPS 06/02/2022 10:05 AM

## 2022-06-03 ENCOUNTER — Other Ambulatory Visit: Payer: BC Managed Care – PPO

## 2022-06-03 ENCOUNTER — Inpatient Hospital Stay: Payer: BC Managed Care – PPO

## 2022-06-03 ENCOUNTER — Ambulatory Visit: Payer: BC Managed Care – PPO

## 2022-06-03 DIAGNOSIS — I2609 Other pulmonary embolism with acute cor pulmonale: Secondary | ICD-10-CM | POA: Diagnosis not present

## 2022-06-03 LAB — COMPREHENSIVE METABOLIC PANEL
ALT: 16 U/L (ref 0–44)
AST: 16 U/L (ref 15–41)
Albumin: 2.6 g/dL — ABNORMAL LOW (ref 3.5–5.0)
Alkaline Phosphatase: 112 U/L (ref 38–126)
Anion gap: 5 (ref 5–15)
BUN: 7 mg/dL — ABNORMAL LOW (ref 8–23)
CO2: 26 mmol/L (ref 22–32)
Calcium: 8.6 mg/dL — ABNORMAL LOW (ref 8.9–10.3)
Chloride: 108 mmol/L (ref 98–111)
Creatinine, Ser: 0.6 mg/dL — ABNORMAL LOW (ref 0.61–1.24)
GFR, Estimated: >60 mL/min (ref >60)
Glucose, Bld: 122 mg/dL — ABNORMAL HIGH (ref 70–99)
Potassium: 3.9 mmol/L (ref 3.5–5.1)
Sodium: 139 mmol/L (ref 135–145)
Total Bilirubin: 0.6 mg/dL (ref 0.3–1.2)
Total Protein: 6.3 g/dL — ABNORMAL LOW (ref 6.5–8.1)

## 2022-06-03 LAB — CBC
HCT: 33.1 % — ABNORMAL LOW (ref 39.0–52.0)
Hemoglobin: 10.1 g/dL — ABNORMAL LOW (ref 13.0–17.0)
MCH: 29.2 pg (ref 26.0–34.0)
MCHC: 30.5 g/dL (ref 30.0–36.0)
MCV: 95.7 fL (ref 80.0–100.0)
Platelets: 116 10*3/uL — ABNORMAL LOW (ref 150–400)
RBC: 3.46 MIL/uL — ABNORMAL LOW (ref 4.22–5.81)
RDW: 17.2 % — ABNORMAL HIGH (ref 11.5–15.5)
WBC: 6 10*3/uL (ref 4.0–10.5)
nRBC: 0 % (ref 0.0–0.2)

## 2022-06-03 LAB — HEPARIN LEVEL (UNFRACTIONATED): Heparin Unfractionated: 0.28 IU/mL — ABNORMAL LOW (ref 0.30–0.70)

## 2022-06-03 MED ORDER — ATENOLOL 25 MG PO TABS: 12.5000 mg | ORAL_TABLET | Freq: Every day | ORAL | 0 refills | Status: DC

## 2022-06-03 MED ORDER — APIXABAN 5 MG PO TABS: 5.0000 mg | ORAL_TABLET | Freq: Two times a day (BID) | ORAL | Status: DC

## 2022-06-03 MED ORDER — APIXABAN 5 MG PO TABS
10.0000 mg | ORAL_TABLET | Freq: Two times a day (BID) | ORAL | Status: DC
  Administered 2022-06-03: 10 mg via ORAL
  Filled 2022-06-03: qty 2

## 2022-06-03 NOTE — Discharge Summary (Addendum)
Physician Discharge Summary   Patient: Roger [name] MRN: [id number] DOB: 1955/07/15  Admit date:     05/31/2022  Discharge date: 06/03/22  Discharge Physician: Marylu Lund   PCP: Aletha Halim., PA-C   Recommendations at discharge:    Follow up with PCP in 1-2 weeks Recommend outpatient referral to Cardiology for new Afib  Discharge Diagnoses: Principal Problem:   Acute pulmonary embolism ([place]) Active Problems:   Generalized anxiety disorder   Cigarette smoker   COPD GOLD 0 still smoking    Essential hypertension   Primary squamous cell carcinoma of left lung (HCC)   DVT (deep venous thrombosis) (HCC)   HLD (hyperlipidemia)   Hyperglycemia  Resolved Problems:   * No resolved hospital problems. *  Hospital Course: 66 y.o. male with medical history significant of SCC of left lung, HTN, COPD, anxiety, DVT, HLD. Presenting with hypoxia. He was in his normal state of health this morning when [facility]. In his pre-evaluation, it was noted that he was hypoxic into the low 80s. He is not normally on supplemental O2. They cancelled his infusion and recommended that he go to the ED for evaluation. He denies any chest pain, palpitations, dyspnea, lightheadedness or dizziness. Of note, he was diagnosed w/ a LLE DVT last week. He was supposed to start an eliquis regimen, but did not take his first dose until this morning. He denies any aggravating or alleviating factors.    Assessment and Plan: Acute PE LLE DVT - CT findings of moderate PE with evidence of R heart strain -Had been requiring up to [facility] this visit, later able to wean to [facility] - had been continued on heparin gtt initially, later transitioned to eliquis by day of discharge - 2d echo reviewed. R atrial size moderately dilated and RV systolic function moderately reduced -f/u cxr noted to be clear -Pt was able to ambulate on 2LNC, remained stable. Pt meets home o2 requirements  Afib -new  finding this visit -suspect secondary to new PE and R heart strain -Rate controlled -Already anticoagulated on eliquis -Recommend referral to Cardiology to follow   SCC of left lung - continue outpt follow up with Dr. Earlie Server   HTN  - BP overall soft. -Pt did tolerate resuming atenolol at albeit lower dose of 12.5mg  daily -Recommend titrating bp meds as tolerated as outpatient   COPD  -no wheezing on exam -Was continued on PRN duoneb as needed   Anxiety     - cont home xanax per home regimen -Seems stable    HLD     - cont home statin   Hyperglycemia - A1c of 7.7 -will need close follow up with PCP   Tobacco abuse     - cessation done this visit        Consultants:  Procedures performed:   Disposition: Home Diet recommendation:  Cardiac diet DISCHARGE MEDICATION: Allergies as of 06/03/2022       Reactions   Demerol Palpitations, Other (See Comments)   Heart beats fast   Erythromycin Nausea And Vomiting   Prednisone Other (See Comments)   "felt weird"   Sulfa Antibiotics Nausea And Vomiting, Other (See Comments)   hematoemesis   Augmentin [amoxicillin-pot Clavulanate] Nausea And Vomiting        Medication List     STOP taking these medications    aspirin EC 81 MG tablet   methylPREDNISolone 4 MG Tbpk tablet Commonly known as: MEDROL DOSEPAK  potassium chloride SA 20 MEQ tablet Commonly known as: KLOR-CON M       TAKE these medications    albuterol 108 (90 Base) MCG/ACT inhaler Commonly known as: VENTOLIN HFA Inhale 2 puffs into the lungs every 6 (six) hours as needed for wheezing or shortness of breath.   ALPRAZolam 1 MG tablet Commonly known as: XANAX Take 1 mg by mouth 5 (five) times daily. 8-12-4-8- float last pill   apixaban 5 MG Tabs tablet Commonly known as: Eliquis Take 2 tablets (10mg ) twice daily for 7 days, then 1 tablet (5mg ) twice daily What changed:  how much to take how to take this when to take this additional  instructions   atenolol 25 MG tablet Commonly known as: TENORMIN Take 0.5 tablets (12.5 mg total) by mouth daily. Start taking on: June 04, 2022 What changed:  medication strength how much to take   atorvastatin 10 MG tablet Commonly known as: LIPITOR Take 10 mg by mouth every evening.   fluticasone 50 MCG/ACT nasal spray Commonly known as: FLONASE Place 2 sprays into both nostrils daily as needed for allergies.   lidocaine-prilocaine cream Commonly known as: EMLA Apply to the Port-A-Cath site 30-60 minutes before treatment.   metFORMIN 500 MG 24 hr tablet Commonly known as: GLUCOPHAGE-XR Take 500 mg by mouth every evening.   mirtazapine 15 MG tablet Commonly known as: REMERON Take 15 mg by mouth at bedtime.   prochlorperazine 10 MG tablet Commonly known as: COMPAZINE Take 1 tablet (10 mg total) by mouth every 6 (six) hours as needed for nausea or vomiting.               Durable Medical Equipment  (From admission, onward)           Start     Ordered   06/03/22 1446  For home use only DME oxygen  Once       Question Answer Comment  Length of Need 6 Months   Mode or (Route) Nasal cannula   Liters per Minute 2   Frequency Continuous (stationary and portable oxygen unit needed)   Oxygen delivery system Gas      06/03/22 1445   06/03/22 1347  For home use only DME oxygen  Once       Question Answer Comment  Length of Need 6 Months   Mode or (Route) Nasal cannula   Liters per Minute 2   Frequency Continuous (stationary and portable oxygen unit needed)   Oxygen delivery system Gas      06/03/22 [place] Follow up.   Contact information: [facility] [address] [phone]         Aletha Halim., PA-C Follow up in 1 week(s).   Specialty: Family Medicine Why: Hospital follow up Contact information: [address] [place] [address] [phone]                 Discharge Exam: Danley Danker Weights   05/31/22 0955  Weight: 75 kg   General exam: Awake, laying in bed, in nad Respiratory system: Normal respiratory effort, no wheezing Cardiovascular system: regular rate, s1, s2 Gastrointestinal system: Soft, nondistended, positive BS Central nervous system: CN2-12 grossly intact, strength intact Extremities: Perfused, no clubbing Skin: Normal skin turgor, no notable skin lesions seen Psychiatry: Mood normal // no visual hallucinations   Condition at discharge:  fair  The results of significant diagnostics from this hospitalization (including imaging, microbiology, ancillary and laboratory) are listed below for reference.   Imaging Studies: DG CHEST PORT 1 VIEW  Result Date: 06/01/2022 CLINICAL DATA:  Hypoxia EXAM: PORTABLE CHEST 1 VIEW COMPARISON:  05/31/2022 FINDINGS: Cardiac shadow is stable. Left upper lobe mass lesion is again identified with adjacent pleural thickening stable from the previous day. Stable left medial lung base lesion is noted similar to that seen on prior CT. No new focal infiltrate or effusion is seen. IMPRESSION: Stable left lung masses.  No acute abnormality noted. Electronically Signed   By: Inez Catalina M.D.   On: 06/01/2022 19:48   ECHOCARDIOGRAM COMPLETE  Result Date: 06/01/2022    ECHOCARDIOGRAM REPORT   Patient Name:   LYNK MARTI Date of Exam: 06/01/2022 Medical Rec #:  [id number]   Height:       71.0 in Accession #:    [id number]  Weight:       165.3 lb Date of Birth:  04/02/1956  BSA:          1.945 m Patient Age:    55 years    BP:           112/73 mmHg Patient Gender: M           HR:           71 bpm. Exam Location:  Inpatient Procedure: 2D Echo Indications:    pulmonary embolus  History:        Patient has no prior history of Echocardiogram examinations.                 COPD; Risk Factors:Dyslipidemia and Hypertension.  Sonographer:    Harvie Junior Referring Phys: [id number] [place]  1. Left  ventricular ejection fraction, by estimation, is 50 to 55%. The left ventricle has low normal function. The left ventricle demonstrates global hypokinesis. Left ventricular diastolic parameters are consistent with Grade I diastolic dysfunction (impaired relaxation). There is the interventricular septum is flattened in systole, consistent with right ventricular pressure overload.  2. Right ventricular systolic function is moderately reduced. The right ventricular size is moderately enlarged. There is moderately elevated pulmonary artery systolic pressure. The estimated right ventricular systolic pressure is 08.1 mmHg.  3. Right atrial size was moderately dilated.  4. A small pericardial effusion is present. There is no evidence of cardiac tamponade.  5. No evidence of mitral valve regurgitation.  6. The aortic valve is tricuspid. Aortic valve regurgitation is not visualized. Aortic valve sclerosis is present, with no evidence of aortic valve stenosis.  7. The inferior vena cava is dilated in size with >50% respiratory variability, suggesting right atrial pressure of 8 mmHg. Comparison(s): No prior Echocardiogram. Conclusion(s)/Recommendation(s): Signs of pulmonary hypertension. FINDINGS  Left Ventricle: Left ventricular ejection fraction, by estimation, is 50 to 55%. The left ventricle has low normal function. The left ventricle demonstrates global hypokinesis. The left ventricular internal cavity size was normal in size. There is no left ventricular hypertrophy. The interventricular septum is flattened in systole, consistent with right ventricular pressure overload. Left ventricular diastolic parameters are consistent with Grade I diastolic dysfunction (impaired relaxation). Right Ventricle: The right ventricular size is moderately enlarged. Right ventricular systolic function is moderately reduced. There is moderately elevated pulmonary artery systolic pressure. The tricuspid regurgitant velocity is 3.10 m/s, and  with an assumed right atrial pressure of 8 mmHg, the estimated right ventricular systolic pressure is 44.8 mmHg. Left Atrium:  Left atrial size was normal in size. Right Atrium: Right atrial size was moderately dilated. Pericardium: A small pericardial effusion is present. There is no evidence of cardiac tamponade. Mitral Valve: No evidence of mitral valve regurgitation. Tricuspid Valve: Tricuspid valve regurgitation is trivial. Aortic Valve: The aortic valve is tricuspid. Aortic valve regurgitation is not visualized. Aortic valve sclerosis is present, with no evidence of aortic valve stenosis. Aortic valve mean gradient measures 3.0 mmHg. Aortic valve peak gradient measures 5.1  mmHg. Aortic valve area, by VTI measures 2.83 cm. Pulmonic Valve: Pulmonic valve regurgitation is not visualized. Aorta: The aortic root and ascending aorta are structurally normal, with no evidence of dilitation. Venous: The inferior vena cava is dilated in size with greater than 50% respiratory variability, suggesting right atrial pressure of 8 mmHg. IAS/Shunts: No atrial level shunt detected by color flow Doppler.  LEFT VENTRICLE PLAX 2D LVIDd:         5.10 cm     Diastology LVIDs:         3.50 cm     LV e' medial:    5.55 cm/s LV PW:         0.80 cm     LV E/e' medial:  10.5 LV IVS:        0.80 cm     LV e' lateral:   8.49 cm/s LVOT diam:     2.10 cm     LV E/e' lateral: 6.9 LV SV:         62 LV SV Index:   32 LVOT Area:     3.46 cm  LV Volumes (MOD) LV vol d, MOD A2C: 58.1 ml LV vol d, MOD A4C: 67.6 ml LV vol s, MOD A2C: 32.2 ml LV vol s, MOD A4C: 30.0 ml LV SV MOD A2C:     25.9 ml LV SV MOD A4C:     67.6 ml LV SV MOD BP:      31.0 ml RIGHT VENTRICLE RV Basal diam:  5.00 cm RV Mid diam:    4.30 cm RV FAC:         15.9 % RV S prime:     6.74 cm/s TAPSE (M-mode): 1.6 cm LEFT ATRIUM             Index        RIGHT ATRIUM           Index LA diam:        3.30 cm 1.70 cm/m   RA Area:     19.50 cm LA Vol (A2C):   41.5 ml 21.34 ml/m  RA  Volume:   65.10 ml  33.47 ml/m LA Vol (A4C):   38.9 ml 20.00 ml/m LA Biplane Vol: 43.2 ml 22.21 ml/m  AORTIC VALVE                    PULMONIC VALVE AV Area (Vmax):    2.81 cm     PV Vmax:       0.82 m/s AV Area (Vmean):   2.70 cm     PV Peak grad:  2.7 mmHg AV Area (VTI):     2.83 cm AV Vmax:           113.00 cm/s AV Vmean:          72.400 cm/s AV VTI:            0.220 m AV Peak Grad:      5.1 mmHg AV Mean Grad:  3.0 mmHg LVOT Vmax:         91.70 cm/s LVOT Vmean:        56.400 cm/s LVOT VTI:          0.180 m LVOT/AV VTI ratio: 0.82  AORTA Ao Root diam: 3.50 cm Ao Asc diam:  3.00 cm MITRAL VALVE               TRICUSPID VALVE MV Area (PHT): 4.60 cm    TR Peak grad:   38.4 mmHg MV Decel Time: 165 msec    TR Vmax:        310.00 cm/s MR Peak grad: 50.1 mmHg MR Vmax:      354.00 cm/s  SHUNTS MV E velocity: 58.50 cm/s  Systemic VTI:  0.18 m MV A velocity: 63.20 cm/s  Systemic Diam: 2.10 cm MV E/A ratio:  0.93 Mary Scientist, physiological signed by Phineas Inches Signature Date/Time: 06/01/2022/2:02:16 PM    Final    CT Angio Chest PE W/Cm &/Or Wo Cm  Result Date: 05/31/2022 CLINICAL DATA:  Pulmonary embolism (PE) suspected, high prob. Hypoxia, known DVT. Stage IV primary bronchogenic carcinoma. EXAM: CT ANGIOGRAPHY CHEST WITH CONTRAST TECHNIQUE: Multidetector CT imaging of the chest was performed using the standard protocol during bolus administration of intravenous contrast. Multiplanar CT image reconstructions and MIPs were obtained to evaluate the vascular anatomy. RADIATION DOSE REDUCTION: This exam was performed according to the departmental dose-optimization program which includes automated exposure control, adjustment of the mA and/or kV according to patient size and/or use of iterative reconstruction technique. CONTRAST:  67mL OMNIPAQUE IOHEXOL 350 MG/ML SOLN COMPARISON:  PET CT 03/24/2022, CT chest 04/15/2022 FINDINGS: Cardiovascular: There is adequate opacification of the pulmonary arterial tree.  There are multiple intraluminal filling defects in keeping with acute pulmonary emboli seen within the lobar and segmental pulmonary arteries of the right middle and lower lobes. The embolic burden is moderate. The central pulmonary arteries are stably enlarged in keeping with changes of pulmonary arterial hypertension. However, there is reversal the normal RV/LV ratio, now measuring 1.4, and right to left shift of the intraventricular septum in keeping with changes of right heart strain. Global cardiac size is within normal limits. Extensive coronary artery calcification. Small pericardial effusion has developed. Mild atherosclerotic calcification noted within the thoracic aorta. No aortic aneurysm. Mediastinum/Nodes: Progressive pathologic subcarinal adenopathy is seen with the index lymph node measuring 2.1 cm in short axis diameter. Additional subcentimeter left hilar and subcarinal lymph nodes are identified, previously demonstrated is hypermetabolic on PET CT examination of 03/24/2022. The visualized thyroid is unremarkable. The esophagus is unremarkable. Lungs/Pleura: Severe emphysema. Previously noted hypermetabolic masses within the left upper lobe, left lower lobe, and right lower lobe are again identified with central necrosis and cavitation within the dominant left lower lobe pulmonary mass again noted. There has developed superimposed mildly progressive bibasilar ground-glass pulmonary infiltrate which may relate to mild superimposed alveolar pulmonary edema, hemorrhage, or inflammatory infiltrate as can be seen with drug therapy. No pneumothorax. Interval development of small left pleural effusion. Upper Abdomen: No acute abnormality. Left upper quadrant anterior abdominal wall soft tissue mass in keeping with a abdominal wall metastasis again noted measuring 3.0 x 3.4 cm in axial # 184/4. 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 Review of the MIP images confirms the above findings.  IMPRESSION: 1. Acute pulmonary emboli within the lobar and segmental pulmonary arteries of the right middle and lower lobes. The embolic burden is moderate. Positive for acute PE with CT evidence of right heart strain (RV/LV Ratio = 1.4 ) consistent with at least submassive (intermediate risk) PE. The presence of right heart strain has been associated with an increased risk of morbidity and mortality. Please refer to the "Code PE Focused" order set in EPIC. 2. Severe emphysema. 3. Extensive coronary artery calcification. 4. Interval development of a small pericardial effusion and small left pleural effusion. 5. Interval development of mildly progressive bibasilar ground-glass pulmonary infiltrate which are favored to relate to mild superimposed alveolar pulmonary edema, given the associated findings. Additional differential considerations are as listed above, however. 6. Multiple pulmonary masses, progressive pathologic adenopathy, osseous, and body wall metastases again identified in keeping with the patient's known stage IV bronchogenic neoplasm Aortic Atherosclerosis (ICD10-I70.0) and Emphysema (ICD10-J43.9). These results were called by telephone at the time of interpretation on 05/31/2022 at 11:52 am to provider Carmin Muskrat , who verbally acknowledged these results. Electronically Signed   By: Fidela Salisbury M.D.   On: 05/31/2022 11:54   DG Chest Port 1 View  Result Date: 05/31/2022 CLINICAL DATA:  Hypoxia.  Lung cancer EXAM: PORTABLE CHEST 1 VIEW COMPARISON:  04/18/2022, 04/15/2022 FINDINGS: Stable heart size. Aortic atherosclerosis. Left upper lobe lung mass appears slightly increased in size compared to the prior radiograph. Additional retrocardiac mass in the left lower lobe is grossly unchanged.  Known right lower lobe nodule is also similar in appearance to prior. Background of emphysema and interstitial lung disease. No pleural effusion or pneumothorax. IMPRESSION: Left upper lobe lung mass appears slightly increased in size compared to the prior radiograph. Additional left lower lobe lung mass and right lower lobe nodules are not appreciably changed. Electronically Signed   By: Davina Poke D.O.   On: 05/31/2022 10:35   VAS [place] LOWER EXTREMITY VENOUS (DVT)  Result Date: 05/24/2022  Lower Venous DVT Study Patient Name:  DARONTE SHOSTAK  Date of Exam:   05/24/2022 Medical Rec #: [id number]    Accession #:    [id number] Date of Birth: [birth year]   Patient Gender: M Patient Age:   75 years Exam Location[facility]  [facility] Procedure:      VAS [place] LOWER EXTREMITY VENOUS (DVT) Referring Phys: Gery Pray --------------------------------------------------------------------------------  Indications: Swelling.  Risk Factors: Cancer. Comparison Study: No prior studies. Performing Technologist: Oliver Hum RVT  Examination Guidelines: A complete evaluation includes B-mode imaging, spectral Doppler, color Doppler, and power Doppler as needed of all accessible portions of each vessel. Bilateral testing is considered an integral part of a complete examination. Limited examinations for reoccurring indications may be performed as noted. The reflux portion of the exam is performed with the patient in reverse Trendelenburg.  +-----+---------------+---------+-----------+----------+--------------+ RIGHTCompressibilityPhasicitySpontaneityPropertiesThrombus Aging +-----+---------------+---------+-----------+----------+--------------+ CFV  Full           Yes      Yes                                 +-----+---------------+---------+-----------+----------+--------------+   +---------+---------------+---------+-----------+----------+--------------+ LEFT      CompressibilityPhasicitySpontaneityPropertiesThrombus Aging +---------+---------------+---------+-----------+----------+--------------+ CFV      Full           Yes      Yes                                 +---------+---------------+---------+-----------+----------+--------------+  SFJ      Full                                                        +---------+---------------+---------+-----------+----------+--------------+ FV Prox  Full                                                        +---------+---------------+---------+-----------+----------+--------------+ FV Mid   Full                                                        +---------+---------------+---------+-----------+----------+--------------+ FV DistalFull                                                        +---------+---------------+---------+-----------+----------+--------------+ PFV      Full                                                        +---------+---------------+---------+-----------+----------+--------------+ POP      Full           Yes      Yes                                 +---------+---------------+---------+-----------+----------+--------------+ PTV      Partial                                      Acute          +---------+---------------+---------+-----------+----------+--------------+ PERO     None                                         Acute          +---------+---------------+---------+-----------+----------+--------------+ Soleal   Partial                                      Acute          +---------+---------------+---------+-----------+----------+--------------+ Gastroc  Partial                                      Acute          +---------+---------------+---------+-----------+----------+--------------+ GSV      None  Acute           +---------+---------------+---------+-----------+----------+--------------+    Summary: RIGHT: - No evidence of common femoral vein obstruction.  LEFT: - Findings consistent with acute deep vein thrombosis involving the left posterior tibial veins, left peroneal veins, left gastrocnemius veins, and left soleal veins. - No cystic structure found in the popliteal fossa.  *See table(s) above for measurements and observations. Electronically signed by Deitra Mayo MD on 05/24/2022 at 4:45:09 PM.    Final    MR Brain W Wo Contrast  Result Date: 05/21/2022 CLINICAL DATA:  Brain metastases suspected Lung mass, preop, r/o brain metastases EXAM: MRI HEAD WITHOUT AND WITH CONTRAST TECHNIQUE: Multiplanar, multiecho pulse sequences of the brain and surrounding structures were obtained without and with intravenous contrast. CONTRAST:  7 mL of Vueway IV. COMPARISON:  None Available. FINDINGS: Brain: No acute infarction, hemorrhage, hydrocephalus, extra-axial collection or mass lesion. No pathologic enhancement. Vascular: Major arterial flow voids are maintained at the skull base. Small incidental developmental venous anomaly in the right posterior parietotemporal region. Skull and upper cervical spine: Normal marrow signal. Sinuses/Orbits: Clear sinuses.  No acute orbital findings. Other: No mastoid effusions. IMPRESSION: No evidence of acute intracranial abnormality or metastatic disease. Electronically Signed   By: Margaretha Sheffield M.D.   On: 05/21/2022 10:56    Microbiology: Results for orders placed or performed during the hospital encounter of 05/31/22  Resp Panel by RT-PCR (Flu A&B, Covid) Anterior Nasal Swab     Status: None   Collection Time: 05/31/22 11:05 AM   Specimen: Anterior Nasal Swab  Result Value Ref Range Status   SARS Coronavirus 2 by RT PCR NEGATIVE NEGATIVE Final    Comment: (NOTE) SARS-CoV-2 target nucleic acids are NOT DETECTED.  The SARS-CoV-2 RNA is generally detectable in upper  respiratory specimens during the acute phase of infection. The lowest concentration of SARS-CoV-2 viral copies this assay can detect is 138 copies/mL. A negative result does not preclude SARS-Cov-2 infection and should not be used as the sole basis for treatment or other patient management decisions. A negative result may occur with  improper specimen collection/handling, submission of specimen other than nasopharyngeal swab, presence of viral mutation(s) within the areas targeted by this assay, and inadequate number of viral copies(<138 copies/mL). A negative result must be combined with clinical observations, patient history, and epidemiological information. The expected result is Negative.  Fact Sheet for Patients:  EntrepreneurPulse.com.au  Fact Sheet for Healthcare Providers:  IncredibleEmployment.be  This test is no t yet approved or cleared by the [place] FDA and  has been authorized for detection and/or diagnosis of SARS-CoV-2 by FDA under an Emergency Use Authorization (EUA). This EUA will remain  in effect (meaning this test can be used) for the duration of the COVID-19 declaration under Section 564(b)(1) of the Act, 21 U.S.C.section 360bbb-3(b)(1), unless the authorization is terminated  or revoked sooner.       Influenza A by PCR NEGATIVE NEGATIVE Final   Influenza B by PCR NEGATIVE NEGATIVE Final    Comment: (NOTE) The Xpert Xpress SARS-CoV-2/FLU/RSV plus assay is intended as an aid in the diagnosis of influenza from Nasopharyngeal swab specimens and should not be used as a sole basis for treatment. Nasal washings and aspirates are unacceptable for Xpert Xpress SARS-CoV-2/FLU/RSV testing.  Fact Sheet for Patients: EntrepreneurPulse.com.au  Fact Sheet for Healthcare Providers: IncredibleEmployment.be  This test is not yet approved or cleared by the [place] FDA and has been  authorized for detection and/or  diagnosis of SARS-CoV-2 by FDA under an Emergency Use Authorization (EUA). This EUA will remain in effect (meaning this test can be used) for the duration of the COVID-19 declaration under Section 564(b)(1) of the Act, 21 U.S.C. section 360bbb-3(b)(1), unless the authorization is terminated or revoked.  Performed at [facility], [place] [address], [place], [place] [address]     Labs: CBC: Recent Labs  Lab 05/31/22 0811 05/31/22 1105 06/01/22 0500 06/02/22 0413 06/03/22 0423  WBC 8.8 8.5 8.4 6.6 6.0  NEUTROABS 7.2  --   --   --   --   HGB 11.5* 11.1* 10.7* 10.3* 10.1*  HCT 36.7* 36.1* 35.1* 33.9* 33.1*  MCV 93.4 94.5 94.6 95.0 95.7  PLT 157 146* 135* 106* 675*   Basic Metabolic Panel: Recent Labs  Lab 05/31/22 0811 05/31/22 1105 06/01/22 0500 06/02/22 0413 06/03/22 0423  NA 138 139 139 139 139  K 3.3* 3.6 3.5 3.2* 3.9  CL 104 104 105 105 108  CO2 26 25 25 25 26   GLUCOSE 186* 144* 124* 110* 122*  BUN 13 12 11 8  7*  CREATININE 0.87 0.80 0.86 0.72 0.60*  CALCIUM 9.1 8.5* 8.6* 8.4* 8.6*  MG  --   --   --  1.8  --    Liver Function Tests: Recent Labs  Lab 05/31/22 0811 05/31/22 1105 06/01/22 0500 06/02/22 0413 06/03/22 0423  AST 17 22 22 17 16   ALT 23 27 23 18 16   ALKPHOS 145* 128* 127* 114 112  BILITOT 0.8 0.8 0.7 0.6 0.6  PROT 7.6 7.2 6.8 6.0* 6.3*  ALBUMIN 3.5 2.9* 2.8* 2.5* 2.6*   CBG: No results for input(s): "GLUCAP" in the last 168 hours.  Discharge time spent: less than 30 minutes.  Signed: Marylu Lund, MD [facility] 06/03/2022

## 2022-06-03 NOTE — Progress Notes (Signed)
ANTICOAGULATION CONSULT NOTE  Pharmacy Consult for IV heparin Indication: pulmonary embolus and DVT  Allergies  Allergen Reactions   Demerol Palpitations and Other (See Comments)    Heart beats fast   Erythromycin Nausea And Vomiting   Prednisone Other (See Comments)    "felt weird"   Sulfa Antibiotics Nausea And Vomiting and Other (See Comments)    hematoemesis   Augmentin [Amoxicillin-Pot Clavulanate] Nausea And Vomiting    Patient Measurements: Height: 5\' 11"  (180.3 cm) Weight: 75 kg (165 lb 5.5 oz) IBW/kg (Calculated) : 75.3 Heparin Dosing Weight: actual body weight   Vital Signs: Temp: 98.7 F (37.1 C) (11/30 2116) Temp Source: Oral (11/30 2116) BP: 114/76 (11/30 2116) Pulse Rate: 80 (11/30 2116)  Labs: Recent Labs    05/31/22 1105 05/31/22 1155 05/31/22 1308 05/31/22 1427 06/01/22 0400 06/01/22 0500 06/01/22 1159 06/02/22 0413 06/03/22 0423  HGB 11.1*  --   --   --   --  10.7*  --  10.3* 10.1*  HCT 36.1*  --   --   --   --  35.1*  --  33.9* 33.1*  PLT 146*  --   --   --   --  135*  --  106* 116*  APTT  --   --    < >  --  68*  --  74* 84*  --   HEPARINUNFRC  --   --    < >  --  >1.10*  --   --  0.40 0.28*  CREATININE 0.80  --   --   --   --  0.86  --  0.72  --   TROPONINIHS  --  16  --  16  --   --   --   --   --    < > = values in this interval not displayed.     Estimated Creatinine Clearance: 97.7 mL/min (by C-G formula based on SCr of 0.72 mg/dL).   Medical History: Past Medical History:  Diagnosis Date   Anxiety    Asthma    COPD (chronic obstructive pulmonary disease) ([place])    Depression    Hemorrhoids    History of rectal bleeding    Hypertension    Palpitations    Pneumonia    in 2015   Post-operative nausea and vomiting    pt unsure   Primary squamous cell carcinoma of left lung ([place]) 05/03/2022    Medications: Patient recently prescribed apixaban for DVT.  -First dose taken 11/28 ~0800  Assessment: Patient is a 44 yoM with  PMH significant for lung cancer, COPD, anxiety, HTN. Patient was diagnosed with LLE DVT last week and prescribed apixaban. However, patient did not start taking apixaban until this morning (11/28). Patient was sent to the ED from the cancer center with hypoxia. CTA revealed acute PE consistent with at least submassive PE with evidence of RHS.   -Patient took 10mg  of apixaban ~0800 on 11/28 -Baseline HL is falsely elevated (consistent with recent dose of DOAC). Will monitor using aPTT for now.   Today, 06/03/22 HL 0.28 subtherapeutic on 1350 units/hr CBC: Hgb stable at 10.1, Plt low 116 No bleeding or infusion issues noted per nursing   Goal of Therapy:  Heparin level 0.3-0.7 units/ml aPTT 66-102 seconds Monitor platelets by anticoagulation protocol: Yes   Plan:  Increase heparin infusion to 1500 units/hr Heparin level in 6 hours Daily CBC,  heparin level. Will use heparin level as aPTT and HL are  correlating. Monitor closely for signs/symptoms of bleeding   Dolly Rias RPh 06/03/2022, 5:02 AM

## 2022-06-03 NOTE — Progress Notes (Signed)
AVS given to patient and explained at the bedside. Medications and follow up appointments have been explained with pt verbalizing understanding. O2 delivered to bedside and home for discharge.

## 2022-06-03 NOTE — TOC Initial Note (Signed)
Transition of Care [facility]) - Initial/Assessment Note    Patient Details  Name: Roger [name] MRN: [id number] Date of Birth: [birth year]  Transition of Care [facility]) CM/SW Contact:    Henrietta Dine, RN Phone Number: 06/03/2022, 2:56 PM  Clinical Narrative:                 [facility] consult for home oxygen; pt agrees to receive service; he does not have an agency preference; the pt says he lives in apt w/ his sister; the pt says he wears glasses and upper/lower dentures; he does not have DME or [place] services; the pt says he has transport home; spoke with Caryl Pina at [facility] and he says they can service the pt; Caryl Pina also says they will deliver tank to room for transport home; pt notified and contact information will be placed in follow up section of d/c instructions; no TOC needs  Expected Discharge Plan: Home/Self Care Barriers to Discharge: No Barriers Identified   Patient Goals and CMS Choice Patient states their goals for this hospitalization and ongoing recovery are:: to get well CMS Medicare.gov Compare Post Acute Care list provided to:: Patient    Expected Discharge Plan and Services Expected Discharge Plan: Home/Self Care   Discharge Planning Services: CM Consult Post Acute Care Choice: Durable Medical Equipment (home oxygen arranged with [facility]) Living arrangements for the past 2 months: Apartment                 DME Arranged: Oxygen DME Agency: [facility] Date DME Agency Contacted: 06/03/22 Time DME Agency Contacted: [phone] Representative spoke with at DME Agency: Caryl Pina            Prior Living Arrangements/Services Living arrangements for the past 2 months: Apartment Lives with:: Siblings (lives with sister) Patient language and need for interpreter reviewed:: Yes Do you feel safe going back to the place where you live?: Yes      Need for Family Participation in Patient Care: Yes (Comment) Care giver support system in place?: Yes (comment)   Criminal Activity/Legal  Involvement Pertinent to Current Situation/Hospitalization: No - Comment as needed  Activities of Daily Living Home Assistive Devices/Equipment: None ADL Screening (condition at time of admission) Patient's cognitive ability adequate to safely complete daily activities?: Yes Is the patient deaf or have difficulty hearing?: No Does the patient have difficulty seeing, even when wearing glasses/contacts?: No Does the patient have difficulty concentrating, remembering, or making decisions?: No Patient able to express need for assistance with ADLs?: Yes Does the patient have difficulty dressing or bathing?: No Independently performs ADLs?: Yes (appropriate for developmental age) Does the patient have difficulty walking or climbing stairs?: No Weakness of Legs: None Weakness of Arms/Hands: None  Permission Sought/Granted Permission sought to share information with : Case Manager Permission granted to share information with : Yes, Verbal Permission Granted  Share Information with NAME: Lenor Coffin, RN, CM           Emotional Assessment Appearance:: Appears stated age Attitude/Demeanor/Rapport: Gracious Affect (typically observed): Accepting Orientation: : Oriented to Self, Oriented to Place, Oriented to  Time, Oriented to Situation Alcohol / Substance Use: Not Applicable Psych Involvement: No (comment)  Admission diagnosis:  Acute pulmonary embolism ([place]) [I26.99] Patient Active Problem List   Diagnosis Date Noted   Hypoxia 05/31/2022   Acute pulmonary embolism ([place]) 05/31/2022   DVT (deep venous thrombosis) ([place]) 05/31/2022   HLD (hyperlipidemia) 05/31/2022   Hyperglycemia 05/31/2022   Edema 05/23/2022   Primary squamous cell carcinoma  of left lung ([place]) 05/03/2022   Palpitations 10/17/2018   PVC (premature ventricular contraction) 10/17/2018   Essential hypertension 10/17/2018   COPD GOLD 0 still smoking  07/13/2015   Hemoptysis 06/11/2015   CAP (community acquired  pneumonia) 06/11/2015   Lung nodules 06/11/2015   Cigarette smoker 03/19/2014   Generalized anxiety disorder 11/09/2011   PCP:  Aletha Halim., PA-C Pharmacy:   [facility] [address], [place] [address] Phone: [phone] Fax: [phone]  [id number] [address], [place] [place] [address] Phone: [phone] Fax: [phone]  [id number] [place], [place] [address] Phone: [phone] Fax: [place] [address] Phone: [phone] Fax: [phone]     Social Determinants of Health (SDOH) Interventions    Readmission Risk Interventions     No data to display

## 2022-06-03 NOTE — Progress Notes (Signed)
ANTICOAGULATION CONSULT NOTE  Pharmacy Consult for IV heparin >> apixaban  Indication: pulmonary embolus and DVT  Allergies  Allergen Reactions   Demerol Palpitations and Other (See Comments)    Heart beats fast   Erythromycin Nausea And Vomiting   Prednisone Other (See Comments)    "felt weird"   Sulfa Antibiotics Nausea And Vomiting and Other (See Comments)    hematoemesis   Augmentin [Amoxicillin-Pot Clavulanate] Nausea And Vomiting    Patient Measurements: Height: 5\' 11"  (180.3 cm) Weight: 75 kg (165 lb 5.5 oz) IBW/kg (Calculated) : 75.3 Heparin Dosing Weight: actual body weight   Vital Signs: Temp: 98.1 F (36.7 C) (12/01 0507) Temp Source: Oral (12/01 0507) BP: 110/72 (12/01 0507) Pulse Rate: 67 (12/01 0507)  Labs: Recent Labs    05/31/22 1155 05/31/22 1308 05/31/22 1427 06/01/22 0400 06/01/22 0500 06/01/22 1159 06/02/22 0413 06/03/22 0423  HGB  --   --   --   --  10.7*  --  10.3* 10.1*  HCT  --   --   --   --  35.1*  --  33.9* 33.1*  PLT  --   --   --   --  135*  --  106* 116*  APTT  --    < >  --  68*  --  74* 84*  --   HEPARINUNFRC  --    < >  --  >1.10*  --   --  0.40 0.28*  CREATININE  --   --   --   --  0.86  --  0.72 0.60*  TROPONINIHS 16  --  16  --   --   --   --   --    < > = values in this interval not displayed.     Estimated Creatinine Clearance: 97.7 mL/min (A) (by C-G formula based on SCr of 0.6 mg/dL (L)).   Medical History: Past Medical History:  Diagnosis Date   Anxiety    Asthma    COPD (chronic obstructive pulmonary disease) ([place])    Depression    Hemorrhoids    History of rectal bleeding    Hypertension    Palpitations    Pneumonia    in 2015   Post-operative nausea and vomiting    pt unsure   Primary squamous cell carcinoma of left lung ([place]) 05/03/2022    Medications: Patient recently prescribed apixaban for DVT.  -First dose taken 11/28 ~0800  Assessment: Patient is a 17 yoM with PMH significant for lung  cancer, COPD, anxiety, HTN. Patient was diagnosed with LLE DVT last week and prescribed apixaban. However, patient did not start taking apixaban until this morning (11/28). Patient was sent to the ED from the cancer center with hypoxia. CTA revealed acute PE consistent with at least submassive PE with evidence of RHS.   On 12/1 Plan is to transition back to apixaban,   Today, 06/03/22 Hgb 19.1 ,plt 116  SCr 0.60 mg/dl, Crcl 97 ml/min       Goal of Therapy:  Heparin level 0.3-0.7 units/ml aPTT 66-102 seconds Monitor platelets by anticoagulation protocol: Yes   Plan:  Stop heparin drip and resume apixaban 10 mg PO BID x 7 days then 5 mg PO BID  Monitor renal function, CBC as available  Monitor closely for signs/symptoms of bleeding   Royetta Asal, PharmD, BCPS 06/03/2022 8:47 AM

## 2022-06-03 NOTE — Progress Notes (Signed)
SATURATION QUALIFICATIONS: (This note is used to comply with regulatory documentation for home oxygen)  Patient Saturations on Room Air at Rest = 96%  Patient Saturations on Room Air while Ambulating = 86%  Patient Saturations on 2 Liters of oxygen while Ambulating = 94%  Please briefly explain why patient needs home oxygen: desaturation with ambulation

## 2022-06-03 NOTE — Progress Notes (Signed)
Mobility Specialist - Progress Note   06/03/22 1204  Oxygen Therapy  O2 Device Nasal Cannula  O2 Flow Rate (L/min) 2 L/min  Mobility  Activity Ambulated independently in hallway  Level of Assistance Independent after set-up  Assistive Device None  Distance Ambulated (ft) 350 ft  Activity Response Tolerated well  Mobility Referral Yes  $Mobility charge 1 Mobility   Pt received in bed and agreeable to mobility. Pt desat to 81% during session but stated he felt fine. Nurse was in hallway & notified during this occurrence. No other complaints during mobility session. Pt room standing after session with all needs met.    During mobility: 81%  SpO2   Set designer

## 2022-06-04 ENCOUNTER — Inpatient Hospital Stay: Payer: BC Managed Care – PPO

## 2022-06-04 LAB — HEMOGLOBIN A1C
Hgb A1c MFr Bld: 8 % — ABNORMAL HIGH (ref 4.8–5.6)
Mean Plasma Glucose: 183 mg/dL

## 2022-06-06 ENCOUNTER — Ambulatory Visit: Payer: BC Managed Care – PPO | Admitting: Radiation Oncology

## 2022-06-06 ENCOUNTER — Telehealth: Payer: Self-pay | Admitting: Internal Medicine

## 2022-06-06 ENCOUNTER — Ambulatory Visit: Payer: BC Managed Care – PPO

## 2022-06-06 NOTE — Telephone Encounter (Signed)
Called patient regarding December and January appointments, left a voicemail.

## 2022-06-07 ENCOUNTER — Ambulatory Visit: Admission: RE | Admit: 2022-06-07 | Payer: BC Managed Care – PPO | Source: Ambulatory Visit

## 2022-06-07 ENCOUNTER — Inpatient Hospital Stay: Payer: BC Managed Care – PPO

## 2022-06-07 ENCOUNTER — Other Ambulatory Visit: Payer: BC Managed Care – PPO

## 2022-06-07 ENCOUNTER — Ambulatory Visit: Payer: BC Managed Care – PPO

## 2022-06-07 DIAGNOSIS — C3481 Malignant neoplasm of overlapping sites of right bronchus and lung: Secondary | ICD-10-CM | POA: Insufficient documentation

## 2022-06-07 DIAGNOSIS — Z79620 Long term (current) use of immunosuppressive biologic: Secondary | ICD-10-CM | POA: Diagnosis not present

## 2022-06-07 DIAGNOSIS — C3432 Malignant neoplasm of lower lobe, left bronchus or lung: Secondary | ICD-10-CM | POA: Insufficient documentation

## 2022-06-07 DIAGNOSIS — C3492 Malignant neoplasm of unspecified part of left bronchus or lung: Secondary | ICD-10-CM

## 2022-06-07 DIAGNOSIS — Z510 Encounter for antineoplastic radiation therapy: Secondary | ICD-10-CM | POA: Diagnosis not present

## 2022-06-07 LAB — CMP (CANCER CENTER ONLY)
ALT: 13 U/L (ref 0–44)
AST: 17 U/L (ref 15–41)
Albumin: 3.6 g/dL (ref 3.5–5.0)
Alkaline Phosphatase: 151 U/L — ABNORMAL HIGH (ref 38–126)
Anion gap: 9 (ref 5–15)
BUN: 8 mg/dL (ref 8–23)
CO2: 26 mmol/L (ref 22–32)
Calcium: 9.8 mg/dL (ref 8.9–10.3)
Chloride: 101 mmol/L (ref 98–111)
Creatinine: 0.76 mg/dL (ref 0.61–1.24)
GFR, Estimated: >60 mL/min (ref >60)
Glucose, Bld: 166 mg/dL — ABNORMAL HIGH (ref 70–99)
Potassium: 3.5 mmol/L (ref 3.5–5.1)
Sodium: 136 mmol/L (ref 135–145)
Total Bilirubin: 0.9 mg/dL (ref 0.3–1.2)
Total Protein: 7.8 g/dL (ref 6.5–8.1)

## 2022-06-07 LAB — CBC WITH DIFFERENTIAL (CANCER CENTER ONLY)
Abs Immature Granulocytes: 0.02 10*3/uL (ref 0.00–0.07)
Basophils Absolute: 0.1 10*3/uL (ref 0.0–0.1)
Basophils Relative: 1 %
Eosinophils Absolute: 0.1 10*3/uL (ref 0.0–0.5)
Eosinophils Relative: 1 %
HCT: 39.3 % (ref 39.0–52.0)
Hemoglobin: 12.3 g/dL — ABNORMAL LOW (ref 13.0–17.0)
Immature Granulocytes: 0 %
Lymphocytes Relative: 12 %
Lymphs Abs: 1 10*3/uL (ref 0.7–4.0)
MCH: 29.5 pg (ref 26.0–34.0)
MCHC: 31.3 g/dL (ref 30.0–36.0)
MCV: 94.2 fL (ref 80.0–100.0)
Monocytes Absolute: 0.5 10*3/uL (ref 0.1–1.0)
Monocytes Relative: 6 %
Neutro Abs: 7 10*3/uL (ref 1.7–7.7)
Neutrophils Relative %: 80 %
Platelet Count: 162 10*3/uL (ref 150–400)
RBC: 4.17 MIL/uL — ABNORMAL LOW (ref 4.22–5.81)
RDW: 18 % — ABNORMAL HIGH (ref 11.5–15.5)
WBC Count: 8.6 10*3/uL (ref 4.0–10.5)
nRBC: 0 % (ref 0.0–0.2)

## 2022-06-08 ENCOUNTER — Ambulatory Visit
Admission: RE | Admit: 2022-06-08 | Discharge: 2022-06-08 | Disposition: A | Discharge status: Home / Self Care | Payer: BC Managed Care – PPO | Source: Ambulatory Visit | Attending: Radiation Oncology | Admitting: Radiation Oncology

## 2022-06-08 ENCOUNTER — Ambulatory Visit: Payer: BC Managed Care – PPO

## 2022-06-08 ENCOUNTER — Other Ambulatory Visit: Payer: Self-pay

## 2022-06-08 DIAGNOSIS — C3492 Malignant neoplasm of unspecified part of left bronchus or lung: Secondary | ICD-10-CM

## 2022-06-08 DIAGNOSIS — Z510 Encounter for antineoplastic radiation therapy: Secondary | ICD-10-CM | POA: Insufficient documentation

## 2022-06-08 DIAGNOSIS — C3432 Malignant neoplasm of lower lobe, left bronchus or lung: Secondary | ICD-10-CM | POA: Diagnosis not present

## 2022-06-08 LAB — RAD ONC ARIA SESSION SUMMARY
Course Elapsed Days: 0
Plan Fractions Treated to Date: 1
Plan Prescribed Dose Per Fraction: 2.5 Gy
Plan Total Fractions Prescribed: 14
Plan Total Prescribed Dose: 35 Gy
Reference Point Dosage Given to Date: 2.5 Gy
Reference Point Session Dosage Given: 2.5 Gy
Session Number: 1

## 2022-06-08 MED ORDER — SONAFINE EX EMUL
1.0000 | Freq: Once | CUTANEOUS | Status: AC
  Administered 2022-06-08: 1 via TOPICAL

## 2022-06-09 ENCOUNTER — Ambulatory Visit
Admission: RE | Admit: 2022-06-09 | Discharge: 2022-06-09 | Disposition: A | Discharge status: Home / Self Care | Payer: BC Managed Care – PPO | Source: Ambulatory Visit | Attending: Radiation Oncology | Admitting: Radiation Oncology

## 2022-06-09 ENCOUNTER — Other Ambulatory Visit: Payer: BC Managed Care – PPO

## 2022-06-09 ENCOUNTER — Other Ambulatory Visit: Payer: Self-pay

## 2022-06-09 ENCOUNTER — Ambulatory Visit: Payer: BC Managed Care – PPO | Admitting: Physician Assistant

## 2022-06-09 ENCOUNTER — Other Ambulatory Visit: Payer: Self-pay | Admitting: Physician Assistant

## 2022-06-09 ENCOUNTER — Ambulatory Visit: Payer: BC Managed Care – PPO

## 2022-06-09 DIAGNOSIS — R630 Anorexia: Secondary | ICD-10-CM

## 2022-06-09 DIAGNOSIS — C3432 Malignant neoplasm of lower lobe, left bronchus or lung: Secondary | ICD-10-CM | POA: Diagnosis not present

## 2022-06-09 LAB — RAD ONC ARIA SESSION SUMMARY
Course Elapsed Days: 1
Plan Fractions Treated to Date: 2
Plan Prescribed Dose Per Fraction: 2.5 Gy
Plan Total Fractions Prescribed: 14
Plan Total Prescribed Dose: 35 Gy
Reference Point Dosage Given to Date: 5 Gy
Reference Point Session Dosage Given: 2.5 Gy
Session Number: 2

## 2022-06-09 MED ORDER — MIRTAZAPINE 15 MG PO TABS: 15.0000 mg | ORAL_TABLET | Freq: Every day | ORAL | 2 refills | Status: DC

## 2022-06-10 ENCOUNTER — Other Ambulatory Visit: Payer: Self-pay

## 2022-06-10 ENCOUNTER — Ambulatory Visit: Payer: BC Managed Care – PPO

## 2022-06-10 ENCOUNTER — Ambulatory Visit
Admission: RE | Admit: 2022-06-10 | Discharge: 2022-06-10 | Disposition: A | Discharge status: Home / Self Care | Payer: BC Managed Care – PPO | Source: Ambulatory Visit | Attending: Radiation Oncology | Admitting: Radiation Oncology

## 2022-06-10 DIAGNOSIS — C3432 Malignant neoplasm of lower lobe, left bronchus or lung: Secondary | ICD-10-CM | POA: Diagnosis not present

## 2022-06-10 LAB — RAD ONC ARIA SESSION SUMMARY
Course Elapsed Days: 2
Plan Fractions Treated to Date: 3
Plan Prescribed Dose Per Fraction: 2.5 Gy
Plan Total Fractions Prescribed: 14
Plan Total Prescribed Dose: 35 Gy
Reference Point Dosage Given to Date: 7.5 Gy
Reference Point Session Dosage Given: 2.5 Gy
Session Number: 3

## 2022-06-11 ENCOUNTER — Ambulatory Visit: Payer: BC Managed Care – PPO

## 2022-06-13 ENCOUNTER — Telehealth: Payer: Self-pay | Admitting: Medical Oncology

## 2022-06-13 ENCOUNTER — Other Ambulatory Visit: Payer: Self-pay

## 2022-06-13 ENCOUNTER — Ambulatory Visit: Payer: BC Managed Care – PPO

## 2022-06-13 DIAGNOSIS — C3492 Malignant neoplasm of unspecified part of left bronchus or lung: Secondary | ICD-10-CM

## 2022-06-13 NOTE — Telephone Encounter (Signed)
Hemoptysis-Roger [name] said she went in [facility] bathroom today and saw he had blood in the commode. Roger [name]  told her that he did not want to go to radiation today so he cancelled it. I instructed Inez Catalina to tell Tarence to keep his appts for tomorrow and let radiation know about the blood.I also told her to have [facility] call us .

## 2022-06-14 ENCOUNTER — Other Ambulatory Visit: Payer: BC Managed Care – PPO

## 2022-06-14 ENCOUNTER — Ambulatory Visit
Admission: RE | Admit: 2022-06-14 | Discharge: 2022-06-14 | Disposition: A | Discharge status: Home / Self Care | Payer: BC Managed Care – PPO | Source: Ambulatory Visit | Attending: Radiation Oncology | Admitting: Radiation Oncology

## 2022-06-14 ENCOUNTER — Other Ambulatory Visit: Payer: Self-pay

## 2022-06-14 ENCOUNTER — Inpatient Hospital Stay: Payer: BC Managed Care – PPO

## 2022-06-14 DIAGNOSIS — C3492 Malignant neoplasm of unspecified part of left bronchus or lung: Secondary | ICD-10-CM

## 2022-06-14 DIAGNOSIS — C3432 Malignant neoplasm of lower lobe, left bronchus or lung: Secondary | ICD-10-CM | POA: Diagnosis not present

## 2022-06-14 LAB — CBC WITH DIFFERENTIAL/PLATELET
Abs Immature Granulocytes: 0.02 10*3/uL (ref 0.00–0.07)
Basophils Absolute: 0.1 10*3/uL (ref 0.0–0.1)
Basophils Relative: 1 %
Eosinophils Absolute: 0.2 10*3/uL (ref 0.0–0.5)
Eosinophils Relative: 2 %
HCT: 36.9 % — ABNORMAL LOW (ref 39.0–52.0)
Hemoglobin: 11.8 g/dL — ABNORMAL LOW (ref 13.0–17.0)
Immature Granulocytes: 0 %
Lymphocytes Relative: 10 %
Lymphs Abs: 0.8 10*3/uL (ref 0.7–4.0)
MCH: 30.1 pg (ref 26.0–34.0)
MCHC: 32 g/dL (ref 30.0–36.0)
MCV: 94.1 fL (ref 80.0–100.0)
Monocytes Absolute: 0.7 10*3/uL (ref 0.1–1.0)
Monocytes Relative: 8 %
Neutro Abs: 6.5 10*3/uL (ref 1.7–7.7)
Neutrophils Relative %: 79 %
Platelets: 199 10*3/uL (ref 150–400)
RBC: 3.92 MIL/uL — ABNORMAL LOW (ref 4.22–5.81)
RDW: 17.2 % — ABNORMAL HIGH (ref 11.5–15.5)
WBC: 8.2 10*3/uL (ref 4.0–10.5)
nRBC: 0 % (ref 0.0–0.2)

## 2022-06-14 LAB — RAD ONC ARIA SESSION SUMMARY
Course Elapsed Days: 6
Plan Fractions Treated to Date: 4
Plan Prescribed Dose Per Fraction: 2.5 Gy
Plan Total Fractions Prescribed: 14
Plan Total Prescribed Dose: 35 Gy
Reference Point Dosage Given to Date: 10 Gy
Reference Point Session Dosage Given: 2.5 Gy
Session Number: 4

## 2022-06-14 LAB — COMPREHENSIVE METABOLIC PANEL
ALT: 12 U/L (ref 0–44)
AST: 20 U/L (ref 15–41)
Albumin: 3.6 g/dL (ref 3.5–5.0)
Alkaline Phosphatase: 128 U/L — ABNORMAL HIGH (ref 38–126)
Anion gap: 7 (ref 5–15)
BUN: 9 mg/dL (ref 8–23)
CO2: 28 mmol/L (ref 22–32)
Calcium: 10.2 mg/dL (ref 8.9–10.3)
Chloride: 99 mmol/L (ref 98–111)
Creatinine, Ser: 0.71 mg/dL (ref 0.61–1.24)
GFR, Estimated: >60 mL/min (ref >60)
Glucose, Bld: 200 mg/dL — ABNORMAL HIGH (ref 70–99)
Potassium: 4.5 mmol/L (ref 3.5–5.1)
Sodium: 134 mmol/L — ABNORMAL LOW (ref 135–145)
Total Bilirubin: 0.7 mg/dL (ref 0.3–1.2)
Total Protein: 7.6 g/dL (ref 6.5–8.1)

## 2022-06-15 ENCOUNTER — Other Ambulatory Visit: Payer: Self-pay

## 2022-06-15 ENCOUNTER — Ambulatory Visit
Admission: RE | Admit: 2022-06-15 | Discharge: 2022-06-15 | Disposition: A | Discharge status: Home / Self Care | Payer: BC Managed Care – PPO | Source: Ambulatory Visit | Attending: Radiation Oncology | Admitting: Radiation Oncology

## 2022-06-15 ENCOUNTER — Telehealth: Payer: Self-pay

## 2022-06-15 DIAGNOSIS — C3432 Malignant neoplasm of lower lobe, left bronchus or lung: Secondary | ICD-10-CM | POA: Diagnosis not present

## 2022-06-15 LAB — RAD ONC ARIA SESSION SUMMARY
Course Elapsed Days: 7
Plan Fractions Treated to Date: 5
Plan Prescribed Dose Per Fraction: 2.5 Gy
Plan Total Fractions Prescribed: 14
Plan Total Prescribed Dose: 35 Gy
Reference Point Dosage Given to Date: 12.5 Gy
Reference Point Session Dosage Given: 2.5 Gy
Session Number: 5

## 2022-06-15 NOTE — Telephone Encounter (Signed)
This nurse reached out to patient to see how he is feeling and to see if he was interested in restarting his treatment plan.  Left a message for patient to return call to the clinic.  No further concerns at this time.

## 2022-06-15 NOTE — Telephone Encounter (Signed)
This nurse returned call to [facility], patients sister in law who wanted to know if it was ok to drop off disability paperwork for patients job.  This nurse advised that she can drop it off and it will be given to the disability department and the do have a 10-14 day turn around for completion.  No further questions or concerns noted.

## 2022-06-16 ENCOUNTER — Other Ambulatory Visit: Payer: Self-pay

## 2022-06-16 ENCOUNTER — Ambulatory Visit
Admission: RE | Admit: 2022-06-16 | Discharge: 2022-06-16 | Disposition: A | Discharge status: Home / Self Care | Payer: BC Managed Care – PPO | Source: Ambulatory Visit | Attending: Radiation Oncology | Admitting: Radiation Oncology

## 2022-06-16 DIAGNOSIS — C3432 Malignant neoplasm of lower lobe, left bronchus or lung: Secondary | ICD-10-CM | POA: Diagnosis not present

## 2022-06-16 LAB — RAD ONC ARIA SESSION SUMMARY
Course Elapsed Days: 8
Plan Fractions Treated to Date: 6
Plan Prescribed Dose Per Fraction: 2.5 Gy
Plan Total Fractions Prescribed: 14
Plan Total Prescribed Dose: 35 Gy
Reference Point Dosage Given to Date: 15 Gy
Reference Point Session Dosage Given: 2.5 Gy
Session Number: 6

## 2022-06-17 ENCOUNTER — Other Ambulatory Visit: Payer: Self-pay

## 2022-06-17 ENCOUNTER — Ambulatory Visit
Admission: RE | Admit: 2022-06-17 | Discharge: 2022-06-17 | Disposition: A | Discharge status: Home / Self Care | Payer: BC Managed Care – PPO | Source: Ambulatory Visit | Attending: Radiation Oncology | Admitting: Radiation Oncology

## 2022-06-17 DIAGNOSIS — C3432 Malignant neoplasm of lower lobe, left bronchus or lung: Secondary | ICD-10-CM | POA: Diagnosis not present

## 2022-06-17 LAB — RAD ONC ARIA SESSION SUMMARY
Course Elapsed Days: 9
Plan Fractions Treated to Date: 7
Plan Prescribed Dose Per Fraction: 2.5 Gy
Plan Total Fractions Prescribed: 14
Plan Total Prescribed Dose: 35 Gy
Reference Point Dosage Given to Date: 17.5 Gy
Reference Point Session Dosage Given: 2.5 Gy
Session Number: 7

## 2022-06-20 ENCOUNTER — Telehealth: Payer: Self-pay

## 2022-06-20 ENCOUNTER — Ambulatory Visit
Admission: RE | Admit: 2022-06-20 | Discharge: 2022-06-20 | Disposition: A | Discharge status: Home / Self Care | Payer: BC Managed Care – PPO | Source: Ambulatory Visit | Attending: Radiation Oncology | Admitting: Radiation Oncology

## 2022-06-20 ENCOUNTER — Other Ambulatory Visit: Payer: Self-pay | Admitting: Physician Assistant

## 2022-06-20 ENCOUNTER — Other Ambulatory Visit: Payer: Self-pay

## 2022-06-20 DIAGNOSIS — I8290 Acute embolism and thrombosis of unspecified vein: Secondary | ICD-10-CM

## 2022-06-20 DIAGNOSIS — I2699 Other pulmonary embolism without acute cor pulmonale: Secondary | ICD-10-CM

## 2022-06-20 DIAGNOSIS — C3432 Malignant neoplasm of lower lobe, left bronchus or lung: Secondary | ICD-10-CM | POA: Diagnosis not present

## 2022-06-20 LAB — RAD ONC ARIA SESSION SUMMARY
Course Elapsed Days: 12
Plan Fractions Treated to Date: 8
Plan Prescribed Dose Per Fraction: 2.5 Gy
Plan Total Fractions Prescribed: 14
Plan Total Prescribed Dose: 35 Gy
Reference Point Dosage Given to Date: 20 Gy
Reference Point Session Dosage Given: 2.5 Gy
Session Number: 8

## 2022-06-20 MED ORDER — APIXABAN 5 MG PO TABS: 5.0000 mg | ORAL_TABLET | Freq: Two times a day (BID) | ORAL | 2 refills | Status: DC

## 2022-06-20 MED FILL — Dexamethasone Sodium Phosphate Inj 100 MG/10ML: INTRAMUSCULAR | Qty: 1 | Status: AC

## 2022-06-20 MED FILL — Fosaprepitant Dimeglumine For IV Infusion 150 MG (Base Eq): INTRAVENOUS | Qty: 5 | Status: AC

## 2022-06-20 NOTE — Telephone Encounter (Signed)
Already sent maintenance pack

## 2022-06-20 NOTE — Telephone Encounter (Signed)
Notified Patient of completion of Disability forms. Fax transmission confirmation received. Copy of Forms placed for pick-up as requested.

## 2022-06-21 ENCOUNTER — Inpatient Hospital Stay (HOSPITAL_BASED_OUTPATIENT_CLINIC_OR_DEPARTMENT_OTHER): Payer: BC Managed Care – PPO | Admitting: Internal Medicine

## 2022-06-21 ENCOUNTER — Inpatient Hospital Stay: Payer: BC Managed Care – PPO

## 2022-06-21 ENCOUNTER — Ambulatory Visit
Admission: RE | Admit: 2022-06-21 | Discharge: 2022-06-21 | Disposition: A | Discharge status: Home / Self Care | Payer: BC Managed Care – PPO | Source: Ambulatory Visit | Attending: Radiation Oncology | Admitting: Radiation Oncology

## 2022-06-21 ENCOUNTER — Other Ambulatory Visit: Payer: BC Managed Care – PPO

## 2022-06-21 ENCOUNTER — Inpatient Hospital Stay: Payer: BC Managed Care – PPO | Admitting: Dietician

## 2022-06-21 ENCOUNTER — Other Ambulatory Visit: Payer: Self-pay

## 2022-06-21 ENCOUNTER — Inpatient Hospital Stay: Payer: BC Managed Care – PPO | Admitting: Internal Medicine

## 2022-06-21 ENCOUNTER — Telehealth: Payer: Self-pay | Admitting: Medical Oncology

## 2022-06-21 ENCOUNTER — Other Ambulatory Visit: Payer: Self-pay | Admitting: Medical Oncology

## 2022-06-21 ENCOUNTER — Ambulatory Visit: Payer: BC Managed Care – PPO

## 2022-06-21 VITALS — BP 123/70 | HR 70 | Temp 97.6°F | Resp 16 | Wt 145.1 lb

## 2022-06-21 DIAGNOSIS — C3492 Malignant neoplasm of unspecified part of left bronchus or lung: Secondary | ICD-10-CM

## 2022-06-21 DIAGNOSIS — C3432 Malignant neoplasm of lower lobe, left bronchus or lung: Secondary | ICD-10-CM | POA: Diagnosis not present

## 2022-06-21 LAB — CBC WITH DIFFERENTIAL (CANCER CENTER ONLY)
Abs Immature Granulocytes: 0.01 10*3/uL (ref 0.00–0.07)
Basophils Absolute: 0.1 10*3/uL (ref 0.0–0.1)
Basophils Relative: 1 %
Eosinophils Absolute: 0.1 10*3/uL (ref 0.0–0.5)
Eosinophils Relative: 2 %
HCT: 38.8 % — ABNORMAL LOW (ref 39.0–52.0)
Hemoglobin: 12.3 g/dL — ABNORMAL LOW (ref 13.0–17.0)
Immature Granulocytes: 0 %
Lymphocytes Relative: 10 %
Lymphs Abs: 0.8 10*3/uL (ref 0.7–4.0)
MCH: 29 pg (ref 26.0–34.0)
MCHC: 31.7 g/dL (ref 30.0–36.0)
MCV: 91.5 fL (ref 80.0–100.0)
Monocytes Absolute: 0.6 10*3/uL (ref 0.1–1.0)
Monocytes Relative: 7 %
Neutro Abs: 6.1 10*3/uL (ref 1.7–7.7)
Neutrophils Relative %: 80 %
Platelet Count: 277 10*3/uL (ref 150–400)
RBC: 4.24 MIL/uL (ref 4.22–5.81)
RDW: 16 % — ABNORMAL HIGH (ref 11.5–15.5)
WBC Count: 7.6 10*3/uL (ref 4.0–10.5)
nRBC: 0 % (ref 0.0–0.2)

## 2022-06-21 LAB — CMP (CANCER CENTER ONLY)
ALT: 14 U/L (ref 0–44)
AST: 19 U/L (ref 15–41)
Albumin: 3.5 g/dL (ref 3.5–5.0)
Alkaline Phosphatase: 120 U/L (ref 38–126)
Anion gap: 10 (ref 5–15)
BUN: 11 mg/dL (ref 8–23)
CO2: 26 mmol/L (ref 22–32)
Calcium: 9.8 mg/dL (ref 8.9–10.3)
Chloride: 101 mmol/L (ref 98–111)
Creatinine: 0.8 mg/dL (ref 0.61–1.24)
GFR, Estimated: >60 mL/min (ref >60)
Glucose, Bld: 157 mg/dL — ABNORMAL HIGH (ref 70–99)
Potassium: 4.1 mmol/L (ref 3.5–5.1)
Sodium: 137 mmol/L (ref 135–145)
Total Bilirubin: 0.7 mg/dL (ref 0.3–1.2)
Total Protein: 8.5 g/dL — ABNORMAL HIGH (ref 6.5–8.1)

## 2022-06-21 LAB — RAD ONC ARIA SESSION SUMMARY
Course Elapsed Days: 13
Plan Fractions Treated to Date: 9
Plan Prescribed Dose Per Fraction: 2.5 Gy
Plan Total Fractions Prescribed: 14
Plan Total Prescribed Dose: 35 Gy
Reference Point Dosage Given to Date: 22.5 Gy
Reference Point Session Dosage Given: 2.5 Gy
Session Number: 9

## 2022-06-21 NOTE — Progress Notes (Signed)
Patient did not show for nutrition appointment. Rescheduled nutrition follow-up for Tuesday, January 9 during infusion.

## 2022-06-21 NOTE — Progress Notes (Signed)
[employer] [phone]   [phone]  OFFICE PROGRESS NOTE  Aletha Halim., PA-C [address] [place] [address]  DIAGNOSIS:  Stage IV (T3, N3, M1 C) non-small cell lung cancer, squamous cell carcinoma presented with bilateral pulmonary masses and nodules as well as mediastinal and bilateral hilar adenopathy in addition to subcutaneous metastatic disease and bone metastasis diagnosed in October 2023 with PD-L1 expression of 1%.    He has no actionable mutations.    PRIOR THERAPY: None   CURRENT THERAPY: 1) Palliative systemic chemotherapy with carboplatin for AUC of 5, paclitaxel 175 Mg/M2 and Keytruda 200 Mg IV every 3 weeks with Neulasta. Status post 1 cycle. First dose on 05/09/22 2) Palliative radiation to the painful abdominal lesion under the care of Dr. Sondra Come. Simulation scheduled for 05/23/22  INTERVAL HISTORY: Roger [name] 66 y.o. male returns to the clinic today for returns to the clinic today for follow-up visit.  The patient is feeling fine today with no concerning complaints.  He completed a course of palliative radiotherapy to the abdominal lesion under the care of Dr. Sondra Come. The patient also received 1 cycle of systemic chemotherapy with carboplatin, paclitaxel and Keytruda but he was lost to follow-up for several weeks and he missed few appointments.  He was diagnosed with acute pulmonary embolism and currently on treatment with Eliquis.  He has no current chest pain but has shortness of breath with exertion and mild cough with no hemoptysis.  He has no nausea, vomiting, diarrhea or constipation.  He has no headache or visual changes.  He is here today for evaluation before resuming his treatment.  MEDICAL HISTORY: Past Medical History:  Diagnosis Date   Anxiety    Asthma    COPD (chronic obstructive pulmonary disease) ([place])    Depression    Hemorrhoids    History of rectal bleeding    Hypertension    Palpitations     Pneumonia    in 2015   Post-operative nausea and vomiting    pt unsure   Primary squamous cell carcinoma of left lung ([place]) 05/03/2022    ALLERGIES:  is allergic to demerol, erythromycin, prednisone, sulfa antibiotics, and augmentin [amoxicillin-pot clavulanate].  MEDICATIONS:  Current Outpatient Medications  Medication Sig Dispense Refill   albuterol (PROVENTIL HFA;VENTOLIN HFA) 108 (90 Base) MCG/ACT inhaler Inhale 2 puffs into the lungs every 6 (six) hours as needed for wheezing or shortness of breath.     ALPRAZolam (XANAX) 1 MG tablet Take 1 mg by mouth 5 (five) times daily. 8-12-4-8- float last pill     apixaban (ELIQUIS) 5 MG TABS tablet Take 2 tablets (63m) twice daily for 7 days, then 1 tablet (553m twice daily (Patient taking differently: Take 10 mg by mouth 2 (two) times daily.) 60 tablet 0   apixaban (ELIQUIS) 5 MG TABS tablet Take 1 tablet (5 mg total) by mouth 2 (two) times daily. 60 tablet 2   atenolol (TENORMIN) 25 MG tablet Take 0.5 tablets (12.5 mg total) by mouth daily. 15 tablet 0   atorvastatin (LIPITOR) 10 MG tablet Take 10 mg by mouth every evening.     fluticasone (FLONASE) 50 MCG/ACT nasal spray Place 2 sprays into both nostrils daily as needed for allergies.     lidocaine-prilocaine (EMLA) cream Apply to the Port-A-Cath site 30-60 minutes before treatment. 30 g 0   metFORMIN (GLUCOPHAGE-XR) 500 MG 24 hr tablet Take 500 mg by mouth every evening.  mirtazapine (REMERON) 15 MG tablet Take 1 tablet (15 mg total) by mouth at bedtime. 30 tablet 2   prochlorperazine (COMPAZINE) 10 MG tablet Take 1 tablet (10 mg total) by mouth every 6 (six) hours as needed for nausea or vomiting. 30 tablet 0   No current facility-administered medications for this visit.    SURGICAL HISTORY:  Past Surgical History:  Procedure Laterality Date   DENTAL SURGERY     MASS EXCISION Right 04/20/2022   Procedure: NEEDLE BIOPSY OF CHEST WALL MASS RIGHT LOWER CHEST WALL;  Surgeon:  [name], MD;  Location: [facility];  Service: Thoracic;  Laterality: Right;   NOSE SURGERY     SKIN CANCER EXCISION     left forehead   TONSILLECTOMY     VIDEO BRONCHOSCOPY WITH ENDOBRONCHIAL NAVIGATION N/A 04/20/2022   Procedure: VIDEO BRONCHOSCOPY WITH ENDOBRONCHIAL NAVIGATION;  Surgeon: [name], MD;  Location: [facility];  Service: Thoracic;  Laterality: N/A;    REVIEW OF SYSTEMS:  Constitutional: positive for fatigue Eyes: negative Ears, nose, mouth, throat, and face: negative Respiratory: positive for dyspnea on exertion Cardiovascular: negative Gastrointestinal: negative Genitourinary:negative Integument/breast: negative Hematologic/lymphatic: negative Musculoskeletal:negative Neurological: negative Behavioral/Psych: negative Endocrine: negative Allergic/Immunologic: negative   PHYSICAL EXAMINATION: General appearance: alert, cooperative, fatigued, and no distress Head: Normocephalic, without obvious abnormality, atraumatic Neck: no adenopathy, no JVD, supple, symmetrical, trachea midline, and thyroid not enlarged, symmetric, no tenderness/mass/nodules Lymph nodes: Cervical, supraclavicular, and axillary nodes normal. Resp: clear to auscultation bilaterally Back: symmetric, no curvature. ROM normal. No CVA tenderness. Cardio: regular rate and rhythm, S1, S2 normal, no murmur, click, rub or gallop GI: soft, non-tender; bowel sounds normal; no masses,  no organomegaly Extremities: extremities normal, atraumatic, no cyanosis or edema Neurologic: Alert and oriented X 3, normal strength and tone. Normal symmetric reflexes. Normal coordination and gait  ECOG PERFORMANCE STATUS: 1 - Symptomatic but completely ambulatory  Blood pressure 123/70, pulse 70, temperature 97.6 F (36.4 C), temperature source Oral, resp. rate 16, weight 145 lb 1.6 oz (65.8 kg), SpO2 99 %.  LABORATORY DATA: Lab Results  Component Value Date   WBC 7.6 06/21/2022   HGB 12.3 (L)  06/21/2022   HCT 38.8 (L) 06/21/2022   MCV 91.5 06/21/2022   PLT 277 06/21/2022      Chemistry      Component Value Date/Time   NA 134 (L) 06/14/2022 1329   K 4.5 06/14/2022 1329   CL 99 06/14/2022 1329   CO2 28 06/14/2022 1329   BUN 9 06/14/2022 1329   CREATININE 0.71 06/14/2022 1329   CREATININE 0.76 06/07/2022 1334      Component Value Date/Time   CALCIUM 10.2 06/14/2022 1329   ALKPHOS 128 (H) 06/14/2022 1329   AST 20 06/14/2022 1329   AST 17 06/07/2022 1334   ALT 12 06/14/2022 1329   ALT 13 06/07/2022 1334   BILITOT 0.7 06/14/2022 1329   BILITOT 0.9 06/07/2022 1334       RADIOGRAPHIC STUDIES: DG CHEST PORT 1 VIEW  Result Date: 06/01/2022 CLINICAL DATA:  Hypoxia EXAM: PORTABLE CHEST 1 VIEW COMPARISON:  05/31/2022 FINDINGS: Cardiac shadow is stable. Left upper lobe mass lesion is again identified with adjacent pleural thickening stable from the previous day. Stable left medial lung base lesion is noted similar to that seen on prior CT. No new focal infiltrate or effusion is seen. IMPRESSION: Stable left lung masses.  No acute abnormality noted. Electronically Signed   By: Inez Catalina M.D.   On: 06/01/2022  19:48   ECHOCARDIOGRAM COMPLETE  Result Date: 06/01/2022    ECHOCARDIOGRAM REPORT   Patient Name:   Roger [name] Date of Exam: 06/01/2022 Medical Rec #:  [id number]   Height:       71.0 in Accession #:    [id number]  Weight:       165.3 lb Date of Birth:  July 14, 1955  BSA:          1.945 m Patient Age:    18 years    BP:           112/73 mmHg Patient Gender: M           HR:           71 bpm. Exam Location:  Inpatient Procedure: 2D Echo Indications:    pulmonary embolus  History:        Patient has no prior history of Echocardiogram examinations.                 COPD; Risk Factors:Dyslipidemia and Hypertension.  Sonographer:    Harvie Junior Referring Phys: [id number] [place]  1. Left ventricular ejection fraction, by estimation, is 50 to 55%. The left ventricle  has low normal function. The left ventricle demonstrates global hypokinesis. Left ventricular diastolic parameters are consistent with Grade I diastolic dysfunction (impaired relaxation). There is the interventricular septum is flattened in systole, consistent with right ventricular pressure overload.  2. Right ventricular systolic function is moderately reduced. The right ventricular size is moderately enlarged. There is moderately elevated pulmonary artery systolic pressure. The estimated right ventricular systolic pressure is 14.4 mmHg.  3. Right atrial size was moderately dilated.  4. A small pericardial effusion is present. There is no evidence of cardiac tamponade.  5. No evidence of mitral valve regurgitation.  6. The aortic valve is tricuspid. Aortic valve regurgitation is not visualized. Aortic valve sclerosis is present, with no evidence of aortic valve stenosis.  7. The inferior vena cava is dilated in size with >50% respiratory variability, suggesting right atrial pressure of 8 mmHg. Comparison(s): No prior Echocardiogram. Conclusion(s)/Recommendation(s): Signs of pulmonary hypertension. FINDINGS  Left Ventricle: Left ventricular ejection fraction, by estimation, is 50 to 55%. The left ventricle has low normal function. The left ventricle demonstrates global hypokinesis. The left ventricular internal cavity size was normal in size. There is no left ventricular hypertrophy. The interventricular septum is flattened in systole, consistent with right ventricular pressure overload. Left ventricular diastolic parameters are consistent with Grade I diastolic dysfunction (impaired relaxation). Right Ventricle: The right ventricular size is moderately enlarged. Right ventricular systolic function is moderately reduced. There is moderately elevated pulmonary artery systolic pressure. The tricuspid regurgitant velocity is 3.10 m/s, and with an assumed right atrial pressure of 8 mmHg, the estimated right ventricular  systolic pressure is 81.8 mmHg. Left Atrium: Left atrial size was normal in size. Right Atrium: Right atrial size was moderately dilated. Pericardium: A small pericardial effusion is present. There is no evidence of cardiac tamponade. Mitral Valve: No evidence of mitral valve regurgitation. Tricuspid Valve: Tricuspid valve regurgitation is trivial. Aortic Valve: The aortic valve is tricuspid. Aortic valve regurgitation is not visualized. Aortic valve sclerosis is present, with no evidence of aortic valve stenosis. Aortic valve mean gradient measures 3.0 mmHg. Aortic valve peak gradient measures 5.1  mmHg. Aortic valve area, by VTI measures 2.83 cm. Pulmonic Valve: Pulmonic valve regurgitation is not visualized. Aorta: The aortic root and ascending aorta are structurally normal, with no evidence of  dilitation. Venous: The inferior vena cava is dilated in size with greater than 50% respiratory variability, suggesting right atrial pressure of 8 mmHg. IAS/Shunts: No atrial level shunt detected by color flow Doppler.  LEFT VENTRICLE PLAX 2D LVIDd:         5.10 cm     Diastology LVIDs:         3.50 cm     LV e' medial:    5.55 cm/s LV PW:         0.80 cm     LV E/e' medial:  10.5 LV IVS:        0.80 cm     LV e' lateral:   8.49 cm/s LVOT diam:     2.10 cm     LV E/e' lateral: 6.9 LV SV:         62 LV SV Index:   32 LVOT Area:     3.46 cm  LV Volumes (MOD) LV vol d, MOD A2C: 58.1 ml LV vol d, MOD A4C: 67.6 ml LV vol s, MOD A2C: 32.2 ml LV vol s, MOD A4C: 30.0 ml LV SV MOD A2C:     25.9 ml LV SV MOD A4C:     67.6 ml LV SV MOD BP:      31.0 ml RIGHT VENTRICLE RV Basal diam:  5.00 cm RV Mid diam:    4.30 cm RV FAC:         15.9 % RV S prime:     6.74 cm/s TAPSE (M-mode): 1.6 cm LEFT ATRIUM             Index        RIGHT ATRIUM           Index LA diam:        3.30 cm 1.70 cm/m   RA Area:     19.50 cm LA Vol (A2C):   41.5 ml 21.34 ml/m  RA Volume:   65.10 ml  33.47 ml/m LA Vol (A4C):   38.9 ml 20.00 ml/m LA Biplane Vol:  43.2 ml 22.21 ml/m  AORTIC VALVE                    PULMONIC VALVE AV Area (Vmax):    2.81 cm     PV Vmax:       0.82 m/s AV Area (Vmean):   2.70 cm     PV Peak grad:  2.7 mmHg AV Area (VTI):     2.83 cm AV Vmax:           113.00 cm/s AV Vmean:          72.400 cm/s AV VTI:            0.220 m AV Peak Grad:      5.1 mmHg AV Mean Grad:      3.0 mmHg LVOT Vmax:         91.70 cm/s LVOT Vmean:        56.400 cm/s LVOT VTI:          0.180 m LVOT/AV VTI ratio: 0.82  AORTA Ao Root diam: 3.50 cm Ao Asc diam:  3.00 cm MITRAL VALVE               TRICUSPID VALVE MV Area (PHT): 4.60 cm    TR Peak grad:   38.4 mmHg MV Decel Time: 165 msec    TR Vmax:        310.00 cm/s MR Peak  grad: 50.1 mmHg MR Vmax:      354.00 cm/s  SHUNTS MV E velocity: 58.50 cm/s  Systemic VTI:  0.18 m MV A velocity: 63.20 cm/s  Systemic Diam: 2.10 cm MV E/A ratio:  0.93 Phineas Inches Electronically signed by Phineas Inches Signature Date/Time: 06/01/2022/2:02:16 PM    Final    CT Angio Chest PE W/Cm &/Or Wo Cm  Result Date: 05/31/2022 CLINICAL DATA:  Pulmonary embolism (PE) suspected, high prob. Hypoxia, known DVT. Stage IV primary bronchogenic carcinoma. EXAM: CT ANGIOGRAPHY CHEST WITH CONTRAST TECHNIQUE: Multidetector CT imaging of the chest was performed using the standard protocol during bolus administration of intravenous contrast. Multiplanar CT image reconstructions and MIPs were obtained to evaluate the vascular anatomy. RADIATION DOSE REDUCTION: This exam was performed according to the departmental dose-optimization program which includes automated exposure control, adjustment of the mA and/or kV according to patient size and/or use of iterative reconstruction technique. CONTRAST:  72m OMNIPAQUE IOHEXOL 350 MG/ML SOLN COMPARISON:  PET CT 03/24/2022, CT chest 04/15/2022 FINDINGS: Cardiovascular: There is adequate opacification of the pulmonary arterial tree. There are multiple intraluminal filling defects in keeping with acute pulmonary emboli  seen within the lobar and segmental pulmonary arteries of the right middle and lower lobes. The embolic burden is moderate. The central pulmonary arteries are stably enlarged in keeping with changes of pulmonary arterial hypertension. However, there is reversal the normal RV/LV ratio, now measuring 1.4, and right to left shift of the intraventricular septum in keeping with changes of right heart strain. Global cardiac size is within normal limits. Extensive coronary artery calcification. Small pericardial effusion has developed. Mild atherosclerotic calcification noted within the thoracic aorta. No aortic aneurysm. Mediastinum/Nodes: Progressive pathologic subcarinal adenopathy is seen with the index lymph node measuring 2.1 cm in short axis diameter. Additional subcentimeter left hilar and subcarinal lymph nodes are identified, previously demonstrated is hypermetabolic on PET CT examination of 03/24/2022. The visualized thyroid is unremarkable. The esophagus is unremarkable. Lungs/Pleura: Severe emphysema. Previously noted hypermetabolic masses within the left upper lobe, left lower lobe, and right lower lobe are again identified with central necrosis and cavitation within the dominant left lower lobe pulmonary mass again noted. There has developed superimposed mildly progressive bibasilar ground-glass pulmonary infiltrate which may relate to mild superimposed alveolar pulmonary edema, hemorrhage, or inflammatory infiltrate as can be seen with drug therapy. No pneumothorax. Interval development of small left pleural effusion. Upper Abdomen: No acute abnormality. Left upper quadrant anterior abdominal wall soft tissue mass in keeping with a abdominal wall metastasis again noted measuring 3.0 x 3.4 cm in axial # 184/4. Musculoskeletal: Mixed lytic and sclerotic osseous metastases are again identified within the T10 and T12 vertebral bodies and predominantly lytic metastases are identified within the right sixth rib  anteriorly, previously seen on prior PET CT examination. Review of the MIP images confirms the above findings. IMPRESSION: 1. Acute pulmonary emboli within the lobar and segmental pulmonary arteries of the right middle and lower lobes. The embolic burden is moderate. Positive for acute PE with CT evidence of right heart strain (RV/LV Ratio = 1.4 ) consistent with at least submassive (intermediate risk) PE. The presence of right heart strain has been associated with an increased risk of morbidity and mortality. Please refer to the "Code PE Focused" order set in EPIC. 2. Severe emphysema. 3. Extensive coronary artery calcification. 4. Interval development of a small pericardial effusion and small left pleural effusion. 5. Interval development of mildly progressive bibasilar ground-glass pulmonary infiltrate which are  favored to relate to mild superimposed alveolar pulmonary edema, given the associated findings. Additional differential considerations are as listed above, however. 6. Multiple pulmonary masses, progressive pathologic adenopathy, osseous, and body wall metastases again identified in keeping with the patient's known stage IV bronchogenic neoplasm Aortic Atherosclerosis (ICD10-I70.0) and Emphysema (ICD10-J43.9). These results were called by telephone at the time of interpretation on 05/31/2022 at 11:52 am to provider Carmin Muskrat , who verbally acknowledged these results. Electronically Signed   By: Fidela Salisbury M.D.   On: 05/31/2022 11:54   DG Chest Port 1 View  Result Date: 05/31/2022 CLINICAL DATA:  Hypoxia.  Lung cancer EXAM: PORTABLE CHEST 1 VIEW COMPARISON:  04/18/2022, 04/15/2022 FINDINGS: Stable heart size. Aortic atherosclerosis. Left upper lobe lung mass appears slightly increased in size compared to the prior radiograph. Additional retrocardiac mass in the left lower lobe is grossly unchanged. Known right lower lobe nodule is also similar in appearance to prior. Background of emphysema  and interstitial lung disease. No pleural effusion or pneumothorax. IMPRESSION: Left upper lobe lung mass appears slightly increased in size compared to the prior radiograph. Additional left lower lobe lung mass and right lower lobe nodules are not appreciably changed. Electronically Signed   By: Davina Poke D.O.   On: 05/31/2022 10:35   VAS [place] LOWER EXTREMITY VENOUS (DVT)  Result Date: 05/24/2022  Lower Venous DVT Study [name] [name]  Date of Exam:   05/24/2022 Medical Rec #: [id number]    Accession #:    [id number] Date of Birth: 1955/09/28   Patient Gender: M Patient Age:   32 years Exam Location:  [facility] Procedure:      VAS [place] LOWER EXTREMITY VENOUS (DVT) Referring Phys: Gery Pray --------------------------------------------------------------------------------  Indications: Swelling.  Risk Factors: Cancer. Comparison Study: No prior studies. Performing Technologist: Oliver Hum RVT  Examination Guidelines: A complete evaluation includes B-mode imaging, spectral Doppler, color Doppler, and power Doppler as needed of all accessible portions of each vessel. Bilateral testing is considered an integral part of a complete examination. Limited examinations for reoccurring indications may be performed as noted. The reflux portion of the exam is performed with the patient in reverse Trendelenburg.  +-----+---------------+---------+-----------+----------+--------------+ RIGHTCompressibilityPhasicitySpontaneityPropertiesThrombus Aging +-----+---------------+---------+-----------+----------+--------------+ CFV  Full           Yes      Yes                                 +-----+---------------+---------+-----------+----------+--------------+   +---------+---------------+---------+-----------+----------+--------------+ LEFT     CompressibilityPhasicitySpontaneityPropertiesThrombus Aging +---------+---------------+---------+-----------+----------+--------------+  CFV      Full           Yes      Yes                                 +---------+---------------+---------+-----------+----------+--------------+ SFJ      Full                                                        +---------+---------------+---------+-----------+----------+--------------+ FV Prox  Full                                                        +---------+---------------+---------+-----------+----------+--------------+  FV Mid   Full                                                        +---------+---------------+---------+-----------+----------+--------------+ FV DistalFull                                                        +---------+---------------+---------+-----------+----------+--------------+ PFV      Full                                                        +---------+---------------+---------+-----------+----------+--------------+ POP      Full           Yes      Yes                                 +---------+---------------+---------+-----------+----------+--------------+ PTV      Partial                                      Acute          +---------+---------------+---------+-----------+----------+--------------+ PERO     None                                         Acute          +---------+---------------+---------+-----------+----------+--------------+ Soleal   Partial                                      Acute          +---------+---------------+---------+-----------+----------+--------------+ Gastroc  Partial                                      Acute          +---------+---------------+---------+-----------+----------+--------------+ GSV      None                                         Acute          +---------+---------------+---------+-----------+----------+--------------+    Summary: RIGHT: - No evidence of common femoral vein obstruction.  LEFT: - Findings consistent with acute deep vein  thrombosis involving the left posterior tibial veins, left peroneal veins, left gastrocnemius veins, and left soleal veins. - No cystic structure found in the popliteal fossa.  *See table(s) above for measurements and observations. Electronically signed by Deitra Mayo MD on 05/24/2022 at 4:45:09 PM.    Final     ASSESSMENT AND PLAN: This is a very pleasant 66 years old white  male with Stage IV (T3, N3, M1 C) non-small cell lung cancer, squamous cell carcinoma presented with bilateral pulmonary masses and nodules as well as mediastinal and bilateral hilar adenopathy in addition to subcutaneous metastatic disease and bone metastasis diagnosed in October 2023 with PD-L1 expression of 1% and no actionable mutations.  The patient is status post palliative radiotherapy to the painful abdominal lesion under the care of Dr. Sondra Come. He is currently on systemic chemotherapy with carboplatin for AUC of 5, paclitaxel 175 Mg/M2 and Keytruda 200 Mg IV every 3 weeks with Neulasta support status post 1 cycle which was given on May 09, 2022 and he was lost to follow-up for the last 6 weeks because he missed several appointments He was also diagnosed with acute pulmonary embolism and currently on treatment with Eliquis. I had a lengthy discussion with the patient today about his condition and treatment options.  He was given the option of resuming his treatment versus consideration of palliative care and hospice.  He is interested in treatment but would like to hold the start of cycle #2 until after Christmas. I will arrange for the patient to start the next cycle of his treatment next week. I will see him back for follow-up visit in 4 weeks for evaluation with the start of cycle #3. For the history of pulmonary embolism, he will continue his current treatment with Eliquis. The patient was advised to call immediately if he has any other concerning symptoms in the interval. The patient voices understanding of  current disease status and treatment options and is in agreement with the current care plan.  All questions were answered. The patient knows to call the clinic with any problems, questions or concerns. We can certainly see the patient much sooner if necessary.  The total time spent in the appointment was 30 minutes.  Disclaimer: This note was dictated with voice recognition software. Similar sounding words can inadvertently be transcribed and may not be corrected upon review.

## 2022-06-21 NOTE — Telephone Encounter (Signed)
LVM to return my call re missed appt today.

## 2022-06-22 ENCOUNTER — Inpatient Hospital Stay: Payer: BC Managed Care – PPO

## 2022-06-22 ENCOUNTER — Ambulatory Visit: Payer: BC Managed Care – PPO

## 2022-06-22 ENCOUNTER — Ambulatory Visit
Admission: RE | Admit: 2022-06-22 | Discharge: 2022-06-22 | Disposition: A | Discharge status: Home / Self Care | Payer: BC Managed Care – PPO | Source: Ambulatory Visit | Attending: Radiation Oncology | Admitting: Radiation Oncology

## 2022-06-22 ENCOUNTER — Other Ambulatory Visit: Payer: Self-pay

## 2022-06-22 DIAGNOSIS — C3432 Malignant neoplasm of lower lobe, left bronchus or lung: Secondary | ICD-10-CM | POA: Diagnosis not present

## 2022-06-22 LAB — TSH: TSH: 3.324 u[IU]/mL (ref 0.350–4.500)

## 2022-06-22 LAB — RAD ONC ARIA SESSION SUMMARY
Course Elapsed Days: 14
Plan Fractions Treated to Date: 10
Plan Prescribed Dose Per Fraction: 2.5 Gy
Plan Total Fractions Prescribed: 14
Plan Total Prescribed Dose: 35 Gy
Reference Point Dosage Given to Date: 25 Gy
Reference Point Session Dosage Given: 2.5 Gy
Session Number: 10

## 2022-06-22 LAB — T4: T4, Total: 7.9 ug/dL (ref 4.5–12.0)

## 2022-06-23 ENCOUNTER — Other Ambulatory Visit: Payer: Self-pay

## 2022-06-23 ENCOUNTER — Ambulatory Visit
Admission: RE | Admit: 2022-06-23 | Discharge: 2022-06-23 | Disposition: A | Discharge status: Home / Self Care | Payer: BC Managed Care – PPO | Source: Ambulatory Visit | Attending: Radiation Oncology | Admitting: Radiation Oncology

## 2022-06-23 ENCOUNTER — Inpatient Hospital Stay: Payer: BC Managed Care – PPO

## 2022-06-23 ENCOUNTER — Ambulatory Visit: Payer: BC Managed Care – PPO

## 2022-06-23 ENCOUNTER — Telehealth: Payer: Self-pay | Admitting: Internal Medicine

## 2022-06-23 DIAGNOSIS — C3432 Malignant neoplasm of lower lobe, left bronchus or lung: Secondary | ICD-10-CM | POA: Diagnosis not present

## 2022-06-23 LAB — RAD ONC ARIA SESSION SUMMARY
Course Elapsed Days: 15
Plan Fractions Treated to Date: 11
Plan Prescribed Dose Per Fraction: 2.5 Gy
Plan Total Fractions Prescribed: 14
Plan Total Prescribed Dose: 35 Gy
Reference Point Dosage Given to Date: 27.5 Gy
Reference Point Session Dosage Given: 2.5 Gy
Session Number: 11

## 2022-06-23 NOTE — Telephone Encounter (Signed)
Called patient regarding all upcoming December, January and February appointments. Left a voicemail.

## 2022-06-24 ENCOUNTER — Other Ambulatory Visit: Payer: Self-pay

## 2022-06-24 ENCOUNTER — Ambulatory Visit
Admission: RE | Admit: 2022-06-24 | Discharge: 2022-06-24 | Disposition: A | Discharge status: Home / Self Care | Payer: BC Managed Care – PPO | Source: Ambulatory Visit | Attending: Radiation Oncology | Admitting: Radiation Oncology

## 2022-06-24 ENCOUNTER — Ambulatory Visit: Payer: BC Managed Care – PPO

## 2022-06-24 DIAGNOSIS — C3432 Malignant neoplasm of lower lobe, left bronchus or lung: Secondary | ICD-10-CM | POA: Diagnosis not present

## 2022-06-24 LAB — RAD ONC ARIA SESSION SUMMARY
Course Elapsed Days: 16
Plan Fractions Treated to Date: 12
Plan Prescribed Dose Per Fraction: 2.5 Gy
Plan Total Fractions Prescribed: 14
Plan Total Prescribed Dose: 35 Gy
Reference Point Dosage Given to Date: 30 Gy
Reference Point Session Dosage Given: 2.5 Gy
Session Number: 12

## 2022-06-27 ENCOUNTER — Ambulatory Visit: Payer: BC Managed Care – PPO

## 2022-06-28 ENCOUNTER — Ambulatory Visit: Payer: BC Managed Care – PPO

## 2022-06-28 ENCOUNTER — Inpatient Hospital Stay: Payer: BC Managed Care – PPO

## 2022-06-28 ENCOUNTER — Other Ambulatory Visit: Payer: BC Managed Care – PPO

## 2022-06-28 ENCOUNTER — Telehealth: Payer: Self-pay

## 2022-06-28 NOTE — Telephone Encounter (Signed)
This nurse received a call from this patient requesting to know when he is supposed to come in for treatment.  This nurse informed patient of his next scheduled appointments for Thursday 12/28.  Patient acknowledged understanding.  No further questions or concerns.

## 2022-06-29 ENCOUNTER — Ambulatory Visit: Payer: BC Managed Care – PPO

## 2022-06-29 ENCOUNTER — Ambulatory Visit
Admission: RE | Admit: 2022-06-29 | Discharge: 2022-06-29 | Disposition: A | Discharge status: Home / Self Care | Payer: BC Managed Care – PPO | Source: Ambulatory Visit | Attending: Radiation Oncology | Admitting: Radiation Oncology

## 2022-06-29 ENCOUNTER — Other Ambulatory Visit: Payer: Self-pay

## 2022-06-29 ENCOUNTER — Telehealth: Payer: Self-pay | Admitting: Internal Medicine

## 2022-06-29 DIAGNOSIS — C3432 Malignant neoplasm of lower lobe, left bronchus or lung: Secondary | ICD-10-CM | POA: Diagnosis not present

## 2022-06-29 LAB — RAD ONC ARIA SESSION SUMMARY
Course Elapsed Days: 21
Plan Fractions Treated to Date: 13
Plan Prescribed Dose Per Fraction: 2.5 Gy
Plan Total Fractions Prescribed: 14
Plan Total Prescribed Dose: 35 Gy
Reference Point Dosage Given to Date: 32.5 Gy
Reference Point Session Dosage Given: 2.5 Gy
Session Number: 13

## 2022-06-29 MED FILL — Fosaprepitant Dimeglumine For IV Infusion 150 MG (Base Eq): INTRAVENOUS | Qty: 5 | Status: AC

## 2022-06-29 MED FILL — Dexamethasone Sodium Phosphate Inj 100 MG/10ML: INTRAMUSCULAR | Qty: 1 | Status: AC

## 2022-06-29 NOTE — Telephone Encounter (Signed)
Called patient regarding upcoming December, January and February appointments. Left a voicemail.

## 2022-06-30 ENCOUNTER — Other Ambulatory Visit: Payer: Self-pay | Admitting: Physician Assistant

## 2022-06-30 ENCOUNTER — Ambulatory Visit
Admission: RE | Admit: 2022-06-30 | Discharge: 2022-06-30 | Disposition: A | Discharge status: Home / Self Care | Payer: BC Managed Care – PPO | Source: Ambulatory Visit | Attending: Radiation Oncology | Admitting: Radiation Oncology

## 2022-06-30 ENCOUNTER — Inpatient Hospital Stay: Payer: BC Managed Care – PPO

## 2022-06-30 ENCOUNTER — Other Ambulatory Visit: Payer: Self-pay

## 2022-06-30 DIAGNOSIS — C3432 Malignant neoplasm of lower lobe, left bronchus or lung: Secondary | ICD-10-CM | POA: Diagnosis not present

## 2022-06-30 LAB — RAD ONC ARIA SESSION SUMMARY
Course Elapsed Days: 22
Plan Fractions Treated to Date: 14
Plan Prescribed Dose Per Fraction: 2.5 Gy
Plan Total Fractions Prescribed: 14
Plan Total Prescribed Dose: 35 Gy
Reference Point Dosage Given to Date: 35 Gy
Reference Point Session Dosage Given: 2.5 Gy
Session Number: 14

## 2022-07-01 ENCOUNTER — Ambulatory Visit: Payer: BC Managed Care – PPO

## 2022-07-01 ENCOUNTER — Telehealth: Payer: Self-pay | Admitting: Internal Medicine

## 2022-07-01 ENCOUNTER — Other Ambulatory Visit: Payer: BC Managed Care – PPO

## 2022-07-01 NOTE — Telephone Encounter (Signed)
Called patient regarding new rescheduled appointments, left a voicemail.

## 2022-07-02 ENCOUNTER — Inpatient Hospital Stay: Payer: BC Managed Care – PPO

## 2022-07-02 ENCOUNTER — Other Ambulatory Visit: Payer: Self-pay

## 2022-07-05 ENCOUNTER — Inpatient Hospital Stay: Payer: BC Managed Care – PPO

## 2022-07-05 ENCOUNTER — Other Ambulatory Visit: Payer: BC Managed Care – PPO

## 2022-07-06 ENCOUNTER — Telehealth: Payer: Self-pay | Admitting: Medical Oncology

## 2022-07-06 ENCOUNTER — Inpatient Hospital Stay: Payer: BC Managed Care – PPO

## 2022-07-06 MED FILL — Fosaprepitant Dimeglumine For IV Infusion 150 MG (Base Eq): INTRAVENOUS | Qty: 5 | Status: AC

## 2022-07-06 MED FILL — Dexamethasone Sodium Phosphate Inj 100 MG/10ML: INTRAMUSCULAR | Qty: 1 | Status: AC

## 2022-07-06 NOTE — Telephone Encounter (Signed)
Mailed pt appts to [facility].

## 2022-07-07 ENCOUNTER — Telehealth: Payer: Self-pay | Admitting: Medical Oncology

## 2022-07-07 ENCOUNTER — Other Ambulatory Visit: Payer: BC Managed Care – PPO

## 2022-07-07 ENCOUNTER — Inpatient Hospital Stay: Payer: BC Managed Care – PPO

## 2022-07-07 NOTE — Telephone Encounter (Signed)
Pt cancelled appts for today due to : Nausea , no appetite, no taste. Drinking protein drinks. He feels weak and is staying in bed a lot .   He asked for appt for labs and [facility] tomorrow .   He would like an appetite stimulant .

## 2022-07-09 ENCOUNTER — Inpatient Hospital Stay: Payer: BC Managed Care – PPO

## 2022-07-12 ENCOUNTER — Ambulatory Visit: Payer: BC Managed Care – PPO | Admitting: Internal Medicine

## 2022-07-12 ENCOUNTER — Other Ambulatory Visit: Payer: BC Managed Care – PPO

## 2022-07-12 ENCOUNTER — Encounter: Payer: BC Managed Care – PPO | Admitting: Dietician

## 2022-07-12 ENCOUNTER — Ambulatory Visit: Payer: BC Managed Care – PPO

## 2022-07-12 NOTE — Radiation Completion Notes (Signed)
Patient Name: Roger [name], SNEE MRN: [id number] Date of Birth: 1956-05-06 Referring Physician: Curt Bears, M.D. Date of Service: 2022-07-12 Radiation Oncologist: Teryl Lucy, M.D. 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   ImpPlan 2022-06-07 The patient is tolerating radiation. Continue treatment as planned.   PhysExam 2022-06-07 Alert, no acute distress.   ProgNote 2022-06-07 Need refills: [  ] Additional  Weekly Progress Notes [ Patient requesting to speak with Dr. Sondra Come prior to treatment.  ]    PatEd 2022-06-08 Ongoing education performed.   ImpPlan 2022-06-08 The patient is tolerating radiation. Continue treatment as planned.   PhysExam 2022-06-08 Alert, no acute distress.   ProgNote 2022-06-08 Need refills: [  ] Additional  Weekly Progress Notes [ Patient tolerated treatment well.   ]    RunningNotes 2022-06-08 Education and sonafine provided on 06/08/22- NS   PatEd 2022-06-14 Ongoing education performed.   ImpPlan 2022-06-14 The patient is tolerating radiation. Continue treatment as planned.   PhysExam 2022-06-14 Alert, no acute distress.    PatEd 2022-06-15 Ongoing education performed.   ImpPlan 2022-06-15 The patient is tolerating radiation. Continue treatment as planned.   PhysExam 2022-06-15 Alert, no acute distress.   PatEd 2022-06-21 Ongoing education performed.   ImpPlan 2022-06-21 The patient is tolerating radiation. Continue treatment as planned.   PhysExam 2022-06-21 Alert, no acute distress.   ProgNote 2022-06-21 Need refills: [  ] Additional  Weekly Progress Notes [ Patient was seen on the treatment machine by Dr. Sondra Come. ]    PatEd 2022-06-22 Ongoing education performed.   ImpPlan 2022-06-22 The patient is tolerating radiation. Continue treatment as planned.   PhysExam 2022-06-22 Alert, no acute distress.   PatEd 2022-06-29 Ongoing education performed.   ImpPlan 2022-06-29 The patient is tolerating radiation. Continue treatment as planned.   PhysExam 2022-06-29 Alert, no acute distress.   ProgNote 2022-06-29 Changes from last week/visit? [ No ] Pain? [ Yes 4/10 ] Fatigue? [ Yes ] Skin irritation? [ No ] Nausea/Vomiting/Diarrhea? [ No ] Need refills: [ No ] Additional  Weekly Progress Notes [  ]

## 2022-07-13 ENCOUNTER — Other Ambulatory Visit: Payer: Self-pay

## 2022-07-13 ENCOUNTER — Inpatient Hospital Stay: Payer: BC Managed Care – PPO | Attending: Internal Medicine

## 2022-07-13 DIAGNOSIS — C3481 Malignant neoplasm of overlapping sites of right bronchus and lung: Secondary | ICD-10-CM | POA: Insufficient documentation

## 2022-07-13 DIAGNOSIS — Z79899 Other long term (current) drug therapy: Secondary | ICD-10-CM | POA: Diagnosis not present

## 2022-07-13 DIAGNOSIS — Z923 Personal history of irradiation: Secondary | ICD-10-CM | POA: Insufficient documentation

## 2022-07-13 DIAGNOSIS — Z7984 Long term (current) use of oral hypoglycemic drugs: Secondary | ICD-10-CM | POA: Diagnosis not present

## 2022-07-13 DIAGNOSIS — I10 Essential (primary) hypertension: Secondary | ICD-10-CM | POA: Insufficient documentation

## 2022-07-13 DIAGNOSIS — Z7901 Long term (current) use of anticoagulants: Secondary | ICD-10-CM | POA: Diagnosis not present

## 2022-07-13 DIAGNOSIS — C3492 Malignant neoplasm of unspecified part of left bronchus or lung: Secondary | ICD-10-CM

## 2022-07-13 DIAGNOSIS — C7951 Secondary malignant neoplasm of bone: Secondary | ICD-10-CM | POA: Insufficient documentation

## 2022-07-13 DIAGNOSIS — Z86711 Personal history of pulmonary embolism: Secondary | ICD-10-CM | POA: Diagnosis not present

## 2022-07-13 LAB — CMP (CANCER CENTER ONLY)
ALT: 15 U/L (ref 0–44)
AST: 19 U/L (ref 15–41)
Albumin: 3.8 g/dL (ref 3.5–5.0)
Alkaline Phosphatase: 103 U/L (ref 38–126)
Anion gap: 9 (ref 5–15)
BUN: 16 mg/dL (ref 8–23)
CO2: 26 mmol/L (ref 22–32)
Calcium: 11 mg/dL — ABNORMAL HIGH (ref 8.9–10.3)
Chloride: 99 mmol/L (ref 98–111)
Creatinine: 0.83 mg/dL (ref 0.61–1.24)
GFR, Estimated: >60 mL/min (ref >60)
Glucose, Bld: 249 mg/dL — ABNORMAL HIGH (ref 70–99)
Potassium: 4.3 mmol/L (ref 3.5–5.1)
Sodium: 134 mmol/L — ABNORMAL LOW (ref 135–145)
Total Bilirubin: 0.5 mg/dL (ref 0.3–1.2)
Total Protein: 8.7 g/dL — ABNORMAL HIGH (ref 6.5–8.1)

## 2022-07-13 LAB — CBC WITH DIFFERENTIAL (CANCER CENTER ONLY)
Abs Immature Granulocytes: 0.04 10*3/uL (ref 0.00–0.07)
Basophils Absolute: 0.1 10*3/uL (ref 0.0–0.1)
Basophils Relative: 1 %
Eosinophils Absolute: 0.1 10*3/uL (ref 0.0–0.5)
Eosinophils Relative: 1 %
HCT: 40.4 % (ref 39.0–52.0)
Hemoglobin: 12.9 g/dL — ABNORMAL LOW (ref 13.0–17.0)
Immature Granulocytes: 0 %
Lymphocytes Relative: 6 %
Lymphs Abs: 0.6 10*3/uL — ABNORMAL LOW (ref 0.7–4.0)
MCH: 29.1 pg (ref 26.0–34.0)
MCHC: 31.9 g/dL (ref 30.0–36.0)
MCV: 91.2 fL (ref 80.0–100.0)
Monocytes Absolute: 0.5 10*3/uL (ref 0.1–1.0)
Monocytes Relative: 5 %
Neutro Abs: 8.9 10*3/uL — ABNORMAL HIGH (ref 1.7–7.7)
Neutrophils Relative %: 87 %
Platelet Count: 310 10*3/uL (ref 150–400)
RBC: 4.43 MIL/uL (ref 4.22–5.81)
RDW: 15.4 % (ref 11.5–15.5)
WBC Count: 10.2 10*3/uL (ref 4.0–10.5)
nRBC: 0 % (ref 0.0–0.2)

## 2022-07-14 ENCOUNTER — Ambulatory Visit: Payer: BC Managed Care – PPO

## 2022-07-19 ENCOUNTER — Inpatient Hospital Stay: Payer: BC Managed Care – PPO

## 2022-07-19 ENCOUNTER — Inpatient Hospital Stay: Payer: BC Managed Care – PPO | Admitting: Physician Assistant

## 2022-07-20 ENCOUNTER — Telehealth: Payer: Self-pay | Admitting: Medical Oncology

## 2022-07-20 ENCOUNTER — Inpatient Hospital Stay: Payer: BC Managed Care – PPO

## 2022-07-20 NOTE — Telephone Encounter (Signed)
Lab appt cancelled today. Next appts confirmed with pt.

## 2022-07-21 ENCOUNTER — Inpatient Hospital Stay: Payer: BC Managed Care – PPO

## 2022-07-21 NOTE — Progress Notes (Incomplete)
Roger [name] is here today for follow up post radiation to the lung.  Lung Side: left lung  Does the patient complain of any of the following: Pain:*** Shortness of breath w/wo exertion: *** Cough: *** Hemoptysis: *** Pain with swallowing: *** Swallowing/choking concerns: *** Appetite: *** Energy Level: *** Post radiation skin Changes: ***    Additional comments if applicable:

## 2022-07-26 ENCOUNTER — Other Ambulatory Visit: Payer: BC Managed Care – PPO

## 2022-07-26 ENCOUNTER — Encounter: Payer: Self-pay | Admitting: Radiation Oncology

## 2022-07-26 MED FILL — Dexamethasone Sodium Phosphate Inj 100 MG/10ML: INTRAMUSCULAR | Qty: 1 | Status: AC

## 2022-07-26 MED FILL — Fosaprepitant Dimeglumine For IV Infusion 150 MG (Base Eq): INTRAVENOUS | Qty: 5 | Status: AC

## 2022-07-27 ENCOUNTER — Other Ambulatory Visit: Payer: Self-pay

## 2022-07-27 ENCOUNTER — Inpatient Hospital Stay: Payer: BC Managed Care – PPO

## 2022-07-27 ENCOUNTER — Inpatient Hospital Stay: Payer: BC Managed Care – PPO | Admitting: Internal Medicine

## 2022-07-27 ENCOUNTER — Telehealth: Payer: Self-pay | Admitting: Internal Medicine

## 2022-07-27 ENCOUNTER — Inpatient Hospital Stay: Payer: BC Managed Care – PPO | Admitting: Dietician

## 2022-07-27 VITALS — BP 111/78 | HR 77 | Temp 97.4°F | Resp 16 | Wt 135.6 lb

## 2022-07-27 DIAGNOSIS — C3492 Malignant neoplasm of unspecified part of left bronchus or lung: Secondary | ICD-10-CM

## 2022-07-27 DIAGNOSIS — C3490 Malignant neoplasm of unspecified part of unspecified bronchus or lung: Secondary | ICD-10-CM | POA: Diagnosis not present

## 2022-07-27 DIAGNOSIS — C3481 Malignant neoplasm of overlapping sites of right bronchus and lung: Secondary | ICD-10-CM | POA: Diagnosis not present

## 2022-07-27 LAB — CBC WITH DIFFERENTIAL/PLATELET
Abs Immature Granulocytes: 0.07 10*3/uL (ref 0.00–0.07)
Basophils Absolute: 0.1 10*3/uL (ref 0.0–0.1)
Basophils Relative: 1 %
Eosinophils Absolute: 0.2 10*3/uL (ref 0.0–0.5)
Eosinophils Relative: 1 %
HCT: 39 % (ref 39.0–52.0)
Hemoglobin: 12.4 g/dL — ABNORMAL LOW (ref 13.0–17.0)
Immature Granulocytes: 1 %
Lymphocytes Relative: 9 %
Lymphs Abs: 1.1 10*3/uL (ref 0.7–4.0)
MCH: 28.8 pg (ref 26.0–34.0)
MCHC: 31.8 g/dL (ref 30.0–36.0)
MCV: 90.7 fL (ref 80.0–100.0)
Monocytes Absolute: 0.8 10*3/uL (ref 0.1–1.0)
Monocytes Relative: 6 %
Neutro Abs: 10.2 10*3/uL — ABNORMAL HIGH (ref 1.7–7.7)
Neutrophils Relative %: 82 %
Platelets: 318 10*3/uL (ref 150–400)
RBC: 4.3 MIL/uL (ref 4.22–5.81)
RDW: 15.4 % (ref 11.5–15.5)
WBC: 12.3 10*3/uL — ABNORMAL HIGH (ref 4.0–10.5)
nRBC: 0 % (ref 0.0–0.2)

## 2022-07-27 LAB — COMPREHENSIVE METABOLIC PANEL
ALT: 11 U/L (ref 0–44)
AST: 15 U/L (ref 15–41)
Albumin: 3.3 g/dL — ABNORMAL LOW (ref 3.5–5.0)
Alkaline Phosphatase: 118 U/L (ref 38–126)
Anion gap: 10 (ref 5–15)
BUN: 12 mg/dL (ref 8–23)
CO2: 25 mmol/L (ref 22–32)
Calcium: 10.4 mg/dL — ABNORMAL HIGH (ref 8.9–10.3)
Chloride: 99 mmol/L (ref 98–111)
Creatinine, Ser: 0.71 mg/dL (ref 0.61–1.24)
GFR, Estimated: >60 mL/min (ref >60)
Glucose, Bld: 249 mg/dL — ABNORMAL HIGH (ref 70–99)
Potassium: 3.5 mmol/L (ref 3.5–5.1)
Sodium: 134 mmol/L — ABNORMAL LOW (ref 135–145)
Total Bilirubin: 0.5 mg/dL (ref 0.3–1.2)
Total Protein: 8.3 g/dL — ABNORMAL HIGH (ref 6.5–8.1)

## 2022-07-27 NOTE — Progress Notes (Incomplete)
Radiation Oncology         [phone] ________________________________  Name: Roger [name] MRN: [id number]  Date: 07/28/2022  DOB: 1955/07/22  Follow-Up Visit Note  CC: Aletha Halim., PA-C  Curt Bears, MD  No diagnosis found.  Diagnosis: The encounter diagnosis was Primary squamous cell carcinoma of left lung ([place]).   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: presented with bilateral pulmonary masses and nodules as well as mediastinal and bilateral hilar adenopathy in addition to subcutaneous metastatic disease and bone metastasis diagnosed in October 2023   Cancer Staging  Primary squamous cell carcinoma of left lung (HCC) Staging form: Lung, AJCC 8th Edition - Clinical: Stage IVB (cT3, cN3, cM1c) - Signed by Curt Bears, MD on 05/03/2022  Interval Since Last Radiation: 28 days   Intent: Palliative  Radiation Treatment Dates: 06/08/2022 through 06/30/2022 Site Technique Total Dose (Gy) Dose per Fx (Gy) Completed Fx Beam Energies  Abdomen: Abd 3D 35/35 2.5 14/14 6X, 10X, 15X   Narrative:  The patient returns today for routine follow-up. The patient tolerated radiation therapy relatively well. During his final weekly treatment check on 06/29/22, the patient reported pain (rated at a 4/10), and fatigue. He denied any other side effects from treatment. Physical exam performed that day showed some erythema to the treatment site and further shrinkage of the tumor nodule. No skin breakdown was appreciated, or drainage.   In the interval since his initial consultation date, the patient received his first cycle of palliative chemotherapy consisting of carboplatin, paclitaxel and Keytruda (with Neulasta) on 05/09/22 (Dr. Julien Nordmann).   Prior to initiating radiation therapy, the patient was hospitalized from 05/31/22 through 06/03/22 for management of acute PE. The morning of his admission, the [facility]  chemotherapy infusion. Vitals performed pre-treatment showed O2 sats in the low 80's. Subsequently, his infusion was canceled and was sent to the ED. CTA of the chest performed while inpatient showed findings consistent with moderate PE and evidence of right heart strain. Hospital course included supplemental O2, and heparin which was later transitioned to eliquis (which the patient remains on).    The patient has not yet resumed chemotherapy due to feeling poorly for the last several months since his hospitalization. During his most recent follow-up visit with Dr. Julien Nordmann yesterday, the patient expressed that he is still very interested in completing the remainder of his systemic treatment once he has regained his strength                              Pertinent imaging performed in the interval since his initial consultation date includes: an MRI of the brain with and without contrast on 05/19/22 which showed no evidence of intracranial metastatic disease.   Allergies:  is allergic to demerol, erythromycin, prednisone, sulfa antibiotics, and augmentin [amoxicillin-pot clavulanate].  Meds: Current Outpatient Medications  Medication Sig Dispense Refill   albuterol (PROVENTIL HFA;VENTOLIN HFA) 108 (90 Base) MCG/ACT inhaler Inhale 2 puffs into the lungs every 6 (six) hours as needed for wheezing or shortness of breath.     ALPRAZolam (XANAX) 1 MG tablet Take 1 mg by mouth 5 (five) times daily. 8-12-4-8- float last pill     apixaban (ELIQUIS) 5 MG TABS tablet Take 2 tablets (10mg ) twice daily for 7 days, then 1 tablet (5mg ) twice daily (Patient taking differently: Take 10 mg by mouth 2 (two)  times daily.) 60 tablet 0   apixaban (ELIQUIS) 5 MG TABS tablet Take 1 tablet (5 mg total) by mouth 2 (two) times daily. 60 tablet 2   atenolol (TENORMIN) 25 MG tablet Take 0.5 tablets (12.5 mg total) by mouth daily. 15 tablet 0   atorvastatin (LIPITOR) 10 MG tablet Take 10 mg by mouth every evening.     fluticasone  (FLONASE) 50 MCG/ACT nasal spray Place 2 sprays into both nostrils daily as needed for allergies.     lidocaine-prilocaine (EMLA) cream Apply to the Port-A-Cath site 30-60 minutes before treatment. 30 g 0   metFORMIN (GLUCOPHAGE-XR) 500 MG 24 hr tablet Take 500 mg by mouth every evening.     mirtazapine (REMERON) 15 MG tablet Take 1 tablet (15 mg total) by mouth at bedtime. 30 tablet 2   prochlorperazine (COMPAZINE) 10 MG tablet Take 1 tablet (10 mg total) by mouth every 6 (six) hours as needed for nausea or vomiting. 30 tablet 0   No current facility-administered medications for this encounter.    Physical Findings: The patient is in no acute distress. Patient is alert and oriented.  vitals were not taken for this visit. .  No significant changes. Lungs are clear to auscultation bilaterally. Heart has regular rate and rhythm. No palpable cervical, supraclavicular, or axillary adenopathy. Abdomen soft, non-tender, normal bowel sounds.   Lab Findings: Lab Results  Component Value Date   WBC 12.3 (H) 07/27/2022   HGB 12.4 (L) 07/27/2022   HCT 39.0 07/27/2022   MCV 90.7 07/27/2022   PLT 318 07/27/2022    Radiographic Findings: No results found.  Impression: The encounter diagnosis was Primary squamous cell carcinoma of left lung ([place]).   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  ***  Plan:  ***   *** minutes of total time was spent for this patient encounter, including preparation, face-to-face counseling with the patient and coordination of care, physical exam, and documentation of the encounter. ____________________________________  Blair Promise, PhD, MD  This document serves as a record of  services personally performed by Gery Pray, MD. It was created on his behalf by Roney Mans, a trained medical scribe. The creation of this record is based on the scribe's personal observations and the provider's statements to them. This document has been checked and approved by the attending provider.

## 2022-07-27 NOTE — Progress Notes (Signed)
[facility] [phone]) [phone]   [phone]  OFFICE PROGRESS NOTE  Aletha Halim., PA-C [address] [place] [address]  DIAGNOSIS:  Stage IV (T3, N3, M1 C) non-small cell lung cancer, squamous cell carcinoma presented with bilateral pulmonary masses and nodules as well as mediastinal and bilateral hilar adenopathy in addition to subcutaneous metastatic disease and bone metastasis diagnosed in October 2023 with PD-L1 expression of 1%.    He has no actionable mutations.    PRIOR THERAPY: None   CURRENT THERAPY: 1) Palliative systemic chemotherapy with carboplatin for AUC of 5, paclitaxel 175 Mg/M2 and Keytruda 200 Mg IV every 3 weeks with Neulasta. Status post 1 cycle. First dose on 05/09/22 2) Palliative radiation to the painful abdominal lesion under the care of Dr. Sondra Come. Simulation scheduled for 05/23/22  INTERVAL HISTORY: Roger [name] 67 y.o. male returns to the clinic today for follow-up visit accompanied by family member.  The patient is feeling fine today with no concerning complaints except for the generalized fatigue and weakness.  He also has shortness of breath at baseline increased with exertion and he is currently on home oxygen.  He missed his systemic chemotherapy for the last 2 months because he was not feeling well enough to start treatment.  He completed a course of palliative radiotherapy to the painful abdominal lesion.  He denied having any current chest pain cough or hemoptysis.  He has no nausea, vomiting, diarrhea or constipation.  He has no headache or visual changes.  He is here today for evaluation and discussion of his treatment options.  MEDICAL HISTORY: Past Medical History:  Diagnosis Date   Anxiety    Asthma    COPD (chronic obstructive pulmonary disease) ([place])    Depression    Hemorrhoids    History of radiation therapy    Abdomen- 06/08/22-06/30/22- Dr. Gery Pray   History of rectal bleeding     Hypertension    Palpitations    Pneumonia    in 2015   Post-operative nausea and vomiting    pt unsure   Primary squamous cell carcinoma of left lung ([place]) 05/03/2022    ALLERGIES:  is allergic to demerol, erythromycin, prednisone, sulfa antibiotics, and augmentin [amoxicillin-pot clavulanate].  MEDICATIONS:  Current Outpatient Medications  Medication Sig Dispense Refill   albuterol (PROVENTIL HFA;VENTOLIN HFA) 108 (90 Base) MCG/ACT inhaler Inhale 2 puffs into the lungs every 6 (six) hours as needed for wheezing or shortness of breath.     ALPRAZolam (XANAX) 1 MG tablet Take 1 mg by mouth 5 (five) times daily. 8-12-4-8- float last pill     apixaban (ELIQUIS) 5 MG TABS tablet Take 2 tablets (10mg ) twice daily for 7 days, then 1 tablet (5mg ) twice daily (Patient taking differently: Take 10 mg by mouth 2 (two) times daily.) 60 tablet 0   apixaban (ELIQUIS) 5 MG TABS tablet Take 1 tablet (5 mg total) by mouth 2 (two) times daily. 60 tablet 2   atenolol (TENORMIN) 25 MG tablet Take 0.5 tablets (12.5 mg total) by mouth daily. 15 tablet 0   atorvastatin (LIPITOR) 10 MG tablet Take 10 mg by mouth every evening.     fluticasone (FLONASE) 50 MCG/ACT nasal spray Place 2 sprays into both nostrils daily as needed for allergies.     lidocaine-prilocaine (EMLA) cream Apply to the Port-A-Cath site 30-60 minutes before treatment. 30 g 0   metFORMIN (GLUCOPHAGE-XR) 500 MG 24 hr tablet Take 500 mg  by mouth every evening.     mirtazapine (REMERON) 15 MG tablet Take 1 tablet (15 mg total) by mouth at bedtime. 30 tablet 2   prochlorperazine (COMPAZINE) 10 MG tablet Take 1 tablet (10 mg total) by mouth every 6 (six) hours as needed for nausea or vomiting. 30 tablet 0   No current facility-administered medications for this visit.    SURGICAL HISTORY:  Past Surgical History:  Procedure Laterality Date   DENTAL SURGERY     MASS EXCISION Right 04/20/2022   Procedure: NEEDLE BIOPSY OF CHEST WALL MASS RIGHT  LOWER CHEST WALL;  Surgeon: [name], MD;  Location: [facility];  Service: Thoracic;  Laterality: Right;   NOSE SURGERY     SKIN CANCER EXCISION     left forehead   TONSILLECTOMY     VIDEO BRONCHOSCOPY WITH ENDOBRONCHIAL NAVIGATION N/A 04/20/2022   Procedure: VIDEO BRONCHOSCOPY WITH ENDOBRONCHIAL NAVIGATION;  Surgeon: [name], MD;  Location: [facility];  Service: Thoracic;  Laterality: N/A;    REVIEW OF SYSTEMS:  Constitutional: positive for fatigue and weight loss Eyes: negative Ears, nose, mouth, throat, and face: negative Respiratory: positive for dyspnea on exertion Cardiovascular: negative Gastrointestinal: negative Genitourinary:negative Integument/breast: negative Hematologic/lymphatic: negative Musculoskeletal:positive for muscle weakness Neurological: negative Behavioral/Psych: negative Endocrine: negative Allergic/Immunologic: negative   PHYSICAL EXAMINATION: General appearance: alert, cooperative, fatigued, and no distress Head: Normocephalic, without obvious abnormality, atraumatic Neck: no adenopathy, no JVD, supple, symmetrical, trachea midline, and thyroid not enlarged, symmetric, no tenderness/mass/nodules Lymph nodes: Cervical, supraclavicular, and axillary nodes normal. Resp: clear to auscultation bilaterally Back: symmetric, no curvature. ROM normal. No CVA tenderness. Cardio: regular rate and rhythm, S1, S2 normal, no murmur, click, rub or gallop GI: soft, non-tender; bowel sounds normal; no masses,  no organomegaly Extremities: extremities normal, atraumatic, no cyanosis or edema Neurologic: Alert and oriented X 3, normal strength and tone. Normal symmetric reflexes. Normal coordination and gait  ECOG PERFORMANCE STATUS: 1 - Symptomatic but completely ambulatory  Blood pressure 111/78, pulse 77, temperature (!) 97.4 F (36.3 C), temperature source Oral, resp. rate 16, weight 135 lb 9 oz (61.5 kg), SpO2 95 %.  LABORATORY DATA: Lab Results   Component Value Date   WBC 12.3 (H) 07/27/2022   HGB 12.4 (L) 07/27/2022   HCT 39.0 07/27/2022   MCV 90.7 07/27/2022   PLT 318 07/27/2022      Chemistry      Component Value Date/Time   NA 134 (L) 07/27/2022 0925   K 3.5 07/27/2022 0925   CL 99 07/27/2022 0925   CO2 25 07/27/2022 0925   BUN 12 07/27/2022 0925   CREATININE 0.71 07/27/2022 0925   CREATININE 0.83 07/13/2022 1330      Component Value Date/Time   CALCIUM 10.4 (H) 07/27/2022 0925   ALKPHOS 118 07/27/2022 0925   AST 15 07/27/2022 0925   AST 19 07/13/2022 1330   ALT 11 07/27/2022 0925   ALT 15 07/13/2022 1330   BILITOT 0.5 07/27/2022 0925   BILITOT 0.5 07/13/2022 1330       RADIOGRAPHIC STUDIES: No results found.  ASSESSMENT AND PLAN: This is a very pleasant 67 years old white male with Stage IV (T3, N3, M1 C) non-small cell lung cancer, squamous cell carcinoma presented with bilateral pulmonary masses and nodules as well as mediastinal and bilateral hilar adenopathy in addition to subcutaneous metastatic disease and bone metastasis diagnosed in October 2023 with PD-L1 expression of 1% and no actionable mutations.  The patient is status post  palliative radiotherapy to the painful abdominal lesion under the care of Dr. Sondra Come. He is currently on systemic chemotherapy with carboplatin for AUC of 5, paclitaxel 175 Mg/M2 and Keytruda 200 Mg IV every 3 weeks with Neulasta support status post 1 cycle which was given on May 09, 2022. The patient did not show up for his treatment after that cycle and he has been complaining of increasing fatigue and weakness and not ready to resume treatment. He is here today for evaluation and recommendation regarding his condition. He was also diagnosed with acute pulmonary embolism and currently on treatment with Eliquis. I had a lengthy discussion with the patient and his family member today about his current condition and treatment options. I discussed with the patient palliative  care and hospice referral but he still like to consider treatment in the future if he started feeling better.  He also got for a few more weeks to work on his strength before resuming the treatment but still very interested in receiving treatment in the future. I will arrange for the patient to have repeat CT scan of the chest, abdomen and pelvis in 1 months and he will come back for follow-up visit at that time. If the patient is feeling better and would like to resume his treatment, I will consider it at that time.  Otherwise he may need to consider palliative care and hospice. The patient was advised to call immediately if he has any other concerning symptoms in the interval. The patient voices understanding of current disease status and treatment options and is in agreement with the current care plan.  All questions were answered. The patient knows to call the clinic with any problems, questions or concerns. We can certainly see the patient much sooner if necessary.  The total time spent in the appointment was 35 minutes.  Disclaimer: This note was dictated with voice recognition software. Similar sounding words can inadvertently be transcribed and may not be corrected upon review.

## 2022-07-27 NOTE — Progress Notes (Incomplete)
  Radiation Oncology         [phone] ________________________________  Patient Name: Roger [name] MRN: [id number] DOB: [birth year] Referring Physician: Curt Bears (Profile Not Attached) Date of Service: 06/30/2022 [facility], [place]                                                        End Of Treatment Note  Diagnoses: C34.32-Malignant neoplasm of lower lobe, left bronchus or lung  Cancer Staging: The encounter diagnosis was Primary squamous cell carcinoma of left lung ([place]).   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: presented with bilateral pulmonary masses and nodules as well as mediastinal and bilateral hilar adenopathy in addition to subcutaneous metastatic disease and bone metastasis diagnosed in October 2023   Cancer Staging  Primary squamous cell carcinoma of left lung (HCC) Staging form: Lung, AJCC 8th Edition - Clinical: Stage IVB (cT3, cN3, cM1c) - Signed by Curt Bears, MD on 05/03/2022  Intent: Palliative  Radiation Treatment Dates: 06/08/2022 through 06/30/2022 Site Technique Total Dose (Gy) Dose per Fx (Gy) Completed Fx Beam Energies  Abdomen: Abd 3D 35/35 2.5 14/14 6X, 10X, 15X   Narrative: The patient tolerated radiation therapy relatively well. During his final weekly treatment check on 06/29/22, the patient reported pain (rated at a 4/10), and fatigue. He denied any other side effects from treatment. Physical exam performed that day showed some erythema to the treatment site and further shrinkage of the tumor nodule. No skin breakdown was appreciated, or drainage.   Plan: The patient will follow-up with radiation oncology in one month. ________________________________________________ -----------------------------------  Blair Promise, PhD, MD  This document serves as a record of services personally performed by Gery Pray, MD. It was created on his behalf by Roney Mans, a trained medical  scribe. The creation of this record is based on the scribe's personal observations and the provider's statements to them. This document has been checked and approved by the attending provider.

## 2022-07-28 ENCOUNTER — Other Ambulatory Visit: Payer: Self-pay

## 2022-07-28 ENCOUNTER — Ambulatory Visit
Admission: RE | Admit: 2022-07-28 | Discharge: 2022-07-28 | Disposition: A | Discharge status: Home / Self Care | Payer: BC Managed Care – PPO | Source: Ambulatory Visit | Attending: Radiation Oncology | Admitting: Radiation Oncology

## 2022-07-28 HISTORY — DX: Personal history of irradiation: Z92.3

## 2022-07-29 ENCOUNTER — Inpatient Hospital Stay: Payer: BC Managed Care – PPO

## 2022-07-29 ENCOUNTER — Telehealth: Payer: Self-pay | Admitting: *Deleted

## 2022-07-29 NOTE — Telephone Encounter (Signed)
CALLED PATIENT TO RESCHEDULE MISSED FU ON 07/28/22, SPOKE WITH PATIENT AND HE AGREED TO COME ON 08-18-22 @ 4:15 PM

## 2022-08-02 ENCOUNTER — Other Ambulatory Visit: Payer: BC Managed Care – PPO

## 2022-08-03 ENCOUNTER — Inpatient Hospital Stay: Payer: BC Managed Care – PPO

## 2022-08-03 ENCOUNTER — Telehealth: Payer: Self-pay | Admitting: Internal Medicine

## 2022-08-03 NOTE — Telephone Encounter (Signed)
Patient called to r/s lab appointments. Patient r/s and notified.

## 2022-08-04 ENCOUNTER — Other Ambulatory Visit: Payer: Self-pay

## 2022-08-04 ENCOUNTER — Inpatient Hospital Stay: Payer: BC Managed Care – PPO | Attending: Internal Medicine

## 2022-08-04 DIAGNOSIS — C3492 Malignant neoplasm of unspecified part of left bronchus or lung: Secondary | ICD-10-CM

## 2022-08-04 DIAGNOSIS — C3481 Malignant neoplasm of overlapping sites of right bronchus and lung: Secondary | ICD-10-CM | POA: Insufficient documentation

## 2022-08-04 DIAGNOSIS — C7951 Secondary malignant neoplasm of bone: Secondary | ICD-10-CM | POA: Diagnosis not present

## 2022-08-04 LAB — COMPREHENSIVE METABOLIC PANEL
ALT: 10 U/L (ref 0–44)
AST: 15 U/L (ref 15–41)
Albumin: 3.3 g/dL — ABNORMAL LOW (ref 3.5–5.0)
Alkaline Phosphatase: 119 U/L (ref 38–126)
Anion gap: 7 (ref 5–15)
BUN: 10 mg/dL (ref 8–23)
CO2: 26 mmol/L (ref 22–32)
Calcium: 10.2 mg/dL (ref 8.9–10.3)
Chloride: 101 mmol/L (ref 98–111)
Creatinine, Ser: 0.54 mg/dL — ABNORMAL LOW (ref 0.61–1.24)
GFR, Estimated: >60 mL/min (ref >60)
Glucose, Bld: 139 mg/dL — ABNORMAL HIGH (ref 70–99)
Potassium: 3.4 mmol/L — ABNORMAL LOW (ref 3.5–5.1)
Sodium: 134 mmol/L — ABNORMAL LOW (ref 135–145)
Total Bilirubin: 0.5 mg/dL (ref 0.3–1.2)
Total Protein: 8.1 g/dL (ref 6.5–8.1)

## 2022-08-04 LAB — CBC WITH DIFFERENTIAL/PLATELET
Abs Immature Granulocytes: 0.03 10*3/uL (ref 0.00–0.07)
Basophils Absolute: 0.1 10*3/uL (ref 0.0–0.1)
Basophils Relative: 1 %
Eosinophils Absolute: 0.2 10*3/uL (ref 0.0–0.5)
Eosinophils Relative: 2 %
HCT: 36.7 % — ABNORMAL LOW (ref 39.0–52.0)
Hemoglobin: 11.8 g/dL — ABNORMAL LOW (ref 13.0–17.0)
Immature Granulocytes: 0 %
Lymphocytes Relative: 8 %
Lymphs Abs: 0.9 10*3/uL (ref 0.7–4.0)
MCH: 28.8 pg (ref 26.0–34.0)
MCHC: 32.2 g/dL (ref 30.0–36.0)
MCV: 89.5 fL (ref 80.0–100.0)
Monocytes Absolute: 0.9 10*3/uL (ref 0.1–1.0)
Monocytes Relative: 8 %
Neutro Abs: 8.9 10*3/uL — ABNORMAL HIGH (ref 1.7–7.7)
Neutrophils Relative %: 81 %
Platelets: 284 10*3/uL (ref 150–400)
RBC: 4.1 MIL/uL — ABNORMAL LOW (ref 4.22–5.81)
RDW: 15.3 % (ref 11.5–15.5)
WBC: 10.9 10*3/uL — ABNORMAL HIGH (ref 4.0–10.5)
nRBC: 0 % (ref 0.0–0.2)

## 2022-08-09 ENCOUNTER — Ambulatory Visit: Payer: BC Managed Care – PPO

## 2022-08-09 ENCOUNTER — Ambulatory Visit: Payer: BC Managed Care – PPO | Admitting: Internal Medicine

## 2022-08-09 ENCOUNTER — Other Ambulatory Visit: Payer: BC Managed Care – PPO

## 2022-08-09 ENCOUNTER — Telehealth: Payer: Self-pay | Admitting: Internal Medicine

## 2022-08-09 NOTE — Telephone Encounter (Signed)
Called patient regarding upcoming February appointments, left a voicemail.

## 2022-08-10 ENCOUNTER — Inpatient Hospital Stay: Payer: BC Managed Care – PPO

## 2022-08-11 ENCOUNTER — Ambulatory Visit: Payer: BC Managed Care – PPO

## 2022-08-11 ENCOUNTER — Inpatient Hospital Stay: Payer: BC Managed Care – PPO

## 2022-08-16 ENCOUNTER — Other Ambulatory Visit: Payer: BC Managed Care – PPO

## 2022-08-17 ENCOUNTER — Other Ambulatory Visit: Payer: BC Managed Care – PPO

## 2022-08-17 ENCOUNTER — Ambulatory Visit: Payer: BC Managed Care – PPO

## 2022-08-17 ENCOUNTER — Ambulatory Visit: Payer: BC Managed Care – PPO | Admitting: Internal Medicine

## 2022-08-17 NOTE — Progress Notes (Incomplete)
Radiation Oncology         [phone] ________________________________  Name: Roger [name] MRN: [id number]  Date: 08/18/2022  DOB: [birth year]  Follow-Up Visit Note  CC: Aletha Halim., PA-C  Curt Bears, MD  No diagnosis found.  Diagnosis: The encounter diagnosis was Primary squamous cell carcinoma of left lung ([place]).   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: presented with bilateral pulmonary masses and nodules as well as mediastinal and bilateral hilar adenopathy in addition to subcutaneous metastatic disease and bone metastasis diagnosed in October 2023   Cancer Staging  Primary squamous cell carcinoma of left lung (HCC) Staging form: Lung, AJCC 8th Edition - Clinical: Stage IVB (cT3, cN3, cM1c) - Signed by Curt Bears, MD on 05/03/2022  Interval Since Last Radiation: 1 month and 18 days    Intent: Palliative  Radiation Treatment Dates: 06/08/2022 through 06/30/2022 Site Technique Total Dose (Gy) Dose per Fx (Gy) Completed Fx Beam Energies  Abdomen: Abd 3D 35/35 2.5 14/14 6X, 10X, 15X   Narrative:  The patient returns today for routine follow-up. The patient tolerated radiation therapy relatively well. During his final weekly treatment check on 06/29/22, the patient reported pain (rated at a 4/10), and fatigue. He denied any other side effects from treatment. Physical exam performed that day showed some erythema to the treatment site and further shrinkage of the tumor nodule. No skin breakdown was appreciated, or drainage.   In the interval since his initial consultation date, the patient received his first cycle of palliative chemotherapy consisting of carboplatin, paclitaxel and Keytruda (with Neulasta) on 05/09/22 (Dr. Julien Nordmann).   Prior to initiating radiation therapy, the patient was hospitalized from 05/31/22 through 06/03/22 for management of acute PE. The morning of his admission, the patient presented to the cancer center for  his second chemotherapy infusion. Vitals performed pre-treatment showed O2 sats in the low 80's. Subsequently, his infusion was canceled and was sent to the ED. CTA of the chest performed while inpatient showed findings consistent with moderate PE and evidence of right heart strain. Hospital course included supplemental O2, and heparin which was later transitioned to eliquis (which the patient remains on).    The patient has not yet resumed chemotherapy due to feeling poorly for the last several months since his hospitalization. During his most recent follow-up visit with Dr. Julien Nordmann on 07/27/22, the patient expressed that he is still very interested in completing the remainder of his systemic treatment once he has regained his strength                              Pertinent imaging performed in the interval since his initial consultation date includes: an MRI of the brain with and without contrast on 05/19/22 which showed no evidence of intracranial metastatic disease.   ***  Allergies:  is allergic to demerol, erythromycin, prednisone, sulfa antibiotics, and augmentin [amoxicillin-pot clavulanate].  Meds: Current Outpatient Medications  Medication Sig Dispense Refill   albuterol (PROVENTIL HFA;VENTOLIN HFA) 108 (90 Base) MCG/ACT inhaler Inhale 2 puffs into the lungs every 6 (six) hours as needed for wheezing or shortness of breath.     ALPRAZolam (XANAX) 1 MG tablet Take 1 mg by mouth 5 (five) times daily. 8-12-4-8- float last pill     apixaban (ELIQUIS) 5 MG TABS tablet Take 2 tablets (10mg ) twice daily for 7 days, then 1 tablet (5mg ) twice daily (Patient taking differently:  Take 10 mg by mouth 2 (two) times daily.) 60 tablet 0   apixaban (ELIQUIS) 5 MG TABS tablet Take 1 tablet (5 mg total) by mouth 2 (two) times daily. 60 tablet 2   atenolol (TENORMIN) 25 MG tablet Take 0.5 tablets (12.5 mg total) by mouth daily. 15 tablet 0   atorvastatin (LIPITOR) 10 MG tablet Take 10 mg by mouth every  evening.     fluticasone (FLONASE) 50 MCG/ACT nasal spray Place 2 sprays into both nostrils daily as needed for allergies.     lidocaine-prilocaine (EMLA) cream Apply to the Port-A-Cath site 30-60 minutes before treatment. 30 g 0   metFORMIN (GLUCOPHAGE-XR) 500 MG 24 hr tablet Take 500 mg by mouth every evening.     mirtazapine (REMERON) 15 MG tablet Take 1 tablet (15 mg total) by mouth at bedtime. 30 tablet 2   prochlorperazine (COMPAZINE) 10 MG tablet Take 1 tablet (10 mg total) by mouth every 6 (six) hours as needed for nausea or vomiting. 30 tablet 0   No current facility-administered medications for this encounter.    Physical Findings: The patient is in no acute distress. Patient is alert and oriented.  vitals were not taken for this visit. .  No significant changes. Lungs are clear to auscultation bilaterally. Heart has regular rate and rhythm. No palpable cervical, supraclavicular, or axillary adenopathy. Abdomen soft, non-tender, normal bowel sounds.   Lab Findings: Lab Results  Component Value Date   WBC 10.9 (H) 08/04/2022   HGB 11.8 (L) 08/04/2022   HCT 36.7 (L) 08/04/2022   MCV 89.5 08/04/2022   PLT 284 08/04/2022    Radiographic Findings: No results found.  Impression: The encounter diagnosis was Primary squamous cell carcinoma of left lung ([place]).   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  ***  Plan:  ***   *** minutes of total time was spent for this patient encounter, including preparation, face-to-face counseling with the patient and coordination of care, physical exam, and documentation of the encounter. ____________________________________  Blair Promise, PhD, MD  This  document serves as a record of services personally performed by Gery Pray, MD. It was created on his behalf by Roney Mans, a trained medical scribe. The creation of this record is based on the scribe's personal observations and the provider's statements to them. This document has been checked and approved by the attending provider.

## 2022-08-18 ENCOUNTER — Ambulatory Visit
Admission: RE | Admit: 2022-08-18 | Discharge: 2022-08-18 | Disposition: A | Discharge status: Home / Self Care | Payer: BC Managed Care – PPO | Source: Ambulatory Visit | Attending: Radiation Oncology | Admitting: Radiation Oncology

## 2022-08-18 ENCOUNTER — Telehealth: Payer: Self-pay

## 2022-08-18 NOTE — Telephone Encounter (Signed)
Patients sister Tama Gander called in to report patient is very weak and unable to attend follow up appointment with Dr. Sondra Come today. I asked if she would like to reschedule appointment, per Inez Catalina will call back at a later time to reschedule. Inez Catalina also requested for Dr. Earlie Server nurse to call her regarding patients condition, reached out to Dr, Rosezetta Schlatter. Nurse to follow up with [facility].

## 2022-08-18 NOTE — Telephone Encounter (Signed)
This nurse reached out to patient due to missing appointment with Dr. Sondra Come.  Patient states he is just not feeling well.  He does not have any energy to do much at all.  Patients sister's state that patient will not eat and he is not bathing.  They also stated that patient is sleeping in his clothes and shoes and he will not wear his oxygen like he is supposed to.  This nurse asked patient if he has plans to continue coming to his appointments, patient states that he would like to feel better and then continue his treatment.  This nurse explained to patient about Palliative Care and asked if he would like to be referred.  Patient stated that if he is not able to make it to his CT scan on Tuesday 2/20 then he is willing to do Palliative Care.  This nurse agreed to follow up on Tuesday.

## 2022-08-19 ENCOUNTER — Ambulatory Visit: Payer: BC Managed Care – PPO

## 2022-08-23 ENCOUNTER — Other Ambulatory Visit: Payer: BC Managed Care – PPO

## 2022-08-23 ENCOUNTER — Telehealth: Payer: Self-pay | Admitting: Medical Oncology

## 2022-08-23 ENCOUNTER — Encounter: Payer: Self-pay | Admitting: Internal Medicine

## 2022-08-23 ENCOUNTER — Inpatient Hospital Stay: Payer: BC Managed Care – PPO

## 2022-08-23 ENCOUNTER — Ambulatory Visit (HOSPITAL_COMMUNITY): Payer: BC Managed Care – PPO

## 2022-08-23 ENCOUNTER — Telehealth: Payer: Self-pay

## 2022-08-23 ENCOUNTER — Encounter (HOSPITAL_COMMUNITY): Payer: Self-pay

## 2022-08-23 NOTE — Telephone Encounter (Signed)
Palliative care referral.-Pt could not make appt today for CT scan and is too weak to get to appts. His sister said he has not bathed in weeks ,not changing his clothes and not taking off his shoes. He is eating cereal  Per Dr Julien Nordmann I talked to [facility] and he declined Hospice and is agreeable to Palliative Care.  Referral made to [facility] palliative care..  They will call Inez Catalina to schedule a visit.

## 2022-08-23 NOTE — Telephone Encounter (Signed)
(  3:07 pm) PC SW scheduled an initial visit for patient with his sister-Betty. The visit is scheduled for 08/25/22@ 11:30 am.

## 2022-08-24 ENCOUNTER — Other Ambulatory Visit: Payer: BC Managed Care – PPO

## 2022-08-24 MED FILL — Fosaprepitant Dimeglumine For IV Infusion 150 MG (Base Eq): INTRAVENOUS | Qty: 5 | Status: AC

## 2022-08-24 MED FILL — Dexamethasone Sodium Phosphate Inj 100 MG/10ML: INTRAMUSCULAR | Qty: 1 | Status: AC

## 2022-08-25 ENCOUNTER — Other Ambulatory Visit: Payer: BC Managed Care – PPO

## 2022-08-25 ENCOUNTER — Inpatient Hospital Stay: Payer: BC Managed Care – PPO | Admitting: Internal Medicine

## 2022-08-25 ENCOUNTER — Inpatient Hospital Stay: Payer: BC Managed Care – PPO

## 2022-08-25 ENCOUNTER — Inpatient Hospital Stay: Payer: BC Managed Care – PPO | Admitting: Nutrition

## 2022-08-25 VITALS — BP 110/72 | HR 82 | Resp 16

## 2022-08-25 DIAGNOSIS — Z515 Encounter for palliative care: Secondary | ICD-10-CM

## 2022-08-26 NOTE — Progress Notes (Signed)
COMMUNITY PALLIATIVE CARE SW NOTE  PATIENT NAME: Roger [name] DOB: 03-23-1956 MRN: [id number]  PRIMARY CARE PROVIDER: Aletha Halim., PA-C  RESPONSIBLE PARTY:  Acct ID - Guarantor Home Phone Work Phone Relationship Acct Type  [id number] [name], [name] [id number]  Self P/F     [address], [place], [place], [place] [address]   Initial Palliative Care Social Work Visit  Initial visit with patient at his home. His sister/PCG-Betty and sister-in-law/POA-Mary was present for this visit with patient.  Patient cancer started in his stomach. He took fourteen treatments of radiation and one round of treatment of chemo and decided to stop it because of the side effects. Patient has not been to cancer center in 3 weeks.  Patient ambulates independently. His appetite is poor since treatments. Patient report his food taste funny when he eats. Patient has loss 30 lbs. since October.  Patient's goals were clarified. He reports that wants to continue with cancer treatment after "he has built himself up". Patient admitted that he was not prepared for the side effects from the treatment and he had to stop it. Patient in on 2L of o2 continuous.  Patient ambulates independently. He had one fall so far. Patient denies pain. Patient reports that he sleeps well at night, but does nap throughout the day.   Patient Goals: Patient would like to receive pulmonary therapy. He would like to continue with treatments. Patient desires to be a Full Code.  Patient has DPOA-[name]     SOCIAL HX: Patient was born and raised in [place]. Patient graduated from [employer]. 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 Patient is divorced Patient has one daughter that he is estranged for. Social History   Tobacco Use   Smoking status: Former    Packs/day: 0.50    Years: 25.00    Total pack years: 12.50    Types: Cigarettes   Smokeless tobacco: Never  Substance Use  Topics   Alcohol use: Never    CODE STATUS: Full Code ADVANCED DIRECTIVES: No MOST FORM COMPLETE:  No, education provided HOSPICE EDUCATION PROVIDED: Yes, education provided.  Duration of visit and documentation: 75 minutes  Next appointment: September 22, 2022 @ 11:30 am  [place]

## 2022-08-27 ENCOUNTER — Inpatient Hospital Stay: Payer: BC Managed Care – PPO

## 2022-08-30 NOTE — Progress Notes (Signed)
[facility] Initial Encounter Note   PATIENT NAME: Roger [name] DOB: [birth year] MRN: [id number]  PRIMARY CARE PROVIDER: Aletha Halim., PA-C  RESPONSIBLE PARTY:  Acct ID - Guarantor Home Phone Work Phone Relationship Acct Type  [id number] Roger [name] [phone]  Self [name]/F     [address], [place], [place], [place] [address]   RN/SW completed home visit. Sister  and sister in law also present    HISTORY OF PRESENT ILLNESS:    67 y.o. male with medical history significant of SCC of left lung with mets to bone, HTN, COPD, anxiety, DVT, HLD.    Socially: lives in first floor apt with sister and sister in law.    Cognitive: alert and oriented. Participates in conversation.    Appetite: decreased appetite. Food tastes off. Reports losing 30-40 pounds since new diagnosis in Oct of 2023.    Mobility: ambulating in apartment without difficulty. No uses of walker or cane. Fell over tennis shoe about a week ago, no injury.   Sleeping Pattern: Does sleep a lot. No issues at night. Reports that he will nap during the day when whatever is on tv is boring".  Pain: no pain. Reports that he will take advil or Tylenol if he has aches and pains.  Respiratory: Pt sob at times if talking a lot. However he is able to talk in complete sentences. Uses oxygen when needed for shortness of breath. Uses oxygen at night at 2lpm via n/c.   Cancer treatment: underwent 14 radiation treatments and only 1 chemo tx.Pt reports that chemo really wiped him out. He skipped following 3 chemo treatments.   Palliative Care/ Hospice: RN explained role and purpose of palliative care including visit frequency. Also discussed benefits of hospice care as well as the differences between the two with patient.   Goals of Care: get the cancer gone    CODE STATUS: full code ADVANCED DIRECTIVES: N MOST FORM: No PPS:   Next appointment: September 22, 2022 @ 11:30 am   PHYSICAL EXAM:   VITALS: Today's  Vitals   08/25/22 1147  BP: 110/72  Pulse: 82  Resp: 16  SpO2: 96%  PainSc: 0-No pain    LUNGS: diminished in bases bilat. CARDIAC:  RRR, no JVD EXTREMITIES: MAE x 4, no edema SKIN: Healed area noted to rt side of abdomen from "lipoma removal"    Jacqulyn Cane, RN

## 2022-08-31 ENCOUNTER — Ambulatory Visit (HOSPITAL_COMMUNITY): Payer: Medicare Other

## 2022-08-31 ENCOUNTER — Inpatient Hospital Stay: Payer: BC Managed Care – PPO

## 2022-08-31 ENCOUNTER — Other Ambulatory Visit: Payer: Self-pay

## 2022-08-31 ENCOUNTER — Emergency Department (HOSPITAL_COMMUNITY): Payer: BC Managed Care – PPO

## 2022-08-31 ENCOUNTER — Encounter: Payer: Self-pay | Admitting: Internal Medicine

## 2022-08-31 ENCOUNTER — Inpatient Hospital Stay (HOSPITAL_COMMUNITY)
Admission: EM | Admit: 2022-08-31 | Discharge: 2022-09-11 | DRG: 871 | Disposition: A | Discharge status: Hospice | Payer: BC Managed Care – PPO | Attending: Internal Medicine | Admitting: Internal Medicine

## 2022-08-31 DIAGNOSIS — R627 Adult failure to thrive: Secondary | ICD-10-CM | POA: Diagnosis not present

## 2022-08-31 DIAGNOSIS — Z880 Allergy status to penicillin: Secondary | ICD-10-CM

## 2022-08-31 DIAGNOSIS — I5021 Acute systolic (congestive) heart failure: Secondary | ICD-10-CM | POA: Diagnosis present

## 2022-08-31 DIAGNOSIS — C3492 Malignant neoplasm of unspecified part of left bronchus or lung: Secondary | ICD-10-CM | POA: Diagnosis present

## 2022-08-31 DIAGNOSIS — Z515 Encounter for palliative care: Secondary | ICD-10-CM

## 2022-08-31 DIAGNOSIS — Z7189 Other specified counseling: Secondary | ICD-10-CM

## 2022-08-31 DIAGNOSIS — E872 Acidosis, unspecified: Secondary | ICD-10-CM | POA: Diagnosis present

## 2022-08-31 DIAGNOSIS — A419 Sepsis, unspecified organism: Secondary | ICD-10-CM | POA: Diagnosis present

## 2022-08-31 DIAGNOSIS — J189 Pneumonia, unspecified organism: Secondary | ICD-10-CM | POA: Diagnosis not present

## 2022-08-31 DIAGNOSIS — J90 Pleural effusion, not elsewhere classified: Secondary | ICD-10-CM | POA: Diagnosis not present

## 2022-08-31 DIAGNOSIS — E871 Hypo-osmolality and hyponatremia: Secondary | ICD-10-CM | POA: Diagnosis present

## 2022-08-31 DIAGNOSIS — K5900 Constipation, unspecified: Secondary | ICD-10-CM | POA: Diagnosis present

## 2022-08-31 DIAGNOSIS — Z1152 Encounter for screening for COVID-19: Secondary | ICD-10-CM | POA: Diagnosis not present

## 2022-08-31 DIAGNOSIS — E119 Type 2 diabetes mellitus without complications: Secondary | ICD-10-CM | POA: Diagnosis present

## 2022-08-31 DIAGNOSIS — Z7984 Long term (current) use of oral hypoglycemic drugs: Secondary | ICD-10-CM

## 2022-08-31 DIAGNOSIS — Z91199 Patient's noncompliance with other medical treatment and regimen due to unspecified reason: Secondary | ICD-10-CM

## 2022-08-31 DIAGNOSIS — E876 Hypokalemia: Secondary | ICD-10-CM | POA: Diagnosis present

## 2022-08-31 DIAGNOSIS — Z9981 Dependence on supplemental oxygen: Secondary | ICD-10-CM

## 2022-08-31 DIAGNOSIS — I4891 Unspecified atrial fibrillation: Secondary | ICD-10-CM

## 2022-08-31 DIAGNOSIS — I4819 Other persistent atrial fibrillation: Secondary | ICD-10-CM | POA: Diagnosis not present

## 2022-08-31 DIAGNOSIS — L8990 Pressure ulcer of unspecified site, unspecified stage: Secondary | ICD-10-CM | POA: Insufficient documentation

## 2022-08-31 DIAGNOSIS — J910 Malignant pleural effusion: Secondary | ICD-10-CM | POA: Diagnosis not present

## 2022-08-31 DIAGNOSIS — Z66 Do not resuscitate: Secondary | ICD-10-CM | POA: Diagnosis present

## 2022-08-31 DIAGNOSIS — I959 Hypotension, unspecified: Secondary | ICD-10-CM | POA: Diagnosis present

## 2022-08-31 DIAGNOSIS — Z681 Body mass index (BMI) 19 or less, adult: Secondary | ICD-10-CM | POA: Diagnosis not present

## 2022-08-31 DIAGNOSIS — Z888 Allergy status to other drugs, medicaments and biological substances status: Secondary | ICD-10-CM

## 2022-08-31 DIAGNOSIS — C3480 Malignant neoplasm of overlapping sites of unspecified bronchus and lung: Secondary | ICD-10-CM | POA: Diagnosis not present

## 2022-08-31 DIAGNOSIS — J9601 Acute respiratory failure with hypoxia: Secondary | ICD-10-CM | POA: Diagnosis present

## 2022-08-31 DIAGNOSIS — Z86711 Personal history of pulmonary embolism: Secondary | ICD-10-CM

## 2022-08-31 DIAGNOSIS — L89152 Pressure ulcer of sacral region, stage 2: Secondary | ICD-10-CM | POA: Diagnosis present

## 2022-08-31 DIAGNOSIS — C7951 Secondary malignant neoplasm of bone: Secondary | ICD-10-CM | POA: Diagnosis present

## 2022-08-31 DIAGNOSIS — Z85828 Personal history of other malignant neoplasm of skin: Secondary | ICD-10-CM

## 2022-08-31 DIAGNOSIS — D6869 Other thrombophilia: Secondary | ICD-10-CM | POA: Diagnosis not present

## 2022-08-31 DIAGNOSIS — Z881 Allergy status to other antibiotic agents status: Secondary | ICD-10-CM

## 2022-08-31 DIAGNOSIS — I110 Hypertensive heart disease with heart failure: Secondary | ICD-10-CM | POA: Diagnosis present

## 2022-08-31 DIAGNOSIS — Z7901 Long term (current) use of anticoagulants: Secondary | ICD-10-CM

## 2022-08-31 DIAGNOSIS — F32A Depression, unspecified: Secondary | ICD-10-CM | POA: Diagnosis present

## 2022-08-31 DIAGNOSIS — E785 Hyperlipidemia, unspecified: Secondary | ICD-10-CM | POA: Diagnosis present

## 2022-08-31 DIAGNOSIS — Z818 Family history of other mental and behavioral disorders: Secondary | ICD-10-CM

## 2022-08-31 DIAGNOSIS — Z801 Family history of malignant neoplasm of trachea, bronchus and lung: Secondary | ICD-10-CM

## 2022-08-31 DIAGNOSIS — Z79899 Other long term (current) drug therapy: Secondary | ICD-10-CM

## 2022-08-31 DIAGNOSIS — E43 Unspecified severe protein-calorie malnutrition: Secondary | ICD-10-CM | POA: Diagnosis present

## 2022-08-31 DIAGNOSIS — C799 Secondary malignant neoplasm of unspecified site: Secondary | ICD-10-CM

## 2022-08-31 DIAGNOSIS — Z882 Allergy status to sulfonamides status: Secondary | ICD-10-CM

## 2022-08-31 DIAGNOSIS — Z8673 Personal history of transient ischemic attack (TIA), and cerebral infarction without residual deficits: Secondary | ICD-10-CM

## 2022-08-31 DIAGNOSIS — F419 Anxiety disorder, unspecified: Secondary | ICD-10-CM | POA: Diagnosis present

## 2022-08-31 LAB — URINALYSIS, W/ REFLEX TO CULTURE (INFECTION SUSPECTED)
Bacteria, UA: NONE SEEN
Bilirubin Urine: NEGATIVE
Glucose, UA: NEGATIVE mg/dL
Hgb urine dipstick: NEGATIVE
Ketones, ur: NEGATIVE mg/dL
Leukocytes,Ua: NEGATIVE
Nitrite: NEGATIVE
Protein, ur: NEGATIVE mg/dL
Specific Gravity, Urine: 1.019 (ref 1.005–1.030)
pH: 5 (ref 5.0–8.0)

## 2022-08-31 LAB — COMPREHENSIVE METABOLIC PANEL
ALT: 14 U/L (ref 0–44)
AST: 22 U/L (ref 15–41)
Albumin: 2.8 g/dL — ABNORMAL LOW (ref 3.5–5.0)
Alkaline Phosphatase: 129 U/L — ABNORMAL HIGH (ref 38–126)
Anion gap: 10 (ref 5–15)
BUN: 17 mg/dL (ref 8–23)
CO2: 24 mmol/L (ref 22–32)
Calcium: 10.9 mg/dL — ABNORMAL HIGH (ref 8.9–10.3)
Chloride: 101 mmol/L (ref 98–111)
Creatinine, Ser: 0.93 mg/dL (ref 0.61–1.24)
GFR, Estimated: >60 mL/min (ref >60)
Glucose, Bld: 185 mg/dL — ABNORMAL HIGH (ref 70–99)
Potassium: 3.8 mmol/L (ref 3.5–5.1)
Sodium: 135 mmol/L (ref 135–145)
Total Bilirubin: 1 mg/dL (ref 0.3–1.2)
Total Protein: 7.6 g/dL (ref 6.5–8.1)

## 2022-08-31 LAB — CBC WITH DIFFERENTIAL/PLATELET
Abs Immature Granulocytes: 0.06 10*3/uL (ref 0.00–0.07)
Basophils Absolute: 0 10*3/uL (ref 0.0–0.1)
Basophils Relative: 0 %
Eosinophils Absolute: 0 10*3/uL (ref 0.0–0.5)
Eosinophils Relative: 0 %
HCT: 35.9 % — ABNORMAL LOW (ref 39.0–52.0)
Hemoglobin: 11.1 g/dL — ABNORMAL LOW (ref 13.0–17.0)
Immature Granulocytes: 1 %
Lymphocytes Relative: 4 %
Lymphs Abs: 0.5 10*3/uL — ABNORMAL LOW (ref 0.7–4.0)
MCH: 28.2 pg (ref 26.0–34.0)
MCHC: 30.9 g/dL (ref 30.0–36.0)
MCV: 91.3 fL (ref 80.0–100.0)
Monocytes Absolute: 0.6 10*3/uL (ref 0.1–1.0)
Monocytes Relative: 4 %
Neutro Abs: 11.7 10*3/uL — ABNORMAL HIGH (ref 1.7–7.7)
Neutrophils Relative %: 91 %
Platelets: 346 10*3/uL (ref 150–400)
RBC: 3.93 MIL/uL — ABNORMAL LOW (ref 4.22–5.81)
RDW: 15.6 % — ABNORMAL HIGH (ref 11.5–15.5)
WBC: 13 10*3/uL — ABNORMAL HIGH (ref 4.0–10.5)
nRBC: 0 % (ref 0.0–0.2)

## 2022-08-31 LAB — BLOOD GAS, VENOUS
Acid-Base Excess: 1.2 mmol/L (ref 0.0–2.0)
Bicarbonate: 26.6 mmol/L (ref 20.0–28.0)
O2 Saturation: 19.9 %
Patient temperature: 37
pCO2, Ven: 44 mmHg (ref 44–60)
pH, Ven: 7.39 (ref 7.25–7.43)
pO2, Ven: <31 mmHg — CL (ref 32–45)

## 2022-08-31 LAB — LACTIC ACID, PLASMA
Lactic Acid, Venous: 4.7 mmol/L (ref 0.5–1.9)
Lactic Acid, Venous: 2.7 mmol/L (ref 0.5–1.9)
Lactic Acid, Venous: 1.7 mmol/L (ref 0.5–1.9)

## 2022-08-31 LAB — APTT: aPTT: 42 seconds — ABNORMAL HIGH (ref 24–36)

## 2022-08-31 LAB — PROTIME-INR
INR: 2.1 — ABNORMAL HIGH (ref 0.8–1.2)
Prothrombin Time: 23 seconds — ABNORMAL HIGH (ref 11.4–15.2)

## 2022-08-31 LAB — GLUCOSE, CAPILLARY: Glucose-Capillary: 106 mg/dL — ABNORMAL HIGH (ref 70–99)

## 2022-08-31 LAB — BRAIN NATRIURETIC PEPTIDE: B Natriuretic Peptide: 455.8 pg/mL — ABNORMAL HIGH (ref 0.0–100.0)

## 2022-08-31 MED ORDER — SODIUM CHLORIDE 0.9 % IV SOLN
2.0000 g | Freq: Three times a day (TID) | INTRAVENOUS | Status: DC
  Administered 2022-08-31 – 2022-09-01 (×3): 2 g via INTRAVENOUS
  Filled 2022-08-31 (×3): qty 12.5

## 2022-08-31 MED ORDER — ALBUTEROL SULFATE (2.5 MG/3ML) 0.083% IN NEBU: 2.5000 mg | INHALATION_SOLUTION | Freq: Four times a day (QID) | RESPIRATORY_TRACT | Status: DC | PRN

## 2022-08-31 MED ORDER — FUROSEMIDE 10 MG/ML IJ SOLN
20.0000 mg | Freq: Once | INTRAMUSCULAR | Status: AC
  Administered 2022-08-31: 20 mg via INTRAVENOUS
  Filled 2022-08-31: qty 2

## 2022-08-31 MED ORDER — ONDANSETRON HCL 4 MG/2ML IJ SOLN: 4.0000 mg | Freq: Four times a day (QID) | INTRAMUSCULAR | Status: DC | PRN

## 2022-08-31 MED ORDER — MIRTAZAPINE 15 MG PO TABS
15.0000 mg | ORAL_TABLET | Freq: Every day | ORAL | Status: DC
  Administered 2022-08-31 – 2022-09-10 (×11): 15 mg via ORAL
  Filled 2022-08-31 (×11): qty 1

## 2022-08-31 MED ORDER — ONDANSETRON HCL 4 MG PO TABS: 4.0000 mg | ORAL_TABLET | Freq: Four times a day (QID) | ORAL | Status: DC | PRN

## 2022-08-31 MED ORDER — LACTATED RINGERS IV BOLUS
1000.0000 mL | Freq: Once | INTRAVENOUS | Status: AC
  Administered 2022-08-31: 1000 mL via INTRAVENOUS

## 2022-08-31 MED ORDER — VANCOMYCIN HCL IN DEXTROSE 1-5 GM/200ML-% IV SOLN
1000.0000 mg | Freq: Once | INTRAVENOUS | Status: AC
  Administered 2022-08-31: 1000 mg via INTRAVENOUS
  Filled 2022-08-31: qty 200

## 2022-08-31 MED ORDER — ALBUTEROL SULFATE HFA 108 (90 BASE) MCG/ACT IN AERS: 2.0000 | INHALATION_SPRAY | Freq: Four times a day (QID) | RESPIRATORY_TRACT | Status: DC | PRN

## 2022-08-31 MED ORDER — PROCHLORPERAZINE MALEATE 10 MG PO TABS: 10.0000 mg | ORAL_TABLET | Freq: Four times a day (QID) | ORAL | Status: DC | PRN

## 2022-08-31 MED ORDER — ATORVASTATIN CALCIUM 10 MG PO TABS
10.0000 mg | ORAL_TABLET | Freq: Every day | ORAL | Status: DC
  Administered 2022-08-31 – 2022-09-10 (×11): 10 mg via ORAL
  Filled 2022-08-31 (×11): qty 1

## 2022-08-31 MED ORDER — SODIUM CHLORIDE (PF) 0.9 % IJ SOLN
INTRAMUSCULAR | Status: AC
  Filled 2022-08-31: qty 50

## 2022-08-31 MED ORDER — ALPRAZOLAM 0.5 MG PO TABS
1.0000 mg | ORAL_TABLET | Freq: Every day | ORAL | Status: DC | PRN
  Administered 2022-09-01: 1 mg via ORAL
  Filled 2022-08-31: qty 2

## 2022-08-31 MED ORDER — ACETAMINOPHEN 500 MG PO TABS: 500.0000 mg | ORAL_TABLET | Freq: Four times a day (QID) | ORAL | Status: DC | PRN

## 2022-08-31 MED ORDER — INSULIN ASPART 100 UNIT/ML IJ SOLN
0.0000 [IU] | INTRAMUSCULAR | Status: DC
  Administered 2022-09-02 – 2022-09-10 (×13): 1 [IU] via SUBCUTANEOUS
  Administered 2022-09-10 – 2022-09-11 (×2): 2 [IU] via SUBCUTANEOUS

## 2022-08-31 MED ORDER — ALPRAZOLAM 0.5 MG PO TABS
1.0000 mg | ORAL_TABLET | Freq: Four times a day (QID) | ORAL | Status: DC
  Administered 2022-09-01 – 2022-09-05 (×16): 1 mg via ORAL
  Filled 2022-08-31 (×19): qty 2

## 2022-08-31 MED ORDER — VANCOMYCIN HCL 1250 MG/250ML IV SOLN
1250.0000 mg | INTRAVENOUS | Status: DC
  Administered 2022-09-01: 1250 mg via INTRAVENOUS
  Filled 2022-08-31: qty 250

## 2022-08-31 MED ORDER — SODIUM CHLORIDE 0.9 % IV SOLN
2.0000 g | Freq: Once | INTRAVENOUS | Status: AC
  Administered 2022-08-31: 2 g via INTRAVENOUS
  Filled 2022-08-31: qty 12.5

## 2022-08-31 MED ORDER — IOHEXOL 350 MG/ML SOLN
100.0000 mL | Freq: Once | INTRAVENOUS | Status: AC | PRN
  Administered 2022-08-31: 100 mL via INTRAVENOUS

## 2022-08-31 MED ORDER — LACTATED RINGERS IV BOLUS (SEPSIS)
1000.0000 mL | Freq: Once | INTRAVENOUS | Status: AC
  Administered 2022-08-31: 1000 mL via INTRAVENOUS

## 2022-08-31 MED ORDER — SODIUM CHLORIDE 0.9 % IV SOLN: INTRAVENOUS | Status: DC

## 2022-08-31 MED ORDER — LACTATED RINGERS IV BOLUS: 500.0000 mL | Freq: Once | INTRAVENOUS | Status: DC

## 2022-08-31 NOTE — ED Triage Notes (Signed)
Patient brought to the ER for evaluation of "breathing issues." Pt currently during chemo and radiation. Family with patient states his sister called and stated to come to the ER before going for his scan due to breathing issues. Pt has lung cancer.

## 2022-08-31 NOTE — ED Notes (Signed)
Pt to CT via stretcher

## 2022-08-31 NOTE — Sepsis Progress Note (Signed)
Code Sepsis protocol being monitored by e[employer]. 

## 2022-08-31 NOTE — ED Provider Notes (Signed)
[facility] Provider Note   CSN: [id number] Arrival date & time: 08/31/22  1026     History  Chief Complaint  Patient presents with   Shortness of Breath    Roger [name] is a 67 y.o. male.  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  He was post to have a CT of the chest abdomen pelvis done today but was noted to be hypoxic and hypotensive.  He denies any active complaints and states he has been feeling in his normal state of health.    Home Medications Prior to Admission medications   Medication Sig Start Date End Date Taking? Authorizing Provider  albuterol (PROVENTIL HFA;VENTOLIN HFA) 108 (90 Base) MCG/ACT inhaler Inhale 2 puffs into the lungs every 6 (six) hours as needed for wheezing or shortness of breath.    [provider]  ALPRAZolam Duanne Moron) 1 MG tablet Take 1 mg by mouth 5 (five) times daily. 8-12-4-8- float last pill    [provider]  apixaban (ELIQUIS) 5 MG TABS tablet Take 2 tablets ('10mg'$ ) twice daily for 7 days, then 1 tablet ('5mg'$ ) twice daily Patient taking differently: Take 10 mg by mouth 2 (two) times daily. 05/24/22   Heilingoetter, Cassandra L, PA-C  apixaban (ELIQUIS) 5 MG TABS tablet Take 1 tablet (5 mg total) by mouth 2 (two) times daily. 06/20/22   Heilingoetter, Cassandra L, PA-C  atenolol (TENORMIN) 25 MG tablet Take 0.5 tablets (12.5 mg total) by mouth daily. 06/04/22 07/04/22  Donne Hazel, MD  atorvastatin (LIPITOR) 10 MG tablet Take 10 mg by mouth every evening.    [provider]  fluticasone (FLONASE) 50 MCG/ACT nasal spray Place 2 sprays into both nostrils daily as needed for allergies. 01/13/22   [provider]  lidocaine-prilocaine (EMLA) cream Apply to the  Port-A-Cath site 30-60 minutes before treatment. 05/03/22   Curt Bears, MD  metFORMIN (GLUCOPHAGE-XR) 500 MG 24 hr tablet Take 500 mg by mouth every evening. 03/20/22   [provider]  mirtazapine (REMERON) 15 MG tablet Take 1 tablet (15 mg total) by mouth at bedtime. 06/09/22   Heilingoetter, Cassandra L, PA-C  prochlorperazine (COMPAZINE) 10 MG tablet Take 1 tablet (10 mg total) by mouth every 6 (six) hours as needed for nausea or vomiting. 05/03/22   Curt Bears, MD      Allergies    Demerol, Erythromycin, Prednisone, Sulfa antibiotics, and Augmentin [amoxicillin-pot clavulanate]    Review of Systems   Review of Systems  Physical Exam Updated Vital Signs BP 96/78   Pulse (!) 111   Temp 97.6 F (36.4 C) (Oral)   Resp (!) 22   Ht '5\' 11"'$  (1.803 m)   Wt 59.9 kg   SpO2 90%   BMI 18.41 kg/m  Physical Exam Vitals and nursing note reviewed.  Constitutional:      General: He is not in acute distress.    Appearance: He is well-developed.  HENT:     Head: Normocephalic and atraumatic.  Eyes:     Conjunctiva/sclera: Conjunctivae normal.  Cardiovascular:     Rate and Rhythm: Normal rate and regular rhythm.     Heart sounds: No murmur heard. Pulmonary:     Effort: Pulmonary effort is normal. No respiratory distress.  Breath sounds: Examination of the left-upper field reveals decreased breath sounds. Examination of the left-lower field reveals decreased breath sounds. Decreased breath sounds present.  Abdominal:     Palpations: Abdomen is soft.     Tenderness: There is no abdominal tenderness.  Musculoskeletal:        General: No swelling.     Cervical back: Neck supple.  Skin:    General: Skin is warm and dry.     Capillary Refill: Capillary refill takes less than 2 seconds.  Neurological:     Mental Status: He is alert.  Psychiatric:        Mood and Affect: Mood normal.     ED Results / Procedures / Treatments   Labs (all labs ordered are listed,  but only abnormal results are displayed) Labs Reviewed  LACTIC ACID, PLASMA - Abnormal; Notable for the following components:      Result Value   Lactic Acid, Venous 4.7 (*)    All other components within normal limits  LACTIC ACID, PLASMA - Abnormal; Notable for the following components:   Lactic Acid, Venous 2.7 (*)    All other components within normal limits  COMPREHENSIVE METABOLIC PANEL - Abnormal; Notable for the following components:   Glucose, Bld 185 (*)    Calcium 10.9 (*)    Albumin 2.8 (*)    Alkaline Phosphatase 129 (*)    All other components within normal limits  CBC WITH DIFFERENTIAL/PLATELET - Abnormal; Notable for the following components:   WBC 13.0 (*)    RBC 3.93 (*)    Hemoglobin 11.1 (*)    HCT 35.9 (*)    RDW 15.6 (*)    Neutro Abs 11.7 (*)    Lymphs Abs 0.5 (*)    All other components within normal limits  PROTIME-INR - Abnormal; Notable for the following components:   Prothrombin Time 23.0 (*)    INR 2.1 (*)    All other components within normal limits  APTT - Abnormal; Notable for the following components:   aPTT 42 (*)    All other components within normal limits  URINALYSIS, W/ REFLEX TO CULTURE (INFECTION SUSPECTED) - Abnormal; Notable for the following components:   APPearance HAZY (*)    All other components within normal limits  BRAIN NATRIURETIC PEPTIDE - Abnormal; Notable for the following components:   B Natriuretic Peptide 455.8 (*)    All other components within normal limits  BLOOD GAS, VENOUS - Abnormal; Notable for the following components:   pO2, Ven <31 (*)    All other components within normal limits  CULTURE, BLOOD (ROUTINE X 2)  CULTURE, BLOOD (ROUTINE X 2)    EKG None  Radiology CT Angio Chest PE W and/or Wo Contrast  Result Date: 08/31/2022 CLINICAL DATA:  Stage IV bronchogenic carcinoma. Acute pulmonary embolus in November 2023. Current chemotherapy and radiation therapy. Difficulty breathing and possible sepsis. *  Tracking Code: BO * EXAM: CT ANGIOGRAPHY CHEST CT ABDOMEN AND PELVIS WITH CONTRAST TECHNIQUE: Multidetector CT imaging of the chest was performed using the standard protocol during bolus administration of intravenous contrast. Multiplanar CT image reconstructions and MIPs were obtained to evaluate the vascular anatomy. Multidetector CT imaging of the abdomen and pelvis was performed using the standard protocol during bolus administration of intravenous contrast. RADIATION DOSE REDUCTION: This exam was performed according to the departmental dose-optimization program which includes automated exposure control, adjustment of the mA and/or kV according to patient size and/or use of iterative reconstruction technique.  CONTRAST:  195m OMNIPAQUE IOHEXOL 350 MG/ML SOLN COMPARISON:  Multiple exams, including CTA chest 05/31/2022 and PET-CT 03/24/2022 FINDINGS: CTA CHEST FINDINGS Cardiovascular: No current filling defect in the pulmonary arterial tree to suggest active pulmonary embolus. Coronary, aortic arch, and branch vessel atherosclerotic vascular disease. Mediastinum/Nodes: Right paratracheal node 0.9 cm in short axis on image 37 series 3, formerly the same. Right eccentric subcarinal adenopathy 1.4 cm in short axis on image 47 series 3, previously 1.8 cm. Lungs/Pleura: Severe emphysema. 1.9 by 1.4 cm spiculated right lower lobe pulmonary nodule on image 95 series 5, very ill-defined margins, previously about 1.8 by 1.4 cm on 05/31/2022. Interstitial accentuation and hazy ground-glass opacity in the right lung and aerated portion of the left lung. Drowned lung appearance of the left lower lobe with complete consolidation and some volume loss in the left lower lobe. There is likewise passive atelectasis of much of the left upper lobe. The cavitary masses in the left upper lobe and left lower lobe are somewhat obscured, but hypodensity suggesting underlying mass measuring 6.6 by 5.6 cm in the left lower lobe observed on  image 65 series 3. A left upper lobe masslike process peripherally measures 5.5 by 3.5 cm on image 50 series 3. Both are roughly similar to the 05/31/2022 exam. The large left pleural effusion is associated with some mild rightward shift of cardiac and mediastinal structures. Malignant effusion is a distinct possibility. Musculoskeletal: Lytic and primarily sclerotic lesion of the right anterior sixth rib noted on image 124 series 5, previously highly hypermetabolic on PET-CT, overall increased sclerosis and lucency in this vicinity from 05/31/2022, compatible with progressive osseous metastatic disease. Sclerotic osseous lesion in the right anterior eighth rib compatible with malignancy, increase conspicuity. Multiple sclerotic osseous metastatic lesions in the thoracic spine including the T5, T6, T10, and T12 levels compatible with malignancy, increase conspicuity from 05/31/2022. Review of the MIP images confirms the above findings. CT ABDOMEN and PELVIS FINDINGS Hepatobiliary: Unremarkable Pancreas: Unremarkable Spleen: Unremarkable Adrenals/Urinary Tract: Unremarkable Stomach/Bowel: Prominent stool throughout the colon favors constipation. Prominence of stool in the rectal vault favoring fecal impaction. No compelling evidence of stercoral colitis. Vascular/Lymphatic: Atherosclerosis is present, including aortoiliac atherosclerotic disease. No pathologic intra-abdominal adenopathy. Reproductive: Unremarkable Other: Minimal hazy stranding in the omentum and mesentery likely reflecting faint third spacing of fluid. Musculoskeletal: Soft tissue mass involving the left anterior rectus abdominus and adjacent subcutaneous tissues extending to the cutaneous surface, measuring 2.3 by 2.3 cm on image 24 series 3, previously 3.0 by 3.4 cm on 05/31/2022, and previously hypermetabolic prior PET-CT. Dominant sclerotic metastatic lesion in the L4 vertebral body measures 2.2 by 2.0 cm on image 57 series 7. This lesion was  previously about 0.8 cm in diameter on 03/24/2022. New sclerotic lesions in the left iliac bone include a 1.1 by 0.8 cm lesion on image 59 of series 3 and a 0.7 cm lesion posteromedially on image 56 series 3. Review of the MIP images confirms the above findings. IMPRESSION: 1. No current filling defect in the pulmonary arterial tree to suggest active pulmonary embolus. 2. Drowned lung appearance of the left lower lobe with complete consolidation and some volume loss in the left lower lobe, and passive atelectasis of much of the left upper lobe. The cavitary masses in the left upper lobe and left lower lobe are roughly similar to 05/31/2022. 3. Mild hazy interstitial accentuation in both lungs along with faint ground-glass opacities, potentially from edema or atypical pneumonia. 4. Large left pleural effusion is  associated with some mild rightward shift of cardiac and mediastinal structures. Malignant effusion is a distinct possibility. 5. Increased conspicuity and size of scattered osseous metastatic lesions. 6. The previously hypermetabolic left anterior abdominal wall mass has reduced in size compared to 05/31/2022. 7. Stable spiculated right lower lobe pulmonary nodule (previously hypermetabolic). 8. Prominent stool throughout the colon favors constipation. Prominence of stool in the rectal vault favoring fecal impaction. No compelling evidence of stercoral colitis. 9. Aortic atherosclerosis. Aortic Atherosclerosis (ICD10-I70.0) and Emphysema (ICD10-J43.9). Electronically Signed   By: Van Clines M.D.   On: 08/31/2022 14:33   CT ABDOMEN PELVIS W CONTRAST  Result Date: 08/31/2022 CLINICAL DATA:  Stage IV bronchogenic carcinoma. Acute pulmonary embolus in November 2023. Current chemotherapy and radiation therapy. Difficulty breathing and possible sepsis. * Tracking Code: BO * EXAM: CT ANGIOGRAPHY CHEST CT ABDOMEN AND PELVIS WITH CONTRAST TECHNIQUE: Multidetector CT imaging of the chest was performed  using the standard protocol during bolus administration of intravenous contrast. Multiplanar CT image reconstructions and MIPs were obtained to evaluate the vascular anatomy. Multidetector CT imaging of the abdomen and pelvis was performed using the standard protocol during bolus administration of intravenous contrast. RADIATION DOSE REDUCTION: This exam was performed according to the departmental dose-optimization program which includes automated exposure control, adjustment of the mA and/or kV according to patient size and/or use of iterative reconstruction technique. CONTRAST:  19m OMNIPAQUE IOHEXOL 350 MG/ML SOLN COMPARISON:  Multiple exams, including CTA chest 05/31/2022 and PET-CT 03/24/2022 FINDINGS: CTA CHEST FINDINGS Cardiovascular: No current filling defect in the pulmonary arterial tree to suggest active pulmonary embolus. Coronary, aortic arch, and branch vessel atherosclerotic vascular disease. Mediastinum/Nodes: Right paratracheal node 0.9 cm in short axis on image 37 series 3, formerly the same. Right eccentric subcarinal adenopathy 1.4 cm in short axis on image 47 series 3, previously 1.8 cm. Lungs/Pleura: Severe emphysema. 1.9 by 1.4 cm spiculated right lower lobe pulmonary nodule on image 95 series 5, very ill-defined margins, previously about 1.8 by 1.4 cm on 05/31/2022. Interstitial accentuation and hazy ground-glass opacity in the right lung and aerated portion of the left lung. Drowned lung appearance of the left lower lobe with complete consolidation and some volume loss in the left lower lobe. There is likewise passive atelectasis of much of the left upper lobe. The cavitary masses in the left upper lobe and left lower lobe are somewhat obscured, but hypodensity suggesting underlying mass measuring 6.6 by 5.6 cm in the left lower lobe observed on image 65 series 3. A left upper lobe masslike process peripherally measures 5.5 by 3.5 cm on image 50 series 3. Both are roughly similar to the  05/31/2022 exam. The large left pleural effusion is associated with some mild rightward shift of cardiac and mediastinal structures. Malignant effusion is a distinct possibility. Musculoskeletal: Lytic and primarily sclerotic lesion of the right anterior sixth rib noted on image 124 series 5, previously highly hypermetabolic on PET-CT, overall increased sclerosis and lucency in this vicinity from 05/31/2022, compatible with progressive osseous metastatic disease. Sclerotic osseous lesion in the right anterior eighth rib compatible with malignancy, increase conspicuity. Multiple sclerotic osseous metastatic lesions in the thoracic spine including the T5, T6, T10, and T12 levels compatible with malignancy, increase conspicuity from 05/31/2022. Review of the MIP images confirms the above findings. CT ABDOMEN and PELVIS FINDINGS Hepatobiliary: Unremarkable Pancreas: Unremarkable Spleen: Unremarkable Adrenals/Urinary Tract: Unremarkable Stomach/Bowel: Prominent stool throughout the colon favors constipation. Prominence of stool in the rectal vault favoring fecal impaction. No compelling  evidence of stercoral colitis. Vascular/Lymphatic: Atherosclerosis is present, including aortoiliac atherosclerotic disease. No pathologic intra-abdominal adenopathy. Reproductive: Unremarkable Other: Minimal hazy stranding in the omentum and mesentery likely reflecting faint third spacing of fluid. Musculoskeletal: Soft tissue mass involving the left anterior rectus abdominus and adjacent subcutaneous tissues extending to the cutaneous surface, measuring 2.3 by 2.3 cm on image 24 series 3, previously 3.0 by 3.4 cm on 05/31/2022, and previously hypermetabolic prior PET-CT. Dominant sclerotic metastatic lesion in the L4 vertebral body measures 2.2 by 2.0 cm on image 57 series 7. This lesion was previously about 0.8 cm in diameter on 03/24/2022. New sclerotic lesions in the left iliac bone include a 1.1 by 0.8 cm lesion on image 59 of  series 3 and a 0.7 cm lesion posteromedially on image 56 series 3. Review of the MIP images confirms the above findings. IMPRESSION: 1. No current filling defect in the pulmonary arterial tree to suggest active pulmonary embolus. 2. Drowned lung appearance of the left lower lobe with complete consolidation and some volume loss in the left lower lobe, and passive atelectasis of much of the left upper lobe. The cavitary masses in the left upper lobe and left lower lobe are roughly similar to 05/31/2022. 3. Mild hazy interstitial accentuation in both lungs along with faint ground-glass opacities, potentially from edema or atypical pneumonia. 4. Large left pleural effusion is associated with some mild rightward shift of cardiac and mediastinal structures. Malignant effusion is a distinct possibility. 5. Increased conspicuity and size of scattered osseous metastatic lesions. 6. The previously hypermetabolic left anterior abdominal wall mass has reduced in size compared to 05/31/2022. 7. Stable spiculated right lower lobe pulmonary nodule (previously hypermetabolic). 8. Prominent stool throughout the colon favors constipation. Prominence of stool in the rectal vault favoring fecal impaction. No compelling evidence of stercoral colitis. 9. Aortic atherosclerosis. Aortic Atherosclerosis (ICD10-I70.0) and Emphysema (ICD10-J43.9). Electronically Signed   By: Van Clines M.D.   On: 08/31/2022 14:33   DG Chest Port 1 View  Result Date: 08/31/2022 CLINICAL DATA:  Lung cancer with ongoing chemotherapy and radiation therapy, breathing difficulty, questionable sepsis EXAM: PORTABLE CHEST 1 VIEW COMPARISON:  Portable exam 1102 hours compared to 06/01/2022 FINDINGS: Upper normal size of cardiac silhouette. Mediastinal contours and pulmonary vascularity normal. Chronic interstitial infiltrates. Persistent opacities in the LEFT mid lung and LEFT lower lobe with increased LEFT pleural effusion and LEFT lung volume loss since  previous study. No pneumothorax or acute osseous findings. IMPRESSION: Chronic interstitial lung disease. Persistent LEFT lung opacities in LEFT upper and LEFT lower lobes with increased LEFT pleural effusion and basilar atelectasis since prior study. Electronically Signed   By: Lavonia Dana M.D.   On: 08/31/2022 11:10    Procedures Procedures    Medications Ordered in ED Medications  vancomycin (VANCOCIN) IVPB 1000 mg/200 mL premix (1,000 mg Intravenous New Bag/Given 08/31/22 1446)  lactated ringers bolus 1,000 mL (0 mLs Intravenous Stopped 08/31/22 1317)  ceFEPIme (MAXIPIME) 2 g in sodium chloride 0.9 % 100 mL IVPB (0 g Intravenous Stopped 08/31/22 1448)  sodium chloride (PF) 0.9 % injection (  Given by Other 08/31/22 1357)  iohexol (OMNIPAQUE) 350 MG/ML injection 100 mL (100 mLs Intravenous Contrast Given 08/31/22 1332)  lactated ringers bolus 1,000 mL (1,000 mLs Intravenous New Bag/Given 08/31/22 1400)    ED Course/ Medical Decision Making/ A&P Clinical Course as of 08/31/22 1538  Wed Aug 31, 2022  1114 Evaluated at bedside. He is a 67 year old male with active lung cancer undergoing  therapy at this time.  He was presenting for cross-sectional imaging when he was found to be hypoxic brought down to emergency department for further care and management.  Hypotensive 70s over 50s on arrival.  Tachycardic to the 130s on initial EKG appearing to be a flutter. Patient denies any symptoms, he was asymptomatic today only came to emergency department per recommendation of radiology team.  [CC]    Clinical Course User Index [CC] Tretha Sciara, MD                             Medical Decision Making Amount and/or Complexity of Data Reviewed Labs: ordered. Radiology: ordered. ECG/medicine tests: ordered.  Risk Prescription drug management.   67 year old gentleman presenting with hypoxia and hypotension.  He was placed on 2 L nasal cannula.  Lung sounds diminished on the left but otherwise  nontoxic-appearing. No wheezing, lower suspicion for COPD exacerbation.  Differential includes sepsis versus pneumothorax versus pleural effusion versus pneumothorax Labs ordered, reviewed, CBC with leukocytosis of 13, hemoglobin stable, VBG normal, UA without evidence of infection, CMP without any electrode abnormalities, lactic acid initially 4.7 improved 2.7 after fluids.  BNP mildly elevated at 455.  Chest x-ray x-ray ordered, reviewed, agree with radiology with persistent left pleural effusion.  A PE study was ordered, reviewed, agree with radiology read. Did not reveal PE but did show a dry left lung appearance of the left lower lobe with complete consolidation and some volume loss, cavitary masses in the left upper lobe left upper lobe roughly similar to prior.  Increased osseous metastases.  CT of the abdomen pelvis with a hypermetabolic left abdominal mass which is reduced in size from prior imaging.  Also evidence of some constipation seen.  He was started on broad-spectrum antibiotics and received volume resuscitation.  EKG revealed new onset atrial fibrillation, though rate controlled.  Spoke with oncology team. He will likely need IR drainage of this. Hospitalist consulted for admission.   This was a shared visit with my supervising physician Dr. Oswald Hillock who independently saw and evaluated the patient & provided guidance in evaluation/management/disposition ,in agreement with care   Final Clinical Impression(s) / ED Diagnoses Final diagnoses:  Pleural effusion, left  Acute hypoxic respiratory failure [facility])    Rx / DC Orders ED Discharge Orders     None         Lyndel Safe 08/31/22 1539    Tretha Sciara, MD 09/03/22 1133

## 2022-08-31 NOTE — H&P (Addendum)
History and Physical    RAYLYN ARONS [id number] DOB: [birth year] DOA: 08/31/2022  PCP: Aletha Halim., PA-C  Patient coming from: home  I have personally briefly reviewed patient's old medical records in [place]  Chief Complaint: sob  HPI: Roger [name] is a 67 y.o. 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 Per patient  he had radiology appointment today and prior to presenting for appoint his sister noted he was very short of breath and weak. Due to this patient was brought to ED.  Per patient he states over the last week he has had increase sob and due to this he was using his O2 more frequently. He again noted that symptoms have progressed over the last 48 hours with associated fatigue. He notes no fever/chills/ increase productive cough , n/v/d or abdominal pain. He states that he has low appetite as he is not able to taste his found. Currently he states s/p treatment in ED he feels improved.     ED Course:  Vitals: afeb, BP 77/51 ( 105/79), HR 53 , rr 20 sat 95% on 2.5 L   Cxr: IMPRESSION: Chronic interstitial lung disease.   Persistent LEFT lung opacities in LEFT upper and LEFT lower lobes with increased LEFT pleural effusion and basilar atelectasis since prior study.   EKG: ? Afib , sinus with junctional escape   Vbg: 7.39, pco2 44  Wbc 13, hgb 11.1,  INR 2.1 CTPA MPRESSION: 1. No current filling defect in the pulmonary arterial tree to suggest active pulmonary embolus. 2. Drowned lung appearance of the left lower lobe with complete consolidation and some volume loss in the left lower lobe, and passive atelectasis of much of the left upper lobe. The cavitary masses in the left upper lobe and left lower lobe are roughly similar to 05/31/2022. 3. Mild hazy interstitial accentuation in both lungs along with faint ground-glass opacities, potentially  from edema or atypical pneumonia. 4. Large left pleural effusion is associated with some mild rightward shift of cardiac and mediastinal structures. Malignant effusion is a distinct possibility. 5. Increased conspicuity and size of scattered osseous metastatic lesions. 6. The previously hypermetabolic left anterior abdominal wall mass has reduced in size compared to 05/31/2022. 7. Stable spiculated right lower lobe pulmonary nodule (previously hypermetabolic). 8. Prominent stool throughout the colon favors constipation. Prominence of stool in the rectal vault favoring fecal impaction. No compelling evidence of stercoral colitis. 9. Aortic atherosclerosis. Review of Systems: As per HPI otherwise 10 point review of systems negative.   Past Medical History:  Diagnosis Date   Anxiety    Asthma    COPD (chronic obstructive pulmonary disease) ([place])    Depression    Hemorrhoids    History of radiation therapy    Abdomen- 06/08/22-06/30/22- Dr. Gery Pray   History of rectal bleeding    Hypertension    Palpitations    Pneumonia    in 2015   Post-operative nausea and vomiting    pt unsure   Primary squamous cell carcinoma of left lung ([place]) 05/03/2022    Past Surgical History:  Procedure Laterality Date   DENTAL SURGERY     MASS EXCISION Right 04/20/2022   Procedure: NEEDLE BIOPSY OF CHEST WALL MASS RIGHT LOWER CHEST WALL;  Surgeon: [name], MD;  Location: [facility];  Service: Thoracic;  Laterality: Right;   NOSE SURGERY  SKIN CANCER EXCISION     left forehead   TONSILLECTOMY     VIDEO BRONCHOSCOPY WITH ENDOBRONCHIAL NAVIGATION N/A 04/20/2022   Procedure: VIDEO BRONCHOSCOPY WITH ENDOBRONCHIAL NAVIGATION;  Surgeon: [name], MD;  Location: [facility];  Service: Thoracic;  Laterality: N/A;     reports that he has quit smoking. His smoking use included cigarettes. He has a 12.50 pack-year smoking history. He has never used smokeless tobacco. He reports that  he does not drink alcohol and does not use drugs.  Allergies  Allergen Reactions   Demerol Palpitations and Other (See Comments)    Heart beats fast   Erythromycin Nausea And Vomiting   Prednisone Other (See Comments)    "felt weird"   Sulfa Antibiotics Nausea And Vomiting and Other (See Comments)    hematoemesis   Augmentin [Amoxicillin-Pot Clavulanate] Nausea And Vomiting    Family History  Problem Relation Age of Onset   Anxiety disorder Mother    Depression Mother    Lung disease Mother        "arthritic lung"   Anxiety disorder Sister    Depression Sister    Anxiety disorder Brother    Lung disease Father    Lung cancer Father    Anxiety disorder Sister    Anxiety disorder Brother    Anxiety disorder Brother    Anxiety disorder Brother    Colon cancer Brother     Prior to Admission medications   Medication Sig Start Date End Date Taking? Authorizing Provider  albuterol (PROVENTIL HFA;VENTOLIN HFA) 108 (90 Base) MCG/ACT inhaler Inhale 2 puffs into the lungs every 6 (six) hours as needed for wheezing or shortness of breath.    [provider]  ALPRAZolam Duanne Moron) 1 MG tablet Take 1 mg by mouth 5 (five) times daily. 8-12-4-8- float last pill    [provider]  apixaban (ELIQUIS) 5 MG TABS tablet Take 2 tablets ('10mg'$ ) twice daily for 7 days, then 1 tablet ('5mg'$ ) twice daily Patient taking differently: Take 10 mg by mouth 2 (two) times daily. 05/24/22   Heilingoetter, Cassandra L, PA-C  apixaban (ELIQUIS) 5 MG TABS tablet Take 1 tablet (5 mg total) by mouth 2 (two) times daily. 06/20/22   Heilingoetter, Cassandra L, PA-C  atenolol (TENORMIN) 25 MG tablet Take 0.5 tablets (12.5 mg total) by mouth daily. 06/04/22 07/04/22  Donne Hazel, MD  atorvastatin (LIPITOR) 10 MG tablet Take 10 mg by mouth every evening.    [provider]  fluticasone (FLONASE) 50 MCG/ACT nasal spray Place 2 sprays into both nostrils daily as needed for allergies. 01/13/22    [provider]  lidocaine-prilocaine (EMLA) cream Apply to the Port-A-Cath site 30-60 minutes before treatment. 05/03/22   Curt Bears, MD  metFORMIN (GLUCOPHAGE-XR) 500 MG 24 hr tablet Take 500 mg by mouth every evening. 03/20/22   [provider]  mirtazapine (REMERON) 15 MG tablet Take 1 tablet (15 mg total) by mouth at bedtime. 06/09/22   Heilingoetter, Cassandra L, PA-C  prochlorperazine (COMPAZINE) 10 MG tablet Take 1 tablet (10 mg total) by mouth every 6 (six) hours as needed for nausea or vomiting. 05/03/22   Curt Bears, MD    Physical Exam: Vitals:   08/31/22 1500 08/31/22 1515 08/31/22 1530 08/31/22 1545  BP: 115/84 108/86 114/82 110/77  Pulse: 83 60 94 84  Resp: (!) 21 19 (!) 23 (!) 26  Temp:      TempSrc:      SpO2: 92% 90% 91%  98%  Weight:      Height:        Constitutional: NAD, calm, comfortable Vitals:   08/31/22 1500 08/31/22 1515 08/31/22 1530 08/31/22 1545  BP: 115/84 108/86 114/82 110/77  Pulse: 83 60 94 84  Resp: (!) 21 19 (!) 23 (!) 26  Temp:      TempSrc:      SpO2: 92% 90% 91% 98%  Weight:      Height:       Eyes: PERRL, lids and conjunctivae normal ENMT: Mucous membranes are moist. Posterior pharynx clear of any exudate or lesions.Normal dentition.  Neck: normal, supple, no masses, no thyromegaly Respiratory: diminished b/l. Normal respiratory effort. No accessory muscle use.  Cardiovascular: Regular rate and rhythm, no murmurs / rubs / gallops. No extremity edema. 2+ pedal pulses. Abdomen: no tenderness, no masses palpated. No hepatosplenomegaly. Bowel sounds positive.  Musculoskeletal: no clubbing / cyanosis. No joint deformity upper and lower extremities. Good ROM, no contractures. Normal muscle tone.  Skin: no rashes, lesions, ulcers. No induration Neurologic: CN 2-12 grossly intact. Sensation intact, DTR normal. Strength 5/5 in all 4.  Psychiatric: Normal judgment and insight. Alert and oriented x 3. Normal mood.     Labs on Admission: I have personally reviewed following labs and imaging studies  CBC: Recent Labs  Lab 08/31/22 1146  WBC 13.0*  NEUTROABS 11.7*  HGB 11.1*  HCT 35.9*  MCV 91.3  PLT 123456   Basic Metabolic Panel: Recent Labs  Lab 08/31/22 1146  NA 135  K 3.8  CL 101  CO2 24  GLUCOSE 185*  BUN 17  CREATININE 0.93  CALCIUM 10.9*   GFR: Estimated Creatinine Clearance: 67.1 mL/min (by C-G formula based on SCr of 0.93 mg/dL). Liver Function Tests: Recent Labs  Lab 08/31/22 1146  AST 22  ALT 14  ALKPHOS 129*  BILITOT 1.0  PROT 7.6  ALBUMIN 2.8*   No results for input(s): "LIPASE", "AMYLASE" in the last 168 hours. No results for input(s): "AMMONIA" in the last 168 hours. Coagulation Profile: Recent Labs  Lab 08/31/22 1146  INR 2.1*   Cardiac Enzymes: No results for input(s): "CKTOTAL", "CKMB", "CKMBINDEX", "TROPONINI" in the last 168 hours. BNP (last 3 results) No results for input(s): "PROBNP" in the last 8760 hours. HbA1C: No results for input(s): "HGBA1C" in the last 72 hours. CBG: No results for input(s): "GLUCAP" in the last 168 hours. Lipid Profile: No results for input(s): "CHOL", "HDL", "LDLCALC", "TRIG", "CHOLHDL", "LDLDIRECT" in the last 72 hours. Thyroid Function Tests: No results for input(s): "TSH", "T4TOTAL", "FREET4", "T3FREE", "THYROIDAB" in the last 72 hours. Anemia Panel: No results for input(s): "VITAMINB12", "FOLATE", "FERRITIN", "TIBC", "IRON", "RETICCTPCT" in the last 72 hours. Urine analysis:    Component Value Date/Time   COLORURINE YELLOW 08/31/2022 1236   APPEARANCEUR HAZY (A) 08/31/2022 1236   LABSPEC 1.019 08/31/2022 1236   PHURINE 5.0 08/31/2022 1236   GLUCOSEU NEGATIVE 08/31/2022 1236   HGBUR NEGATIVE 08/31/2022 1236   BILIRUBINUR NEGATIVE 08/31/2022 1236   KETONESUR NEGATIVE 08/31/2022 1236   PROTEINUR NEGATIVE 08/31/2022 1236   UROBILINOGEN 0.2 10/15/2014 1658   NITRITE NEGATIVE 08/31/2022 1236   LEUKOCYTESUR  NEGATIVE 08/31/2022 1236    Radiological Exams on Admission: CT Angio Chest PE W and/or Wo Contrast  Result Date: 08/31/2022 CLINICAL DATA:  Stage IV bronchogenic carcinoma. Acute pulmonary embolus in November 2023. Current chemotherapy and radiation therapy. Difficulty breathing and possible sepsis. * Tracking Code: BO * EXAM: CT ANGIOGRAPHY CHEST CT ABDOMEN  AND PELVIS WITH CONTRAST TECHNIQUE: Multidetector CT imaging of the chest was performed using the standard protocol during bolus administration of intravenous contrast. Multiplanar CT image reconstructions and MIPs were obtained to evaluate the vascular anatomy. Multidetector CT imaging of the abdomen and pelvis was performed using the standard protocol during bolus administration of intravenous contrast. RADIATION DOSE REDUCTION: This exam was performed according to the departmental dose-optimization program which includes automated exposure control, adjustment of the mA and/or kV according to patient size and/or use of iterative reconstruction technique. CONTRAST:  125m OMNIPAQUE IOHEXOL 350 MG/ML SOLN COMPARISON:  Multiple exams, including CTA chest 05/31/2022 and PET-CT 03/24/2022 FINDINGS: CTA CHEST FINDINGS Cardiovascular: No current filling defect in the pulmonary arterial tree to suggest active pulmonary embolus. Coronary, aortic arch, and branch vessel atherosclerotic vascular disease. Mediastinum/Nodes: Right paratracheal node 0.9 cm in short axis on image 37 series 3, formerly the same. Right eccentric subcarinal adenopathy 1.4 cm in short axis on image 47 series 3, previously 1.8 cm. Lungs/Pleura: Severe emphysema. 1.9 by 1.4 cm spiculated right lower lobe pulmonary nodule on image 95 series 5, very ill-defined margins, previously about 1.8 by 1.4 cm on 05/31/2022. Interstitial accentuation and hazy ground-glass opacity in the right lung and aerated portion of the left lung. Drowned lung appearance of the left lower lobe with complete  consolidation and some volume loss in the left lower lobe. There is likewise passive atelectasis of much of the left upper lobe. The cavitary masses in the left upper lobe and left lower lobe are somewhat obscured, but hypodensity suggesting underlying mass measuring 6.6 by 5.6 cm in the left lower lobe observed on image 65 series 3. A left upper lobe masslike process peripherally measures 5.5 by 3.5 cm on image 50 series 3. Both are roughly similar to the 05/31/2022 exam. The large left pleural effusion is associated with some mild rightward shift of cardiac and mediastinal structures. Malignant effusion is a distinct possibility. Musculoskeletal: Lytic and primarily sclerotic lesion of the right anterior sixth rib noted on image 124 series 5, previously highly hypermetabolic on PET-CT, overall increased sclerosis and lucency in this vicinity from 05/31/2022, compatible with progressive osseous metastatic disease. Sclerotic osseous lesion in the right anterior eighth rib compatible with malignancy, increase conspicuity. Multiple sclerotic osseous metastatic lesions in the thoracic spine including the T5, T6, T10, and T12 levels compatible with malignancy, increase conspicuity from 05/31/2022. Review of the MIP images confirms the above findings. CT ABDOMEN and PELVIS FINDINGS Hepatobiliary: Unremarkable Pancreas: Unremarkable Spleen: Unremarkable Adrenals/Urinary Tract: Unremarkable Stomach/Bowel: Prominent stool throughout the colon favors constipation. Prominence of stool in the rectal vault favoring fecal impaction. No compelling evidence of stercoral colitis. Vascular/Lymphatic: Atherosclerosis is present, including aortoiliac atherosclerotic disease. No pathologic intra-abdominal adenopathy. Reproductive: Unremarkable Other: Minimal hazy stranding in the omentum and mesentery likely reflecting faint third spacing of fluid. Musculoskeletal: Soft tissue mass involving the left anterior rectus abdominus and  adjacent subcutaneous tissues extending to the cutaneous surface, measuring 2.3 by 2.3 cm on image 24 series 3, previously 3.0 by 3.4 cm on 05/31/2022, and previously hypermetabolic prior PET-CT. Dominant sclerotic metastatic lesion in the L4 vertebral body measures 2.2 by 2.0 cm on image 57 series 7. This lesion was previously about 0.8 cm in diameter on 03/24/2022. New sclerotic lesions in the left iliac bone include a 1.1 by 0.8 cm lesion on image 59 of series 3 and a 0.7 cm lesion posteromedially on image 56 series 3. Review of the MIP images confirms the above  findings. IMPRESSION: 1. No current filling defect in the pulmonary arterial tree to suggest active pulmonary embolus. 2. Drowned lung appearance of the left lower lobe with complete consolidation and some volume loss in the left lower lobe, and passive atelectasis of much of the left upper lobe. The cavitary masses in the left upper lobe and left lower lobe are roughly similar to 05/31/2022. 3. Mild hazy interstitial accentuation in both lungs along with faint ground-glass opacities, potentially from edema or atypical pneumonia. 4. Large left pleural effusion is associated with some mild rightward shift of cardiac and mediastinal structures. Malignant effusion is a distinct possibility. 5. Increased conspicuity and size of scattered osseous metastatic lesions. 6. The previously hypermetabolic left anterior abdominal wall mass has reduced in size compared to 05/31/2022. 7. Stable spiculated right lower lobe pulmonary nodule (previously hypermetabolic). 8. Prominent stool throughout the colon favors constipation. Prominence of stool in the rectal vault favoring fecal impaction. No compelling evidence of stercoral colitis. 9. Aortic atherosclerosis. Aortic Atherosclerosis (ICD10-I70.0) and Emphysema (ICD10-J43.9). Electronically Signed   By: Van Clines M.D.   On: 08/31/2022 14:33   CT ABDOMEN PELVIS W CONTRAST  Result Date: 08/31/2022 CLINICAL  DATA:  Stage IV bronchogenic carcinoma. Acute pulmonary embolus in November 2023. Current chemotherapy and radiation therapy. Difficulty breathing and possible sepsis. * Tracking Code: BO * EXAM: CT ANGIOGRAPHY CHEST CT ABDOMEN AND PELVIS WITH CONTRAST TECHNIQUE: Multidetector CT imaging of the chest was performed using the standard protocol during bolus administration of intravenous contrast. Multiplanar CT image reconstructions and MIPs were obtained to evaluate the vascular anatomy. Multidetector CT imaging of the abdomen and pelvis was performed using the standard protocol during bolus administration of intravenous contrast. RADIATION DOSE REDUCTION: This exam was performed according to the departmental dose-optimization program which includes automated exposure control, adjustment of the mA and/or kV according to patient size and/or use of iterative reconstruction technique. CONTRAST:  177m OMNIPAQUE IOHEXOL 350 MG/ML SOLN COMPARISON:  Multiple exams, including CTA chest 05/31/2022 and PET-CT 03/24/2022 FINDINGS: CTA CHEST FINDINGS Cardiovascular: No current filling defect in the pulmonary arterial tree to suggest active pulmonary embolus. Coronary, aortic arch, and branch vessel atherosclerotic vascular disease. Mediastinum/Nodes: Right paratracheal node 0.9 cm in short axis on image 37 series 3, formerly the same. Right eccentric subcarinal adenopathy 1.4 cm in short axis on image 47 series 3, previously 1.8 cm. Lungs/Pleura: Severe emphysema. 1.9 by 1.4 cm spiculated right lower lobe pulmonary nodule on image 95 series 5, very ill-defined margins, previously about 1.8 by 1.4 cm on 05/31/2022. Interstitial accentuation and hazy ground-glass opacity in the right lung and aerated portion of the left lung. Drowned lung appearance of the left lower lobe with complete consolidation and some volume loss in the left lower lobe. There is likewise passive atelectasis of much of the left upper lobe. The cavitary  masses in the left upper lobe and left lower lobe are somewhat obscured, but hypodensity suggesting underlying mass measuring 6.6 by 5.6 cm in the left lower lobe observed on image 65 series 3. A left upper lobe masslike process peripherally measures 5.5 by 3.5 cm on image 50 series 3. Both are roughly similar to the 05/31/2022 exam. The large left pleural effusion is associated with some mild rightward shift of cardiac and mediastinal structures. Malignant effusion is a distinct possibility. Musculoskeletal: Lytic and primarily sclerotic lesion of the right anterior sixth rib noted on image 124 series 5, previously highly hypermetabolic on PET-CT, overall increased sclerosis and lucency in this  vicinity from 05/31/2022, compatible with progressive osseous metastatic disease. Sclerotic osseous lesion in the right anterior eighth rib compatible with malignancy, increase conspicuity. Multiple sclerotic osseous metastatic lesions in the thoracic spine including the T5, T6, T10, and T12 levels compatible with malignancy, increase conspicuity from 05/31/2022. Review of the MIP images confirms the above findings. CT ABDOMEN and PELVIS FINDINGS Hepatobiliary: Unremarkable Pancreas: Unremarkable Spleen: Unremarkable Adrenals/Urinary Tract: Unremarkable Stomach/Bowel: Prominent stool throughout the colon favors constipation. Prominence of stool in the rectal vault favoring fecal impaction. No compelling evidence of stercoral colitis. Vascular/Lymphatic: Atherosclerosis is present, including aortoiliac atherosclerotic disease. No pathologic intra-abdominal adenopathy. Reproductive: Unremarkable Other: Minimal hazy stranding in the omentum and mesentery likely reflecting faint third spacing of fluid. Musculoskeletal: Soft tissue mass involving the left anterior rectus abdominus and adjacent subcutaneous tissues extending to the cutaneous surface, measuring 2.3 by 2.3 cm on image 24 series 3, previously 3.0 by 3.4 cm on  05/31/2022, and previously hypermetabolic prior PET-CT. Dominant sclerotic metastatic lesion in the L4 vertebral body measures 2.2 by 2.0 cm on image 57 series 7. This lesion was previously about 0.8 cm in diameter on 03/24/2022. New sclerotic lesions in the left iliac bone include a 1.1 by 0.8 cm lesion on image 59 of series 3 and a 0.7 cm lesion posteromedially on image 56 series 3. Review of the MIP images confirms the above findings. IMPRESSION: 1. No current filling defect in the pulmonary arterial tree to suggest active pulmonary embolus. 2. Drowned lung appearance of the left lower lobe with complete consolidation and some volume loss in the left lower lobe, and passive atelectasis of much of the left upper lobe. The cavitary masses in the left upper lobe and left lower lobe are roughly similar to 05/31/2022. 3. Mild hazy interstitial accentuation in both lungs along with faint ground-glass opacities, potentially from edema or atypical pneumonia. 4. Large left pleural effusion is associated with some mild rightward shift of cardiac and mediastinal structures. Malignant effusion is a distinct possibility. 5. Increased conspicuity and size of scattered osseous metastatic lesions. 6. The previously hypermetabolic left anterior abdominal wall mass has reduced in size compared to 05/31/2022. 7. Stable spiculated right lower lobe pulmonary nodule (previously hypermetabolic). 8. Prominent stool throughout the colon favors constipation. Prominence of stool in the rectal vault favoring fecal impaction. No compelling evidence of stercoral colitis. 9. Aortic atherosclerosis. Aortic Atherosclerosis (ICD10-I70.0) and Emphysema (ICD10-J43.9). Electronically Signed   By: Van Clines M.D.   On: 08/31/2022 14:33   DG Chest Port 1 View  Result Date: 08/31/2022 CLINICAL DATA:  Lung cancer with ongoing chemotherapy and radiation therapy, breathing difficulty, questionable sepsis EXAM: PORTABLE CHEST 1 VIEW COMPARISON:   Portable exam 1102 hours compared to 06/01/2022 FINDINGS: Upper normal size of cardiac silhouette. Mediastinal contours and pulmonary vascularity normal. Chronic interstitial infiltrates. Persistent opacities in the LEFT mid lung and LEFT lower lobe with increased LEFT pleural effusion and LEFT lung volume loss since previous study. No pneumothorax or acute osseous findings. IMPRESSION: Chronic interstitial lung disease. Persistent LEFT lung opacities in LEFT upper and LEFT lower lobes with increased LEFT pleural effusion and basilar atelectasis since prior study. Electronically Signed   By: Lavonia Dana M.D.   On: 08/31/2022 11:10    EKG: Independently reviewed. See above   Assessment/Plan  Pleural effusion with acute on chronic respiratory failure  -in background of stage IV non-small cell lung cancer  -CT scan noting large left pleural effusion  associated with  mild rightward shift of  cardiac and mediastinal structures. Suggestive of Malignant effusion . -admit to progressive care  -IR consult for thoracentesis - NPO for IR procedure in am  - supportive care with O2  - hold Eliquis   CAP vs Post obstructive PNA  with associated sepsis -continue with broad spectrum abx  -pulmonary toilet  -urine ag, sputum, f/u on culture data     Possible new onset CHF -past echo noted  RV strain -BNP in 400's up from prior -ct with ? Area fluid vs infection  - will f/u with echo  -patient bp initially low ,now 1 teens, holding anti-htn medications and lasix currently as patient respiratory status current stable  with supplemental O2   Lactic acidosis -multifactorial,infection / hypoxemia  -improved s/p ivfs  -monitor labs  -if lactic trends up consider small bolus dosing due concern for element of heart dysfunction   Stage IV non-small cell lung cancer  -dx 10/23 - followed by Dr  Julien Nordmann -s/p palliative radiation,s/p one cycle of palliative chemo, -currently of treatment due not feeling  well enough to restart sessions -Oncology consult for further assistance   DVT/PE -dx 11/23 -discharged on home O2 of 2L at that time  - on Eliquis on hold for procedure in am  -resume Eliquis s/p procedure based on IR recs  Afib  -rate controlled  -on eliquis  - holding  bb due to lower bp  -resume as able  HTN -hypotensive on admit  -hold bp medications currently    COPD -no exacerbation  -prn nebs  -not on controller medication   Anxiety  -resume xanax    HLD  -continue statin    DVT prophylaxis: Eliquis to be restarted s/p procedure based on IR rec Code Status: full/ as discussed per patient wishes in event of cardiac arrest  Family Communication: Easter,Betty (Sister) [phone] (Mobile)  Disposition Plan: patient  expected to be admitted greater than 2 midnights  Consults called: IR Admission status: progressive care   Clance Boll MD [facility]   If 7PM-7AM, please contact night-coverage [email or web address] Password [email or web address]  08/31/2022, 4:20 PM

## 2022-08-31 NOTE — ED Notes (Signed)
Pt back from CT

## 2022-08-31 NOTE — ED Notes (Signed)
ED TO INPATIENT HANDOFF REPORT  ED Nurse Name and Phone #: Renette Butters Name/Age/Gender Roger [name] Roger 67 y.o. male Room/Bed: WA06/WA06  Code Status   Code Status: Prior  Home/SNF/Other Home Patient oriented to: self, place, time, and situation Is this baseline? Yes   Triage Complete: Triage complete  Chief Complaint Pleural effusion [J90]  Triage Note Patient brought to the ER for evaluation of "breathing issues." Pt currently during chemo and radiation. Family with patient states his sister called and stated to come to the ER before going for his scan due to breathing issues. Pt has lung cancer.   Allergies Allergies  Allergen Reactions   Demerol Palpitations and Other (See Comments)    Heart beats fast   Erythromycin Nausea And Vomiting   Prednisone Other (See Comments)    "felt weird"   Sulfa Antibiotics Nausea And Vomiting and Other (See Comments)    hematoemesis   Augmentin [Amoxicillin-Pot Clavulanate] Nausea And Vomiting    Level of Care/Admitting Diagnosis ED Disposition     ED Disposition  Admit   Condition  --   [place]: [place] [[id number]]  Level of Care: Progressive [102]  Admit to Progressive based on following criteria: RESPIRATORY PROBLEMS hypoxemic/hypercapnic respiratory failure that is responsive to NIPPV (BiPAP) or High Flow Nasal Cannula (6-80 lpm). Frequent assessment/intervention, no > Q2 hrs < Q4 hrs, to maintain oxygenation and pulmonary hygiene.  May admit patient to [facility] or [facility] if equivalent level of care is available:: No  Covid Evaluation: Symptomatic Person Under Investigation (PUI) or recent exposure (last 10 days) *Testing Required*  Diagnosis: Pleural effusion [242230]  Admitting Physician: Clance Boll [id number]  Attending Physician: Clance Boll [id number]  Certification:: I certify this patient will need inpatient services for at least 2 midnights  Estimated Length of  Stay: 3          B Medical/Surgery History Past Medical History:  Diagnosis Date   Anxiety    Asthma    COPD (chronic obstructive pulmonary disease) ([place])    Depression    Hemorrhoids    History of radiation therapy    Abdomen- 06/08/22-06/30/22- Dr. Gery Pray   History of rectal bleeding    Hypertension    Palpitations    Pneumonia    in 2015   Post-operative nausea and vomiting    pt unsure   Primary squamous cell carcinoma of left lung ([place]) 05/03/2022   Past Surgical History:  Procedure Laterality Date   DENTAL SURGERY     MASS EXCISION Right 04/20/2022   Procedure: NEEDLE BIOPSY OF CHEST WALL MASS RIGHT LOWER CHEST WALL;  Surgeon: [name], MD;  Location: [facility];  Service: Thoracic;  Laterality: Right;   NOSE SURGERY     SKIN CANCER EXCISION     left forehead   TONSILLECTOMY     VIDEO BRONCHOSCOPY WITH ENDOBRONCHIAL NAVIGATION N/A 04/20/2022   Procedure: VIDEO BRONCHOSCOPY WITH ENDOBRONCHIAL NAVIGATION;  Surgeon: [name], MD;  Location: [facility];  Service: Thoracic;  Laterality: N/A;     A IV Location/Drains/Wounds Patient Lines/Drains/Airways Status     Active Line/Drains/Airways     Name Placement date Placement time Site Days   Peripheral IV 08/31/22 20 G Left Antecubital 08/31/22  1157  Antecubital  less than 1            Intake/Output Last 24 hours  Intake/Output Summary (Last 24 hours) at 08/31/2022 1605 Last data filed  at 08/31/2022 1553 Gross per 24 hour  Intake 2300 ml  Output --  Net 2300 ml    Labs/Imaging Results for orders placed or performed during the hospital encounter of 08/31/22 (from the past 48 hour(s))  Lactic acid, plasma     Status: Abnormal   Collection Time: 08/31/22 11:46 AM  Result Value Ref Range   Lactic Acid, Venous 4.7 (HH) 0.5 - 1.9 mmol/L    Comment: CRITICAL RESULT CALLED TO, READ BACK BY AND VERIFIED WITH CORTES,I. RN AT [id number] 08/31/22 MULLINS,T Performed at [facility], [place] [address] [place], [place] [address]   Comprehensive metabolic panel     Status: Abnormal   Collection Time: 08/31/22 11:46 AM  Result Value Ref Range   Sodium 135 135 - 145 mmol/L   Potassium 3.8 3.5 - 5.1 mmol/L   Chloride 101 98 - 111 mmol/L   CO2 24 22 - 32 mmol/L   Glucose, Bld 185 (H) 70 - 99 mg/dL    Comment: Glucose reference range applies only to samples taken after fasting for at least 8 hours.   BUN 17 8 - 23 mg/dL   Creatinine, Ser 0.93 0.61 - 1.24 mg/dL   Calcium 10.9 (H) 8.9 - 10.3 mg/dL   Total Protein 7.6 6.5 - 8.1 g/dL   Albumin 2.8 (L) 3.5 - 5.0 g/dL   AST 22 15 - 41 U/L   ALT 14 0 - 44 U/L   Alkaline Phosphatase 129 (H) 38 - 126 U/L   Total Bilirubin 1.0 0.3 - 1.2 mg/dL   GFR, Estimated >60 >60 mL/min    Comment: (NOTE) Calculated using the CKD-EPI Creatinine Equation (2021)    Anion gap 10 5 - 15    Comment: Performed at [facility], [place] [address] [place], [place] [address]  CBC with Differential     Status: Abnormal   Collection Time: 08/31/22 11:46 AM  Result Value Ref Range   WBC 13.0 (H) 4.0 - 10.5 K/uL   RBC 3.93 (L) 4.22 - 5.81 MIL/uL   Hemoglobin 11.1 (L) 13.0 - 17.0 g/dL   HCT 35.9 (L) 39.0 - 52.0 %   MCV 91.3 80.0 - 100.0 fL   MCH 28.2 26.0 - 34.0 pg   MCHC 30.9 30.0 - 36.0 g/dL   RDW 15.6 (H) 11.5 - 15.5 %   Platelets 346 150 - 400 K/uL   nRBC 0.0 0.0 - 0.2 %   Neutrophils Relative % 91 %   Neutro Abs 11.7 (H) 1.7 - 7.7 K/uL   Lymphocytes Relative 4 %   Lymphs Abs 0.5 (L) 0.7 - 4.0 K/uL   Monocytes Relative 4 %   Monocytes Absolute 0.6 0.1 - 1.0 K/uL   Eosinophils Relative 0 %   Eosinophils Absolute 0.0 0.0 - 0.5 K/uL   Basophils Relative 0 %   Basophils Absolute 0.0 0.0 - 0.1 K/uL   Immature Granulocytes 1 %   Abs Immature Granulocytes 0.06 0.00 - 0.07 K/uL    Comment: Performed at [facility], [place] [address] [place], [place] [address]  Protime-INR     Status: Abnormal    Collection Time: 08/31/22 11:46 AM  Result Value Ref Range   Prothrombin Time 23.0 (H) 11.4 - 15.2 seconds   INR 2.1 (H) 0.8 - 1.2    Comment: (NOTE) INR goal varies based on device and disease states. Performed at [facility], [place] [address], [place], [place] [address]  APTT     Status: Abnormal   Collection Time: 08/31/22 11:46 AM  Result Value Ref Range   aPTT 42 (H) 24 - 36 seconds    Comment:        IF BASELINE aPTT IS ELEVATED, SUGGEST PATIENT RISK ASSESSMENT BE USED TO DETERMINE APPROPRIATE ANTICOAGULANT THERAPY. Performed at [facility], [place] [address], [place], [place] [address]   Brain natriuretic peptide     Status: Abnormal   Collection Time: 08/31/22 11:46 AM  Result Value Ref Range   B Natriuretic Peptide 455.8 (H) 0.0 - 100.0 pg/mL    Comment: Performed at [facility], [place] [address], [place], [place] [address]  Blood gas, venous ([facility], [facility], [facility])     Status: Abnormal   Collection Time: 08/31/22 11:46 AM  Result Value Ref Range   pH, Ven 7.39 7.25 - 7.43   pCO2, Ven 44 44 - 60 mmHg   pO2, Ven <31 (LL) 32 - 45 mmHg    Comment: CRITICAL RESULT CALLED TO, READ BACK BY AND VERIFIED WITH: BLAIR, I. RN AT 1220 ON 08/31/2022 BY MECIAL J.    Bicarbonate 26.6 20.0 - 28.0 mmol/L   Acid-Base Excess 1.2 0.0 - 2.0 mmol/L   O2 Saturation 19.9 %   Patient temperature 37.0     Comment: Performed at [facility], [place] [address] [place], [place] [address]  Urinalysis, w/ Reflex to Culture (Infection Suspected) -Urine, Clean Catch     Status: Abnormal   Collection Time: 08/31/22 12:36 PM  Result Value Ref Range   Specimen Source URINE, CLEAN CATCH    Color, Urine YELLOW YELLOW   APPearance HAZY (A) CLEAR   Specific Gravity, Urine 1.019 1.005 - 1.030   pH 5.0 5.0 - 8.0   Glucose, UA NEGATIVE NEGATIVE mg/dL   Hgb urine dipstick NEGATIVE NEGATIVE   Bilirubin Urine NEGATIVE NEGATIVE   Ketones, ur  NEGATIVE NEGATIVE mg/dL   Protein, ur NEGATIVE NEGATIVE mg/dL   Nitrite NEGATIVE NEGATIVE   Leukocytes,Ua NEGATIVE NEGATIVE   RBC / HPF 6-10 0 - 5 RBC/hpf   WBC, UA 0-5 0 - 5 WBC/hpf    Comment:        Reflex urine culture not performed if WBC <=10, OR if Squamous epithelial cells >5. If Squamous epithelial cells >5 suggest recollection.    Bacteria, UA NONE SEEN NONE SEEN   Squamous Epithelial / HPF 0-5 0 - 5 /HPF   Mucus PRESENT    Hyaline Casts, UA PRESENT    Ca Oxalate Crys, UA PRESENT     Comment: Performed at [facility], [place] [address] [place], [place] [address]  Lactic acid, plasma     Status: Abnormal   Collection Time: 08/31/22  2:12 PM  Result Value Ref Range   Lactic Acid, Venous 2.7 (HH) 0.5 - 1.9 mmol/L    Comment: CRITICAL VALUE NOTED. VALUE IS CONSISTENT WITH PREVIOUSLY REPORTED/CALLED VALUE Performed at [place] [address], [place], [place] [address]    CT Angio Chest PE W and/or Wo Contrast  Result Date: 08/31/2022 CLINICAL DATA:  Stage IV bronchogenic carcinoma. Acute pulmonary embolus in November 2023. Current chemotherapy and radiation therapy. Difficulty breathing and possible sepsis. * Tracking Code: BO * EXAM: CT ANGIOGRAPHY CHEST CT ABDOMEN AND PELVIS WITH CONTRAST TECHNIQUE: Multidetector CT imaging of the chest was performed using the standard protocol during bolus administration of intravenous contrast. Multiplanar CT image reconstructions and MIPs were  obtained to evaluate the vascular anatomy. Multidetector CT imaging of the abdomen and pelvis was performed using the standard protocol during bolus administration of intravenous contrast. RADIATION DOSE REDUCTION: This exam was performed according to the departmental dose-optimization program which includes automated exposure control, adjustment of the mA and/or kV according to patient size and/or use of iterative reconstruction technique. CONTRAST:  129m  OMNIPAQUE IOHEXOL 350 MG/ML SOLN COMPARISON:  Multiple exams, including CTA chest 05/31/2022 and PET-CT 03/24/2022 FINDINGS: CTA CHEST FINDINGS Cardiovascular: No current filling defect in the pulmonary arterial tree to suggest active pulmonary embolus. Coronary, aortic arch, and branch vessel atherosclerotic vascular disease. Mediastinum/Nodes: Right paratracheal node 0.9 cm in short axis on image 37 series 3, formerly the same. Right eccentric subcarinal adenopathy 1.4 cm in short axis on image 47 series 3, previously 1.8 cm. Lungs/Pleura: Severe emphysema. 1.9 by 1.4 cm spiculated right lower lobe pulmonary nodule on image 95 series 5, very ill-defined margins, previously about 1.8 by 1.4 cm on 05/31/2022. Interstitial accentuation and hazy ground-glass opacity in the right lung and aerated portion of the left lung. Drowned lung appearance of the left lower lobe with complete consolidation and some volume loss in the left lower lobe. There is likewise passive atelectasis of much of the left upper lobe. The cavitary masses in the left upper lobe and left lower lobe are somewhat obscured, but hypodensity suggesting underlying mass measuring 6.6 by 5.6 cm in the left lower lobe observed on image 65 series 3. A left upper lobe masslike process peripherally measures 5.5 by 3.5 cm on image 50 series 3. Both are roughly similar to the 05/31/2022 exam. The large left pleural effusion is associated with some mild rightward shift of cardiac and mediastinal structures. Malignant effusion is a distinct possibility. Musculoskeletal: Lytic and primarily sclerotic lesion of the right anterior sixth rib noted on image 124 series 5, previously highly hypermetabolic on PET-CT, overall increased sclerosis and lucency in this vicinity from 05/31/2022, compatible with progressive osseous metastatic disease. Sclerotic osseous lesion in the right anterior eighth rib compatible with malignancy, increase conspicuity. Multiple sclerotic  osseous metastatic lesions in the thoracic spine including the T5, T6, T10, and T12 levels compatible with malignancy, increase conspicuity from 05/31/2022. Review of the MIP images confirms the above findings. CT ABDOMEN and PELVIS FINDINGS Hepatobiliary: Unremarkable Pancreas: Unremarkable Spleen: Unremarkable Adrenals/Urinary Tract: Unremarkable Stomach/Bowel: Prominent stool throughout the colon favors constipation. Prominence of stool in the rectal vault favoring fecal impaction. No compelling evidence of stercoral colitis. Vascular/Lymphatic: Atherosclerosis is present, including aortoiliac atherosclerotic disease. No pathologic intra-abdominal adenopathy. Reproductive: Unremarkable Other: Minimal hazy stranding in the omentum and mesentery likely reflecting faint third spacing of fluid. Musculoskeletal: Soft tissue mass involving the left anterior rectus abdominus and adjacent subcutaneous tissues extending to the cutaneous surface, measuring 2.3 by 2.3 cm on image 24 series 3, previously 3.0 by 3.4 cm on 05/31/2022, and previously hypermetabolic prior PET-CT. Dominant sclerotic metastatic lesion in the L4 vertebral body measures 2.2 by 2.0 cm on image 57 series 7. This lesion was previously about 0.8 cm in diameter on 03/24/2022. New sclerotic lesions in the left iliac bone include a 1.1 by 0.8 cm lesion on image 59 of series 3 and a 0.7 cm lesion posteromedially on image 56 series 3. Review of the MIP images confirms the above findings. IMPRESSION: 1. No current filling defect in the pulmonary arterial tree to suggest active pulmonary embolus. 2. Drowned lung appearance of the left lower lobe with complete consolidation and  some volume loss in the left lower lobe, and passive atelectasis of much of the left upper lobe. The cavitary masses in the left upper lobe and left lower lobe are roughly similar to 05/31/2022. 3. Mild hazy interstitial accentuation in both lungs along with faint ground-glass opacities,  potentially from edema or atypical pneumonia. 4. Large left pleural effusion is associated with some mild rightward shift of cardiac and mediastinal structures. Malignant effusion is a distinct possibility. 5. Increased conspicuity and size of scattered osseous metastatic lesions. 6. The previously hypermetabolic left anterior abdominal wall mass has reduced in size compared to 05/31/2022. 7. Stable spiculated right lower lobe pulmonary nodule (previously hypermetabolic). 8. Prominent stool throughout the colon favors constipation. Prominence of stool in the rectal vault favoring fecal impaction. No compelling evidence of stercoral colitis. 9. Aortic atherosclerosis. Aortic Atherosclerosis (ICD10-I70.0) and Emphysema (ICD10-J43.9). Electronically Signed   By: Van Clines M.D.   On: 08/31/2022 14:33   CT ABDOMEN PELVIS W CONTRAST  Result Date: 08/31/2022 CLINICAL DATA:  Stage IV bronchogenic carcinoma. Acute pulmonary embolus in November 2023. Current chemotherapy and radiation therapy. Difficulty breathing and possible sepsis. * Tracking Code: BO * EXAM: CT ANGIOGRAPHY CHEST CT ABDOMEN AND PELVIS WITH CONTRAST TECHNIQUE: Multidetector CT imaging of the chest was performed using the standard protocol during bolus administration of intravenous contrast. Multiplanar CT image reconstructions and MIPs were obtained to evaluate the vascular anatomy. Multidetector CT imaging of the abdomen and pelvis was performed using the standard protocol during bolus administration of intravenous contrast. RADIATION DOSE REDUCTION: This exam was performed according to the departmental dose-optimization program which includes automated exposure control, adjustment of the mA and/or kV according to patient size and/or use of iterative reconstruction technique. CONTRAST:  179m OMNIPAQUE IOHEXOL 350 MG/ML SOLN COMPARISON:  Multiple exams, including CTA chest 05/31/2022 and PET-CT 03/24/2022 FINDINGS: CTA CHEST FINDINGS  Cardiovascular: No current filling defect in the pulmonary arterial tree to suggest active pulmonary embolus. Coronary, aortic arch, and branch vessel atherosclerotic vascular disease. Mediastinum/Nodes: Right paratracheal node 0.9 cm in short axis on image 37 series 3, formerly the same. Right eccentric subcarinal adenopathy 1.4 cm in short axis on image 47 series 3, previously 1.8 cm. Lungs/Pleura: Severe emphysema. 1.9 by 1.4 cm spiculated right lower lobe pulmonary nodule on image 95 series 5, very ill-defined margins, previously about 1.8 by 1.4 cm on 05/31/2022. Interstitial accentuation and hazy ground-glass opacity in the right lung and aerated portion of the left lung. Drowned lung appearance of the left lower lobe with complete consolidation and some volume loss in the left lower lobe. There is likewise passive atelectasis of much of the left upper lobe. The cavitary masses in the left upper lobe and left lower lobe are somewhat obscured, but hypodensity suggesting underlying mass measuring 6.6 by 5.6 cm in the left lower lobe observed on image 65 series 3. A left upper lobe masslike process peripherally measures 5.5 by 3.5 cm on image 50 series 3. Both are roughly similar to the 05/31/2022 exam. The large left pleural effusion is associated with some mild rightward shift of cardiac and mediastinal structures. Malignant effusion is a distinct possibility. Musculoskeletal: Lytic and primarily sclerotic lesion of the right anterior sixth rib noted on image 124 series 5, previously highly hypermetabolic on PET-CT, overall increased sclerosis and lucency in this vicinity from 05/31/2022, compatible with progressive osseous metastatic disease. Sclerotic osseous lesion in the right anterior eighth rib compatible with malignancy, increase conspicuity. Multiple sclerotic osseous metastatic lesions in the  thoracic spine including the T5, T6, T10, and T12 levels compatible with malignancy, increase conspicuity from  05/31/2022. Review of the MIP images confirms the above findings. CT ABDOMEN and PELVIS FINDINGS Hepatobiliary: Unremarkable Pancreas: Unremarkable Spleen: Unremarkable Adrenals/Urinary Tract: Unremarkable Stomach/Bowel: Prominent stool throughout the colon favors constipation. Prominence of stool in the rectal vault favoring fecal impaction. No compelling evidence of stercoral colitis. Vascular/Lymphatic: Atherosclerosis is present, including aortoiliac atherosclerotic disease. No pathologic intra-abdominal adenopathy. Reproductive: Unremarkable Other: Minimal hazy stranding in the omentum and mesentery likely reflecting faint third spacing of fluid. Musculoskeletal: Soft tissue mass involving the left anterior rectus abdominus and adjacent subcutaneous tissues extending to the cutaneous surface, measuring 2.3 by 2.3 cm on image 24 series 3, previously 3.0 by 3.4 cm on 05/31/2022, and previously hypermetabolic prior PET-CT. Dominant sclerotic metastatic lesion in the L4 vertebral body measures 2.2 by 2.0 cm on image 57 series 7. This lesion was previously about 0.8 cm in diameter on 03/24/2022. New sclerotic lesions in the left iliac bone include a 1.1 by 0.8 cm lesion on image 59 of series 3 and a 0.7 cm lesion posteromedially on image 56 series 3. Review of the MIP images confirms the above findings. IMPRESSION: 1. No current filling defect in the pulmonary arterial tree to suggest active pulmonary embolus. 2. Drowned lung appearance of the left lower lobe with complete consolidation and some volume loss in the left lower lobe, and passive atelectasis of much of the left upper lobe. The cavitary masses in the left upper lobe and left lower lobe are roughly similar to 05/31/2022. 3. Mild hazy interstitial accentuation in both lungs along with faint ground-glass opacities, potentially from edema or atypical pneumonia. 4. Large left pleural effusion is associated with some mild rightward shift of cardiac and  mediastinal structures. Malignant effusion is a distinct possibility. 5. Increased conspicuity and size of scattered osseous metastatic lesions. 6. The previously hypermetabolic left anterior abdominal wall mass has reduced in size compared to 05/31/2022. 7. Stable spiculated right lower lobe pulmonary nodule (previously hypermetabolic). 8. Prominent stool throughout the colon favors constipation. Prominence of stool in the rectal vault favoring fecal impaction. No compelling evidence of stercoral colitis. 9. Aortic atherosclerosis. Aortic Atherosclerosis (ICD10-I70.0) and Emphysema (ICD10-J43.9). Electronically Signed   By: Van Clines M.D.   On: 08/31/2022 14:33   DG Chest Port 1 View  Result Date: 08/31/2022 CLINICAL DATA:  Lung cancer with ongoing chemotherapy and radiation therapy, breathing difficulty, questionable sepsis EXAM: PORTABLE CHEST 1 VIEW COMPARISON:  Portable exam 1102 hours compared to 06/01/2022 FINDINGS: Upper normal size of cardiac silhouette. Mediastinal contours and pulmonary vascularity normal. Chronic interstitial infiltrates. Persistent opacities in the LEFT mid lung and LEFT lower lobe with increased LEFT pleural effusion and LEFT lung volume loss since previous study. No pneumothorax or acute osseous findings. IMPRESSION: Chronic interstitial lung disease. Persistent LEFT lung opacities in LEFT upper and LEFT lower lobes with increased LEFT pleural effusion and basilar atelectasis since prior study. Electronically Signed   By: Lavonia Dana M.D.   On: 08/31/2022 11:10    Pending Labs Unresulted Labs (From admission, onward)     Start     Ordered   08/31/22 1055  Blood Culture (routine x 2)  (Undifferentiated presentation (screening labs and basic nursing orders))  BLOOD CULTURE X 2,   STAT      08/31/22 1055            Vitals/Pain Today's Vitals   08/31/22 1500 08/31/22 1515 08/31/22 1530  08/31/22 1545  BP: 115/84 108/86 114/82 110/77  Pulse: 83 60 94 84   Resp: (!) 21 19 (!) 23 (!) 26  Temp:      TempSrc:      SpO2: 92% 90% 91% 98%  Weight:      Height:      PainSc:        Isolation Precautions No active isolations  Medications Medications  lactated ringers bolus 1,000 mL (0 mLs Intravenous Stopped 08/31/22 1317)  ceFEPIme (MAXIPIME) 2 g in sodium chloride 0.9 % 100 mL IVPB (0 g Intravenous Stopped 08/31/22 1448)  vancomycin (VANCOCIN) IVPB 1000 mg/200 mL premix (0 mg Intravenous Stopped 08/31/22 1553)  sodium chloride (PF) 0.9 % injection (  Given by Other 08/31/22 1357)  iohexol (OMNIPAQUE) 350 MG/ML injection 100 mL (100 mLs Intravenous Contrast Given 08/31/22 1332)  lactated ringers bolus 1,000 mL (0 mLs Intravenous Stopped 08/31/22 1543)    Mobility walks     Focused Assessments     R Recommendations: See Admitting Provider Note  Report given to:   Additional Notes:

## 2022-08-31 NOTE — Progress Notes (Signed)
Pharmacy Antibiotic Note  Roger [name] is a 67 y.o. male with hx PE, COPD and NSCLC who presented to the ED on 08/31/2022 with c/o SOB. Marland Kitchen  Pharmacy has been consulted to dose vancomycin for sepsis.  Today, 08/31/2022: - wbc 13 - ANC 11.7 - scr 0.93 (crcl~67) - LA elevated   Plan: - vancomycin 1000 mg IV x1 given in the ED at 2:46p. Will start vancomycin 1250 mg IV q24h on 09/01/22 with est AUC 481 - cefepime 2gm IV q8h per MD  _______________________________________  Height: '5\' 11"'$  (180.3 cm) Weight: 59.9 kg (132 lb) IBW/kg (Calculated) : 75.3  Temp (24hrs), Avg:97.6 F (36.4 C), Min:97.5 F (36.4 C), Max:97.6 F (36.4 C)  Recent Labs  Lab 08/31/22 1146 08/31/22 1412  WBC 13.0*  --   CREATININE 0.93  --   LATICACIDVEN 4.7* 2.7*    Estimated Creatinine Clearance: 67.1 mL/min (by C-G formula based on SCr of 0.93 mg/dL).    Allergies  Allergen Reactions   Demerol Palpitations and Other (See Comments)    Heart beats fast   Erythromycin Nausea And Vomiting   Prednisone Other (See Comments)    "felt weird"   Sulfa Antibiotics Nausea And Vomiting and Other (See Comments)    hematoemesis   Augmentin [Amoxicillin-Pot Clavulanate] Nausea And Vomiting    Thank you for allowing pharmacy to be a part of this patient's care.  Lynelle Doctor 08/31/2022 5:02 PM

## 2022-08-31 NOTE — Progress Notes (Signed)
A consult was received from an ED physician for vancomycin per pharmacy dosing (for an indication other than meningitis). The patient's profile has been reviewed for ht/wt/allergies/indication/available labs. A one time order has been placed for the above antibiotics.  Further antibiotics/pharmacy consults should be ordered by admitting physician if indicated.                       Reuel Boom, PharmD, BCPS [phone] 08/31/2022, 1:17 PM

## 2022-08-31 NOTE — Plan of Care (Signed)
  Problem: Education: Goal: Knowledge of General Education information will improve Description Including pain rating scale, medication(s)/side effects and non-pharmacologic comfort measures Outcome: Progressing   Problem: Activity: Goal: Risk for activity intolerance will decrease Outcome: Progressing   Problem: Safety: Goal: Ability to remain free from injury will improve Outcome: Progressing   

## 2022-09-01 ENCOUNTER — Inpatient Hospital Stay (HOSPITAL_COMMUNITY): Payer: BC Managed Care – PPO

## 2022-09-01 DIAGNOSIS — E43 Unspecified severe protein-calorie malnutrition: Secondary | ICD-10-CM | POA: Insufficient documentation

## 2022-09-01 DIAGNOSIS — J90 Pleural effusion, not elsewhere classified: Secondary | ICD-10-CM | POA: Diagnosis not present

## 2022-09-01 LAB — PROTIME-INR
INR: 1.9 — ABNORMAL HIGH (ref 0.8–1.2)
Prothrombin Time: 21.2 seconds — ABNORMAL HIGH (ref 11.4–15.2)

## 2022-09-01 LAB — ALBUMIN, PLEURAL OR PERITONEAL FLUID: Albumin, Fluid: 1.7 g/dL

## 2022-09-01 LAB — LACTATE DEHYDROGENASE, PLEURAL OR PERITONEAL FLUID: LD, Fluid: 132 U/L — ABNORMAL HIGH (ref 3–23)

## 2022-09-01 LAB — PROTEIN, PLEURAL OR PERITONEAL FLUID: Total protein, fluid: 4.1 g/dL

## 2022-09-01 LAB — CBC
HCT: 33.8 % — ABNORMAL LOW (ref 39.0–52.0)
Hemoglobin: 10.2 g/dL — ABNORMAL LOW (ref 13.0–17.0)
MCH: 27.9 pg (ref 26.0–34.0)
MCHC: 30.2 g/dL (ref 30.0–36.0)
MCV: 92.3 fL (ref 80.0–100.0)
Platelets: 299 10*3/uL (ref 150–400)
RBC: 3.66 MIL/uL — ABNORMAL LOW (ref 4.22–5.81)
RDW: 15.7 % — ABNORMAL HIGH (ref 11.5–15.5)
WBC: 9.8 10*3/uL (ref 4.0–10.5)
nRBC: 0 % (ref 0.0–0.2)

## 2022-09-01 LAB — GLUCOSE, CAPILLARY
Glucose-Capillary: 113 mg/dL — ABNORMAL HIGH (ref 70–99)
Glucose-Capillary: 105 mg/dL — ABNORMAL HIGH (ref 70–99)
Glucose-Capillary: 82 mg/dL (ref 70–99)
Glucose-Capillary: 139 mg/dL — ABNORMAL HIGH (ref 70–99)
Glucose-Capillary: 145 mg/dL — ABNORMAL HIGH (ref 70–99)

## 2022-09-01 LAB — COMPREHENSIVE METABOLIC PANEL
ALT: 13 U/L (ref 0–44)
AST: 15 U/L (ref 15–41)
Albumin: 2.7 g/dL — ABNORMAL LOW (ref 3.5–5.0)
Alkaline Phosphatase: 116 U/L (ref 38–126)
Anion gap: 7 (ref 5–15)
BUN: 12 mg/dL (ref 8–23)
CO2: 22 mmol/L (ref 22–32)
Calcium: 10.5 mg/dL — ABNORMAL HIGH (ref 8.9–10.3)
Chloride: 103 mmol/L (ref 98–111)
Creatinine, Ser: 0.64 mg/dL (ref 0.61–1.24)
GFR, Estimated: >60 mL/min (ref >60)
Glucose, Bld: 120 mg/dL — ABNORMAL HIGH (ref 70–99)
Potassium: 3 mmol/L — ABNORMAL LOW (ref 3.5–5.1)
Sodium: 132 mmol/L — ABNORMAL LOW (ref 135–145)
Total Bilirubin: 0.6 mg/dL (ref 0.3–1.2)
Total Protein: 7.2 g/dL (ref 6.5–8.1)

## 2022-09-01 LAB — BODY FLUID CELL COUNT WITH DIFFERENTIAL
Eos, Fluid: 3 %
Lymphs, Fluid: 53 %
Monocyte-Macrophage-Serous Fluid: 0 % — ABNORMAL LOW (ref 50–90)
Neutrophil Count, Fluid: 37 % — ABNORMAL HIGH (ref 0–25)
Other Cells, Fluid: 4 %
Total Nucleated Cell Count, Fluid: 733 cu mm (ref 0–1000)

## 2022-09-01 LAB — MRSA NEXT GEN BY PCR, NASAL: MRSA by PCR Next Gen: NOT DETECTED

## 2022-09-01 LAB — PROCALCITONIN: Procalcitonin: 0.17 ng/mL

## 2022-09-01 LAB — LACTIC ACID, PLASMA: Lactic Acid, Venous: 1.8 mmol/L (ref 0.5–1.9)

## 2022-09-01 LAB — STREP PNEUMONIAE URINARY ANTIGEN: Strep Pneumo Urinary Antigen: NEGATIVE

## 2022-09-01 MED ORDER — SENNOSIDES-DOCUSATE SODIUM 8.6-50 MG PO TABS
1.0000 | ORAL_TABLET | Freq: Two times a day (BID) | ORAL | Status: DC
  Administered 2022-09-01 – 2022-09-11 (×16): 1 via ORAL
  Filled 2022-09-01 (×20): qty 1

## 2022-09-01 MED ORDER — APIXABAN 5 MG PO TABS
5.0000 mg | ORAL_TABLET | Freq: Two times a day (BID) | ORAL | Status: DC
  Administered 2022-09-01 – 2022-09-11 (×21): 5 mg via ORAL
  Filled 2022-09-01 (×21): qty 1

## 2022-09-01 MED ORDER — POTASSIUM CHLORIDE 10 MEQ/100ML IV SOLN
10.0000 meq | INTRAVENOUS | Status: AC
  Administered 2022-09-01 (×5): 10 meq via INTRAVENOUS
  Filled 2022-09-01 (×5): qty 100

## 2022-09-01 MED ORDER — ATENOLOL 25 MG PO TABS
12.5000 mg | ORAL_TABLET | Freq: Every day | ORAL | Status: DC
  Administered 2022-09-01 – 2022-09-02 (×2): 12.5 mg via ORAL
  Filled 2022-09-01 (×2): qty 1

## 2022-09-01 MED ORDER — LIDOCAINE HCL 1 % IJ SOLN
INTRAMUSCULAR | Status: AC
  Administered 2022-09-01: 15 mL
  Filled 2022-09-01: qty 20

## 2022-09-01 MED ORDER — ENSURE MAX PROTEIN PO LIQD
11.0000 [oz_av] | Freq: Every day | ORAL | Status: DC
  Administered 2022-09-01 – 2022-09-09 (×5): 11 [oz_av] via ORAL
  Filled 2022-09-01 (×11): qty 330

## 2022-09-01 MED ORDER — SODIUM CHLORIDE 0.9 % IV SOLN
3.0000 g | Freq: Four times a day (QID) | INTRAVENOUS | Status: DC
  Administered 2022-09-01 – 2022-09-03 (×7): 3 g via INTRAVENOUS
  Filled 2022-09-01 (×7): qty 8

## 2022-09-01 MED ORDER — ENSURE ENLIVE PO LIQD
237.0000 mL | Freq: Two times a day (BID) | ORAL | Status: DC
  Administered 2022-09-01 – 2022-09-11 (×16): 237 mL via ORAL

## 2022-09-01 MED ORDER — POTASSIUM CHLORIDE CRYS ER 20 MEQ PO TBCR
40.0000 meq | EXTENDED_RELEASE_TABLET | Freq: Once | ORAL | Status: AC
  Administered 2022-09-01: 40 meq via ORAL
  Filled 2022-09-01: qty 2

## 2022-09-01 MED ORDER — SACCHAROMYCES BOULARDII 250 MG PO CAPS
250.0000 mg | ORAL_CAPSULE | Freq: Two times a day (BID) | ORAL | Status: DC
  Administered 2022-09-01 – 2022-09-11 (×20): 250 mg via ORAL
  Filled 2022-09-01 (×20): qty 1

## 2022-09-01 NOTE — Progress Notes (Signed)
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  Subjective:  He is seen after returned from thoracentesis, he reports some dry cough "every now and then " he denies pain, no sob, no edema, no fever,   He is in afib/rvr currently , he denies feeling heart palpitation, he states "nothing has ever been wrong with my heart"  My home nurse tole me  last week" you have the strongest heart I have ever heard"   He said his oncologist told him he has three more chemotherapy to go, then he will take [place] once every three weeks , his oncologist will try that for two years if it does not work , his oncologist will try something else  Assessment & Plan:  Principal Problem:   Pleural effusion Active Problems:   Protein-calorie malnutrition, severe    Assessment and Plan:   Pleural effusion with acute on chronic respiratory failure , report O2 as needed at home -in background of stage IV non-small cell lung cancer  -CT scan noting large left pleural effusion  associated with  mild rightward shift of cardiac and mediastinal structures. Suggestive of Malignant effusion . --IR consult for thoracentesis, hold Eliquis -I have discussed the possibility of malignant effusion that most often will recur, he might actually require Pleurx placement for symptom relief   CAP vs Post obstructive PNA  with associated sepsis/lactic acidosis -mrsa screening negative,  -Urine strep pneumo negative , does not have much cough , blood culture no growth    - change vanc/cefepime to unasyn -pulmonary toilet , f/u on pleural fluids culture  Lactic acidosis -multifactorial,infection / hypoxemia  -resolved  Afib Per chart review he was diagnosed with A-fib in December 2023 However he report he was never told that  he had heart issues Resume atenolol at lower dose with holding parameters due to soft BP, already on Eliquis   DVT /PE, 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    The patient is status post palliative radiotherapy to the painful abdominal lesion under the care of Dr. Sondra Come.  systemic chemotherapy with carboplatin for AUC of 5, paclitaxel 175 Mg/M2 and Keytruda 200 Mg IV every 3 weeks with Neulasta support status post 1 cycle which was given on May 09, 2022 , appeared that the only cycle he received so far  FTT, poor prognosis, palliative care consulted for goals of care discussion   Nutritional Assessment: The patient's BMI is: Body mass index is 18.05 kg/m.Marland Kitchen Seen by dietician.  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   Skin Assessment: I have examined the patient's skin and  I agree with the wound assessment as performed by the wound care RN as outlined below: Pressure Injury 08/31/22 Sacrum Stage 2 -  Partial thickness loss of dermis presenting as a shallow open injury with a red, pink wound bed without slough. red, open (Active)  08/31/22 1752  Location: Sacrum  Location Orientation:   Staging: Stage 2 -  Partial  thickness loss of dermis presenting as a shallow open injury with a red, pink wound bed without slough.  Wound Description (Comments): red, open  Present on Admission: Yes  Dressing Type Foam - Lift dressing to assess site every shift 09/01/22 0800     I have Reviewed nursing notes, Vitals, pain scores, I/o's, Lab results and  imaging results since pt's last encounter, details please see discussion above  I ordered the following labs:  Unresulted Labs (From admission, onward)     Start     Ordered   09/02/22 0500  CBC with Differential/Platelet  Tomorrow morning,   R        09/01/22 0828   09/02/22 [id number]  Basic metabolic panel  Tomorrow morning,   R        09/01/22 0828   09/02/22 0500  Magnesium  Tomorrow morning,   R        09/01/22 0828   09/02/22 0500  Phosphorus  Tomorrow morning,   R        09/01/22 0828   09/01/22 1222  Lactate dehydrogenase (pleural or peritoneal fluid)  RELEASE UPON ORDERING,   TIMED        09/01/22 1222   09/01/22 1222  Albumin, pleural or peritoneal fluid   RELEASE UPON ORDERING,   TIMED        09/01/22 1222   09/01/22 1222  Protein, pleural or peritoneal fluid  RELEASE UPON ORDERING,   TIMED        09/01/22 1222   09/01/22 1222  Total bilirubin, body fluid  RELEASE UPON ORDERING,   TIMED        09/01/22 1222   08/31/22 2244  Legionella Pneumophila Serogp 1 Ur Ag  Once,   R        08/31/22 2244   08/31/22 2244  Expectorated Sputum Assessment w Gram Stain, Rflx to Resp Cult  Once,   R        08/31/22 2244             DVT prophylaxis:  apixaban (ELIQUIS) tablet 5 mg   Code Status:   Code Status: Full Code  Family Communication: Reported HPOA which his sister in law, he declined my offer to call his family  Disposition:    Dispo: The patient is from: home, lives with his sister              Anticipated d/c is to: home              Anticipated d/c date is: 24-48hrs, pending heart rate control, fluids culture, palliative care  consult  Antimicrobials:    Anti-infectives (From admission, onward)    Start     Dose/Rate Route Frequency Ordered Stop   09/01/22 1000  vancomycin (VANCOREADY) IVPB 1250 mg/250 mL  Status:  Discontinued        1,250 mg 166.7 mL/hr over 90 Minutes Intravenous Every 24 hours 08/31/22 1710 09/01/22 1248   08/31/22 2200  ceFEPIme (MAXIPIME) 2 g in sodium chloride 0.9 % 100 mL IVPB        2 g 200 mL/hr  over 30 Minutes Intravenous Every 8 hours 08/31/22 1652     08/31/22 1330  vancomycin (VANCOCIN) IVPB 1000 mg/200 mL premix        1,000 mg 200 mL/hr over 60 Minutes Intravenous  Once 08/31/22 1316 08/31/22 1553   08/31/22 1315  ceFEPIme (MAXIPIME) 2 g in sodium chloride 0.9 % 100 mL IVPB        2 g 200 mL/hr over 30 Minutes Intravenous  Once 08/31/22 1306 08/31/22 1448          Objective: Vitals:   09/01/22 1030 09/01/22 1042 09/01/22 1122 09/01/22 1317  BP: (!) 111/91 (!) 106/44 108/61 110/71  Pulse:   68 68  Resp:    16  Temp:      TempSrc:      SpO2:    99%  Weight:      Height:        Intake/Output Summary (Last 24 hours) at 09/01/2022 1802 Last data filed at 09/01/2022 1700 Gross per 24 hour  Intake 670 ml  Output 1650 ml  Net -980 ml   Filed Weights   08/31/22 1038 08/31/22 1729 09/01/22 0422  Weight: 59.9 kg 58.9 kg 58.7 kg    Examination:  General exam: AAOx3, thin and frail, chronic ill-appearing Respiratory system: Diminished, no wheezing, no rales, no rhonchi. Respiratory effort normal. Cardiovascular system:  IRRR.  Gastrointestinal system: Abdomen is nondistended, soft and nontender.  Normal bowel sounds heard. Central nervous system: Alert and oriented. No focal neurological deficits. Extremities:  no edema Skin: No rashes, lesions or ulcers Psychiatry: Calm and cooperative, appear having a hard time dealing with cancer diagnosis, report has been always healthy in the past    Data Reviewed: I have personally reviewed  labs and visualized   imaging studies since the last encounter and formulate the plan        Scheduled Meds:  ALPRAZolam  1 mg Oral QID   apixaban  5 mg Oral BID   atenolol  12.5 mg Oral Daily   atorvastatin  10 mg Oral QHS   feeding supplement  237 mL Oral BID BM   insulin aspart  0-6 Units Subcutaneous Q4H   mirtazapine  15 mg Oral QHS   Ensure Max Protein  11 oz Oral Daily   saccharomyces boulardii  250 mg Oral BID   senna-docusate  1 tablet Oral BID   Continuous Infusions:  ceFEPime (MAXIPIME) IV 2 g (09/01/22 1257)     LOS: 1 day   Time spent:  25mns  FFlorencia Reasons MD PhD FACP [facility]  Available via Epic secure chat 7am-7pm for nonurgent issues Please page for urgent issues To page the attending provider between 7A-7P or the covering provider during after hours 7P-7A, please log into the web site [email or web address] and access using universal [place] password for that web site. If you do not have the password, please call the hospital operator.    09/01/2022, 6:02 PM

## 2022-09-01 NOTE — Progress Notes (Signed)
Pharmacy Antibiotic Note  Roger [name] is a 67 y.o. male with hx PE, COPD and NSCLC who presented to the ED on 08/31/2022 with c/o SOB. Marland Kitchen  Pharmacy has been consulted to dose Unasyn for CAP vs post obstructive pneumonia.  Patient initially started on vancomycin and cefepime, now changing to Unasyn.  Plan: -Unasyn 3 g IV q6h   Height: '5\' 11"'$  (180.3 cm) Weight: 58.7 kg (129 lb 6.6 oz) IBW/kg (Calculated) : 75.3  Temp (24hrs), Avg:97.7 F (36.5 C), Min:97.4 F (36.3 C), Max:97.9 F (36.6 C)  Recent Labs  Lab 08/31/22 1146 08/31/22 1412 08/31/22 2312 09/01/22 0157  WBC 13.0*  --   --  9.8  CREATININE 0.93  --   --  0.64  LATICACIDVEN 4.7* 2.7* 1.7 1.8     Estimated Creatinine Clearance: 76.4 mL/min (by C-G formula based on SCr of 0.64 mg/dL).    Allergies  Allergen Reactions   Erythromycin Nausea And Vomiting and Other (See Comments)    GI Intolerance   Prednisone Other (See Comments)    "felt weird"   Sulfa Antibiotics Nausea And Vomiting and Other (See Comments)    Hematemesis (vomiting blood)     Amoxicillin-Pot Clavulanate Nausea And Vomiting and Other (See Comments)    GI Intolerance   Meperidine And Related Palpitations and Other (See Comments)    "Heart beats fast"    Thank you for allowing pharmacy to be a part of this patient's care.   Tawnya Crook, PharmD, BCPS Clinical Pharmacist 09/01/2022 6:21 PM

## 2022-09-01 NOTE — Procedures (Signed)
PROCEDURE SUMMARY:  Successful image-guided left thoracentesis. Yielded 1.0L of amber fluid. Pt tolerated procedure well. No immediate complications. EBL = trace   Specimen was not sent for labs. CXR ordered.  Please see imaging section of Epic for full dictation.  Lura Em PA-C 09/01/2022 10:46 AM

## 2022-09-01 NOTE — Progress Notes (Signed)
Initial Nutrition Assessment  DOCUMENTATION CODES:   Severe malnutrition in context of chronic illness  INTERVENTION:  - Carb Modified diet per MD.  - Ensure Plus High Protein po BID, each supplement provides 350 kcal and 20 grams of protein. - Ensure Max po once dialy, each supplement provides 150 kcal and 30 grams of protein.  - Encourage intake at all meals of protein rich food sources in addition to supplement consumption.  - Continue Remeron as medically appropriate, can act as an appetite stimulant. - Monitor weight trends.    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  GOAL:   Patient will meet greater than or equal to 90% of their needs  MONITOR:   PO intake, Supplement acceptance, Weight trends  REASON FOR ASSESSMENT:   Consult Assessment of nutrition requirement/status  ASSESSMENT:   67 y.o. 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 Found to have pleural effusion with acute on chronic respiratory failure.   Patient finishing lunch at time of visit. Had spaghetti and a cookie.  Patient reports a UBW of 175-180# and weight loss since starting chemotherapy, which made food taste bad.  Per EMR, patient weighed at 170# in August and has since continued to drop to current weight of 129#. This is a 41# or 24% weight loss in 7 months, which is significant and severe for the time frame.   He endorses not being able to eat full meals the past few weeks. Mostly snacking on foods like cereal (cheerios, corn flakes). Has been drinking Premier Protein or having Protein Plus, but often only once a day.  Main hinderance to intake is that food doesn't taste right or good.   Patient desires to gain weight and  strength back. Ate well at lunch. He is agreeable to receive nutrition supplements, likes Ensure Max more than Ensure Plus but agreeable to receive both for additional calories and protein. Encouraged patient to order 3 meals a day and consume supplements to support meeting increased nutrient needs.   Medications reviewed and include: Insulin, Remeron, Vancomycin  Labs reviewed:  Na 132 K+ 3.0 HA1C 8.0   NUTRITION - FOCUSED PHYSICAL EXAM:  Flowsheet Row Most Recent Value  Orbital Region Severe depletion  Upper Arm Region Severe depletion  Thoracic and Lumbar Region Severe depletion  Buccal Region Severe depletion  Temple Region Severe depletion  Clavicle Bone Region Severe depletion  Clavicle and Acromion Bone Region Severe depletion  Scapular Bone Region Unable to assess  Dorsal Hand Moderate depletion  Patellar Region Severe depletion  Anterior Thigh Region Severe depletion  Posterior Calf Region Severe depletion  Edema (RD Assessment) None  Hair Reviewed  Eyes Reviewed  Mouth Reviewed  Skin Reviewed  [dry]  Nails Reviewed       Diet Order:   Diet Order             Diet Carb Modified Fluid consistency: Thin; Room service appropriate? Yes  Diet effective now                   EDUCATION NEEDS:  Education needs have been addressed  Skin:  Skin Assessment: Skin Integrity Issues: Skin Integrity Issues:: Stage II Stage II: Sacrum  Last BM:  2/27  Height:  Ht Readings from Last 1 Encounters:  08/31/22 '5\' 11"'$  (1.803 m)   Weight:  Wt Readings from Last 1 Encounters:  09/01/22 58.7 kg    BMI:  Body mass index is 18.05 kg/m.  Estimated Nutritional Needs:  Kcal:  2000-2300 kcals Protein:  90-115 grams Fluid:  >/= 2L    Samson Frederic RD, LDN For contact information, refer to [facility].

## 2022-09-02 ENCOUNTER — Inpatient Hospital Stay (HOSPITAL_COMMUNITY): Payer: BC Managed Care – PPO

## 2022-09-02 DIAGNOSIS — J9601 Acute respiratory failure with hypoxia: Secondary | ICD-10-CM | POA: Diagnosis not present

## 2022-09-02 DIAGNOSIS — Z66 Do not resuscitate: Secondary | ICD-10-CM

## 2022-09-02 DIAGNOSIS — Z7189 Other specified counseling: Secondary | ICD-10-CM

## 2022-09-02 DIAGNOSIS — I5021 Acute systolic (congestive) heart failure: Secondary | ICD-10-CM

## 2022-09-02 DIAGNOSIS — I4819 Other persistent atrial fibrillation: Secondary | ICD-10-CM

## 2022-09-02 DIAGNOSIS — E43 Unspecified severe protein-calorie malnutrition: Secondary | ICD-10-CM | POA: Diagnosis not present

## 2022-09-02 DIAGNOSIS — C799 Secondary malignant neoplasm of unspecified site: Secondary | ICD-10-CM

## 2022-09-02 DIAGNOSIS — D6869 Other thrombophilia: Secondary | ICD-10-CM | POA: Diagnosis not present

## 2022-09-02 DIAGNOSIS — Z515 Encounter for palliative care: Secondary | ICD-10-CM | POA: Diagnosis not present

## 2022-09-02 DIAGNOSIS — J90 Pleural effusion, not elsewhere classified: Principal | ICD-10-CM

## 2022-09-02 DIAGNOSIS — C3492 Malignant neoplasm of unspecified part of left bronchus or lung: Secondary | ICD-10-CM

## 2022-09-02 DIAGNOSIS — L8990 Pressure ulcer of unspecified site, unspecified stage: Secondary | ICD-10-CM | POA: Insufficient documentation

## 2022-09-02 LAB — CBC WITH DIFFERENTIAL/PLATELET
Abs Immature Granulocytes: 0.06 10*3/uL (ref 0.00–0.07)
Basophils Absolute: 0.1 10*3/uL (ref 0.0–0.1)
Basophils Relative: 0 %
Eosinophils Absolute: 0.3 10*3/uL (ref 0.0–0.5)
Eosinophils Relative: 2 %
HCT: 34.8 % — ABNORMAL LOW (ref 39.0–52.0)
Hemoglobin: 10.7 g/dL — ABNORMAL LOW (ref 13.0–17.0)
Immature Granulocytes: 1 %
Lymphocytes Relative: 6 %
Lymphs Abs: 0.7 10*3/uL (ref 0.7–4.0)
MCH: 28.2 pg (ref 26.0–34.0)
MCHC: 30.7 g/dL (ref 30.0–36.0)
MCV: 91.8 fL (ref 80.0–100.0)
Monocytes Absolute: 0.8 10*3/uL (ref 0.1–1.0)
Monocytes Relative: 7 %
Neutro Abs: 9.5 10*3/uL — ABNORMAL HIGH (ref 1.7–7.7)
Neutrophils Relative %: 84 %
Platelets: 294 10*3/uL (ref 150–400)
RBC: 3.79 MIL/uL — ABNORMAL LOW (ref 4.22–5.81)
RDW: 15.9 % — ABNORMAL HIGH (ref 11.5–15.5)
WBC: 11.4 10*3/uL — ABNORMAL HIGH (ref 4.0–10.5)
nRBC: 0 % (ref 0.0–0.2)

## 2022-09-02 LAB — ECHOCARDIOGRAM LIMITED
Area-P 1/2: 4.36 cm2
Calc EF: 49.2 %
Height: 71 in
S' Lateral: 3.7 cm
Single Plane A2C EF: 41.2 %
Single Plane A4C EF: 53.6 %
Weight: 2045.87 oz

## 2022-09-02 LAB — GLUCOSE, CAPILLARY
Glucose-Capillary: 120 mg/dL — ABNORMAL HIGH (ref 70–99)
Glucose-Capillary: 124 mg/dL — ABNORMAL HIGH (ref 70–99)
Glucose-Capillary: 159 mg/dL — ABNORMAL HIGH (ref 70–99)
Glucose-Capillary: 163 mg/dL — ABNORMAL HIGH (ref 70–99)
Glucose-Capillary: 193 mg/dL — ABNORMAL HIGH (ref 70–99)
Glucose-Capillary: 99 mg/dL (ref 70–99)

## 2022-09-02 LAB — BASIC METABOLIC PANEL
Anion gap: 8 (ref 5–15)
BUN: 13 mg/dL (ref 8–23)
CO2: 24 mmol/L (ref 22–32)
Calcium: 10.3 mg/dL (ref 8.9–10.3)
Chloride: 104 mmol/L (ref 98–111)
Creatinine, Ser: 0.59 mg/dL — ABNORMAL LOW (ref 0.61–1.24)
GFR, Estimated: >60 mL/min (ref >60)
Glucose, Bld: 146 mg/dL — ABNORMAL HIGH (ref 70–99)
Potassium: 3 mmol/L — ABNORMAL LOW (ref 3.5–5.1)
Sodium: 136 mmol/L (ref 135–145)

## 2022-09-02 LAB — MAGNESIUM: Magnesium: 1.7 mg/dL (ref 1.7–2.4)

## 2022-09-02 LAB — PHOSPHORUS: Phosphorus: 2.9 mg/dL (ref 2.5–4.6)

## 2022-09-02 MED ORDER — POTASSIUM CHLORIDE CRYS ER 20 MEQ PO TBCR
40.0000 meq | EXTENDED_RELEASE_TABLET | ORAL | Status: AC
  Administered 2022-09-02 (×2): 40 meq via ORAL
  Filled 2022-09-02 (×2): qty 2

## 2022-09-02 MED ORDER — MAGNESIUM OXIDE -MG SUPPLEMENT 400 (240 MG) MG PO TABS
400.0000 mg | ORAL_TABLET | Freq: Every day | ORAL | Status: AC
  Administered 2022-09-02 – 2022-09-03 (×2): 400 mg via ORAL
  Filled 2022-09-02 (×2): qty 1

## 2022-09-02 MED ORDER — SODIUM CHLORIDE 0.9 % IV SOLN: INTRAVENOUS | Status: AC

## 2022-09-02 NOTE — Progress Notes (Signed)
  Echocardiogram 2D Echocardiogram has been performed.  Roger [name] 09/02/2022, 4:12 PM

## 2022-09-02 NOTE — Consult Note (Addendum)
Cardiology Consultation   Patient ID: Roger [name] MRN: [id number]; DOB: Oct 10, 1955  Admit date: 08/31/2022 Date of Consult: 09/02/2022  PCP:  Aletha Halim., PA-C   [place] Providers Cardiologist:  previously seen Roger [name] via virtual visit in Apr 2020   Patient Profile:   Roger [name] is a 67 y.o. 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  Patient was seen by Roger [name] as a new patient via virtual visit on 10/18/2018.  At the time, he had palpitation.  Symptom was attributed to PVCs.  EKG at the time showed no ST changes.  Outpatient heart monitor was recommended.  Unfortunately, he was diagnosed with metastatic lung cancer to the bone in October 2023 and has been started on chemotherapy and palliative radiation therapy.  He was admitted in November 2023 with acute hypoxic respiratory failure with O2 saturation down to the 80s.  CT of the chest found moderate PE with evidence of right heart strain.  He initially required 5 L nasal cannula however this was weaned down to 2 L nasal cannula by the time of discharge.  Echocardiogram obtained on 06/01/2022 showed EF 50 to 55%, grade 1 DD, moderately reduced RVEF was RVSP 46.4 mmHg, moderate right atrial enlargement, small pericardial effusion.  He was started on DVT/PE dosing of Eliquis was 10 mg twice a day for 7 days then go down to 5 mg twice a day thereafter.  EKG obtained on 06/02/2022 demonstrated 2:1 atrial flutter.  He was on atenolol for rate control.  Patient was instructed to follow-up with cardiology service as  outpatient.  Patient returned to the hospital on 08/23/2022 was worsening shortness of breath and weakness.  On arrival, he was in atrial fibrillation with RVR with heart rate in the 150s.  CTA of the chest obtained on 2/28 demonstrated cavitary mass in the left upper lobe and the left lower lobe, large left pleural effusion with some mild rightward shift of cardiac and mediastinal structures, previously noted hypermetabolic left anterior abdominal wall mass reduced in size when compared to November 2023, stable spiculated right lower lobe pulmonary nodule, complete consolidation and volume loss of the left lower lobe.  Patient was admitted for possible pneumonia with associated sepsis.  IR was consulted for thoracentesis.  Patient underwent left thoracentesis with removal 1 L of amber fluid on 09/01/2022.  He is currently on Unasyn IV antibiotic.  Cardiology service consulted for atrial fibrillation.   Past Medical History:  Diagnosis Date   Anxiety    Asthma    COPD (chronic obstructive pulmonary disease) ([place])    Depression    Hemorrhoids    History of radiation therapy    Abdomen- 06/08/22-06/30/22- Dr. Gery Pray   History of rectal bleeding    Hypertension    Palpitations    Pneumonia    in 2015   Post-operative nausea and vomiting    pt unsure   Primary squamous cell carcinoma of left lung ([place]) 05/03/2022    Past Surgical History:  Procedure Laterality Date   DENTAL SURGERY  MASS EXCISION Right 04/20/2022   Procedure: NEEDLE BIOPSY OF CHEST WALL MASS RIGHT LOWER CHEST WALL;  Surgeon: [name], MD;  Location: [facility];  Service: Thoracic;  Laterality: Right;   NOSE SURGERY     SKIN CANCER EXCISION     left forehead   TONSILLECTOMY     VIDEO BRONCHOSCOPY WITH ENDOBRONCHIAL NAVIGATION N/A 04/20/2022   Procedure: VIDEO BRONCHOSCOPY WITH ENDOBRONCHIAL NAVIGATION;  Surgeon: [name], MD;  Location: [facility];  Service: Thoracic;  Laterality: N/A;     Home  Medications:  Prior to Admission medications   Medication Sig Start Date End Date Taking? Authorizing Provider  albuterol (PROVENTIL HFA;VENTOLIN HFA) 108 (90 Base) MCG/ACT inhaler Inhale 2 puffs into the lungs every 6 (six) hours as needed for wheezing or shortness of breath.   Yes [provider]  ALPRAZolam Duanne Moron) 1 MG tablet Take 1 mg by mouth See admin instructions. Take 1 mg by mouth at 7 AM, 11 AM, 3 PM, 7 PM, and an additional 1 mg once a day as needed for anxiety   Yes [provider]  apixaban (ELIQUIS) 5 MG TABS tablet Take 1 tablet (5 mg total) by mouth 2 (two) times daily. 06/20/22  Yes Heilingoetter, Cassandra L, PA-C  atenolol (TENORMIN) 50 MG tablet Take 50 mg by mouth daily.   Yes [provider]  atorvastatin (LIPITOR) 10 MG tablet Take 10 mg by mouth at bedtime.   Yes [provider]  fluticasone (FLONASE) 50 MCG/ACT nasal spray Place 2 sprays into both nostrils daily as needed for allergies. 01/13/22  Yes [provider]  metFORMIN (GLUCOPHAGE-XR) 500 MG 24 hr tablet Take 500 mg by mouth See admin instructions. Take 500 mg by mouth with the largest meal of the day 03/20/22  Yes [provider]  mirtazapine (REMERON) 15 MG tablet Take 1 tablet (15 mg total) by mouth at bedtime. 06/09/22  Yes Heilingoetter, Cassandra L, PA-C  prochlorperazine (COMPAZINE) 10 MG tablet Take 1 tablet (10 mg total) by mouth every 6 (six) hours as needed for nausea or vomiting. 05/03/22  Yes Curt Bears, MD  TYLENOL 500 MG tablet Take 500-1,000 mg by mouth every 6 (six) hours as needed for mild pain or headache.   Yes [provider]  apixaban (ELIQUIS) 5 MG TABS tablet Take 2 tablets ('10mg'$ ) twice daily for 7 days, then 1 tablet ('5mg'$ ) twice daily Patient not taking: Reported on 08/31/2022 05/24/22   Heilingoetter, Cassandra L, PA-C  atenolol (TENORMIN) 25 MG tablet Take 0.5 tablets (12.5 mg total) by mouth daily. Patient not taking:  Reported on 08/31/2022 06/04/22 08/31/22  Donne Hazel, MD  lidocaine-prilocaine (EMLA) cream Apply to the Port-A-Cath site 30-60 minutes before treatment. Patient not taking: Reported on 08/31/2022 05/03/22   Curt Bears, MD    Inpatient Medications: Scheduled Meds:  ALPRAZolam  1 mg Oral QID   apixaban  5 mg Oral BID   atenolol  12.5 mg Oral Daily   atorvastatin  10 mg Oral QHS   feeding supplement  237 mL Oral BID BM   insulin aspart  0-6 Units Subcutaneous Q4H   magnesium oxide  400 mg Oral Daily   mirtazapine  15 mg Oral QHS   potassium chloride  40 mEq Oral Q4H   Ensure Max Protein  11 oz Oral Daily   saccharomyces boulardii  250 mg Oral BID   senna-docusate  1 tablet Oral BID   Continuous Infusions:  sodium chloride 75 mL/hr  at 09/02/22 1017   ampicillin-sulbactam (UNASYN) IV 3 g (09/02/22 0757)   PRN Meds: acetaminophen, albuterol, ALPRAZolam, ondansetron **OR** ondansetron (ZOFRAN) IV, prochlorperazine  Allergies:    Allergies  Allergen Reactions   Erythromycin Nausea And Vomiting and Other (See Comments)    GI Intolerance   Prednisone Other (See Comments)    "felt weird"   Sulfa Antibiotics Nausea And Vomiting and Other (See Comments)    Hematemesis (vomiting blood)     Amoxicillin-Pot Clavulanate Nausea And Vomiting and Other (See Comments)    GI Intolerance   Meperidine And Related Palpitations and Other (See Comments)    "Heart beats fast"    Social History:   Social History   Socioeconomic History   Marital status: Single    Spouse name: Not on file   Number of children: Not on file   Years of education: Not on file   Highest education level: Not on file  Occupational History   Not on file  Tobacco Use   Smoking status: Former    Packs/day: 0.50    Years: 25.00    Total pack years: 12.50    Types: Cigarettes   Smokeless tobacco: Never  Vaping Use   Vaping Use: Never used  Substance and Sexual Activity   Alcohol use: Never   Drug use:  Never   Sexual activity: Not Currently  Other Topics Concern   Not on file  Social History Narrative   Not on file   Social Determinants of Health   Financial Resource Strain: Not on file  Food Insecurity: No Food Insecurity (08/31/2022)   Hunger Vital Sign    Worried About Running Out of Food in the Last Year: Never true    Ran Out of Food in the Last Year: Never true  Transportation Needs: No Transportation Needs (08/31/2022)   PRAPARE - Transportation    Lack of Transportation (Medical): No    Lack of Transportation (Non-Medical): No  Physical Activity: Not on file  Stress: Not on file  Social Connections: Not on file  Intimate Partner Violence: Not At Risk (08/31/2022)   Humiliation, Afraid, Rape, and Kick questionnaire    Fear of Current or Ex-Partner: No    Emotionally Abused: No    Physically Abused: No    Sexually Abused: No    Family History:    Family History  Problem Relation Age of Onset   Anxiety disorder Mother    Depression Mother    Lung disease Mother        "arthritic lung"   Anxiety disorder Sister    Depression Sister    Anxiety disorder Brother    Lung disease Father    Lung cancer Father    Anxiety disorder Sister    Anxiety disorder Brother    Anxiety disorder Brother    Anxiety disorder Brother    Colon cancer Brother      ROS:  Please see the history of present illness.   All other ROS reviewed and negative.     Physical Exam/Data:   Vitals:   09/01/22 2147 09/02/22 0409 09/02/22 0415 09/02/22 1010  BP: 94/66 (!) 82/59  97/69  Pulse: 97 93  88  Resp: 20 16    Temp: 98.2 F (36.8 C) 98.3 F (36.8 C)    TempSrc:  Oral    SpO2: 98% 97%    Weight:   58 kg   Height:        Intake/Output Summary (Last 24 hours) at  09/02/2022 1144 Last data filed at 09/01/2022 1700 Gross per 24 hour  Intake 320 ml  Output --  Net 320 ml      09/02/2022    4:15 AM 09/01/2022    4:22 AM 08/31/2022    5:29 PM  Last 3 Weights  Weight (lbs) 127 lb  13.9 oz 129 lb 6.6 oz 129 lb 13.6 oz  Weight (kg) 58 kg 58.7 kg 58.9 kg     Body mass index is 17.83 kg/m.  General:  Well nourished, well developed, in no acute distress HEENT: normal Neck: no JVD Vascular: No carotid bruits; Distal pulses 2+ bilaterally Cardiac:  normal S1, S2; RRR; no murmur  Lungs:  clear to auscultation bilaterally, no wheezing, rhonchi or rales  Abd: soft, nontender, no hepatomegaly  Ext: no edema Musculoskeletal:  No deformities, BUE and BLE strength normal and equal Skin: warm and dry  Neuro:  CNs 2-12 intact, no focal abnormalities noted Psych:  Normal affect   EKG:  The EKG was personally reviewed and demonstrates: Atrial fibrillation with RVR Telemetry:  Telemetry was personally reviewed and demonstrates: Atrial fibrillation with RVR on arrival on 2/28, self converted back to sinus rhythm however went back into atrial fibrillation around 8 PM last night.  Heart rate in A-fib was 80s to 90s overnight.  Shortly after 10 AM today, went into RVR with heart rate up to 160 bpm.  This happened while the patient was straining on the toilet.  Since then, heart rate has self improved to 90s after laying down.  Relevant CV Studies:  Echo 06/01/2022  1. Left ventricular ejection fraction, by estimation, is 50 to 55%. The  left ventricle has low normal function. The left ventricle demonstrates  global hypokinesis. Left ventricular diastolic parameters are consistent  with Grade I diastolic dysfunction  (impaired relaxation). There is the interventricular septum is flattened  in systole, consistent with right ventricular pressure overload.   2. Right ventricular systolic function is moderately reduced. The right  ventricular size is moderately enlarged. There is moderately elevated  pulmonary artery systolic pressure. The estimated right ventricular  systolic pressure is [id number] mmHg.   3. Right atrial size was moderately dilated.   4. A small pericardial effusion is  present. There is no evidence of  cardiac tamponade.   5. No evidence of mitral valve regurgitation.   6. The aortic valve is tricuspid. Aortic valve regurgitation is not  visualized. Aortic valve sclerosis is present, with no evidence of aortic  valve stenosis.   7. The inferior vena cava is dilated in size with >50% respiratory  variability, suggesting right atrial pressure of 8 mmHg.   Comparison(s): No prior Echocardiogram.   Conclusion(s)/Recommendation(s): Signs of pulmonary hypertension.   Laboratory Data:  High Sensitivity Troponin:  No results for input(s): "TROPONINIHS" in the last 720 hours.   Chemistry Recent Labs  Lab 08/31/22 1146 09/01/22 0157 09/02/22 0446  NA 135 132* 136  K 3.8 3.0* 3.0*  CL 101 103 104  CO2 '24 22 24  '$ GLUCOSE 185* 120* 146*  BUN '17 12 13  '$ CREATININE 0.93 0.64 0.59*  CALCIUM 10.9* 10.5* 10.3  MG  --   --  1.7  GFRNONAA >60 >60 >60  ANIONGAP '10 7 8    '$ Recent Labs  Lab 08/31/22 1146 09/01/22 0157  PROT 7.6 7.2  ALBUMIN 2.8* 2.7*  AST 22 15  ALT 14 13  ALKPHOS 129* 116  BILITOT 1.0 0.6   Lipids No results for  input(s): "CHOL", "TRIG", "HDL", "LABVLDL", "LDLCALC", "CHOLHDL" in the last 168 hours.  Hematology Recent Labs  Lab 08/31/22 1146 09/01/22 0157 09/02/22 0446  WBC 13.0* 9.8 11.4*  RBC 3.93* 3.66* 3.79*  HGB 11.1* 10.2* 10.7*  HCT 35.9* 33.8* 34.8*  MCV 91.3 92.3 91.8  MCH 28.2 27.9 28.2  MCHC 30.9 30.2 30.7  RDW 15.6* 15.7* 15.9*  PLT 346 299 294   Thyroid No results for input(s): "TSH", "FREET4" in the last 168 hours.  BNP Recent Labs  Lab 08/31/22 1146  BNP 455.8*    DDimer No results for input(s): "DDIMER" in the last 168 hours.   Radiology/Studies:  US THORACENTESIS ASP PLEURAL SPACE W/IMG GUIDE  Result Date: 09/01/2022 INDICATION: Left pleural effusion EXAM: ULTRASOUND GUIDED LEFT therapeutic THORACENTESIS MEDICATIONS: 5 cc 1% lidocaine COMPLICATIONS: None immediate. PROCEDURE: An ultrasound guided  thoracentesis was thoroughly discussed with the patient and questions answered. The benefits, risks, alternatives and complications were also discussed. The patient understands and wishes to proceed with the procedure. Written consent was obtained. Ultrasound was performed to localize and Kyair an adequate pocket of fluid in the left chest. The area was then prepped and draped in the normal sterile fashion. 1% Lidocaine was used for local anesthesia. Under ultrasound guidance a 6 Fr Safe-T-Centesis catheter was introduced. Thoracentesis was performed. The catheter was removed and a dressing applied. FINDINGS: A total of approximately 1 L of amber fluid was removed. Ordering provider did not request laboratory samples. IMPRESSION: Successful ultrasound guided left thoracentesis yielding 1 L of pleural fluid. Follow-up chest x-ray revealed no evidence of pneumothorax. Read by: Reatha Armour, PA-C Electronically Signed   By: Corrie Mckusick D.O.   On: 09/01/2022 12:40   DG CHEST PORT 1 VIEW  Result Date: 09/01/2022 CLINICAL DATA:  Post thoracentesis. EXAM: PORTABLE CHEST 1 VIEW COMPARISON:  Chest radiograph and CTA chest 1 day prior FINDINGS: The cardiomediastinal silhouette is grossly stable. There is persistent moderate-to-large left pleural effusion following thoracentesis, decreased in size with slightly improved aeration of the left lung. The right lung is clear. There is no significant right effusion. There is no appreciable pneumothorax There is no acute osseous abnormality. IMPRESSION: Persistent moderate-to-large left pleural effusion following thoracentesis but with slightly improved aeration of the left lung. No appreciable pneumothorax. Electronically Signed   By: Valetta Mole M.D.   On: 09/01/2022 11:09   CT Angio Chest PE W and/or Wo Contrast  Result Date: 08/31/2022 CLINICAL DATA:  Stage IV bronchogenic carcinoma. Acute pulmonary embolus in November 2023. Current chemotherapy and radiation therapy.  Difficulty breathing and possible sepsis. * Tracking Code: BO * EXAM: CT ANGIOGRAPHY CHEST CT ABDOMEN AND PELVIS WITH CONTRAST TECHNIQUE: Multidetector CT imaging of the chest was performed using the standard protocol during bolus administration of intravenous contrast. Multiplanar CT image reconstructions and MIPs were obtained to evaluate the vascular anatomy. Multidetector CT imaging of the abdomen and pelvis was performed using the standard protocol during bolus administration of intravenous contrast. RADIATION DOSE REDUCTION: This exam was performed according to the departmental dose-optimization program which includes automated exposure control, adjustment of the mA and/or kV according to patient size and/or use of iterative reconstruction technique. CONTRAST:  148m OMNIPAQUE IOHEXOL 350 MG/ML SOLN COMPARISON:  Multiple exams, including CTA chest 05/31/2022 and PET-CT 03/24/2022 FINDINGS: CTA CHEST FINDINGS Cardiovascular: No current filling defect in the pulmonary arterial tree to suggest active pulmonary embolus. Coronary, aortic arch, and branch vessel atherosclerotic vascular disease. Mediastinum/Nodes: Right paratracheal node 0.9 cm in  short axis on image 37 series 3, formerly the same. Right eccentric subcarinal adenopathy 1.4 cm in short axis on image 47 series 3, previously 1.8 cm. Lungs/Pleura: Severe emphysema. 1.9 by 1.4 cm spiculated right lower lobe pulmonary nodule on image 95 series 5, very ill-defined margins, previously about 1.8 by 1.4 cm on 05/31/2022. Interstitial accentuation and hazy ground-glass opacity in the right lung and aerated portion of the left lung. Drowned lung appearance of the left lower lobe with complete consolidation and some volume loss in the left lower lobe. There is likewise passive atelectasis of much of the left upper lobe. The cavitary masses in the left upper lobe and left lower lobe are somewhat obscured, but hypodensity suggesting underlying mass measuring 6.6  by 5.6 cm in the left lower lobe observed on image 65 series 3. A left upper lobe masslike process peripherally measures 5.5 by 3.5 cm on image 50 series 3. Both are roughly similar to the 05/31/2022 exam. The large left pleural effusion is associated with some mild rightward shift of cardiac and mediastinal structures. Malignant effusion is a distinct possibility. Musculoskeletal: Lytic and primarily sclerotic lesion of the right anterior sixth rib noted on image 124 series 5, previously highly hypermetabolic on PET-CT, overall increased sclerosis and lucency in this vicinity from 05/31/2022, compatible with progressive osseous metastatic disease. Sclerotic osseous lesion in the right anterior eighth rib compatible with malignancy, increase conspicuity. Multiple sclerotic osseous metastatic lesions in the thoracic spine including the T5, T6, T10, and T12 levels compatible with malignancy, increase conspicuity from 05/31/2022. Review of the MIP images confirms the above findings. CT ABDOMEN and PELVIS FINDINGS Hepatobiliary: Unremarkable Pancreas: Unremarkable Spleen: Unremarkable Adrenals/Urinary Tract: Unremarkable Stomach/Bowel: Prominent stool throughout the colon favors constipation. Prominence of stool in the rectal vault favoring fecal impaction. No compelling evidence of stercoral colitis. Vascular/Lymphatic: Atherosclerosis is present, including aortoiliac atherosclerotic disease. No pathologic intra-abdominal adenopathy. Reproductive: Unremarkable Other: Minimal hazy stranding in the omentum and mesentery likely reflecting faint third spacing of fluid. Musculoskeletal: Soft tissue mass involving the left anterior rectus abdominus and adjacent subcutaneous tissues extending to the cutaneous surface, measuring 2.3 by 2.3 cm on image 24 series 3, previously 3.0 by 3.4 cm on 05/31/2022, and previously hypermetabolic prior PET-CT. Dominant sclerotic metastatic lesion in the L4 vertebral body measures 2.2 by 2.0  cm on image 57 series 7. This lesion was previously about 0.8 cm in diameter on 03/24/2022. New sclerotic lesions in the left iliac bone include a 1.1 by 0.8 cm lesion on image 59 of series 3 and a 0.7 cm lesion posteromedially on image 56 series 3. Review of the MIP images confirms the above findings. IMPRESSION: 1. No current filling defect in the pulmonary arterial tree to suggest active pulmonary embolus. 2. Drowned lung appearance of the left lower lobe with complete consolidation and some volume loss in the left lower lobe, and passive atelectasis of much of the left upper lobe. The cavitary masses in the left upper lobe and left lower lobe are roughly similar to 05/31/2022. 3. Mild hazy interstitial accentuation in both lungs along with faint ground-glass opacities, potentially from edema or atypical pneumonia. 4. Large left pleural effusion is associated with some mild rightward shift of cardiac and mediastinal structures. Malignant effusion is a distinct possibility. 5. Increased conspicuity and size of scattered osseous metastatic lesions. 6. The previously hypermetabolic left anterior abdominal wall mass has reduced in size compared to 05/31/2022. 7. Stable spiculated right lower lobe pulmonary nodule (previously hypermetabolic). 8.  Prominent stool throughout the colon favors constipation. Prominence of stool in the rectal vault favoring fecal impaction. No compelling evidence of stercoral colitis. 9. Aortic atherosclerosis. Aortic Atherosclerosis (ICD10-I70.0) and Emphysema (ICD10-J43.9). Electronically Signed   By: Van Clines M.D.   On: 08/31/2022 14:33   CT ABDOMEN PELVIS W CONTRAST  Result Date: 08/31/2022 CLINICAL DATA:  Stage IV bronchogenic carcinoma. Acute pulmonary embolus in November 2023. Current chemotherapy and radiation therapy. Difficulty breathing and possible sepsis. * Tracking Code: BO * EXAM: CT ANGIOGRAPHY CHEST CT ABDOMEN AND PELVIS WITH CONTRAST TECHNIQUE: Multidetector  CT imaging of the chest was performed using the standard protocol during bolus administration of intravenous contrast. Multiplanar CT image reconstructions and MIPs were obtained to evaluate the vascular anatomy. Multidetector CT imaging of the abdomen and pelvis was performed using the standard protocol during bolus administration of intravenous contrast. RADIATION DOSE REDUCTION: This exam was performed according to the departmental dose-optimization program which includes automated exposure control, adjustment of the mA and/or kV according to patient size and/or use of iterative reconstruction technique. CONTRAST:  186m OMNIPAQUE IOHEXOL 350 MG/ML SOLN COMPARISON:  Multiple exams, including CTA chest 05/31/2022 and PET-CT 03/24/2022 FINDINGS: CTA CHEST FINDINGS Cardiovascular: No current filling defect in the pulmonary arterial tree to suggest active pulmonary embolus. Coronary, aortic arch, and branch vessel atherosclerotic vascular disease. Mediastinum/Nodes: Right paratracheal node 0.9 cm in short axis on image 37 series 3, formerly the same. Right eccentric subcarinal adenopathy 1.4 cm in short axis on image 47 series 3, previously 1.8 cm. Lungs/Pleura: Severe emphysema. 1.9 by 1.4 cm spiculated right lower lobe pulmonary nodule on image 95 series 5, very ill-defined margins, previously about 1.8 by 1.4 cm on 05/31/2022. Interstitial accentuation and hazy ground-glass opacity in the right lung and aerated portion of the left lung. Drowned lung appearance of the left lower lobe with complete consolidation and some volume loss in the left lower lobe. There is likewise passive atelectasis of much of the left upper lobe. The cavitary masses in the left upper lobe and left lower lobe are somewhat obscured, but hypodensity suggesting underlying mass measuring 6.6 by 5.6 cm in the left lower lobe observed on image 65 series 3. A left upper lobe masslike process peripherally measures 5.5 by 3.5 cm on image 50 series  3. Both are roughly similar to the 05/31/2022 exam. The large left pleural effusion is associated with some mild rightward shift of cardiac and mediastinal structures. Malignant effusion is a distinct possibility. Musculoskeletal: Lytic and primarily sclerotic lesion of the right anterior sixth rib noted on image 124 series 5, previously highly hypermetabolic on PET-CT, overall increased sclerosis and lucency in this vicinity from 05/31/2022, compatible with progressive osseous metastatic disease. Sclerotic osseous lesion in the right anterior eighth rib compatible with malignancy, increase conspicuity. Multiple sclerotic osseous metastatic lesions in the thoracic spine including the T5, T6, T10, and T12 levels compatible with malignancy, increase conspicuity from 05/31/2022. Review of the MIP images confirms the above findings. CT ABDOMEN and PELVIS FINDINGS Hepatobiliary: Unremarkable Pancreas: Unremarkable Spleen: Unremarkable Adrenals/Urinary Tract: Unremarkable Stomach/Bowel: Prominent stool throughout the colon favors constipation. Prominence of stool in the rectal vault favoring fecal impaction. No compelling evidence of stercoral colitis. Vascular/Lymphatic: Atherosclerosis is present, including aortoiliac atherosclerotic disease. No pathologic intra-abdominal adenopathy. Reproductive: Unremarkable Other: Minimal hazy stranding in the omentum and mesentery likely reflecting faint third spacing of fluid. Musculoskeletal: Soft tissue mass involving the left anterior rectus abdominus and adjacent subcutaneous tissues extending to the cutaneous surface, measuring  2.3 by 2.3 cm on image 24 series 3, previously 3.0 by 3.4 cm on 05/31/2022, and previously hypermetabolic prior PET-CT. Dominant sclerotic metastatic lesion in the L4 vertebral body measures 2.2 by 2.0 cm on image 57 series 7. This lesion was previously about 0.8 cm in diameter on 03/24/2022. New sclerotic lesions in the left iliac bone include a 1.1 by  0.8 cm lesion on image 59 of series 3 and a 0.7 cm lesion posteromedially on image 56 series 3. Review of the MIP images confirms the above findings. IMPRESSION: 1. No current filling defect in the pulmonary arterial tree to suggest active pulmonary embolus. 2. Drowned lung appearance of the left lower lobe with complete consolidation and some volume loss in the left lower lobe, and passive atelectasis of much of the left upper lobe. The cavitary masses in the left upper lobe and left lower lobe are roughly similar to 05/31/2022. 3. Mild hazy interstitial accentuation in both lungs along with faint ground-glass opacities, potentially from edema or atypical pneumonia. 4. Large left pleural effusion is associated with some mild rightward shift of cardiac and mediastinal structures. Malignant effusion is a distinct possibility. 5. Increased conspicuity and size of scattered osseous metastatic lesions. 6. The previously hypermetabolic left anterior abdominal wall mass has reduced in size compared to 05/31/2022. 7. Stable spiculated right lower lobe pulmonary nodule (previously hypermetabolic). 8. Prominent stool throughout the colon favors constipation. Prominence of stool in the rectal vault favoring fecal impaction. No compelling evidence of stercoral colitis. 9. Aortic atherosclerosis. Aortic Atherosclerosis (ICD10-I70.0) and Emphysema (ICD10-J43.9). Electronically Signed   By: Van Clines M.D.   On: 08/31/2022 14:33   DG Chest Port 1 View  Result Date: 08/31/2022 CLINICAL DATA:  Lung cancer with ongoing chemotherapy and radiation therapy, breathing difficulty, questionable sepsis EXAM: PORTABLE CHEST 1 VIEW COMPARISON:  Portable exam 1102 hours compared to 06/01/2022 FINDINGS: Upper normal size of cardiac silhouette. Mediastinal contours and pulmonary vascularity normal. Chronic interstitial infiltrates. Persistent opacities in the LEFT mid lung and LEFT lower lobe with increased LEFT pleural effusion and  LEFT lung volume loss since previous study. No pneumothorax or acute osseous findings. IMPRESSION: Chronic interstitial lung disease. Persistent LEFT lung opacities in LEFT upper and LEFT lower lobes with increased LEFT pleural effusion and basilar atelectasis since prior study. Electronically Signed   By: Lavonia Dana M.D.   On: 08/31/2022 11:10     Assessment and Plan:   Atrial fibrillation with RVR  - Had 2:1 atrial flutter during hospitalization for PE in Nov 2023, did not see cardiology, was supposed to follow up with cardiology as outpatient which did not happen. Arrived this time with afib RVR in the setting of possible PNA. HR fairly controlled on 12.'5mg'$  atenolol. Self converted back to sinus rhythm yesterday, however went back into afib again around 8PM on 2/29. Overnight, HR in afib has been around 80-90s. BP borderline low. He was straining on the toilet earlier and HR increased to 170s, now after laying back down, HR improved to 90-100 range again.   - I would prefer not to start him on IV amiodarone unless HR increased to >120 persistently. For now, would continue on eliquis and atenolol  Possible pneumonia: on Unasyn.  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  Recently diagnosed DVT/PE: Diagnosed in November 2023.  Started on VTE dosing of Eliquis.  Hyperlipidemia  Risk Assessment/Risk Scores:          CHA2DS2-VASc Score = 2   This indicates a 2.2% annual risk of stroke. The patient's score is based upon: CHF History: 0 HTN History: 1 Diabetes History: 0 Stroke History: 0 Vascular Disease History: 0 Age Score: 1 Gender Score: 0         For questions or updates, please contact [facility] Please consult [email or web address] for contact info under    Signed, Almyra Deforest, [place]  09/02/2022 11:44 AM  Patient seen and examined and agree with Almyra Deforest, PA as  detailed above.   In brief, the patient is a 67 y.o. male with a hx of stage IV left lung cancer with bone mets, COPD, anxiety, recently diagnosed DVT and A-fib in November 2023 and hyperlipidemia who presented with worsening SOB found to be septic from CAP vs post-obstructive PNA with course complicated by Afib with RVR for which Cardiology has been consulted.   Patient with recent diagnosis of Aflutter during hospitalization for acute PE in 05/2022. Was managed with atenolol for rate control and apixaban for AC. Converted back to NSR prior to discharge but had recurrence of Afib with RVR this admission in the setting of sepsis secondary to pneumonia. Currently, HR much better controlled. Will continue current atenolol 12.'5mg'$  daily and apixaban. Suspect HR will improve as he clinically improves as well.   GEN: Thin male, chronically ill appearing  Neck: No JVD Cardiac: Irregularly irregular, 1/6 systolic murmur Respiratory: Diminished. Scattered wheezing GI: Soft, nontender, non-distended  MS: No edema; No deformity. Neuro:  Nonfocal  Psych: Normal affect    Plan: -Continue atenolol 12.'5mg'$  daily as HR well controlled currently -Continue apixaban '5mg'$  BID for AC -Suspect HR will improve as he clinically improves from a sepsis standpoint -Management of stage IV cancer and sepsis per primary  Will follow peripherally over the weekend and review telemetry.  Gwyndolyn Kaufman, MD

## 2022-09-02 NOTE — Consult Note (Signed)
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  Imaging on admission noted large left pleural effusion associated with mild rightward shift of cardiac and mediastinal structures which can be suggestive of a malignant effusion.  IR was consulted for thoracentesis.  Patient also receiving medical management for concerns of CAP versus postobstructive pneumonia.  Cardiology also consulted due to A-fib with RVR.  Palliative medicine team consulted to assist with complex medical decision making.  Extensive review of EMR prior to seeing patient.  Patient is followed by Dr. Earlie Server for management of stage IV non-small cell lung cancer was diagnosed in October 2023.  Patient has already completed palliative radiotherapy to a painful abdominal lesion under the care of Dr. Sondra Come.  Patient was started on systemic chemotherapy though has only been able to receive 1 dose due to adverse effects from chemotherapy.  Discussed care with hospitalist prior to seeing patient.  Also informed [facility] her care liaison of patient's admission as patient had been seen by [facility] her care palliative medicine team on 08/25/2022.  Presented to bedside and introduced myself and the role of the palliative care team to the patient.  Patient laying comfortably in bed.  No family present at patient's bedside during visit.  Patient notes he goes by "Roger [name]". After introductions, patient was able to update me in great  detail regarding his medical journey.  Patient discussed his overall "excellent" health until his diagnosis with cancer.  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  Patient noted that he tolerated radiation incredibly well without adverse effects.  It was not until his first dose of chemotherapy that he began to feel bad; notes the chemotherapy through him for a loop. Patient is easily able to discuss the plan he had determined with Dr. Earlie Server for management of his cancer.  Patient is supposed to complete 4 rounds total of systemic chemotherapy and then be placed on Keytruda for maintenance management.  Patient is easily able to voice that he knows the chemotherapy and Keytruda are to "shrink the cancer" and to keep things at [place].  Patient knows that with metastatic disease, complete remission and cure of cancer is unlikely.  As per patient, he noted he has had multiple discussions with Dr. Julien Nordmann about the great outcomes that can occur on this chemotherapy regiment.  Patient is very hopeful to regain his strength and resume chemotherapy so that then he can begin [place].  Acknowledged this and patient's wishes for care moving forward.  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  Patient feels that his strength in God gives him great strength during these times.  We discussed the concept that if a miracle is going to happen it is going to happen and spite of what the medical providers are doing.  Patient knows that God has a plan for everyone.  Discussing this, able to discuss about patient's plan for care moving  forward should he become sicker.  Discussed worry that should patient become so ill that his heart physically stops or he stops breathing, that may be God's will calling him home.  Patient actively acknowledges and noted that he would not want to impede God's will because he knows when he dies he was going to be welcomed  into [place] of heaven with ever lasting peace. We then took time to discuss his current CODE STATUS of full code.  Explained in detail what full code would entail.  Also spent time explaining in detail the CODE STATUS of DNR.  Discussed that patient can continue all appropriate medical therapies while DNR.  DNR takes effect should patient be sick enough that his heart physically stops or he stops breathing.  After this extensive discussion, patient able to easily state he wants to be DNR, continue appropriate medical therapies.  Acknowledges this and that I would change his CODE STATUS to DNR in the computer.  Patient then states he believes he filled out the wrong paperwork with someone and stated he want to be full code.  Acknowledged need for DNR paperwork to discuss wishes for care moving forward.  Also discussed patient's need to complete paperwork stating who he would want to make healthcare decisions for him should he be unable to do so.  Patient noted that he previously had other siblings who have passed away.  Patient's current family is his biological Sister Tama Gander and his sister-in-law Kristoffer Skold.  Patient voices that he would want his sister-in-law Orel Whitmoyer to make medical decision.  Again stressed importance of paperwork completion.  Spent extensive time providing emotional support via active listening.  Enjoyed visiting with patient and providing support.  Noted palliative medicine team would continue to follow with patient.  Patient voiced appreciation for visit today.  At the end of visit, cardiology presented to see patient so this provider excused herself.    Updated care team regarding change of CODE STATUS.  Primary Diagnoses  Present on Admission:  Pleural effusion   Palliative Review of Systems: Denies any symptoms of concern at this time.  Past Medical History:  Diagnosis Date   Anxiety    Asthma    COPD (chronic obstructive pulmonary disease) (HCC)     Depression    Hemorrhoids    History of radiation therapy    Abdomen- 06/08/22-06/30/22- Dr. Gery Pray   History of rectal bleeding    Hypertension    Palpitations    Pneumonia    in 2015   Post-operative nausea and vomiting    pt unsure   Primary squamous cell carcinoma of left lung ([place]) 05/03/2022   Social History   Socioeconomic History   Marital status: Single    Spouse name: Not on file   Number of children: Not on file   Years of education: Not on file   Highest education level: Not on file  Occupational History   Not on file  Tobacco Use   Smoking status: Former    Packs/day: 0.50    Years: 25.00    Total pack years: 12.50    Types: Cigarettes   Smokeless tobacco: Never  Vaping Use   Vaping Use: Never used  Substance and Sexual Activity   Alcohol use: Never   Drug use: Never   Sexual activity: Not Currently  Other Topics Concern   Not on file  Social History Narrative   Not on file   Social Determinants of Health  Financial Resource Strain: Not on file  Food Insecurity: No Food Insecurity (08/31/2022)   Hunger Vital Sign    Worried About Running Out of Food in the Last Year: Never true    Ran Out of Food in the Last Year: Never true  Transportation Needs: No Transportation Needs (08/31/2022)   PRAPARE - Hydrologist (Medical): No    Lack of Transportation (Non-Medical): No  Physical Activity: Not on file  Stress: Not on file  Social Connections: Not on file   Family History  Problem Relation Age of Onset   Anxiety disorder Mother    Depression Mother    Lung disease Mother        "arthritic lung"   Anxiety disorder Sister    Depression Sister    Anxiety disorder Brother    Lung disease Father    Lung cancer Father    Anxiety disorder Sister    Anxiety disorder Brother    Anxiety disorder Brother    Anxiety disorder Brother    Colon cancer Brother    Scheduled Meds:  ALPRAZolam  1 mg Oral QID   apixaban  5  mg Oral BID   atenolol  12.5 mg Oral Daily   atorvastatin  10 mg Oral QHS   feeding supplement  237 mL Oral BID BM   insulin aspart  0-6 Units Subcutaneous Q4H   magnesium oxide  400 mg Oral Daily   mirtazapine  15 mg Oral QHS   potassium chloride  40 mEq Oral Q4H   Ensure Max Protein  11 oz Oral Daily   saccharomyces boulardii  250 mg Oral BID   senna-docusate  1 tablet Oral BID   Continuous Infusions:  sodium chloride     ampicillin-sulbactam (UNASYN) IV 3 g (09/02/22 0757)   PRN Meds:.acetaminophen, albuterol, ALPRAZolam, ondansetron **OR** ondansetron (ZOFRAN) IV, prochlorperazine Allergies  Allergen Reactions   Erythromycin Nausea And Vomiting and Other (See Comments)    GI Intolerance   Prednisone Other (See Comments)    "felt weird"   Sulfa Antibiotics Nausea And Vomiting and Other (See Comments)    Hematemesis (vomiting blood)     Amoxicillin-Pot Clavulanate Nausea And Vomiting and Other (See Comments)    GI Intolerance   Meperidine And Related Palpitations and Other (See Comments)    "Heart beats fast"   CBC:    Component Value Date/Time   WBC 11.4 (H) 09/02/2022 0446   HGB 10.7 (L) 09/02/2022 0446   HGB 12.9 (L) 07/13/2022 1330   HCT 34.8 (L) 09/02/2022 0446   PLT 294 09/02/2022 0446   PLT 310 07/13/2022 1330   MCV 91.8 09/02/2022 0446   NEUTROABS 9.5 (H) 09/02/2022 0446   LYMPHSABS 0.7 09/02/2022 0446   MONOABS 0.8 09/02/2022 0446   EOSABS 0.3 09/02/2022 0446   BASOSABS 0.1 09/02/2022 0446   Comprehensive Metabolic Panel:    Component Value Date/Time   NA 136 09/02/2022 0446   K 3.0 (L) 09/02/2022 0446   CL 104 09/02/2022 0446   CO2 24 09/02/2022 0446   BUN 13 09/02/2022 0446   CREATININE 0.59 (L) 09/02/2022 0446   CREATININE 0.83 07/13/2022 1330   GLUCOSE 146 (H) 09/02/2022 0446   CALCIUM 10.3 09/02/2022 0446   AST 15 09/01/2022 0157   AST 19 07/13/2022 1330   ALT 13 09/01/2022 0157   ALT 15 07/13/2022 1330   ALKPHOS 116 09/01/2022 0157    BILITOT 0.6 09/01/2022 0157   BILITOT 0.5 07/13/2022  1330   PROT 7.2 09/01/2022 0157   ALBUMIN 2.7 (L) 09/01/2022 0157    Physical Exam: Vital Signs: BP 97/69 (BP Location: Left Arm, Patient Position: Sitting)   Pulse 88   Temp 98.3 F (36.8 C) (Oral)   Resp 16   Ht '5\' 11"'$  (1.803 m)   Wt 58 kg   SpO2 97%   BMI 17.83 kg/m  SpO2: SpO2: 97 % O2 Device: O2 Device: Nasal Cannula O2 Flow Rate: O2 Flow Rate (L/min): 2 L/min Intake/output summary:  Intake/Output Summary (Last 24 hours) at 09/02/2022 1015 Last data filed at 09/01/2022 1700 Gross per 24 hour  Intake 320 ml  Output --  Net 320 ml   LBM: Last BM Date : 09/01/22 Baseline Weight: Weight: 59.9 kg Most recent weight: Weight: 58 kg  General: NAD, alert, pleasant, laying in bed, frail Eyes: no discharge noted HENT: moist mucous membranes Cardiovascular: RRR, no edema in LE b/l Respiratory: no increased work of breathing noted, not in respiratory distress, on [place] support Abdomen: not distended Extremities: moving all appropriately Skin: no rashes or lesions on visible skin Neuro: A&Ox4, following commands easily Psych: appropriately answers all questions          Palliative Performance Scale: 60%              Additional Data Reviewed: Recent Labs    09/01/22 0157 09/02/22 0446  WBC 9.8 11.4*  HGB 10.2* 10.7*  PLT 299 294  NA 132* 136  BUN 12 13  CREATININE 0.64 0.59*    Imaging: US THORACENTESIS ASP PLEURAL SPACE W/IMG GUIDE INDICATION: Left pleural effusion  EXAM: ULTRASOUND GUIDED LEFT therapeutic THORACENTESIS  MEDICATIONS: 5 cc 1% lidocaine  COMPLICATIONS: None immediate.  PROCEDURE: An ultrasound guided thoracentesis was thoroughly discussed with the patient and questions answered. The benefits, risks, alternatives and complications were also discussed. The patient understands and wishes to proceed with the procedure. Written consent was obtained.  Ultrasound was performed to localize and  [place] an adequate pocket of fluid in the left chest. The area was then prepped and draped in the normal sterile fashion. 1% Lidocaine was used for local anesthesia. Under ultrasound guidance a 6 Fr Safe-T-Centesis catheter was introduced. Thoracentesis was performed. The catheter was removed and a dressing applied.  FINDINGS: A total of approximately 1 L of amber fluid was removed. Ordering provider did not request laboratory samples.  IMPRESSION: Successful ultrasound guided left thoracentesis yielding 1 L of pleural fluid. Follow-up chest x-ray revealed no evidence of pneumothorax.  Read by: Reatha Armour, PA-C  Electronically Signed   By: Corrie Mckusick D.O.   On: 09/01/2022 12:40 DG CHEST PORT 1 VIEW CLINICAL DATA:  Post thoracentesis.  EXAM: PORTABLE CHEST 1 VIEW  COMPARISON:  Chest radiograph and CTA chest 1 day prior  FINDINGS: The cardiomediastinal silhouette is grossly stable.  There is persistent moderate-to-large left pleural effusion following thoracentesis, decreased in size with slightly improved aeration of the left lung. The right lung is clear. There is no significant right effusion. There is no appreciable pneumothorax  There is no acute osseous abnormality.  IMPRESSION: Persistent moderate-to-large left pleural effusion following thoracentesis but with slightly improved aeration of the left lung. No appreciable pneumothorax.  Electronically Signed   By: Valetta Mole M.D.   On: 09/01/2022 11:09    I personally reviewed recent imaging.   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  Imaging on admission noted large left  pleural effusion associated with mild rightward shift of cardiac and mediastinal structures which can be suggestive of a malignant effusion.  IR was consulted for thoracentesis.  Patient also receiving medical management for concerns of CAP versus postobstructive pneumonia.  Cardiology also consulted due to A-fib with RVR.  Palliative medicine team consulted to assist with complex medical decision making.  # Complex medical decision making/goals of care  -Extensive discussion with patient as documented above in HPI.  Patient able to voice hopes for medical care moving forward.  Patient wants to continue follow-up with Dr. Earlie Server to continue chemotherapy and then transition to [facility] if appropriate.  Patient is hopeful that these therapies will be able to shrink and hold his bed to allow him to have more quality time moving forward.  -Patient is Roger [name] and is very spiritual and has faith in [employer] plan.  In regards to this, able to discuss if patient was so sick his heart were to stop or to stop breathing, he would want to be allowed to have a natural death to continue onto God and heaven.  Discussed CODE STATUS will appropriately be updated to DNR, continue appropriate medical care.  -Patient stated that if he was unable to make medical decisions for himself, he would want his sister-in-law Keigen Plaut to make medical decisions for him.  Emphasized importance of ACP documentation completion as his sister Inez Catalina is technically his next of kin and would be responsible for medical decision making.  Consulted chaplain to assist with this.  -  Code Status: DNR   # Symptom management  -As per primary team.   # Psycho-social/Spiritual Support:  - Support System: Sister - Desire for further Chaplain support:yes  # Discharge Planning:  To Be Determined  -Patient seen by [facility] palliative team at home. If patient is going to be following up at [facility] for oncology visits, would also recommend involvement of  palliative provider at [facility].   Thank you for allowing the palliative care team to participate in the care [facility].  Chelsea Aus, DO Palliative Care Provider PMT # [phone]  This provider spent a total of 89 minutes providing patient's care.  Includes review of EMR, discussing care with other staff members involved in patient's medical care, obtaining relevant history and information from patient and/or patient's family, and personal review of imaging and lab work. Greater than 50% of the time was spent counseling and coordinating care related to the above assessment and plan.

## 2022-09-02 NOTE — Progress Notes (Signed)
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  Subjective:   Sbp in the 80's, he remains in afib, was in  afib/RVR this am briefly , he denies afib symptoms K is low  He said the heart doctor listened to him this am and said " your heart is strong"   He said the heart doctor did not think " I had afib" I told him that cardiology consulted because he has afib  he denies pain, no sob, no edema, no fever,     Assessment & Plan:  Principal Problem:   Pleural effusion Active Problems:   Protein-calorie malnutrition, severe   Palliative care encounter   Metastatic malignant neoplasm ([place])   Goals of care, counseling/discussion   DNR (do not resuscitate)   SCC of lung (small cell carcinoma), left (HCC)   Pleural effusion, left   Acute hypoxic respiratory failure ([place])   Persistent atrial fibrillation ([place])   Secondary hypercoagulable state ([place])   Pressure injury of skin    Assessment and Plan:   Pleural effusion with acute on chronic respiratory failure , report O2 as needed at home -in background of stage IV non-small cell lung cancer  -CT scan noting large left pleural effusion  associated with  mild rightward shift of cardiac and mediastinal structures. Suggestive of Malignant effusion . --s/p thoracentesis, resumed  Eliquis, fluids culture no growth, gram stain negative -I have discussed the possibility of malignant effusion that most often will recur, he might actually require Pleurx placement for symptom relief   CAP vs Post obstructive PNA  with associated sepsis/lactic acidosis -mrsa screening negative,  -Urine strep pneumo negative , does not have much cough , blood culture no growth    - change vanc/cefepime to unasyn -pulmonary toilet , f/u on pleural fluids  culture  Hypotension Bp 77/51 on presentation, has improved some but still borderline low Will give hydration, check cortisol level May need long term midodrine  Lactic acidosis -multifactorial,infection / hypoxemia  -resolved  Afib Per chart review he was diagnosed with A-fib in December 2023 However he report he was never told that he had heart issues Resume atenolol at lower dose with holding parameters due to soft BP, already on Eliquis Consult cardiology   DVT /PE, 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    The patient is status post palliative radiotherapy to the painful abdominal lesion under the care of Dr. Sondra Come.  systemic chemotherapy with carboplatin for AUC of 5, paclitaxel 175 Mg/M2 and Keytruda 200 Mg IV every 3 weeks with Neulasta support status post 1 cycle which was given on May 09, 2022 , appeared that the only cycle he received so far  FTT, poor prognosis, palliative care consulted for goals of care discussion   Nutritional Assessment: The patient's BMI is: Body mass index is 17.83 kg/m.Marland Kitchen Seen by dietician.  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   Skin Assessment: I have examined the  patient's skin and I agree with the wound assessment as performed by the wound care RN as outlined below: Pressure Injury 08/31/22 Sacrum Stage 2 -  Partial thickness loss of dermis presenting as a shallow open injury with a red, pink wound bed without slough. red, open (Active)  08/31/22 1752  Location: Sacrum  Location Orientation:   Staging: Stage 2 -  Partial thickness loss of dermis presenting as a shallow open injury with a red, pink wound bed without slough.  Wound Description (Comments): red, open  Present on Admission: Yes  Dressing Type Foam - Lift dressing to assess site every shift 09/02/22 0800     I have Reviewed nursing notes, Vitals, pain scores, I/o's, Lab results and  imaging results since pt's last encounter, details please see discussion above  I ordered the following labs:  Unresulted Labs (From admission, onward)     Start     Ordered   09/03/22 0500  Cortisol  Tomorrow morning,   R        09/02/22 0903   09/03/22 0500  CBC with Differential/Platelet  Tomorrow morning,   R        09/02/22 0903   09/03/22 [id number]  Basic metabolic panel  Tomorrow morning,   R        09/02/22 0903   09/01/22 1222  Total bilirubin, body fluid  RELEASE UPON ORDERING,   TIMED        09/01/22 1222   08/31/22 2244  Legionella Pneumophila Serogp 1 Ur Ag  Once,   R        08/31/22 2244   08/31/22 2244  Expectorated Sputum Assessment w Gram Stain, Rflx to Resp Cult  Once,   R        08/31/22 2244             DVT prophylaxis:  apixaban (ELIQUIS) tablet 5 mg   Code Status:   Code Status: DNR  Family Communication: Reported HPOA which his sister in law, he declined my offer to call his family  Disposition:    Dispo: The patient is from: home, lives with his sister              Anticipated d/c is to: home              Anticipated d/c date is: 24-48hrs, pending heart rate control, fluids culture  Antimicrobials:    Anti-infectives (From admission, onward)    Start      Dose/Rate Route Frequency Ordered Stop   09/01/22 2000  Ampicillin-Sulbactam (UNASYN) 3 g in sodium chloride 0.9 % 100 mL IVPB        3 g 200 mL/hr over 30 Minutes Intravenous Every 6 hours 09/01/22 1817     09/01/22 1000  vancomycin (VANCOREADY) IVPB 1250 mg/250 mL  Status:  Discontinued        1,250 mg 166.7 mL/hr over 90 Minutes Intravenous Every 24 hours 08/31/22 1710 09/01/22 1248   08/31/22 2200  ceFEPIme (MAXIPIME) 2 g in sodium chloride 0.9 % 100 mL IVPB  Status:  Discontinued        2 g 200 mL/hr  over 30 Minutes Intravenous Every 8 hours 08/31/22 1652 09/01/22 1817   08/31/22 1330  vancomycin (VANCOCIN) IVPB 1000 mg/200 mL premix        1,000 mg 200 mL/hr over 60 Minutes Intravenous  Once 08/31/22 1316 08/31/22 1553   08/31/22 1315  ceFEPIme (MAXIPIME) 2 g in sodium chloride 0.9 % 100 mL IVPB        2 g 200 mL/hr over 30 Minutes Intravenous  Once 08/31/22 1306 08/31/22 1448          Objective: Vitals:   09/02/22 0409 09/02/22 0415 09/02/22 1010 09/02/22 1237  BP: (!) 82/59  97/69 100/70  Pulse: 93  88 71  Resp: 16   19  Temp: 98.3 F (36.8 C)   97.6 F (36.4 C)  TempSrc: Oral     SpO2: 97%   98%  Weight:  58 kg    Height:        Intake/Output Summary (Last 24 hours) at 09/02/2022 1808 Last data filed at 09/02/2022 1700 Gross per 24 hour  Intake 953.53 ml  Output 200 ml  Net 753.53 ml   Filed Weights   08/31/22 1729 09/01/22 0422 09/02/22 0415  Weight: 58.9 kg 58.7 kg 58 kg    Examination:  General exam: AAOx3, thin and frail, chronic ill-appearing Respiratory system: Diminished, no wheezing, no rales, no rhonchi. Respiratory effort normal. Cardiovascular system:  IRRR.  Gastrointestinal system: Abdomen is nondistended, soft and nontender.  Normal bowel sounds heard. Central nervous system: Alert and oriented. No focal neurological deficits. Extremities:  no edema Skin: No rashes, lesions or ulcers Psychiatry: Calm and cooperative, appear having a hard  time dealing with cancer diagnosis, report has been always healthy in the past    Data Reviewed: I have personally reviewed  labs and visualized  imaging studies since the last encounter and formulate the plan        Scheduled Meds:  ALPRAZolam  1 mg Oral QID   apixaban  5 mg Oral BID   atenolol  12.5 mg Oral Daily   atorvastatin  10 mg Oral QHS   feeding supplement  237 mL Oral BID BM   insulin aspart  0-6 Units Subcutaneous Q4H   magnesium oxide  400 mg Oral Daily   mirtazapine  15 mg Oral QHS   Ensure Max Protein  11 oz Oral Daily   saccharomyces boulardii  250 mg Oral BID   senna-docusate  1 tablet Oral BID   Continuous Infusions:  sodium chloride 75 mL/hr at 09/02/22 1533   ampicillin-sulbactam (UNASYN) IV Stopped (09/02/22 1336)     LOS: 2 days   Time spent:  38mns  FFlorencia Reasons MD PhD FACP [facility]  Available via Epic secure chat 7am-7pm for nonurgent issues Please page for urgent issues To page the attending provider between 7A-7P or the covering provider during after hours 7P-7A, please log into the web site [email or web address] and access using universal [place] password for that web site. If you do not have the password, please call the hospital operator.    09/02/2022, 6:08 PM

## 2022-09-03 ENCOUNTER — Inpatient Hospital Stay (HOSPITAL_COMMUNITY): Payer: BC Managed Care – PPO

## 2022-09-03 DIAGNOSIS — I5021 Acute systolic (congestive) heart failure: Secondary | ICD-10-CM | POA: Diagnosis not present

## 2022-09-03 DIAGNOSIS — I4891 Unspecified atrial fibrillation: Secondary | ICD-10-CM | POA: Diagnosis not present

## 2022-09-03 DIAGNOSIS — J90 Pleural effusion, not elsewhere classified: Secondary | ICD-10-CM | POA: Diagnosis not present

## 2022-09-03 LAB — CBC WITH DIFFERENTIAL/PLATELET
Abs Immature Granulocytes: 0.05 10*3/uL (ref 0.00–0.07)
Basophils Absolute: 0.1 10*3/uL (ref 0.0–0.1)
Basophils Relative: 1 %
Eosinophils Absolute: 0.2 10*3/uL (ref 0.0–0.5)
Eosinophils Relative: 2 %
HCT: 37.1 % — ABNORMAL LOW (ref 39.0–52.0)
Hemoglobin: 11.1 g/dL — ABNORMAL LOW (ref 13.0–17.0)
Immature Granulocytes: 1 %
Lymphocytes Relative: 7 %
Lymphs Abs: 0.8 10*3/uL (ref 0.7–4.0)
MCH: 28.5 pg (ref 26.0–34.0)
MCHC: 29.9 g/dL — ABNORMAL LOW (ref 30.0–36.0)
MCV: 95.1 fL (ref 80.0–100.0)
Monocytes Absolute: 0.7 10*3/uL (ref 0.1–1.0)
Monocytes Relative: 6 %
Neutro Abs: 9.1 10*3/uL — ABNORMAL HIGH (ref 1.7–7.7)
Neutrophils Relative %: 83 %
Platelets: 306 10*3/uL (ref 150–400)
RBC: 3.9 MIL/uL — ABNORMAL LOW (ref 4.22–5.81)
RDW: 15.9 % — ABNORMAL HIGH (ref 11.5–15.5)
WBC: 10.9 10*3/uL — ABNORMAL HIGH (ref 4.0–10.5)
nRBC: 0 % (ref 0.0–0.2)

## 2022-09-03 LAB — GLUCOSE, CAPILLARY
Glucose-Capillary: 101 mg/dL — ABNORMAL HIGH (ref 70–99)
Glucose-Capillary: 107 mg/dL — ABNORMAL HIGH (ref 70–99)
Glucose-Capillary: 107 mg/dL — ABNORMAL HIGH (ref 70–99)
Glucose-Capillary: 127 mg/dL — ABNORMAL HIGH (ref 70–99)
Glucose-Capillary: 129 mg/dL — ABNORMAL HIGH (ref 70–99)
Glucose-Capillary: 138 mg/dL — ABNORMAL HIGH (ref 70–99)
Glucose-Capillary: 192 mg/dL — ABNORMAL HIGH (ref 70–99)

## 2022-09-03 LAB — BASIC METABOLIC PANEL
Anion gap: 10 (ref 5–15)
BUN: 10 mg/dL (ref 8–23)
CO2: 25 mmol/L (ref 22–32)
Calcium: 10.3 mg/dL (ref 8.9–10.3)
Chloride: 105 mmol/L (ref 98–111)
Creatinine, Ser: 0.61 mg/dL (ref 0.61–1.24)
GFR, Estimated: >60 mL/min (ref >60)
Glucose, Bld: 113 mg/dL — ABNORMAL HIGH (ref 70–99)
Potassium: 3.7 mmol/L (ref 3.5–5.1)
Sodium: 140 mmol/L (ref 135–145)

## 2022-09-03 LAB — CORTISOL: Cortisol, Plasma: 19.3 ug/dL

## 2022-09-03 MED ORDER — AMIODARONE HCL IN DEXTROSE 360-4.14 MG/200ML-% IV SOLN
30.0000 mg/h | INTRAVENOUS | Status: DC
  Administered 2022-09-03 – 2022-09-07 (×8): 30 mg/h via INTRAVENOUS
  Filled 2022-09-03 (×8): qty 200

## 2022-09-03 MED ORDER — POTASSIUM CHLORIDE CRYS ER 20 MEQ PO TBCR
40.0000 meq | EXTENDED_RELEASE_TABLET | Freq: Once | ORAL | Status: AC
  Administered 2022-09-03: 40 meq via ORAL
  Filled 2022-09-03: qty 2

## 2022-09-03 MED ORDER — MIDODRINE HCL 5 MG PO TABS: 2.5000 mg | ORAL_TABLET | Freq: Two times a day (BID) | ORAL | Status: DC

## 2022-09-03 MED ORDER — LIDOCAINE HCL 1 % IJ SOLN
INTRAMUSCULAR | Status: AC
  Filled 2022-09-03: qty 20

## 2022-09-03 MED ORDER — POLYETHYLENE GLYCOL 3350 17 G PO PACK
17.0000 g | PACK | Freq: Every day | ORAL | Status: DC
  Administered 2022-09-05 – 2022-09-09 (×5): 17 g via ORAL
  Filled 2022-09-03 (×8): qty 1

## 2022-09-03 MED ORDER — SODIUM CHLORIDE 0.9 % IV SOLN
3.0000 g | Freq: Four times a day (QID) | INTRAVENOUS | Status: AC
  Administered 2022-09-03 – 2022-09-07 (×14): 3 g via INTRAVENOUS
  Filled 2022-09-03 (×14): qty 8

## 2022-09-03 MED ORDER — AMIODARONE HCL IN DEXTROSE 360-4.14 MG/200ML-% IV SOLN
60.0000 mg/h | INTRAVENOUS | Status: AC
  Administered 2022-09-03: 60 mg/h via INTRAVENOUS
  Filled 2022-09-03: qty 200

## 2022-09-03 NOTE — Progress Notes (Signed)
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  Subjective:    He was started on amiodarone drip for afib control He requested to have another thoracentesis ,reports felt better after the first one, now he feels like he needs it again, repeat cxr showed persistent large effusion on the left    he denies pain, no edema, no fever,     Assessment & Plan:  Principal Problem:   Pleural effusion Active Problems:   Protein-calorie malnutrition, severe   Palliative care encounter   Metastatic malignant neoplasm ([place])   Goals of care, counseling/discussion   DNR (do not resuscitate)   SCC of lung (small cell carcinoma), left (HCC)   Pleural effusion, left   Acute hypoxic respiratory failure (HCC)   Persistent atrial fibrillation (HCC)   Secondary hypercoagulable state ([place])   Pressure injury of skin   Atrial fibrillation with RVR ([place])   Acute systolic heart failure (HCC)    Assessment and Plan:   Pleural effusion with acute on chronic respiratory failure , report O2 as needed at home -in background of stage IV non-small cell lung cancer  -CT scan noting large left pleural effusion  associated with  mild rightward shift of cardiac and mediastinal structures. Suggestive of Malignant effusion . --s/p thoracentesis on 2/29 with 1liter removed,  fluids culture no growth, gram stain negative -he requested to have another thoracentesis done , ordered -I have discussed the possibility of malignant effusion that most often will recur, he might actually require Pleurx placement for symptom relief   CAP vs Post obstructive PNA  with associated sepsis/lactic acidosis -mrsa screening negative,  -Urine strep pneumo negative , does not have much cough , blood culture no growth    - change  vanc/cefepime to unasyn to finish total of 7 days abx treatment   Hypotension Bp 77/51 on presentation, has improved some but still borderline low am random cortisol level 19 Hold atenolol  May need  midodrine  Lactic acidosis -multifactorial,infection / hypoxemia  -resolved  Afib Per chart review he was diagnosed with A-fib in December 2023 Cardiology input appreciated, hold atenolol due to borderline bp, started on amiodarone drip on 3/2 continue Eliquis F/u cardiology recommendation  DVT /PE, 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    The patient is status post palliative radiotherapy to the painful abdominal lesion under the care of Dr. Sondra Come.  systemic chemotherapy with carboplatin for AUC of 5, paclitaxel 175 Mg/M2 and Keytruda 200 Mg IV every 3 weeks with Neulasta support status post 1 cycle which was given on May 09, 2022 , appeared that the only cycle he received so far  FTT, poor prognosis, palliative care consulted for goals of care discussion   Nutritional Assessment: The patient's BMI is: Body mass index is 18.57 kg/m.Marland Kitchen Seen by dietician.  I agree with the assessment and plan as outlined below: Nutrition Status: Nutrition Problem: Severe Malnutrition Etiology: chronic illness (SCC of left lung with mets to  bone) Signs/Symptoms: severe fat depletion, severe muscle depletion, percent weight loss (24% in 7 months) Percent weight loss: 24 % (in 7 months) Interventions: Ensure Enlive (each supplement provides 350kcal and 20 grams of protein), Prostat, Refer to RD note for recommendations  .   Skin  Assessment: I have examined the patient's skin and I agree with the wound assessment as performed by the wound care RN as outlined below: Pressure Injury 08/31/22 Sacrum Stage 2 -  Partial thickness loss of dermis presenting as a shallow open injury with a red, pink wound bed without slough. red, open (Active)  08/31/22 1752  Location: Sacrum  Location Orientation:   Staging: Stage 2 -  Partial thickness loss of dermis presenting as a shallow open injury with a red, pink wound bed without slough.  Wound Description (Comments): red, open  Present on Admission: Yes  Dressing Type Foam - Lift dressing to assess site every shift 09/03/22 1023     I have Reviewed nursing notes, Vitals, pain scores, I/o's, Lab results and  imaging results since pt's last encounter, details please see discussion above  I ordered the following labs:  Unresulted Labs (From admission, onward)     Start     Ordered   09/01/22 1222  Total bilirubin, body fluid  RELEASE UPON ORDERING,   TIMED        09/01/22 1222   08/31/22 2244  Legionella Pneumophila Serogp 1 Ur Ag  Once,   R        08/31/22 2244   08/31/22 2244  Expectorated Sputum Assessment w Gram Stain, Rflx to Resp Cult  Once,   R        08/31/22 2244             DVT prophylaxis:  apixaban (ELIQUIS) tablet 5 mg   Code Status:   Code Status: DNR  Family Communication: Reported HPOA which his sister in law, he declined my offer to call his family  Disposition:    Dispo: The patient is from: home, lives with his sister              Anticipated d/c is to: home              Anticipated d/c date is:  pending heart rate control, need repeat thoracentesis, may need to discuss pleurx placement  Antimicrobials:    Anti-infectives (From admission, onward)    Start     Dose/Rate Route Frequency Ordered Stop   09/01/22 2000  Ampicillin-Sulbactam (UNASYN) 3 g in sodium chloride 0.9 % 100 mL IVPB  Status:  Discontinued        3 g 200 mL/hr over 30  Minutes Intravenous Every 6 hours 09/01/22 1817 09/03/22 1327   09/01/22 1000  vancomycin (VANCOREADY) IVPB 1250 mg/250 mL  Status:  Discontinued        1,250 mg 166.7 mL/hr over 90 Minutes Intravenous Every 24 hours 08/31/22 1710 09/01/22 1248   08/31/22 2200  ceFEPIme (MAXIPIME) 2 g in sodium chloride 0.9 % 100 mL IVPB  Status:  Discontinued        2 g 200 mL/hr over 30 Minutes Intravenous Every 8 hours 08/31/22 1652 09/01/22 1817   08/31/22 1330  vancomycin (VANCOCIN) IVPB 1000 mg/200 mL premix        1,000 mg 200 mL/hr over 60 Minutes Intravenous  Once 08/31/22 1316 08/31/22 1553   08/31/22 1315  ceFEPIme (MAXIPIME) 2 g in sodium chloride 0.9 % 100 mL IVPB  2 g 200 mL/hr over 30 Minutes Intravenous  Once 08/31/22 1306 08/31/22 1448          Objective: Vitals:   09/03/22 0013 09/03/22 0416 09/03/22 0500 09/03/22 1310  BP: 100/62 104/71  97/73  Pulse: 93 97  93  Resp: '18 19  19  '$ Temp: 99 F (37.2 C) 98.3 F (36.8 C)  97.6 F (36.4 C)  TempSrc:  Oral  Oral  SpO2: 92% 96%  94%  Weight:  59.1 kg 60.4 kg   Height:        Intake/Output Summary (Last 24 hours) at 09/03/2022 1328 Last data filed at 09/03/2022 1200 Gross per 24 hour  Intake 1173.53 ml  Output 775 ml  Net 398.53 ml   Filed Weights   09/02/22 0415 09/03/22 0416 09/03/22 0500  Weight: 58 kg 59.1 kg 60.4 kg    Examination:  General exam: AAOx3, thin and frail, chronic ill-appearing Respiratory system: Diminished, no wheezing, no rales, no rhonchi. Respiratory effort normal. Cardiovascular system:  IRRR.  Gastrointestinal system: Abdomen is nondistended, soft and nontender.  Normal bowel sounds heard. Central nervous system: Alert and oriented. No focal neurological deficits. Extremities:  no edema Skin: No rashes, lesions or ulcers Psychiatry: Calm and cooperative, appear having a hard time dealing with cancer diagnosis, report has been always healthy in the past    Data Reviewed: I have  personally reviewed  labs and visualized  imaging studies since the last encounter and formulate the plan        Scheduled Meds:  ALPRAZolam  1 mg Oral QID   apixaban  5 mg Oral BID   atorvastatin  10 mg Oral QHS   feeding supplement  237 mL Oral BID BM   insulin aspart  0-6 Units Subcutaneous Q4H   mirtazapine  15 mg Oral QHS   Ensure Max Protein  11 oz Oral Daily   saccharomyces boulardii  250 mg Oral BID   senna-docusate  1 tablet Oral BID   Continuous Infusions:  amiodarone 60 mg/hr (09/03/22 1022)   Followed by   amiodarone       LOS: 3 days   Time spent:  110mns  FFlorencia Reasons MD PhD FACP [facility]  Available via Epic secure chat 7am-7pm for nonurgent issues Please page for urgent issues To page the attending provider between 7A-7P or the covering provider during after hours 7P-7A, please log into the web site [email or web address] and access using universal [place] password for that web site. If you do not have the password, please call the hospital operator.    09/03/2022, 1:28 PM

## 2022-09-03 NOTE — Progress Notes (Signed)
Rounding Note    Patient Name: Roger [name] Date of Encounter: 09/03/2022  [facility] Cardiologist: None   Subjective   BP 104/71.  Cr stable at 0.61.  In SR for ~7 hours overnight but now back in Afib.  Rates up to 140s this morning.  Reports ome dyspnea but denies any chest pain.  Inpatient Medications    Scheduled Meds:  ALPRAZolam  1 mg Oral QID   apixaban  5 mg Oral BID   atenolol  12.5 mg Oral Daily   atorvastatin  10 mg Oral QHS   feeding supplement  237 mL Oral BID BM   insulin aspart  0-6 Units Subcutaneous Q4H   magnesium oxide  400 mg Oral Daily   mirtazapine  15 mg Oral QHS   potassium chloride  40 mEq Oral Once   Ensure Max Protein  11 oz Oral Daily   saccharomyces boulardii  250 mg Oral BID   senna-docusate  1 tablet Oral BID   Continuous Infusions:  sodium chloride 75 mL/hr at 09/02/22 2013   ampicillin-sulbactam (UNASYN) IV 3 g (09/03/22 0114)   PRN Meds: acetaminophen, albuterol, ALPRAZolam, ondansetron **OR** ondansetron (ZOFRAN) IV, prochlorperazine   Vital Signs    Vitals:   09/02/22 1237 09/03/22 0013 09/03/22 0416 09/03/22 0500  BP: 100/70 100/62 104/71   Pulse: 71 93 97   Resp: '19 18 19   '$ Temp: 97.6 F (36.4 C) 99 F (37.2 C) 98.3 F (36.8 C)   TempSrc:   Oral   SpO2: 98% 92% 96%   Weight:   59.1 kg 60.4 kg  Height:        Intake/Output Summary (Last 24 hours) at 09/03/2022 0856 Last data filed at 09/02/2022 2300 Gross per 24 hour  Intake 953.53 ml  Output 200 ml  Net 753.53 ml      09/03/2022    5:00 AM 09/03/2022    4:16 AM 09/02/2022    4:15 AM  Last 3 Weights  Weight (lbs) 133 lb 2.5 oz 130 lb 4.7 oz 127 lb 13.9 oz  Weight (kg) 60.4 kg 59.1 kg 58 kg      Telemetry    Afib rate 90-110s overnight but currently up to 140s- Personally Reviewed  ECG    No new ECG- Personally Reviewed  Physical Exam   GEN: No acute distress.   Neck: No JVD Cardiac: Irregular, tachycardic no murmurs Respiratory: Diminished at  left base GI: Soft, nontender, non-distended  MS: No edema; No deformity. Neuro:  Nonfocal  Psych: Normal affect   Labs    High Sensitivity Troponin:  No results for input(s): "TROPONINIHS" in the last 720 hours.   Chemistry Recent Labs  Lab 08/31/22 1146 09/01/22 0157 09/02/22 0446 09/03/22 0405  NA 135 132* 136 140  K 3.8 3.0* 3.0* 3.7  CL 101 103 104 105  CO2 '24 22 24 25  '$ GLUCOSE 185* 120* 146* 113*  BUN '17 12 13 10  '$ CREATININE 0.93 0.64 0.59* 0.61  CALCIUM 10.9* 10.5* 10.3 10.3  MG  --   --  1.7  --   PROT 7.6 7.2  --   --   ALBUMIN 2.8* 2.7*  --   --   AST 22 15  --   --   ALT 14 13  --   --   ALKPHOS 129* 116  --   --   BILITOT 1.0 0.6  --   --   GFRNONAA >60 >60 >60 >60  ANIONGAP '10 7 8 10    '$ Lipids No results for input(s): "CHOL", "TRIG", "HDL", "LABVLDL", "LDLCALC", "CHOLHDL" in the last 168 hours.  Hematology Recent Labs  Lab 09/01/22 0157 09/02/22 0446 09/03/22 0405  WBC 9.8 11.4* 10.9*  RBC 3.66* 3.79* 3.90*  HGB 10.2* 10.7* 11.1*  HCT 33.8* 34.8* 37.1*  MCV 92.3 91.8 95.1  MCH 27.9 28.2 28.5  MCHC 30.2 30.7 29.9*  RDW 15.7* 15.9* 15.9*  PLT 299 294 306   Thyroid No results for input(s): "TSH", "FREET4" in the last 168 hours.  BNP Recent Labs  Lab 08/31/22 1146  BNP 455.8*    DDimer No results for input(s): "DDIMER" in the last 168 hours.   Radiology    DG CHEST PORT 1 VIEW  Result Date: 09/03/2022 CLINICAL DATA:  Shortness of breath. EXAM: PORTABLE CHEST 1 VIEW COMPARISON:  09/01/2022 FINDINGS: Large left pleural effusion with underlying pulmonary opacity. Diffuse interstitial coarsening primarily attributed to emphysema. Findings are underestimated relative to recent CT. Normal heart size where not obscured. No pneumothorax IMPRESSION: 1. Unchanged large left pleural effusion and pulmonary opacity. 2. Advanced emphysema. Electronically Signed   By: Jorje Guild M.D.   On: 09/03/2022 08:18   ECHOCARDIOGRAM LIMITED  Result Date:  09/02/2022    ECHOCARDIOGRAM LIMITED REPORT   Patient Name:   Roger [name] Date of Exam: 09/02/2022 Medical Rec #:  [id number]   Height:       71.0 in Accession #:    [id number]  Weight:       127.9 lb Date of Birth:  1956-01-31  BSA:          1.743 m Patient Age:    67 years    BP:           100/72 mmHg Patient Gender: M           HR:           74 bpm. Exam Location:  [facility] Procedure: Limited Echo, Cardiac Doppler and Limited Color Doppler Indications:    Congestive Heart Failure I50.9  History:        Patient has prior history of Echocardiogram examinations, most                 recent 06/01/2022. Signs/Symptoms:Shortness of Breath; Risk                 Factors:Former Smoker and Hypertension.  Sonographer:    Greer Pickerel Referring Phys: [id number] SARA-MAIZ A THOMAS  Sonographer Comments: Image acquisition challenging due to patient body habitus and Image acquisition challenging due to respiratory motion. IMPRESSIONS  1. Left ventricular ejection fraction, by estimation, is 45 to 50%. The left ventricle has mildly decreased function. Left ventricular diastolic parameters are indeterminate.  2. Right ventricular systolic function mild to moderately reduced. The right ventricular size is moderately enlarged. Tricuspid regurgitation signal is inadequate for assessing PA pressure.  3. Large pleural effusion in the left lateral region.  4. No evidence of mitral valve regurgitation.  5. Aortic valve regurgitation is not visualized.  6. The inferior vena cava is dilated in size with >50% respiratory variability, suggesting right atrial pressure of 8 mmHg. Comparison(s): Pericardial effusion has improved, plerual effusion is new from prior. FINDINGS  Left Ventricle: Left ventricular ejection fraction, by estimation, is 45 to 50%. The left ventricle has mildly decreased function. The left ventricular internal cavity size was normal in size. There is no left ventricular hypertrophy. Left ventricular diastolic parameters are  indeterminate. Right Ventricle: The right ventricular size is moderately enlarged. Right ventricular systolic function mild to moderately reduced. Tricuspid regurgitation signal is inadequate for assessing PA pressure. Pericardium: Trivial pericardial effusion is present. The pericardial effusion is surrounding the apex. There is excessive respiratory variation in septal movement. Aortic Valve: Aortic valve regurgitation is not visualized. Aorta: The aortic root is normal in size and structure. Venous: The inferior vena cava is dilated in size with greater than 50% respiratory variability, suggesting right atrial pressure of 8 mmHg. Additional Comments: There is a large pleural effusion in the left lateral region. Spectral Doppler performed. Color Doppler performed.  LEFT VENTRICLE PLAX 2D LVIDd:         4.70 cm     Diastology LVIDs:         3.70 cm     LV e' medial:   7.29 cm/s LV PW:         0.70 cm     LV E/e' medial: 5.6 LV IVS:        0.60 cm LVOT diam:     2.20 cm LVOT Area:     3.80 cm  LV Volumes (MOD) LV vol d, MOD A2C: 71.8 ml LV vol d, MOD A4C: 70.9 ml LV vol s, MOD A2C: 42.2 ml LV vol s, MOD A4C: 32.9 ml LV SV MOD A2C:     29.6 ml LV SV MOD A4C:     70.9 ml LV SV MOD BP:      35.3 ml RIGHT VENTRICLE RV S prime:     7.07 cm/s TAPSE (M-mode): 0.8 cm LEFT ATRIUM         Index LA diam:    3.70 cm 2.12 cm/m   AORTA Ao Root diam: 3.45 cm MITRAL VALVE MV Area (PHT): 4.36 cm    SHUNTS MV Decel Time: 174 msec    Systemic Diam: 2.20 cm MV E velocity: 41.10 cm/s MV A velocity: 68.60 cm/s MV E/A ratio:  0.60 Rudean Haskell MD Electronically signed by Rudean Haskell MD Signature Date/Time: 09/02/2022/4:19:24 PM    Final    US THORACENTESIS ASP PLEURAL SPACE W/IMG GUIDE  Result Date: 09/01/2022 INDICATION: Left pleural effusion EXAM: ULTRASOUND GUIDED LEFT therapeutic THORACENTESIS MEDICATIONS: 5 cc 1% lidocaine COMPLICATIONS: None immediate. PROCEDURE: An ultrasound guided thoracentesis was  thoroughly discussed with the patient and questions answered. The benefits, risks, alternatives and complications were also discussed. The patient understands and wishes to proceed with the procedure. Written consent was obtained. Ultrasound was performed to localize and Sophie an adequate pocket of fluid in the left chest. The area was then prepped and draped in the normal sterile fashion. 1% Lidocaine was used for local anesthesia. Under ultrasound guidance a 6 Fr Safe-T-Centesis catheter was introduced. Thoracentesis was performed. The catheter was removed and a dressing applied. FINDINGS: A total of approximately 1 L of amber fluid was removed. Ordering provider did not request laboratory samples. IMPRESSION: Successful ultrasound guided left thoracentesis yielding 1 L of pleural fluid. Follow-up chest x-ray revealed no evidence of pneumothorax. Read by: Reatha Armour, PA-C Electronically Signed   By: Corrie Mckusick D.O.   On: 09/01/2022 12:40   DG CHEST PORT 1 VIEW  Result Date: 09/01/2022 CLINICAL DATA:  Post thoracentesis. EXAM: PORTABLE CHEST 1 VIEW COMPARISON:  Chest radiograph and CTA chest 1 day prior FINDINGS: The cardiomediastinal silhouette is grossly stable. There is persistent moderate-to-large left pleural effusion following thoracentesis, decreased in size with slightly improved aeration of the left lung.  The right lung is clear. There is no significant right effusion. There is no appreciable pneumothorax There is no acute osseous abnormality. IMPRESSION: Persistent moderate-to-large left pleural effusion following thoracentesis but with slightly improved aeration of the left lung. No appreciable pneumothorax. Electronically Signed   By: Valetta Mole M.D.   On: 09/01/2022 11:09    Cardiac Studies     Patient Profile     67 y.o. male with metastatic lung cancer, COPD, DVT, atrial fibrillation who we are consulted for evaluation of atrial fibrillation  Assessment & Plan     Paroxysmal  atrial fibrillation: History of atrial flutter during hospitalization for PE 05/2022.  Presented this admission with A-fib with RVR in setting of suspected pneumonia.  Converted to sinus rhythm 2/29 but then went back into A-fib.  An echo 09/02/2022 showed EF 45 to 50%, mild to moderate RV systolic dysfunction, -Heart rates elevated to 140s this morning.  Was in sinus rhythm for about 7 hours overnight but now back in A-fib.  Recommend starting amiodarone to try to maintain sinus rhythm as he recovers from his acute illness.  Will start amiodarone drip -Continue Eliquis -BP has been soft, will hold beta-blocker for now while starting amiodarone.  Plan to add low-dose metoprolol as tolerated given reduced systolic function  Acute systolic heart failure: EF 45 to 50% on echocardiogram 3/1.  Could be tachycardia induced from A-fib with RVR or due to sepsis.  Plan repeat echocardiogram once recovers from acute illness.  Plan to add metoprolol as BP tolerates.  No room for further GDMT at this time  Suspected pneumonia: On Unasyn  Large left pleural effusion: Status post thoracentesis 2/29.  Chest x-ray showed persistent moderate to large left pleural effusion following thoracentesis  Stage IV lung cancer: Received palliative radiation  DVT/PE: Diagnosed 05/2022.  On Eliquis  Goals of care: Poor prognosis, agree with palliative consult  For questions or updates, please contact [facility] Please consult [email or web address] for contact info under        Signed, Donato Heinz, MD  09/03/2022, 8:56 AM

## 2022-09-03 NOTE — Progress Notes (Addendum)
  Amiodarone Drug - Drug Interaction Consult Note  Recommendations: - Pt currently on atorvastatin 10 mg daily. Amiodarone may potentially increase atorvastatin levels. Monitor therapy.  - Pt on atenolol PTA. Currently on hold. Per cardio note "Plan to add low-dose metoprolol as tolerated given reduced systolic function "  Amiodarone is metabolized by the cytochrome P450 system and therefore has the potential to cause many drug interactions. Amiodarone has an average plasma half-life of 50 days (range 20 to 100 days).   There is potential for drug interactions to occur several weeks or months after stopping treatment and the onset of drug interactions may be slow after initiating amiodarone.   '[x]'$  Statins: Increased risk of myopathy. Simvastatin- restrict dose to '20mg'$  daily. Other statins: counsel patients to report any muscle pain or weakness immediately.  '[]'$  Anticoagulants: Amiodarone can increase anticoagulant effect. Consider warfarin dose reduction. Patients should be monitored closely and the dose of anticoagulant altered accordingly, remembering that amiodarone levels take several weeks to stabilize.  '[]'$  Antiepileptics: Amiodarone can increase plasma concentration of phenytoin, the dose should be reduced. Note that small changes in phenytoin dose can result in large changes in levels. Monitor patient and counsel on signs of toxicity.  '[]'$  Beta blockers: increased risk of bradycardia, AV block and myocardial depression. Sotalol - avoid concomitant use.  '[]'$   Calcium channel blockers (diltiazem and verapamil): increased risk of bradycardia, AV block and myocardial depression.  '[]'$   Cyclosporine: Amiodarone increases levels of cyclosporine. Reduced dose of cyclosporine is recommended.  '[]'$  Digoxin dose should be halved when amiodarone is started.  '[]'$  Diuretics: increased risk of cardiotoxicity if hypokalemia occurs.  '[]'$  Oral hypoglycemic agents (glyburide, glipizide, glimepiride):  increased risk of hypoglycemia. Patient's glucose levels should be monitored closely when initiating amiodarone therapy.   '[]'$  Drugs that prolong the QT interval:  Torsades de pointes risk may be increased with concurrent use - avoid if possible.  Monitor QTc, also keep magnesium/potassium WNL if concurrent therapy can't be avoided.  Antibiotics: e.g. fluoroquinolones, erythromycin.  Antiarrhythmics: e.g. quinidine, procainamide, disopyramide, sotalol.  Antipsychotics: e.g. phenothiazines, haloperidol.   Lithium, tricyclic antidepressants, and methadone. Thank You,   Royetta Asal, PharmD, BCPS 09/03/2022 10:51 AM

## 2022-09-03 NOTE — Procedures (Signed)
PROCEDURE SUMMARY:  Successful US guided left thoracentesis. Yielded 2.1 L of clear amber fluid. Patient tolerated procedure well. No immediate complications. EBL = trace Post procedure chest X-ray reveals no pneumothorax  Jvon Meroney S Briellah Baik PA-C 09/03/2022 2:00 PM

## 2022-09-04 DIAGNOSIS — J90 Pleural effusion, not elsewhere classified: Secondary | ICD-10-CM | POA: Diagnosis not present

## 2022-09-04 DIAGNOSIS — I4891 Unspecified atrial fibrillation: Secondary | ICD-10-CM | POA: Diagnosis not present

## 2022-09-04 DIAGNOSIS — I5021 Acute systolic (congestive) heart failure: Secondary | ICD-10-CM | POA: Diagnosis not present

## 2022-09-04 LAB — GLUCOSE, CAPILLARY
Glucose-Capillary: 120 mg/dL — ABNORMAL HIGH (ref 70–99)
Glucose-Capillary: 110 mg/dL — ABNORMAL HIGH (ref 70–99)
Glucose-Capillary: 97 mg/dL (ref 70–99)
Glucose-Capillary: 130 mg/dL — ABNORMAL HIGH (ref 70–99)
Glucose-Capillary: 107 mg/dL — ABNORMAL HIGH (ref 70–99)

## 2022-09-04 MED ORDER — POTASSIUM CHLORIDE CRYS ER 20 MEQ PO TBCR
40.0000 meq | EXTENDED_RELEASE_TABLET | Freq: Once | ORAL | Status: AC
  Administered 2022-09-04: 40 meq via ORAL
  Filled 2022-09-04: qty 2

## 2022-09-04 MED ORDER — MAGNESIUM OXIDE -MG SUPPLEMENT 400 (240 MG) MG PO TABS
400.0000 mg | ORAL_TABLET | Freq: Every day | ORAL | Status: AC
  Administered 2022-09-04 – 2022-09-07 (×4): 400 mg via ORAL
  Filled 2022-09-04 (×4): qty 1

## 2022-09-04 NOTE — Progress Notes (Signed)
Pt refused AM stool softeners, stating he is not constipated and he had a BM on Friday. Will continue to monitor. MD aware.

## 2022-09-04 NOTE — Progress Notes (Signed)
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  Subjective:   Remain on amiodarone drip for afib control, in and out of aifb  S/p 2nd  thoracentesis with liters removed on 3/2, ,reports felt better   he denies pain, no edema, no fever,   Family at bedside    Assessment & Plan:  Principal Problem:   Pleural effusion Active Problems:   Protein-calorie malnutrition, severe   Palliative care encounter   Metastatic malignant neoplasm ([place])   Goals of care, counseling/discussion   DNR (do not resuscitate)   SCC of lung (small cell carcinoma), left (HCC)   Pleural effusion, left   Acute hypoxic respiratory failure (HCC)   Persistent atrial fibrillation (HCC)   Secondary hypercoagulable state ([place])   Pressure injury of skin   Atrial fibrillation with RVR ([place])   Acute systolic heart failure (HCC)    Assessment and Plan:  Acute on chronic respiratory failure from large left side Pleural effusion   , report O2 as needed at home, in background of stage IV non-small cell lung cancer  -CT chest on presentation noting large left pleural effusion  associated with  mild rightward shift of cardiac and mediastinal structures. Suggestive of Malignant effusion . --s/p thoracentesis on 2/29 with 1liter removed,  fluids culture no growth, gram stain negative -s/p thoracentesis on 3/3 with 2liter removed -I have discussed with the patient that effusion is likely malignant effusion that most often will recur, he might need Pleurx placement for symptom relief if effusions recur quickly.  He expressed understanding -Plan to repeat chest x-ray to evaluate pleural effusion prior to discharge -Will need to go home on home O2   CAP vs Post obstructive PNA  with associated sepsis/lactic  acidosis -mrsa screening negative,  -Urine strep pneumo negative , does not have much cough , blood culture no growth    - change vanc/cefepime to unasyn to finish total of 7 days abx treatment   Hypotension Bp 77/51 on presentation, has improved some but still borderline low am random cortisol level 19 Hold atenolol  May need  midodrine  Lactic acidosis -multifactorial,infection / hypoxemia  -resolved  Afib, Per chart review he was diagnosed with A-fib in December 2023 -Already on  Eliquis, continue  -hold atenolol due to borderline bp, started on amiodarone drip on 3/2 per cards recommendation, F/u cardiology recommendation  DVT /PE, diagnosed in 05/31/2022 Continue Eliquis  Hypokalemia/hypomagnesemia, replace K and mag, repeat in the morning  Constipation Start stool softener  Stage IV non-small cell lung cancer,  squamous cell carcinoma presented with bilateral pulmonary masses and nodules as well as mediastinal and bilateral hilar adenopathy in addition to subcutaneous metastatic disease and bone metastasis diagnosed in October 2023 with PD-L1 expression of 1%.    The patient is status post palliative radiotherapy to the painful abdominal lesion under the care of Dr. Sondra Come.  systemic chemotherapy with carboplatin for AUC of 5, paclitaxel 175 Mg/M2 and Keytruda 200 Mg IV every 3 weeks with Neulasta support status post 1 cycle which was given on May 09, 2022 , appeared that was the only chemo he received so far Oncology Dr Julien Nordmann  on treatment team list  FTT, poor prognosis, palliative care consulted for goals of care  discussion   Nutritional Assessment: The patient's BMI is: Body mass index is 18.57 kg/m.Marland Kitchen Seen by dietician.  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   Skin Assessment: I have examined the patient's skin and I agree with the wound assessment as performed by the wound care RN as outlined below: Pressure Injury 08/31/22 Sacrum Stage 2 -  Partial thickness loss of dermis presenting as a shallow open injury with a red, pink wound bed without slough. red, open (Active)  08/31/22 1752  Location: Sacrum  Location Orientation:   Staging: Stage 2 -  Partial thickness loss of dermis presenting as a shallow open injury with a red, pink wound bed without slough.  Wound Description (Comments): red, open  Present on Admission: Yes  Dressing Type Foam - Lift dressing to assess site every shift 09/03/22 1023     I have Reviewed nursing notes, Vitals, pain scores, I/o's, Lab results and  imaging results since pt's last encounter, details please see discussion above  I ordered the following labs:  Unresulted Labs (From admission, onward)     Start     Ordered   09/05/22 0500  CBC  Tomorrow morning,   R        09/04/22 0831   09/05/22 [id number]  Basic metabolic panel  Tomorrow morning,   R        09/04/22 0831   09/05/22 0500  Magnesium  Tomorrow morning,   R        09/04/22 0831   09/01/22 1222  Total bilirubin, body fluid  RELEASE UPON ORDERING,   TIMED        09/01/22 1222   08/31/22 2244  Legionella Pneumophila Serogp 1 Ur Ag  Once,   R        08/31/22 2244   08/31/22 2244  Expectorated Sputum Assessment w Gram Stain, Rflx to Resp Cult  Once,   R        08/31/22 2244             DVT prophylaxis:  apixaban (ELIQUIS) tablet 5 mg   Code Status:   Code Status: DNR  Family Communication: Reported HPOA which his sister in law, he declined my offer to call his family Nephew in room on 3/3, updated with his  permission  Disposition:    Dispo: The patient is from: home, lives with his sister              Anticipated d/c is to: home              Anticipated d/c date is:  pending heart rate control, need repeat cxr prior to discharge to decide on pleurx drain   Antimicrobials:    Anti-infectives (From admission, onward)    Start     Dose/Rate Route Frequency Ordered Stop   09/03/22 2000  Ampicillin-Sulbactam (UNASYN) 3 g in sodium chloride 0.9 % 100 mL IVPB        3 g 200 mL/hr over 30 Minutes Intravenous Every 6 hours 09/03/22 1403 09/09/22 0159   09/01/22 2000  Ampicillin-Sulbactam (UNASYN) 3 g in sodium chloride 0.9 %  100 mL IVPB  Status:  Discontinued        3 g 200 mL/hr over 30 Minutes Intravenous Every 6 hours 09/01/22 1817 09/03/22 1327   09/01/22 1000  vancomycin (VANCOREADY) IVPB 1250 mg/250 mL  Status:  Discontinued        1,250 mg 166.7 mL/hr over 90 Minutes Intravenous Every 24 hours 08/31/22 1710 09/01/22 1248   08/31/22 2200  ceFEPIme (MAXIPIME) 2 g in sodium chloride 0.9 % 100 mL IVPB  Status:  Discontinued        2 g 200 mL/hr over 30 Minutes Intravenous Every 8 hours 08/31/22 1652 09/01/22 1817   08/31/22 1330  vancomycin (VANCOCIN) IVPB 1000 mg/200 mL premix        1,000 mg 200 mL/hr over 60 Minutes Intravenous  Once 08/31/22 1316 08/31/22 1553   08/31/22 1315  ceFEPIme (MAXIPIME) 2 g in sodium chloride 0.9 % 100 mL IVPB        2 g 200 mL/hr over 30 Minutes Intravenous  Once 08/31/22 1306 08/31/22 1448          Objective: Vitals:   09/03/22 1853 09/03/22 1957 09/04/22 0356 09/04/22 0824  BP: 105/65 1'04/68 98/70 98/75 '$  Pulse: 97 71 99 (!) 105  Resp:  '18 17 20  '$ Temp:  97.7 F (36.5 C) 98.2 F (36.8 C) 98.2 F (36.8 C)  TempSrc:  Oral Oral   SpO2:  97% 95% 95%  Weight:      Height:        Intake/Output Summary (Last 24 hours) at 09/04/2022 0831 Last data filed at 09/04/2022 0700 Gross per 24 hour  Intake 1264.88 ml  Output 825 ml  Net 439.88 ml    Filed Weights   09/02/22 0415 09/03/22 0416 09/03/22 0500  Weight: 58 kg 59.1 kg 60.4 kg    Examination:  General exam: AAOx3, thin and frail, chronic ill-appearing Respiratory system: Diminished, no wheezing, no rales, no rhonchi. Respiratory effort normal. Cardiovascular system:  IRRR.  Gastrointestinal system: Abdomen is nondistended, soft and nontender.  Normal bowel sounds heard. Central nervous system: Alert and oriented. No focal neurological deficits. Extremities:  no edema Skin: No rashes, lesions or ulcers Psychiatry: Calm and cooperative, appear having a hard time dealing with cancer diagnosis, report has been always healthy in the past    Data Reviewed: I have personally reviewed  labs and visualized  imaging studies since the last encounter and formulate the plan        Scheduled Meds:  ALPRAZolam  1 mg Oral QID   apixaban  5 mg Oral BID   atorvastatin  10 mg Oral QHS   feeding supplement  237 mL Oral BID BM   insulin aspart  0-6 Units Subcutaneous Q4H   magnesium oxide  400 mg Oral Daily   mirtazapine  15 mg Oral QHS   polyethylene glycol  17 g Oral Daily   potassium chloride  40 mEq Oral Once   Ensure Max Protein  11 oz Oral Daily   saccharomyces boulardii  250 mg Oral BID   senna-docusate  1 tablet Oral BID   Continuous Infusions:  amiodarone 30 mg/hr (09/04/22 0703)   ampicillin-sulbactam (UNASYN) IV 3 g (09/04/22 0203)     LOS: 4 days   Time spent:  57mns  FFlorencia Reasons MD PhD FACP [facility]  Available via Epic secure chat 7am-7pm for nonurgent issues Please page for urgent issues To page the attending provider between 7A-7P or the covering provider  during after hours 7P-7A, please log into the web site [email or web address] and access using universal [place] password for that web site. If you do not have the password, please call the hospital operator.    09/04/2022, 8:31 AM

## 2022-09-04 NOTE — Progress Notes (Signed)
  Daily Progress Note   Patient Name: Roger [name]       Date: 09/04/2022 DOB: 1955/11/30  Age: 67 y.o. MRN#: [id number] Attending Physician: Florencia Reasons, MD Primary Care Physician: Aletha Halim., PA-C Admit Date: 08/31/2022 Length of Stay: 4 days  Attempted to visit with patient today. When trying to see later on in the day, he was sleeping comfortably. Did not want to awaken him from sleep to allow rest. PMT will continue to follow along with patient at this time.  Chelsea Aus, DO Palliative Care Provider PMT # [phone]

## 2022-09-04 NOTE — Progress Notes (Signed)
Rounding Note    Patient Name: Roger [name] Date of Encounter: 09/04/2022  [place] Cardiologist: None   Subjective   BP 98/70.  Underwent thoracentesis yesterday with 2.1L removed.  Reports dyspnea improved  Inpatient Medications    Scheduled Meds:  ALPRAZolam  1 mg Oral QID   apixaban  5 mg Oral BID   atorvastatin  10 mg Oral QHS   feeding supplement  237 mL Oral BID BM   insulin aspart  0-6 Units Subcutaneous Q4H   mirtazapine  15 mg Oral QHS   polyethylene glycol  17 g Oral Daily   Ensure Max Protein  11 oz Oral Daily   saccharomyces boulardii  250 mg Oral BID   senna-docusate  1 tablet Oral BID   Continuous Infusions:  amiodarone 30 mg/hr (09/04/22 0703)   ampicillin-sulbactam (UNASYN) IV 3 g (09/04/22 0203)   PRN Meds: acetaminophen, albuterol, ALPRAZolam, ondansetron **OR** ondansetron (ZOFRAN) IV, prochlorperazine   Vital Signs    Vitals:   09/03/22 1426 09/03/22 1853 09/03/22 1957 09/04/22 0356  BP: (!) 85/55 105/65 104/68 98/70  Pulse:  97 71 99  Resp:   18 17  Temp:   97.7 F (36.5 C) 98.2 F (36.8 C)  TempSrc:   Oral Oral  SpO2:   97% 95%  Weight:      Height:        Intake/Output Summary (Last 24 hours) at 09/04/2022 0810 Last data filed at 09/04/2022 0700 Gross per 24 hour  Intake 1264.88 ml  Output 825 ml  Net 439.88 ml       09/03/2022    5:00 AM 09/03/2022    4:16 AM 09/02/2022    4:15 AM  Last 3 Weights  Weight (lbs) 133 lb 2.5 oz 130 lb 4.7 oz 127 lb 13.9 oz  Weight (kg) 60.4 kg 59.1 kg 58 kg      Telemetry    In sinus rhythm intermittently overnight but Afib currently 80-90s;- Personally Reviewed  ECG    No new ECG- Personally Reviewed  Physical Exam   GEN: No acute distress.   Neck: No JVD Cardiac: Irregular, normal rate no murmurs Respiratory: Diminished at left base GI: Soft, nontender, non-distended  MS: No edema; No deformity. Neuro:  Nonfocal  Psych: Normal affect   Labs    High Sensitivity  Troponin:  No results for input(s): "TROPONINIHS" in the last 720 hours.   Chemistry Recent Labs  Lab 08/31/22 1146 09/01/22 0157 09/02/22 0446 09/03/22 0405  NA 135 132* 136 140  K 3.8 3.0* 3.0* 3.7  CL 101 103 104 105  CO2 '24 22 24 25  '$ GLUCOSE 185* 120* 146* 113*  BUN '17 12 13 10  '$ CREATININE 0.93 0.64 0.59* 0.61  CALCIUM 10.9* 10.5* 10.3 10.3  MG  --   --  1.7  --   PROT 7.6 7.2  --   --   ALBUMIN 2.8* 2.7*  --   --   AST 22 15  --   --   ALT 14 13  --   --   ALKPHOS 129* 116  --   --   BILITOT 1.0 0.6  --   --   GFRNONAA >60 >60 >60 >60  ANIONGAP '10 7 8 10     '$ Lipids No results for input(s): "CHOL", "TRIG", "HDL", "LABVLDL", "LDLCALC", "CHOLHDL" in the last 168 hours.  Hematology Recent Labs  Lab 09/01/22 0157 09/02/22 0446 09/03/22 0405  WBC 9.8 11.4* 10.9*  RBC 3.66* 3.79* 3.90*  HGB 10.2* 10.7* 11.1*  HCT 33.8* 34.8* 37.1*  MCV 92.3 91.8 95.1  MCH 27.9 28.2 28.5  MCHC 30.2 30.7 29.9*  RDW 15.7* 15.9* 15.9*  PLT 299 294 306    Thyroid No results for input(s): "TSH", "FREET4" in the last 168 hours.  BNP Recent Labs  Lab 08/31/22 1146  BNP 455.8*     DDimer No results for input(s): "DDIMER" in the last 168 hours.   Radiology    DG Chest 1 View  Result Date: 09/03/2022 CLINICAL DATA:  Pleural effusion.  Status post thoracentesis. EXAM: CHEST  1 VIEW COMPARISON:  Chest x-ray September 03, 2022 FINDINGS: No pneumothorax after thoracentesis. The left-sided pleural effusion remains but is smaller. The opacity underlying the left pleural effusion is again identified, more conspicuous in the interval given the smaller effusion. Reticular changes in the right lung base are stable. No other interval changes. IMPRESSION: 1. No pneumothorax after thoracentesis. 2. The left pleural effusion is smaller. 3. The opacity underlying the left pleural effusion is more conspicuous in the interval given the smaller effusion. 4. Reticular changes in the right lung base are stable.  Electronically Signed   By: Dorise Bullion III M.D.   On: 09/03/2022 14:42   DG CHEST PORT 1 VIEW  Result Date: 09/03/2022 CLINICAL DATA:  Shortness of breath. EXAM: PORTABLE CHEST 1 VIEW COMPARISON:  09/01/2022 FINDINGS: Large left pleural effusion with underlying pulmonary opacity. Diffuse interstitial coarsening primarily attributed to emphysema. Findings are underestimated relative to recent CT. Normal heart size where not obscured. No pneumothorax IMPRESSION: 1. Unchanged large left pleural effusion and pulmonary opacity. 2. Advanced emphysema. Electronically Signed   By: Jorje Guild M.D.   On: 09/03/2022 08:18   ECHOCARDIOGRAM LIMITED  Result Date: 09/02/2022    ECHOCARDIOGRAM LIMITED REPORT   Patient Name:   Roger [name] Date of Exam: 09/02/2022 Medical Rec #:  [id number]   Height:       71.0 in Accession #:    [id number]  Weight:       127.9 lb Date of Birth:  05-Aug-1955  BSA:          1.743 m Patient Age:    72 years    BP:           100/72 mmHg Patient Gender: M           HR:           74 bpm. Exam Location:  [facility] Procedure: Limited Echo, Cardiac Doppler and Limited Color Doppler Indications:    Congestive Heart Failure I50.9  History:        Patient has prior history of Echocardiogram examinations, most                 recent 06/01/2022. Signs/Symptoms:Shortness of Breath; Risk                 Factors:Former Smoker and Hypertension.  Sonographer:    Greer Pickerel Referring Phys: [id number] SARA-MAIZ A THOMAS  Sonographer Comments: Image acquisition challenging due to patient body habitus and Image acquisition challenging due to respiratory motion. IMPRESSIONS  1. Left ventricular ejection fraction, by estimation, is 45 to 50%. The left ventricle has mildly decreased function. Left ventricular diastolic parameters are indeterminate.  2. Right ventricular systolic function mild to moderately reduced. The right ventricular size is moderately enlarged. Tricuspid regurgitation signal is inadequate  for assessing PA pressure.  3. Large pleural effusion in the left  lateral region.  4. No evidence of mitral valve regurgitation.  5. Aortic valve regurgitation is not visualized.  6. The inferior vena cava is dilated in size with >50% respiratory variability, suggesting right atrial pressure of 8 mmHg. Comparison(s): Pericardial effusion has improved, plerual effusion is new from prior. FINDINGS  Left Ventricle: Left ventricular ejection fraction, by estimation, is 45 to 50%. The left ventricle has mildly decreased function. The left ventricular internal cavity size was normal in size. There is no left ventricular hypertrophy. Left ventricular diastolic parameters are indeterminate. Right Ventricle: The right ventricular size is moderately enlarged. Right ventricular systolic function mild to moderately reduced. Tricuspid regurgitation signal is inadequate for assessing PA pressure. Pericardium: Trivial pericardial effusion is present. The pericardial effusion is surrounding the apex. There is excessive respiratory variation in septal movement. Aortic Valve: Aortic valve regurgitation is not visualized. Aorta: The aortic root is normal in size and structure. Venous: The inferior vena cava is dilated in size with greater than 50% respiratory variability, suggesting right atrial pressure of 8 mmHg. Additional Comments: There is a large pleural effusion in the left lateral region. Spectral Doppler performed. Color Doppler performed.  LEFT VENTRICLE PLAX 2D LVIDd:         4.70 cm     Diastology LVIDs:         3.70 cm     LV e' medial:   7.29 cm/s LV PW:         0.70 cm     LV E/e' medial: 5.6 LV IVS:        0.60 cm LVOT diam:     2.20 cm LVOT Area:     3.80 cm  LV Volumes (MOD) LV vol d, MOD A2C: 71.8 ml LV vol d, MOD A4C: 70.9 ml LV vol s, MOD A2C: 42.2 ml LV vol s, MOD A4C: 32.9 ml LV SV MOD A2C:     29.6 ml LV SV MOD A4C:     70.9 ml LV SV MOD BP:      35.3 ml RIGHT VENTRICLE RV S prime:     7.07 cm/s TAPSE  (M-mode): 0.8 cm LEFT ATRIUM         Index LA diam:    3.70 cm 2.12 cm/m   AORTA Ao Root diam: 3.45 cm MITRAL VALVE MV Area (PHT): 4.36 cm    SHUNTS MV Decel Time: 174 msec    Systemic Diam: 2.20 cm MV E velocity: 41.10 cm/s MV A velocity: 68.60 cm/s MV E/A ratio:  0.60 Rudean Haskell MD Electronically signed by Rudean Haskell MD Signature Date/Time: 09/02/2022/4:19:24 PM    Final     Cardiac Studies     Patient Profile     67 y.o. male with metastatic lung cancer, COPD, DVT, atrial fibrillation who we are consulted for evaluation of atrial fibrillation  Assessment & Plan     Paroxysmal atrial fibrillation: History of atrial flutter during hospitalization for PE 05/2022.  Presented this admission with A-fib with RVR in setting of suspected pneumonia.  Converted to sinus rhythm 2/29 but then went back into A-fib.  Echo 09/02/2022 showed EF 45 to 50%, mild to moderate RV systolic dysfunction, -In Afib with RVR to 140s 3/2.  Started amio gtt.  In and out of sinus rhythm overnight.  Currently in A-fib with rates controlled.  Would continue IV amiodarone for today.  Hopefully as more amiodarone is loaded will maintain sinus rhythm -Continue Eliquis -BP has been soft, will hold beta-blocker for  now.  Plan to add low-dose metoprolol as tolerated given reduced systolic function  Acute systolic heart failure: EF 45 to 50% on echocardiogram 3/1.  Could be tachycardia induced from A-fib with RVR or due to sepsis.  Plan repeat echocardiogram once recovers from acute illness.  Plan to add metoprolol as BP tolerates.  No room for further GDMT at this time  Suspected pneumonia: On Unasyn  Large left pleural effusion: Status post thoracentesis 2/29 with 1.0L removed.  Chest x-ray post thoracentesis showed persistent moderate to large left pleural effusion.   Underwent repeat thoracentesis 3/2 with 2.1L removed  Stage IV lung cancer: Received palliative radiation  DVT/PE: Diagnosed 05/2022.  On  Eliquis  Goals of care: Poor prognosis, agree with palliative consult  For questions or updates, please contact [place] Please consult [email or web address] for contact info under        Signed, Donato Heinz, MD  09/04/2022, 8:10 AM

## 2022-09-05 ENCOUNTER — Encounter (HOSPITAL_COMMUNITY): Payer: Self-pay | Admitting: Internal Medicine

## 2022-09-05 ENCOUNTER — Inpatient Hospital Stay (HOSPITAL_COMMUNITY): Payer: BC Managed Care – PPO

## 2022-09-05 DIAGNOSIS — J90 Pleural effusion, not elsewhere classified: Secondary | ICD-10-CM | POA: Diagnosis not present

## 2022-09-05 DIAGNOSIS — C3492 Malignant neoplasm of unspecified part of left bronchus or lung: Secondary | ICD-10-CM | POA: Diagnosis not present

## 2022-09-05 DIAGNOSIS — I5021 Acute systolic (congestive) heart failure: Secondary | ICD-10-CM | POA: Diagnosis not present

## 2022-09-05 DIAGNOSIS — Z515 Encounter for palliative care: Secondary | ICD-10-CM | POA: Diagnosis not present

## 2022-09-05 LAB — GLUCOSE, CAPILLARY
Glucose-Capillary: 101 mg/dL — ABNORMAL HIGH (ref 70–99)
Glucose-Capillary: 104 mg/dL — ABNORMAL HIGH (ref 70–99)
Glucose-Capillary: 109 mg/dL — ABNORMAL HIGH (ref 70–99)
Glucose-Capillary: 162 mg/dL — ABNORMAL HIGH (ref 70–99)
Glucose-Capillary: 88 mg/dL (ref 70–99)
Glucose-Capillary: 92 mg/dL (ref 70–99)
Glucose-Capillary: 95 mg/dL (ref 70–99)

## 2022-09-05 LAB — BASIC METABOLIC PANEL
Anion gap: 6 (ref 5–15)
BUN: 7 mg/dL — ABNORMAL LOW (ref 8–23)
CO2: 28 mmol/L (ref 22–32)
Calcium: 10.1 mg/dL (ref 8.9–10.3)
Chloride: 98 mmol/L (ref 98–111)
Creatinine, Ser: 0.7 mg/dL (ref 0.61–1.24)
GFR, Estimated: >60 mL/min (ref >60)
Glucose, Bld: 109 mg/dL — ABNORMAL HIGH (ref 70–99)
Potassium: 4.5 mmol/L (ref 3.5–5.1)
Sodium: 132 mmol/L — ABNORMAL LOW (ref 135–145)

## 2022-09-05 LAB — MAGNESIUM: Magnesium: 1.7 mg/dL (ref 1.7–2.4)

## 2022-09-05 LAB — BODY FLUID CULTURE W GRAM STAIN: Culture: NO GROWTH

## 2022-09-05 LAB — CULTURE, BLOOD (ROUTINE X 2)
Culture: NO GROWTH
Culture: NO GROWTH

## 2022-09-05 LAB — CBC
HCT: 38 % — ABNORMAL LOW (ref 39.0–52.0)
Hemoglobin: 11.5 g/dL — ABNORMAL LOW (ref 13.0–17.0)
MCH: 27.8 pg (ref 26.0–34.0)
MCHC: 30.3 g/dL (ref 30.0–36.0)
MCV: 91.8 fL (ref 80.0–100.0)
Platelets: 287 10*3/uL (ref 150–400)
RBC: 4.14 MIL/uL — ABNORMAL LOW (ref 4.22–5.81)
RDW: 15.9 % — ABNORMAL HIGH (ref 11.5–15.5)
WBC: 10.4 10*3/uL (ref 4.0–10.5)
nRBC: 0 % (ref 0.0–0.2)

## 2022-09-05 LAB — LEGIONELLA PNEUMOPHILA SEROGP 1 UR AG: L. pneumophila Serogp 1 Ur Ag: NEGATIVE

## 2022-09-05 LAB — CYTOLOGY - NON PAP

## 2022-09-05 MED ORDER — MIDODRINE HCL 5 MG PO TABS
2.5000 mg | ORAL_TABLET | Freq: Three times a day (TID) | ORAL | Status: DC
  Administered 2022-09-05 – 2022-09-11 (×18): 2.5 mg via ORAL
  Filled 2022-09-05 (×18): qty 1

## 2022-09-05 NOTE — Progress Notes (Addendum)
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 He was brought to the ED hypotensive 77/51, chest x-ray chronic interstitial disease persistent left lung opacities EKG with ?A-fib junctional escape.  Labs with mild leukocytosis anemia CT PE no PE, pulmonary stool favoring constipation, previous hypermetabolic left anterior abdominal wall mass has reduced in size large left pleural effusion, malignant effusion is a distinct possibility, drowned lung appearance of the left lung with complete consolidation and some volume loss in the left lower lobe and passive atelectasis, the cavitary mass in the left upper lobe and left lower lobes are roughly similar to 05/31/2022, mild hazy interstitial accentuation in both lungs along with faint groundglass opacities potentially from edema or atypical pneumonia. Patient was admitted for acute on chronic respiratory failure with left sided pleural effusion large with background stage IV non-small cell lung cancer, CAP versus postobstructive pneumonia, hypotension lactic acidosis electrolyte imbalance   Signs/Symptoms: severe fat depletion, severe muscle depletion, percent weight loss (24% in 7 months) Percent weight loss: 24 % (in 7 months) Interventions: Ensure Enlive (each supplement provides 350kcal and 20 grams of protein), Prostat, Refer to RD note for recommendations    Subjective: Seen and examined this morning about to have his meal alert awake no complaint Overnight afebrile Transient hypotension ~89/69 during early morning Labs with mild hyponatremia otherwise fairly stable CBC BMP  Assessment and Plan: Principal  Problem:   Pleural effusion Active Problems:   Protein-calorie malnutrition, severe   Palliative care encounter   Metastatic malignant neoplasm ([place])   Goals of care, counseling/discussion   DNR (do not resuscitate)   SCC of lung (small cell carcinoma), left (HCC)   Pleural effusion, left   Acute hypoxic respiratory failure (HCC)   Persistent atrial fibrillation (HCC)   Secondary hypercoagulable state (HCC)   Pressure injury of skin   Atrial fibrillation with RVR (HCC)   Acute systolic heart failure (HCC)   Acute hypoxic respiratory failure from large left side Pleural effusion on  O2 PRN at home, Stage IV non-small cell lung cancer: CT chest in the ED with large left pleural effusion, rightward shift of cardiac and mediastinal structure suggestive of malignant effusion. S/P thoracentesis on 2/29 with 1liter removed, fluids culture no growth, gram stain negative S/p thoracentesis on 3/3 with 2liter removed. Likely malignant, high chance of recurrence IR consulted, repeat chest x-ray morning in case he needs Pleurx catheter- await palliative care discussion.Will try to set him up for home oxygen.   CAP vs Post obstructive PNA  with associated sepsis/lactic acidosis:MRSA neg.Urine strep pneumo negative.  Bld Cx NGTD> Cont unasyn to finish total of 7 days abx treatment.    Hypotension transient> stable now,random cortisol level 19 cont to hold atenolol> start low-dose midodrine.    Lactic acidosis:Multifactorial resolved.  Atrial fibrillation diagnosed in December 2023 on Eliquis at home on hold, started on amiodarone drip on 3/2 per cards recommendation, F/u cardiology recommendation for po transition.   DVT /PE, diagnosed in 05/31/2022 cont eliquis.  Hypokalemia/hypomagnesemia improved. Recent Labs  Lab 08/31/22 1146 09/01/22 0157 09/02/22 0446 09/03/22 0405 09/05/22 0346  K 3.8 3.0* 3.0* 3.7 4.5  CALCIUM 10.9* 10.5* 10.3  10.3 10.1  MG  --   --  1.7  --  1.7  PHOS  --   --   2.9  --   --     Constipation: cont stool softener.   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   s/p palliative radiotherapy to the painful abdominal lesion under the care of Dr. Sondra Come. Systemic chemotherapy with carboplatin for AUC of 5, paclitaxel 175 Mg/M2 and Keytruda 200 Mg IV every 3 weeks with Neulasta support status post 1 cycle which was given on May 09, 2022 , appeared that was the only chemo he received so far. Oncology Dr Julien Nordmann  on treatment team list.   [id number] are consulted. Dr ,Julien Nordmann advised hospice as per his sister. Sister says after doing bronnch his doctor mentioned he likely has < 6 months. likely hospice at home if patient agrees- sister is open to it.  Failure to thrive with overall poor prognosis palliative care has been consulted currently DNR.   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 Pressure Injury 08/31/22 Sacrum Stage 2 -  Partial thickness loss of dermis presenting as a shallow open injury with a red, pink wound bed without slough. red, open (Active)  08/31/22 1752  Location: Sacrum  Location Orientation:   Staging: Stage 2 -  Partial thickness loss of dermis presenting as a shallow open injury with a red, pink wound bed without slough.  Wound Description (Comments): red, open  Present on  Admission: Yes  Dressing Type Foam - Lift dressing to assess site every shift 09/04/22 0800   DVT prophylaxis:  Code Status:   Code Status: DNR Family Communication: plan of care discussed with patient/sister on phone Patient status is:  inpatient  because of respiratory failure recurrent pleural effusion Level of care: Progressive   Dispo:The patient is from:Home.             Anticipated disposition: TBD-likely hospice at home if patient agrees- sister is open to it.Await palliative care.  Objective: Vitals last 24 hrs: Vitals:   09/04/22 2027 09/05/22 0418 09/05/22 0600 09/05/22 0909  BP: 90/68 (!) 89/69 106/84 107/77  Pulse: (!) 103 99  99  Resp: 20   20  Temp: 98.9 F (37.2 C) 97.7 F (36.5 C)  98 F (36.7 C)  TempSrc: Oral Oral  Oral  SpO2: 92% 95%  95%  Weight:  59.8 kg    Height:       Weight change:   Physical Examination: General exam: alert awake, team fairly cachectic older than stated age HEENT:Oral mucosa moist, Ear/Nose WNL grossly Respiratory system: bilaterally DIMINISHED  BS, no use of accessory muscle Cardiovascular system: S1 & S2 +, No JVD. Gastrointestinal system: Abdomen soft,NT,ND, BS+ Nervous System:Alert, awake, moving extremities. Extremities: LE edema NEG,distal peripheral pulses palpable.  Skin: No rashes,no icterus. MSK: Normal muscle bulk,tone, power  Medications reviewed:  Scheduled Meds:  ALPRAZolam  1 mg Oral  QID   apixaban  5 mg Oral BID   atorvastatin  10 mg Oral QHS   feeding supplement  237 mL Oral BID BM   insulin aspart  0-6 Units Subcutaneous Q4H   magnesium oxide  400 mg Oral Daily   mirtazapine  15 mg Oral QHS   polyethylene glycol  17 g Oral Daily   Ensure Max Protein  11 oz Oral Daily   saccharomyces boulardii  250 mg Oral BID   senna-docusate  1 tablet Oral BID   Continuous Infusions:  amiodarone 30 mg/hr (09/05/22 0621)   ampicillin-sulbactam (UNASYN) IV 3 g (09/05/22 0902)   Diet Order             Diet Carb  Modified Fluid consistency: Thin; Room service appropriate? Yes  Diet effective now                   Intake/Output Summary (Last 24 hours) at 09/05/2022 0912 Last data filed at 09/05/2022 [id number] [place] per 24 hour  Intake 613.06 ml  Output 650 ml  Net -36.94 ml   Net IO Since Admission: 2,196.47 mL [09/05/22 0912]  Wt Readings from Last 3 Encounters:  09/05/22 59.8 kg  07/27/22 61.5 kg  06/21/22 65.8 kg     Unresulted Labs (From admission, onward)     Start     Ordered   09/05/22 0500  Hemoglobin A1c  Tomorrow morning,   R        09/04/22 1641   09/01/22 1222  Total bilirubin, body fluid  RELEASE UPON ORDERING,   TIMED        09/01/22 1222   08/31/22 2244  Legionella Pneumophila Serogp 1 Ur Ag  Once,   R        08/31/22 2244   08/31/22 2244  Expectorated Sputum Assessment w Gram Stain, Rflx to Resp Cult  Once,   R        08/31/22 2244          Data Reviewed: I have personally reviewed following labs and imaging studies CBC: Recent Labs  Lab 08/31/22 1146 09/01/22 0157 09/02/22 0446 09/03/22 0405 09/05/22 0346  WBC 13.0* 9.8 11.4* 10.9* 10.4  NEUTROABS 11.7*  --  9.5* 9.1*  --   HGB 11.1* 10.2* 10.7* 11.1* 11.5*  HCT 35.9* 33.8* 34.8* 37.1* 38.0*  MCV 91.3 92.3 91.8 95.1 91.8  PLT 346 299 294 306 [id number]   Basic Metabolic Panel: Recent Labs  Lab 08/31/22 1146 09/01/22 0157 09/02/22 0446 09/03/22 0405 09/05/22 0346  NA 135 132* 136 140 132*  K 3.8 3.0* 3.0* 3.7 4.5  CL 101 103 104 105 98  CO2 '24 22 24 25 28  '$ GLUCOSE 185* 120* 146* 113* 109*  BUN '17 12 13 10 '$ 7*  CREATININE 0.93 0.64 0.59* 0.61 0.70  CALCIUM 10.9* 10.5* 10.3 10.3 10.1  MG  --   --  1.7  --  1.7  PHOS  --   --  2.9  --   --    GFR: Estimated Creatinine Clearance: 77.9 mL/min (by C-G formula based on SCr of 0.7 mg/dL). Liver Function Tests: Recent Labs  Lab 08/31/22 1146 09/01/22 0157  AST 22 15  ALT 14 13  ALKPHOS 129* 116  BILITOT 1.0 0.6  PROT 7.6 7.2  ALBUMIN 2.8* 2.7*    Recent Labs  Lab 08/31/22 1146 09/01/22 0157  INR 2.1* 1.9*   Recent Labs  Lab 09/04/22 1633 09/04/22 2018 09/05/22 0057 09/05/22  [id number] 09/05/22 0752  GLUCAP 130* 107* 101* 104* 109*   Recent Labs  Lab 08/31/22 1146 08/31/22 1412 08/31/22 2312 09/01/22 0157  PROCALCITON  --   --  0.17  --   LATICACIDVEN 4.7* 2.7* 1.7 1.8    Recent Results (from the past 240 hour(s))  Blood Culture (routine x 2)     Status: None   Collection Time: 08/31/22 11:46 AM   Specimen: BLOOD RIGHT FOREARM  Result Value Ref Range Status   Specimen Description   Final    BLOOD RIGHT FOREARM Performed at [facility] [address], [place], [place] [address]    Special Requests   Final    BOTTLES DRAWN AEROBIC AND ANAEROBIC Blood Culture results may not be optimal due to an inadequate volume of blood received in culture bottles Performed at [facility] [address] [place], [place] [address]    Culture   Final    NO GROWTH 5 DAYS Performed at [facility], [place] [address], [place], [place] [address]    Report Status 09/05/2022 FINAL  Final  Blood Culture (routine x 2)     Status: None   Collection Time: 08/31/22 11:46 AM   Specimen: BLOOD LEFT FOREARM  Result Value Ref Range Status   Specimen Description   Final    BLOOD LEFT FOREARM Performed at [facility] [address], [place], [place] [address]    Special Requests   Final    BOTTLES DRAWN AEROBIC AND ANAEROBIC Blood Culture results may not be optimal due to an inadequate volume of blood received in culture bottles Performed at [facility] [address], [place], [place] [address]    Culture   Final    NO GROWTH 5 DAYS Performed at [facility], [place] [address] [place], [place] [address]    Report Status 09/05/2022 FINAL  Final  MRSA Next Gen by PCR, Nasal     Status: None   Collection Time: 09/01/22  7:58 AM   Specimen: Nasal  Mucosa; Nasal Swab  Result Value Ref Range Status   MRSA by PCR Next Gen NOT DETECTED NOT DETECTED Final    Comment: (NOTE) The GeneXpert MRSA Assay (FDA approved for NASAL specimens only), is one component of a comprehensive MRSA colonization surveillance program. It is not intended to diagnose MRSA infection nor to guide or monitor treatment for MRSA infections. Test performance is not FDA approved in patients less than 4 years old. Performed at [facility], [place] [address] [place], [place] [address]   Body fluid culture w Gram Stain     Status: None   Collection Time: 09/01/22 12:21 PM   Specimen: PATH Cytology Peritoneal fluid  Result Value Ref Range Status   Specimen Description   Final    PERITONEAL Performed at [facility], [place] [address] [place], [place] [address]    Special Requests   Final    NONE Performed at [facility], [place] [address] [place], [place] [address]    Gram Stain   Final    WBC PRESENT, PREDOMINANTLY MONONUCLEAR NO ORGANISMS SEEN CYTOSPIN SMEAR    Culture   Final    NO GROWTH Performed at [facility], [place] [address] [place], [place] [address]    Report Status 09/05/2022 FINAL  Final    Antimicrobials: Anti-infectives (From admission, onward)    Start  Dose/Rate Route Frequency Ordered Stop   09/03/22 2000  Ampicillin-Sulbactam (UNASYN) 3 g in sodium chloride 0.9 % 100 mL IVPB        3 g 200 mL/hr over 30 Minutes Intravenous Every 6 hours 09/03/22 1403 09/07/22 0759   09/01/22 2000  Ampicillin-Sulbactam (UNASYN) 3 g in sodium chloride 0.9 % 100 mL IVPB  Status:  Discontinued        3 g 200 mL/hr over 30 Minutes Intravenous Every 6 hours 09/01/22 1817 09/03/22 1327   09/01/22 1000  vancomycin (VANCOREADY) IVPB 1250 mg/250 mL  Status:  Discontinued        1,250 mg 166.7 mL/hr over 90 Minutes Intravenous Every 24 hours 08/31/22 1710 09/01/22 1248   08/31/22 2200  ceFEPIme (MAXIPIME) 2 g  in sodium chloride 0.9 % 100 mL IVPB  Status:  Discontinued        2 g 200 mL/hr over 30 Minutes Intravenous Every 8 hours 08/31/22 1652 09/01/22 1817   08/31/22 1330  vancomycin (VANCOCIN) IVPB 1000 mg/200 mL premix        1,000 mg 200 mL/hr over 60 Minutes Intravenous  Once 08/31/22 1316 08/31/22 1553   08/31/22 1315  ceFEPIme (MAXIPIME) 2 g in sodium chloride 0.9 % 100 mL IVPB        2 g 200 mL/hr over 30 Minutes Intravenous  Once 08/31/22 1306 08/31/22 1448      Culture/Microbiology    Component Value Date/Time   SDES  09/01/2022 1221    PERITONEAL Performed at [facility], [place] [address], [place], [place] [address]    SPECREQUEST  09/01/2022 1221    NONE Performed at [facility], [place] [address] [place], [place] [address]    CULT  09/01/2022 1221    NO GROWTH Performed at [facility] [address] [place], [place] [address]    REPTSTATUS 09/05/2022 FINAL 09/01/2022 1221  Other culture-see note  Radiology Studies: DG Chest 1 View  Result Date: 09/03/2022 CLINICAL DATA:  Pleural effusion.  Status post thoracentesis. EXAM: CHEST  1 VIEW COMPARISON:  Chest x-ray September 03, 2022 FINDINGS: No pneumothorax after thoracentesis. The left-sided pleural effusion remains but is smaller. The opacity underlying the left pleural effusion is again identified, more conspicuous in the interval given the smaller effusion. Reticular changes in the right lung base are stable. No other interval changes. IMPRESSION: 1. No pneumothorax after thoracentesis. 2. The left pleural effusion is smaller. 3. The opacity underlying the left pleural effusion is more conspicuous in the interval given the smaller effusion. 4. Reticular changes in the right lung base are stable. Electronically Signed   By: Dorise Bullion III M.D.   On: 09/03/2022 14:42     LOS: 5 days   Antonieta Pert, MD [facility]  09/05/2022, 9:12 AM

## 2022-09-05 NOTE — Hospital Course (Addendum)
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 He was brought to the ED hypotensive 77/51, chest x-ray chronic interstitial disease persistent left lung opacities EKG with ?A-fib junctional escape.  Labs with mild leukocytosis anemia CT PE no PE, pulmonary stool favoring constipation, previous hypermetabolic left anterior abdominal wall mass has reduced in size large left pleural effusion, malignant effusion is a distinct possibility, drowned lung appearance of the left lung with complete consolidation and some volume loss in the left lower lobe and passive atelectasis, the cavitary mass in the left upper lobe and left lower lobes are roughly similar to 05/31/2022, mild hazy interstitial accentuation in both lungs along with faint groundglass opacities potentially from edema or atypical pneumonia. Patient was admitted for acute on chronic respiratory failure with left sided pleural effusion large with background stage IV non-small cell lung cancer, CAP versus postobstructive pneumonia, hypotension lactic acidosis electrolyte imbalance   Signs/Symptoms: severe fat depletion, severe muscle depletion, percent weight loss (24% in 7 months) Percent weight loss: 24 % (in 7 months) Interventions: Ensure Enlive (each supplement provides 350kcal and 20 grams of protein), Prostat, Refer to RD note for recommendations

## 2022-09-05 NOTE — Care Management Important Message (Signed)
Important Message  Patient Details IM Letter given Name: Roger [name] MRN: [id number] Date of Birth: [birth year]   Medicare Important Message Given:  Yes     Kerin Salen 09/05/2022, 12:05 PM

## 2022-09-05 NOTE — Progress Notes (Signed)
Chaplain engaged in an initial visit with [employer]. He noted right away that he was feeling tired and in need of rest. Chaplain honored request and let him know she would check in later today. When Chaplain did mention healthcare POA, he noted, "I believe I still have time left or a ways to go." Chaplain assessed some fear around completing the document and worked to describe it as a measure to put in place rather than an aspect of his life expectancy. Chaplain offered support and compassionate presence.   Bea Graff, MDiv  09/05/22 1100  Spiritual Encounters  Type of Visit Initial  Care provided to: Patient  Reason for visit Advance directives

## 2022-09-05 NOTE — Plan of Care (Signed)

## 2022-09-05 NOTE — Progress Notes (Addendum)
Daily Progress Note   Patient Name: Roger [name]       Date: 09/05/2022 DOB: 13-Jan-1956  Age: 67 y.o. MRN#: [id number] Attending Physician: Antonieta Pert, MD Primary Care Physician: Aletha Halim., PA-C Admit Date: 08/31/2022  Reason for Consultation/Follow-up: Establishing goals of care  Patient Profile/HPI:  Patient is a 67 year-old male patient is a 67 year old male with a past medical history of SCC of the left lung with metastatic disease to bone, hypertension, COPD, anxiety, DVT, and hyperlipidemia who was admitted on 08/11/2022 for management of shortness of breath.  Imaging on admission noted large left pleural effusion associated with mild rightward shift of cardiac and mediastinal structures which can be suggestive of a malignant effusion.  IR was consulted for thoracentesis.  Patient also receiving medical management for concerns of CAP versus postobstructive pneumonia.  Cardiology also consulted due to A-fib with RVR.  Palliative medicine team consulted to assist with complex medical decision making.    Subjective: Chart reviewed including labs, progress notes, imaging from this and previous encounters. Pathology resulted from thoracentesis, "no malignant cells identified". Mr. Puzio was sleeping, but awoke easily.  He declined to complete advanced directives.  GOC continue to be restorative in hopes of continuing chemotherapy.   Physical Exam Vitals and nursing note reviewed.  Constitutional:      Comments: frail  Neurological:     Comments: Sleeping, but arouses easily             Vital Signs: BP 94/74 (BP Location: Left Arm)   Pulse 95   Temp (!) 97.5 F (36.4 C) (Oral)   Resp 20   Ht '5\' 11"'$  (1.803 m)   Wt 59.8 kg   SpO2 99%   BMI 18.39 kg/m  SpO2: SpO2: 99 % O2  Device: O2 Device: Nasal Cannula O2 Flow Rate: O2 Flow Rate (L/min): 3 L/min  Intake/output summary:  Intake/Output Summary (Last 24 hours) at 09/05/2022 1444 Last data filed at 09/05/2022 1440 [place] per 24 hour  Intake 634.96 ml  Output 1325 ml  Net -690.04 ml   LBM: Last BM Date : 09/02/22 (per pt) Baseline Weight: Weight: 59.9 kg Most recent weight: Weight: 59.8 kg     Patient Active Problem List   Diagnosis Date Noted   Atrial fibrillation with RVR ([place]) 09/03/2022  Acute systolic heart failure ([place]) 09/03/2022   Palliative care encounter 09/02/2022   Metastatic malignant neoplasm ([place]) 09/02/2022   Goals of care, counseling/discussion 09/02/2022   DNR (do not resuscitate) 09/02/2022   SCC of lung (small cell carcinoma), left ([place]) 09/02/2022   Pleural effusion, left 09/02/2022   Acute hypoxic respiratory failure ([place]) 09/02/2022   Persistent atrial fibrillation ([place]) 09/02/2022   Secondary hypercoagulable state ([place]) 09/02/2022   Pressure injury of skin 09/02/2022   Protein-calorie malnutrition, severe 09/01/2022   Pleural effusion 08/31/2022   Hypoxia 05/31/2022   Acute pulmonary embolism ([place]) 05/31/2022   DVT (deep venous thrombosis) ([place]) 05/31/2022   HLD (hyperlipidemia) 05/31/2022   Hyperglycemia 05/31/2022   Edema 05/23/2022   Primary squamous cell carcinoma of left lung ([place]) 05/03/2022   Palpitations 10/17/2018   PVC (premature ventricular contraction) 10/17/2018   Essential hypertension 10/17/2018   COPD GOLD 0 still smoking  07/13/2015   Hemoptysis 06/11/2015   CAP (community acquired pneumonia) 06/11/2015   Lung nodules 06/11/2015   Cigarette smoker 03/19/2014   Generalized anxiety disorder 11/09/2011    Palliative Care Assessment & Plan    Assessment/Recommendations/Plan  Continue current scope- DNR- limited additional interventions, continue chemotherapy when medically improved with medical management GOC clarified on 3/1- see note from Dr.  Vinetta Bergamo Recommend outpatient follow-up with Palliative- patient is currently active with [facility]- no further acute inpatient Palliative needs   Code Status: DNR  Prognosis:  Unable to determine  Discharge Planning: Home with Palliative Services  Care plan was discussed with patient.  Thank you for allowing the Palliative Medicine Team to assist in the care of this patient.  Total time:  40 minutes  Greater than 50%  of this time was spent counseling and coordinating care related to the above assessment and plan.  Mariana Kaufman, AGNP-C Palliative Medicine   Please contact Palliative Medicine Team phone at [phone] for questions and concerns.

## 2022-09-06 DIAGNOSIS — Z7189 Other specified counseling: Secondary | ICD-10-CM | POA: Diagnosis not present

## 2022-09-06 DIAGNOSIS — C3480 Malignant neoplasm of overlapping sites of unspecified bronchus and lung: Secondary | ICD-10-CM

## 2022-09-06 DIAGNOSIS — R627 Adult failure to thrive: Secondary | ICD-10-CM

## 2022-09-06 DIAGNOSIS — J90 Pleural effusion, not elsewhere classified: Secondary | ICD-10-CM | POA: Diagnosis not present

## 2022-09-06 LAB — GLUCOSE, CAPILLARY
Glucose-Capillary: 104 mg/dL — ABNORMAL HIGH (ref 70–99)
Glucose-Capillary: 94 mg/dL (ref 70–99)
Glucose-Capillary: 169 mg/dL — ABNORMAL HIGH (ref 70–99)
Glucose-Capillary: 93 mg/dL (ref 70–99)
Glucose-Capillary: 96 mg/dL (ref 70–99)
Glucose-Capillary: 155 mg/dL — ABNORMAL HIGH (ref 70–99)

## 2022-09-06 LAB — HEMOGLOBIN A1C
Hgb A1c MFr Bld: 6.7 % — ABNORMAL HIGH (ref 4.8–5.6)
Mean Plasma Glucose: 146 mg/dL

## 2022-09-06 MED ORDER — ALPRAZOLAM 0.25 MG PO TABS
0.2500 mg | ORAL_TABLET | Freq: Three times a day (TID) | ORAL | Status: DC
  Administered 2022-09-06 – 2022-09-11 (×16): 0.25 mg via ORAL
  Filled 2022-09-06 (×16): qty 1

## 2022-09-06 NOTE — Progress Notes (Addendum)
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 He was brought to the ED hypotensive 77/51, chest x-ray chronic interstitial disease persistent left lung opacities EKG with ?A-fib junctional escape.  Labs with mild leukocytosis anemia CT PE no PE, pulmonary stool favoring constipation, previous hypermetabolic left anterior abdominal wall mass has reduced in size large left pleural effusion, malignant effusion is a distinct possibility, drowned lung appearance of the left lung with complete consolidation and some volume loss in the left lower lobe and passive atelectasis, the cavitary mass in the left upper lobe and left lower lobes are roughly similar to 05/31/2022, mild hazy interstitial accentuation in both lungs along with faint groundglass opacities potentially from edema or atypical pneumonia. Patient was admitted for acute on chronic respiratory failure with left sided pleural effusion large with background stage IV non-small cell lung cancer, CAP versus postobstructive pneumonia, hypotension lactic acidosis electrolyte imbalance     Subjective: Patient seen and examined this morning He appears in good spirits alert awake Overnight BP remains soft  Labs stable   Assessment and Plan: Principal Problem:   Pleural effusion Active Problems:   Protein-calorie malnutrition, severe   Palliative care encounter   Metastatic malignant neoplasm ([place])   Goals of care, counseling/discussion   DNR (do not resuscitate)   SCC of lung (small cell carcinoma), left (HCC)   Pleural effusion, left   Acute hypoxic respiratory failure (HCC)   Persistent atrial fibrillation  (HCC)   Secondary hypercoagulable state (HCC)   Pressure injury of skin   Atrial fibrillation with RVR (HCC)   Acute systolic heart failure (HCC)   Acute hypoxic respiratory failure from large left side Pleural effusion on  O2 PRN at home, Stage IV non-small cell lung cancer: CT chest in the ED with large left pleural effusion, rightward shift of cardiac and mediastinal structure suggestive of malignant effusion. S/P thoracentesis on 2/29 with 1liter removed, fluids culture no growth, gram stain negative, S/p thoracentesis on 3/3 with 2 liter removed.Likely malignant-although cytology negative for malignancy, chest x-ray with persistent left pleural effusion, but overall not short of breath he remains weak and deconditioned and frail.  If he decides on hospice he may qualify with Pleurx catheter placement. Will need home oxygen.  I will send message to Dr. Julien Nordmann to clarify regarding his further chemo.  Patient mentions that his oncologist has been talking about giving 4 cycles after which Keytruda-I sent message to Dr. Julien Nordmann, sister and also patient requested to talk to him. Palliative care will be following up today.   CAP vs Post obstructive PNA  with associated sepsis/lactic acidosis:MRSA neg.Urine strep pneumo negative.  Bld Cx NGTD> Cont unasyn to finish total of 7 days abx treatment.    Hypotension/soft blood pressure: Started on midodrine, atenolol on hold.  Random cortisol level 19  Lactic acidosis:Multifactorial resolved.  Atrial fibrillation diagnosed in December 2023 on Eliquis at home on hold, started on amiodarone drip on 3/2 per cards recommendation> hopefully can transition to p.o. since follow-up cardiology recommendation.   DVT /PE, diagnosed in 05/31/2022 cont eliquis.  Hypokalemia/hypomagnesemia improved. Recent Labs  Lab 08/31/22 1146 09/01/22 0157 09/02/22 0446 09/03/22 0405 09/05/22  0346  K 3.8 3.0* 3.0* 3.7 4.5  CALCIUM 10.9* 10.5* 10.3 10.3 10.1  MG  --   --   1.7  --  1.7  PHOS  --   --  2.9  --   --     Constipation: cont stool softener. 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   s/p palliative radiotherapy to the painful abdominal lesion under the care of Dr. Sondra Come. Systemic chemotherapy with carboplatin for AUC of 5, paclitaxel 175 Mg/M2 and Keytruda 200 Mg IV every 3 weeks with Neulasta support status post 1 cycle which was given on May 09, 2022 , appeared that was the only chemo he received so far.  Based upon last oncology note patient was offered hospice/palliative care but he wanted to continue the treatment but he has been very weak he went to have a follow-up and outpatient CT scan, he is at risk of decompensation, palliative care has been engaged may benefit with hospice care-sister understands, sent message to palliative care/Dr. Julien Nordmann.  Addendum: 1L10 pm Dr. 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 Pressure Injury 08/31/22 Sacrum Stage 2 -  Partial thickness loss of dermis presenting as a shallow open injury with a red, pink wound bed without slough. red, open (Active)  08/31/22 1752  Location: Sacrum  Location Orientation:   Staging: Stage 2 -  Partial thickness loss of dermis presenting as a shallow open injury with a red, pink wound bed without slough.  Wound Description (Comments): red, open  Present on Admission: Yes  Dressing Type Foam - Lift dressing to assess site every shift 09/04/22 0800   DVT prophylaxis:  Code Status:   Code Status: DNR Family Communication: plan of care discussed with patient/sister on phone was updated extensively 3/4 Patient status is:  inpatient  because of respiratory failure recurrent pleural effusion Level of care: Progressive   Dispo:The patient is from:Home.             Anticipated disposition: TBD-likely hospice at home if patient agrees- sister is open to it.Await palliative care.  Objective: Vitals last 24 hrs: Vitals:   09/05/22 1827 09/05/22 2011 09/05/22 2100 09/06/22 0359  BP: 98/74 (!) '87/68 93/68 99/74 '$  Pulse:  94  94  Resp:    19  Temp:  97.8 F (36.6 C)  98.3 F (36.8 C)  TempSrc:  Oral  Oral  SpO2:  97%  98%  Weight:    60.1 kg  Height:       Weight change: 0.3 kg  Physical Examination: General exam: alert awake, pleasant, cachectic, older than stated age HEENT:Oral mucosa moist, Ear/Nose WNL grossly Respiratory system: Bilaterally diminished breath sounds at the bases,no use of accessory muscle Cardiovascular system: S1 & S2 +, No JVD. Gastrointestinal system: Abdomen soft,NT,ND, BS+ Nervous System: Alert, awake, moving extremities, he follows commands. Extremities: LE edema neg,distal peripheral pulses palpable.  Skin: No rashes,no icterus. MSK: Thin muscle bulk, lo/weak tone, power   Medications reviewed:  Scheduled  Meds:  ALPRAZolam  0.25 mg Oral TID   apixaban  5 mg Oral BID   atorvastatin  10 mg Oral QHS   feeding supplement  237 mL Oral BID BM   insulin aspart  0-6 Units Subcutaneous Q4H   magnesium oxide  400 mg Oral Daily   midodrine  2.5 mg Oral TID WC   mirtazapine  15 mg Oral QHS   polyethylene glycol  17 g Oral Daily   Ensure Max Protein  11 oz Oral Daily   saccharomyces boulardii  250 mg Oral BID   senna-docusate  1 tablet Oral BID   Continuous Infusions:  amiodarone 30 mg/hr (09/06/22 [id number])   ampicillin-sulbactam (UNASYN) IV 3 g (09/06/22 0158)   Diet Order             Diet Carb Modified Fluid consistency: Thin; Room service appropriate? Yes  Diet effective now                   Intake/Output Summary (Last 24 hours) at 09/06/2022 0821 Last data filed at 09/06/2022 0800 Gross per 24 hour  Intake 360 ml  Output 1625 ml  Net -1265 ml   Net IO Since Admission: 931.47 mL [09/06/22 0821]  Wt Readings from Last 3 Encounters:  09/06/22 60.1 kg  07/27/22 61.5 kg  06/21/22 65.8 kg     Unresulted Labs (From admission, onward)     Start     Ordered   09/01/22 1222  Total bilirubin, body fluid  RELEASE UPON ORDERING,   TIMED        09/01/22 1222   08/31/22 2244  Expectorated Sputum Assessment w Gram Stain, Rflx to Resp Cult  Once,   R        08/31/22 2244          Data Reviewed: I have personally reviewed following labs and imaging studies CBC: Recent Labs  Lab 08/31/22 1146 09/01/22 0157 09/02/22 0446 09/03/22 0405 09/05/22 0346  WBC 13.0* 9.8 11.4* 10.9* 10.4  NEUTROABS 11.7*  --  9.5* 9.1*  --   HGB 11.1* 10.2* 10.7* 11.1* 11.5*  HCT 35.9* 33.8* 34.8* 37.1* 38.0*  MCV 91.3 92.3 91.8 95.1 91.8  PLT 346 299 294 306 [id number]   Basic Metabolic Panel: Recent Labs  Lab 08/31/22 1146 09/01/22 0157 09/02/22 0446 09/03/22 0405 09/05/22 0346  NA 135 132* 136 140 132*  K 3.8 3.0* 3.0* 3.7 4.5  CL 101 103 104 105 98  CO2 '24 22 24 25 28  '$ GLUCOSE 185* 120* 146* 113*  109*  BUN '17 12 13 10 '$ 7*  CREATININE 0.93 0.64 0.59* 0.61 0.70  CALCIUM 10.9* 10.5* 10.3 10.3 10.1  MG  --   --  1.7  --  1.7  PHOS  --   --  2.9  --   --    GFR: Estimated Creatinine Clearance: 78.3 mL/min (by C-G formula based on  SCr of 0.7 mg/dL). Liver Function Tests: Recent Labs  Lab 08/31/22 1146 09/01/22 0157  AST 22 15  ALT 14 13  ALKPHOS 129* 116  BILITOT 1.0 0.6  PROT 7.6 7.2  ALBUMIN 2.8* 2.7*   Recent Labs  Lab 08/31/22 1146 09/01/22 0157  INR 2.1* 1.9*   Recent Labs  Lab 09/05/22 1619 09/05/22 2008 09/05/22 2327 09/06/22 0356 09/06/22 0801  GLUCAP 88 95 92 104* 94   Recent Labs  Lab 08/31/22 1146 08/31/22 1412 08/31/22 2312 09/01/22 0157  PROCALCITON  --   --  0.17  --   LATICACIDVEN 4.7* 2.7* 1.7 1.8    Recent Results (from the past 240 hour(s))  Blood Culture (routine x 2)     Status: None   Collection Time: 08/31/22 11:46 AM   Specimen: BLOOD RIGHT FOREARM  Result Value Ref Range Status   Specimen Description   Final    BLOOD RIGHT FOREARM Performed at [facility] [address], [place], [place] [address]    Special Requests   Final    BOTTLES DRAWN AEROBIC AND ANAEROBIC Blood Culture results may not be optimal due to an inadequate volume of blood received in culture bottles Performed at [facility] [address] [place], [place] [address]    Culture   Final    NO GROWTH 5 DAYS Performed at [facility], [place] [address], [place], [place] [address]    Report Status 09/05/2022 FINAL  Final  Blood Culture (routine x 2)     Status: None   Collection Time: 08/31/22 11:46 AM   Specimen: BLOOD LEFT FOREARM  Result Value Ref Range Status   Specimen Description   Final    BLOOD LEFT FOREARM Performed at [facility] [address] [place], [place] [address]    Special Requests   Final    BOTTLES DRAWN AEROBIC AND ANAEROBIC Blood Culture results may not be optimal due  to an inadequate volume of blood received in culture bottles Performed at [facility] [address], [place], [place] [address]    Culture   Final    NO GROWTH 5 DAYS Performed at [facility], [place] [address] [place], [place] [address]    Report Status 09/05/2022 FINAL  Final  MRSA Next Gen by PCR, Nasal     Status: None   Collection Time: 09/01/22  7:58 AM   Specimen: Nasal Mucosa; Nasal Swab  Result Value Ref Range Status   MRSA by PCR Next Gen NOT DETECTED NOT DETECTED Final    Comment: (NOTE) The GeneXpert MRSA Assay (FDA approved for NASAL specimens only), is one component of a comprehensive MRSA colonization surveillance program. It is not intended to diagnose MRSA infection nor to guide or monitor treatment for MRSA infections. Test performance is not FDA approved in patients less than 31 years old. Performed at [facility], [place] [address] [place], [place] [address]   Body fluid culture w Gram Stain     Status: None   Collection Time: 09/01/22 12:21 PM   Specimen: PATH Cytology Peritoneal fluid  Result Value Ref Range Status   Specimen Description   Final    PERITONEAL Performed at [facility], [place] [address], [place], [place] [address]    Special Requests   Final    NONE Performed at [facility], [place] [address] [place], [place] [address]    Gram  Stain   Final    WBC PRESENT, PREDOMINANTLY MONONUCLEAR NO ORGANISMS SEEN CYTOSPIN SMEAR    Culture   Final    NO GROWTH Performed at [facility], [place] [address], [place], [place] [address]    Report Status 09/05/2022 FINAL  Final    Antimicrobials: Anti-infectives (From admission, onward)    Start     Dose/Rate Route Frequency Ordered Stop   09/03/22 2000  Ampicillin-Sulbactam (UNASYN) 3 g in sodium chloride 0.9 % 100 mL IVPB        3 g 200 mL/hr over 30 Minutes Intravenous Every 6 hours 09/03/22 1403 09/07/22 0759   09/01/22 2000   Ampicillin-Sulbactam (UNASYN) 3 g in sodium chloride 0.9 % 100 mL IVPB  Status:  Discontinued        3 g 200 mL/hr over 30 Minutes Intravenous Every 6 hours 09/01/22 1817 09/03/22 1327   09/01/22 1000  vancomycin (VANCOREADY) IVPB 1250 mg/250 mL  Status:  Discontinued        1,250 mg 166.7 mL/hr over 90 Minutes Intravenous Every 24 hours 08/31/22 1710 09/01/22 1248   08/31/22 2200  ceFEPIme (MAXIPIME) 2 g in sodium chloride 0.9 % 100 mL IVPB  Status:  Discontinued        2 g 200 mL/hr over 30 Minutes Intravenous Every 8 hours 08/31/22 1652 09/01/22 1817   08/31/22 1330  vancomycin (VANCOCIN) IVPB 1000 mg/200 mL premix        1,000 mg 200 mL/hr over 60 Minutes Intravenous  Once 08/31/22 1316 08/31/22 1553   08/31/22 1315  ceFEPIme (MAXIPIME) 2 g in sodium chloride 0.9 % 100 mL IVPB        2 g 200 mL/hr over 30 Minutes Intravenous  Once 08/31/22 1306 08/31/22 1448      Culture/Microbiology    Component Value Date/Time   SDES  09/01/2022 1221    PERITONEAL Performed at [facility], [place] [address], [place], [place] [address]    SPECREQUEST  09/01/2022 1221    NONE Performed at [facility], [place] [address], [place], [place] [address]    CULT  09/01/2022 1221    NO GROWTH Performed at [facility] [address], [place], [place] [address]    REPTSTATUS 09/05/2022 FINAL 09/01/2022 1221  Other culture-see note  Radiology Studies: DG Chest 2 View  Result Date: 09/05/2022 CLINICAL DATA:  Pleural effusion EXAM: CHEST - 2 VIEW COMPARISON:  Chest radiograph dated 09/03/2022 FINDINGS: Decreased lung aeration. Increased patchy and interstitial opacities, left-greater-than-right with persistent dense left retrocardiac opacity. Similar small to moderate left pleural effusion. No pneumothorax. Left heart border is obscured. The visualized skeletal structures are unremarkable. IMPRESSION: 1. Similar small to moderate left pleural effusion. No pneumothorax. 2.  Decreased lung aeration with increased bilateral patchy and interstitial opacities, left-greater-than-right. Persistent dense left retrocardiac opacity in keeping with known cavitary masses. Electronically Signed   By: Darrin Nipper M.D.   On: 09/05/2022 16:08     LOS: 6 days   Antonieta Pert, MD [facility]  09/06/2022, 8:21 AM

## 2022-09-06 NOTE — Progress Notes (Addendum)
Daily Progress Note   Patient Name: Roger [name]       Date: 09/06/2022 DOB: 12-18-1955  Age: 67 y.o. MRN#: [id number] Attending Physician: Antonieta Pert, MD Primary Care Physician: Aletha Halim., PA-C Admit Date: 08/31/2022  Reason for Consultation/Follow-up: Establishing goals of care  Patient Profile/HPI:  Patient is a 67 year-old male patient is a 67 year old male with a past medical history of SCC of the left lung with metastatic disease to bone - s/p radiation treatments- received one round of chemotherapy in November of 2023 but has not received any further treatment since then-, hypertension, COPD, anxiety, DVT, and hyperlipidemia who was admitted on 08/11/2022 for management of shortness of breath.  Imaging on admission noted large left pleural effusion associated with mild rightward shift of cardiac and mediastinal structures which can be suggestive of a malignant effusion.There was also evidence of increase in size and scattered osseous mets.   IR was consulted for thoracentesis.  Patient also receiving medical management for concerns of CAP versus postobstructive pneumonia. Cardiology also consulted due to A-fib with RVR.  Palliative medicine team consulted to assist with complex medical decision making.    - thoracentesis on 2/29 with 1 L fluid off- pathology negative for malignant cells -thoracentesis on 3/2 with 2.1 L fluid off  Subjective: Chart reviewed including labs, progress notes, imaging from this and previous encounters.  Per request of attending provider I met with patient, his sister- Roger [name], his nephew, and sister in law Roger [name].  We discussed his current diagnosis and trajectories- reviewed that he has a stage IV cancer that per Dr. Julien Nordmann- he is not going to be able to  receive any further treatment for. Given this situation he has a terminal illness. We discussed recommendation for hospice services to ensure that he did not suffer as his cancer progresses.   [name] believes that he will continue to get stronger- he does not acknowledge his issues that lead to his hospitalization. He feels that even if he is not offered chemotherapy that there can be divine intervention to heal him. [name] related that he felt he was doing well, he is not suffering and has not had any suffering.  Prior to this admission he states he was able to ambulate around his home, and care for himself- however there is a note  from 2/20 indicating he was not bathing, not changing his clothes, not caring for himself, and he missed an appointment for his CT scan because he was too weak.  His goals of care continue to be full scope care with limit set at DNR.    Review of Systems  Constitutional:  Positive for malaise/fatigue and weight loss.     Physical Exam Vitals reviewed.  Neurological:     Mental Status: He is alert and oriented to person, place, and time.             Vital Signs: BP 107/70 (BP Location: Left Arm)   Pulse 97   Temp 98 F (36.7 C) (Oral)   Resp 20   Ht '5\' 11"'$  (1.803 m)   Wt 60.1 kg   SpO2 97%   BMI 18.48 kg/m  SpO2: SpO2: 97 % O2 Device: O2 Device: Nasal Cannula O2 Flow Rate: O2 Flow Rate (L/min): 3 L/min  Intake/output summary:  Intake/Output Summary (Last 24 hours) at 09/06/2022 1335 Last data filed at 09/06/2022 [id number] Gross per 24 hour  Intake 240 ml  Output 1325 ml  Net -1085 ml   LBM: Last BM Date : 09/03/22 (per patient) Baseline Weight: Weight: 59.9 kg Most recent weight: Weight: 60.1 kg       Palliative Assessment/Data: PPS:  40%      Patient Active Problem List   Diagnosis Date Noted   Atrial fibrillation with RVR ([place]) [id number]   Acute systolic heart failure ([place]) 09/03/2022   Palliative care encounter 09/02/2022   Metastatic  malignant neoplasm ([place]) 09/02/2022   Goals of care, counseling/discussion 09/02/2022   DNR (do not resuscitate) 09/02/2022   SCC of lung (small cell carcinoma), left ([place]) 09/02/2022   Pleural effusion, left 09/02/2022   Acute hypoxic respiratory failure ([place]) 09/02/2022   Persistent atrial fibrillation ([place]) 09/02/2022   Secondary hypercoagulable state ([place]) 09/02/2022   Pressure injury of skin 09/02/2022   Protein-calorie malnutrition, severe 09/01/2022   Pleural effusion 08/31/2022   Hypoxia 05/31/2022   Acute pulmonary embolism ([place]) 05/31/2022   DVT (deep venous thrombosis) ([place]) 05/31/2022   HLD (hyperlipidemia) 05/31/2022   Hyperglycemia 05/31/2022   Edema 05/23/2022   Primary squamous cell carcinoma of left lung ([place]) 05/03/2022   Palpitations 10/17/2018   PVC (premature ventricular contraction) 10/17/2018   Essential hypertension 10/17/2018   COPD GOLD 0 still smoking  07/13/2015   Hemoptysis 06/11/2015   CAP (community acquired pneumonia) 06/11/2015   Lung nodules 06/11/2015   Cigarette smoker 03/19/2014   Generalized anxiety disorder 11/09/2011    Palliative Care Assessment & Plan    Assessment/Recommendations/Plan  Patient understands he has stage IV cancer and that he is not likely to be offered further chemotherapy treatments- he acknowledges that he has gotten weaker since November, he has lost a great amount of weight- however- he continues to wish for full scope interventions, return hospitalizations, and declines support from hospice services- he is followed outpatient by [facility]- continue to recommend outpatient Palliative followup There are some inconsistencies in his report of his functional status and report from others- will order PT for evaluation and recommendations    Code Status: DNR  Prognosis:  < 6 months  Discharge Planning: Home with Palliative Services  Care plan was discussed with patient, family and care team  Thank  you for allowing the Palliative Medicine Team to assist in the care of this patient.  Total time:  80 minutes  Greater than 50%  of  this time was spent counseling and coordinating care related to the above assessment and plan.  Mariana Kaufman, AGNP-C Palliative Medicine   Please contact Palliative Medicine Team phone at [phone] for questions and concerns.

## 2022-09-06 NOTE — Plan of Care (Signed)

## 2022-09-06 NOTE — TOC Initial Note (Signed)
Transition of Care [facility]) - Initial/Assessment Note    Patient Details  Name: Roger [name] MRN: [id number] Date of Birth: 08/09/1955  Transition of Care [facility]) CM/SW Contact:    Illene Regulus, LCSW Phone Number: 09/06/2022, 10:30 AM  Clinical Narrative:                 Per chart review palliative care following. TOC will continue to follow for d/c needs.         Patient Goals and CMS Choice            Expected Discharge Plan and Services                                              Prior Living Arrangements/Services                       Activities of Daily Living Home Assistive Devices/Equipment: None ADL Screening (condition at time of admission) Patient's cognitive ability adequate to safely complete daily activities?: Yes Is the patient deaf or have difficulty hearing?: No Does the patient have difficulty seeing, even when wearing glasses/contacts?: No Does the patient have difficulty concentrating, remembering, or making decisions?: Yes Patient able to express need for assistance with ADLs?: Yes Does the patient have difficulty dressing or bathing?: No Independently performs ADLs?: Yes (appropriate for developmental age) Does the patient have difficulty walking or climbing stairs?: No Weakness of Legs: Both Weakness of Arms/Hands: None  Permission Sought/Granted                  Emotional Assessment              Admission diagnosis:  Pleural effusion [J90] Pleural effusion, left [J90] Acute hypoxic respiratory failure ([place]) [J96.01] Patient Active Problem List   Diagnosis Date Noted   Atrial fibrillation with RVR ([place]) [id number]   Acute systolic heart failure ([place]) 09/03/2022   Palliative care encounter 09/02/2022   Metastatic malignant neoplasm ([place]) 09/02/2022   Goals of care, counseling/discussion 09/02/2022   DNR (do not resuscitate) 09/02/2022   SCC of lung (small cell carcinoma), left ([place]) 09/02/2022    Pleural effusion, left 09/02/2022   Acute hypoxic respiratory failure ([place]) 09/02/2022   Persistent atrial fibrillation ([place]) 09/02/2022   Secondary hypercoagulable state ([place]) 09/02/2022   Pressure injury of skin 09/02/2022   Protein-calorie malnutrition, severe 09/01/2022   Pleural effusion 08/31/2022   Hypoxia 05/31/2022   Acute pulmonary embolism ([place]) 05/31/2022   DVT (deep venous thrombosis) ([place]) 05/31/2022   HLD (hyperlipidemia) 05/31/2022   Hyperglycemia 05/31/2022   Edema 05/23/2022   Primary squamous cell carcinoma of left lung ([place]) 05/03/2022   Palpitations 10/17/2018   PVC (premature ventricular contraction) 10/17/2018   Essential hypertension 10/17/2018   COPD GOLD 0 still smoking  07/13/2015   Hemoptysis 06/11/2015   CAP (community acquired pneumonia) 06/11/2015   Lung nodules 06/11/2015   Cigarette smoker 03/19/2014   Generalized anxiety disorder 11/09/2011   PCP:  Aletha Halim., PA-C Pharmacy:   [phone] - [place], [place] - [address] [address] Phone: [phone] Fax: [phone]  [facility] [address] [place] [address] [place] [address] Phone: [phone] Fax: [phone]     Social Determinants of Health (SDOH) Social History: SDOH Screenings  Food Insecurity: No Food Insecurity (08/31/2022)  Housing: Low Risk  (08/31/2022)  Transportation Needs: No Transportation Needs (08/31/2022)  Utilities: Not At Risk (08/31/2022)  Tobacco Use: Medium Risk (09/05/2022)   SDOH Interventions:     Readmission Risk Interventions     No data to display

## 2022-09-07 ENCOUNTER — Other Ambulatory Visit: Payer: BC Managed Care – PPO

## 2022-09-07 ENCOUNTER — Inpatient Hospital Stay (HOSPITAL_COMMUNITY): Payer: BC Managed Care – PPO

## 2022-09-07 ENCOUNTER — Ambulatory Visit: Payer: BC Managed Care – PPO

## 2022-09-07 ENCOUNTER — Encounter: Payer: Self-pay | Admitting: Internal Medicine

## 2022-09-07 ENCOUNTER — Ambulatory Visit: Payer: BC Managed Care – PPO | Admitting: Internal Medicine

## 2022-09-07 DIAGNOSIS — I5021 Acute systolic (congestive) heart failure: Secondary | ICD-10-CM | POA: Diagnosis not present

## 2022-09-07 DIAGNOSIS — J9601 Acute respiratory failure with hypoxia: Secondary | ICD-10-CM | POA: Diagnosis not present

## 2022-09-07 DIAGNOSIS — I4891 Unspecified atrial fibrillation: Secondary | ICD-10-CM | POA: Diagnosis not present

## 2022-09-07 DIAGNOSIS — J90 Pleural effusion, not elsewhere classified: Secondary | ICD-10-CM | POA: Diagnosis not present

## 2022-09-07 LAB — CBC WITH DIFFERENTIAL/PLATELET
Abs Immature Granulocytes: 0.04 10*3/uL (ref 0.00–0.07)
Basophils Absolute: 0.1 10*3/uL (ref 0.0–0.1)
Basophils Relative: 1 %
Eosinophils Absolute: 0.2 10*3/uL (ref 0.0–0.5)
Eosinophils Relative: 2 %
HCT: 36.5 % — ABNORMAL LOW (ref 39.0–52.0)
Hemoglobin: 11.1 g/dL — ABNORMAL LOW (ref 13.0–17.0)
Immature Granulocytes: 0 %
Lymphocytes Relative: 8 %
Lymphs Abs: 0.9 10*3/uL (ref 0.7–4.0)
MCH: 28.3 pg (ref 26.0–34.0)
MCHC: 30.4 g/dL (ref 30.0–36.0)
MCV: 93.1 fL (ref 80.0–100.0)
Monocytes Absolute: 0.8 10*3/uL (ref 0.1–1.0)
Monocytes Relative: 7 %
Neutro Abs: 9.8 10*3/uL — ABNORMAL HIGH (ref 1.7–7.7)
Neutrophils Relative %: 82 %
Platelets: 315 10*3/uL (ref 150–400)
RBC: 3.92 MIL/uL — ABNORMAL LOW (ref 4.22–5.81)
RDW: 15.9 % — ABNORMAL HIGH (ref 11.5–15.5)
WBC: 11.8 10*3/uL — ABNORMAL HIGH (ref 4.0–10.5)
nRBC: 0 % (ref 0.0–0.2)

## 2022-09-07 LAB — GLUCOSE, CAPILLARY
Glucose-Capillary: 117 mg/dL — ABNORMAL HIGH (ref 70–99)
Glucose-Capillary: 125 mg/dL — ABNORMAL HIGH (ref 70–99)
Glucose-Capillary: 129 mg/dL — ABNORMAL HIGH (ref 70–99)
Glucose-Capillary: 137 mg/dL — ABNORMAL HIGH (ref 70–99)
Glucose-Capillary: 157 mg/dL — ABNORMAL HIGH (ref 70–99)
Glucose-Capillary: 173 mg/dL — ABNORMAL HIGH (ref 70–99)

## 2022-09-07 LAB — COMPREHENSIVE METABOLIC PANEL
ALT: 10 U/L (ref 0–44)
AST: 17 U/L (ref 15–41)
Albumin: 2.5 g/dL — ABNORMAL LOW (ref 3.5–5.0)
Alkaline Phosphatase: 120 U/L (ref 38–126)
Anion gap: 8 (ref 5–15)
BUN: 7 mg/dL — ABNORMAL LOW (ref 8–23)
CO2: 26 mmol/L (ref 22–32)
Calcium: 9.7 mg/dL (ref 8.9–10.3)
Chloride: 97 mmol/L — ABNORMAL LOW (ref 98–111)
Creatinine, Ser: 0.67 mg/dL (ref 0.61–1.24)
GFR, Estimated: >60 mL/min (ref >60)
Glucose, Bld: 130 mg/dL — ABNORMAL HIGH (ref 70–99)
Potassium: 3.6 mmol/L (ref 3.5–5.1)
Sodium: 131 mmol/L — ABNORMAL LOW (ref 135–145)
Total Bilirubin: 0.6 mg/dL (ref 0.3–1.2)
Total Protein: 7.2 g/dL (ref 6.5–8.1)

## 2022-09-07 LAB — PHOSPHORUS: Phosphorus: 3.1 mg/dL (ref 2.5–4.6)

## 2022-09-07 LAB — MAGNESIUM: Magnesium: 2 mg/dL (ref 1.7–2.4)

## 2022-09-07 MED ORDER — AMIODARONE HCL 200 MG PO TABS
400.0000 mg | ORAL_TABLET | Freq: Two times a day (BID) | ORAL | Status: AC
  Administered 2022-09-07 – 2022-09-09 (×6): 400 mg via ORAL
  Filled 2022-09-07 (×6): qty 2

## 2022-09-07 MED ORDER — AMIODARONE HCL 200 MG PO TABS
200.0000 mg | ORAL_TABLET | Freq: Every day | ORAL | Status: DC
  Administered 2022-09-10 – 2022-09-11 (×2): 200 mg via ORAL
  Filled 2022-09-07 (×2): qty 1

## 2022-09-07 NOTE — Progress Notes (Signed)
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 He was brought to the ED hypotensive 77/51, chest x-ray chronic interstitial disease persistent left lung opacities EKG with ?A-fib junctional escape.  Labs with mild leukocytosis anemia CT PE no PE, pulmonary stool favoring constipation, previous hypermetabolic left anterior abdominal wall mass has reduced in size large left pleural effusion, malignant effusion is a distinct possibility, drowned lung appearance of the left lung with complete consolidation and some volume loss in the left lower lobe and passive atelectasis, the cavitary mass in the left upper lobe and left lower lobes are roughly similar to 05/31/2022, mild hazy interstitial accentuation in both lungs along with faint groundglass opacities potentially from edema or atypical pneumonia. Patient was admitted for acute on chronic respiratory failure with left sided pleural effusion large with background stage IV non-small cell lung cancer, CAP versus postobstructive pneumonia, hypotension lactic acidosis electrolyte imbalance     Assessment and Plan: No notes have been filed under this hospital service. Service: Hospitalist  Acute Hypoxic Respiratory Failure from large left side Pleural effusion on  O2 PRN at home, Stage IV non-small cell lung cancer: -CT chest in the ED with large left pleural effusion, rightward shift of cardiac and mediastinal structure suggestive of malignant effusion.  -S/P thoracentesis on 2/29 with 1liter removed, fluids culture no growth, gram stain negative, -S/p thoracentesis on 3/3 with 2 liter removed.Likely malignant-although  cytology negative for malignancy, chest x-ray with persistent left pleural effusion, but overall not short of breath he remains weak and deconditioned and frail.   -If he decides on hospice he may qualify with Pleurx catheter placement.  -Will need home oxygen.   -Sent message to Dr. Julien Nordmann to clarify regarding his further chemo.   -Patient mentions that his oncologist has been talking about giving 4 cycles after which Keytruda -Dr. Earlie Server evaluated and patient is status post a course of palliative radiotherapy and underwent cycle systemic chemotherapy with carboplatin, paclitaxel and Keytruda in November.  He was unable to resume his first treatment after his first cycle and has been noncompliant with his appointment for chemotherapy. -Oncology evaluated and do not think that he would be a great candidate to resume any systemic chemotherapy at this point because of intolerance and noncompliance and I strongly recommend the patient to consider palliative care and hospice after discharge from the hospital.  From a recurrent left pleural effusion he may benefit of Pleurx catheter placement before discharge for drainage of fluid   CAP vs Post obstructive PNA  with associated sepsis/lactic acidosis -MRSA neg.Urine strep pneumo negative.  Bld Cx NGTD> Cont unasyn to finish total of 7 days abx treatment.  -Repeat CXR in the AM   Hypotension/soft blood pressure -Started on midodrine, atenolol on hold.  Random cortisol level 19   Lactic acidosis -Multifactorial resolved.   Atrial Fibrillation diagnosed in December 2023 on Eliquis at home on hold, started on amiodarone drip on 3/2 per cards recommendation -Per my discussion with Cards able to transition to po Amiodarone 400 mg po BID x3 days and Amiodarone 200 mg po daily    DVT /PE, -Diagnosed in 05/31/2022 cont  Apixaban 5 mg po BID  Electrolyte Disturbances Recent Labs  Lab 08/31/22 1146 09/01/22 0157 09/01/22 0157 09/02/22 0446  09/03/22 0405 09/05/22 0346 09/07/22 1003  K 3.8 3.0*  --  3.0* 3.7 4.5 3.6  MG  --   --    < > 1.7  --  1.7 2.0  PHOS  --   --    < > 2.9  --   --  3.1  CALCIUM 10.9* 10.5*  --  10.3 10.3 10.1 9.7   < > = values in this interval not displayed.   Constipation -Cont stool softener.  Anxiety on chronic Xanax cut down his Xanax to  0.25 mg 3 times daily as he has been sleeping and nursing has been holding the Xanax   Stage IV non-small cell lung cancer Goals of care: -Squamous cell carcinoma presented with bilateral pulmonary masses and nodules as well as mediastinal and bilateral hilar adenopathy in addition to subcutaneous metastatic disease and bone metastasis diagnosed in October 2023 with PD-L1 expression of 1%.    -S/p palliative radiotherapy to the painful abdominal lesion under the care of Dr. Sondra Come. Systemic chemotherapy with carboplatin for AUC of 5, paclitaxel 175 Mg/M2 and Keytruda 200 Mg IV every 3 weeks with Neulasta support status post 1 cycle which was given on May 09, 2022 , appeared that was the only chemo he received so far.  -Based upon last oncology note patient was offered hospice/palliative care but he wanted to continue the treatment but he has been very weak he went to have a follow-up and outpatient CT scan, he is at risk of decompensation, palliative care has been engaged may benefit with hospice care-sister understands and dr. -Dr. 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 Pressure Injury 08/31/22 Sacrum Stage 2 -  Partial thickness loss of dermis presenting as a shallow open injury with a red, pink wound bed without slough. red, open (Active)  08/31/22 1752  Location: Sacrum  Location Orientation:   Staging: Stage 2 -  Partial thickness loss of dermis presenting as a shallow open injury with a red, pink wound bed without slough.  Wound Description (Comments): red, open  Present on Admission: Yes   Normocytic Anemia -Hgb/Hct: Recent Labs  Lab 08/31/22 1146 09/01/22 0157 09/02/22 0446 09/03/22 0405 09/05/22 0346 09/07/22 1003  HGB 11.1* 10.2* 10.7* 11.1* 11.5* 11.1*  HCT 35.9* 33.8* 34.8* 37.1* 38.0* 36.5*  MCV 91.3 92.3 91.8 95.1 91.8 93.1  -Check Anemia Panel in the AM -Continue to Monitor for S/Sx of Bleeding; No overt bleeding noted -Repeat CBC in the AM  Hypoalbuminemia -Patient's Albumin Trend: Recent Labs  Lab 08/31/22 1146 09/01/22 0157 09/07/22 1003  ALBUMIN 2.8* 2.7* 2.5*  -Continue to Monitor and Trend and repeat CMP in the AM  DVT prophylaxis:  apixaban (ELIQUIS)  tablet 5 mg    Code Status: DNR Family Communication: No family present at bedside   Disposition Plan:  Level of care: Progressive Status is: Inpatient Remains inpatient appropriate because: Needs further clinical improvement and continued Discussion with Palliative Care   Consultants:  Medical Oncology Palliative Care Cardiology  Procedures:  As delineated as above  Antimicrobials:  Anti-infectives (From admission, onward)    Start     Dose/Rate Route Frequency Ordered Stop   09/03/22 2000  Ampicillin-Sulbactam (UNASYN) 3 g in sodium chloride 0.9 % 100 mL IVPB        3 g 200 mL/hr over 30 Minutes  Intravenous Every 6 hours 09/03/22 1403 09/07/22 0216   09/01/22 2000  Ampicillin-Sulbactam (UNASYN) 3 g in sodium chloride 0.9 % 100 mL IVPB  Status:  Discontinued        3 g 200 mL/hr over 30 Minutes Intravenous Every 6 hours 09/01/22 1817 09/03/22 1327   09/01/22 1000  vancomycin (VANCOREADY) IVPB 1250 mg/250 mL  Status:  Discontinued        1,250 mg 166.7 mL/hr over 90 Minutes Intravenous Every 24 hours 08/31/22 1710 09/01/22 1248   08/31/22 2200  ceFEPIme (MAXIPIME) 2 g in sodium chloride 0.9 % 100 mL IVPB  Status:  Discontinued        2 g 200 mL/hr over 30 Minutes Intravenous Every 8 hours 08/31/22 1652 09/01/22 1817   08/31/22 1330  vancomycin (VANCOCIN) IVPB 1000 mg/200 mL premix        1,000 mg 200 mL/hr over 60 Minutes Intravenous  Once 08/31/22 1316 08/31/22 1553   08/31/22 1315  ceFEPIme (MAXIPIME) 2 g in sodium chloride 0.9 % 100 mL IVPB        2 g 200 mL/hr over 30 Minutes Intravenous  Once 08/31/22 1306 08/31/22 1448       Subjective: And examined at bedside he sitting in a chair and feeling okay.  States that shortness of breath was still there somewhat.  No nausea or vomiting.  Remains on amiodarone drip.  No other concerns or complaints at this time.  Objective: Vitals:   09/06/22 2038 09/07/22 0438 09/07/22 0500 09/07/22 1134  BP: 92/76 97/73  (!) 140/90  Pulse: 91 92  66  Resp: '17 17  18  '$ Temp: 98.1 F (36.7 C) 97.9 F (36.6 C)  97.6 F (36.4 C)  TempSrc: Oral Oral  Oral  SpO2: 97% 97%  100%  Weight:   60.4 kg   Height:        Intake/Output Summary (Last 24 hours) at 09/07/2022 1629 Last data filed at 09/07/2022 1023 Gross per 24 hour  Intake 1357 ml  Output 800 ml  Net 557 ml   Filed Weights   09/05/22 0418 09/06/22 0359 09/07/22 0500  Weight: 59.8 kg 60.1 kg 60.4 kg   Examination: Physical Exam:  Constitutional: Thin chronically ill young Caucasian male in no acute distress Respiratory: Diminished to auscultation bilaterally with coarse breath  sounds, no wheezing, rales, rhonchi or crackles. Normal respiratory effort and patient is not tachypenic. No accessory muscle use.  Unlabored breathing Cardiovascular: RRR, no murmurs / rubs / gallops. S1 and S2 auscultated. No extremity edema.  Abdomen: Soft, non-tender, non-distended. Bowel sounds positive.  GU: Deferred. Musculoskeletal: No clubbing / cyanosis of digits/nails. No joint deformity upper and lower extremities.  Skin: No rashes, lesions, ulcers on limited skin evaluation. No induration; Warm and dry.  Neurologic: CN 2-12 grossly intact with no focal deficits. Romberg sign  and cerebellar reflexes not assessed.  Psychiatric: Normal judgment and insight. Alert and oriented x 3. Normal mood and appropriate affect.   Data Reviewed: I have personally reviewed following labs and imaging studies  CBC: Recent Labs  Lab 09/01/22 0157 09/02/22 0446 09/03/22 0405 09/05/22 0346 09/07/22 1003  WBC 9.8 11.4* 10.9* 10.4 11.8*  NEUTROABS  --  9.5* 9.1*  --  9.8*  HGB 10.2* 10.7* 11.1* 11.5* 11.1*  HCT 33.8* 34.8* 37.1* 38.0* 36.5*  MCV 92.3 91.8 95.1 91.8 93.1  PLT 299 294 306 287 [id number]   Basic Metabolic Panel: Recent Labs  Lab 09/01/22 0157 09/02/22 0446 09/03/22 0405 09/05/22 0346 09/07/22 1003  NA 132* 136 140 132* 131*  K 3.0* 3.0* 3.7 4.5 3.6  CL 103 104 105 98 97*  CO2 '22 24 25 28 26  '$ GLUCOSE 120* 146* 113* 109* 130*  BUN '12 13 10 '$ 7* 7*  CREATININE 0.64 0.59* 0.61 0.70 0.67  CALCIUM 10.5* 10.3 10.3 10.1 9.7  MG  --  1.7  --  1.7 2.0  PHOS  --  2.9  --   --  3.1   GFR: Estimated Creatinine Clearance: 78.6 mL/min (by C-G formula based on SCr of 0.67 mg/dL). Liver Function Tests: Recent Labs  Lab 09/01/22 0157 09/07/22 1003  AST 15 17  ALT 13 10  ALKPHOS 116 120  BILITOT 0.6 0.6  PROT 7.2 7.2  ALBUMIN 2.7* 2.5*   No results for input(s): "LIPASE", "AMYLASE" in the last 168 hours. No results for input(s): "AMMONIA" in the last 168 hours. Coagulation  Profile: Recent Labs  Lab 09/01/22 0157  INR 1.9*   Cardiac Enzymes: No results for input(s): "CKTOTAL", "CKMB", "CKMBINDEX", "TROPONINI" in the last 168 hours. BNP (last 3 results) No results for input(s): "PROBNP" in the last 8760 hours. HbA1C: Recent Labs    09/05/22 0346  HGBA1C 6.7*   CBG: Recent Labs  Lab 09/06/22 2329 09/07/22 0441 09/07/22 0742 09/07/22 1129 09/07/22 1616  GLUCAP 155* 129* 117* 137* 125*   Lipid Profile: No results for input(s): "CHOL", "HDL", "LDLCALC", "TRIG", "CHOLHDL", "LDLDIRECT" in the last 72 hours. Thyroid Function Tests: No results for input(s): "TSH", "T4TOTAL", "FREET4", "T3FREE", "THYROIDAB" in the last 72 hours. Anemia Panel: No results for input(s): "VITAMINB12", "FOLATE", "FERRITIN", "TIBC", "IRON", "RETICCTPCT" in the last 72 hours. Sepsis Labs: Recent Labs  Lab 08/31/22 2312 09/01/22 0157  PROCALCITON 0.17  --   LATICACIDVEN 1.7 1.8    Recent Results (from the past 240 hour(s))  Blood Culture (routine x 2)     Status: None   Collection Time: 08/31/22 11:46 AM   Specimen: BLOOD RIGHT FOREARM  Result Value Ref Range Status   Specimen Description   Final    BLOOD RIGHT FOREARM Performed at [facility] [address], [place], [place] [address]    Special Requests   Final    BOTTLES DRAWN AEROBIC AND ANAEROBIC Blood Culture results may not be optimal due to an inadequate volume of blood received in culture bottles Performed at [facility] [address] [place], [place] [address]    Culture   Final    NO GROWTH 5 DAYS Performed at [facility], [place] [address], [place], [place] [address]    Report Status 09/05/2022 FINAL  Final  Blood Culture (routine x 2)     Status: None   Collection Time: 08/31/22 11:46 AM   Specimen: BLOOD LEFT FOREARM  Result Value Ref  Range Status   Specimen Description   Final    BLOOD LEFT FOREARM Performed at [facility] [address], [place], [place] [address]    Special Requests   Final    BOTTLES DRAWN AEROBIC AND ANAEROBIC Blood Culture results may not be optimal due to an inadequate volume of blood received in culture bottles Performed at [facility] [address], [place], [place] [address]    Culture   Final    NO GROWTH 5 DAYS Performed at [facility], [place] [address], [place], [place] [address]    Report Status 09/05/2022 FINAL  Final  MRSA Next Gen by PCR, Nasal     Status: None   Collection Time: 09/01/22  7:58 AM   Specimen: Nasal Mucosa; Nasal Swab  Result Value Ref Range Status   MRSA by PCR Next Gen NOT DETECTED NOT DETECTED Final    Comment: (NOTE) The GeneXpert MRSA Assay (FDA approved for NASAL specimens only), is one component of a comprehensive MRSA colonization surveillance program. It is not intended to diagnose MRSA infection nor to guide or monitor treatment for MRSA infections. Test performance is not FDA approved in patients less than 24 years old. Performed at [facility], [place] [address], [place], [place] [address]   Body fluid culture w Gram Stain     Status: None   Collection Time: 09/01/22 12:21 PM   Specimen: PATH Cytology Peritoneal fluid  Result Value Ref Range Status   Specimen Description   Final    PERITONEAL Performed at [facility], [place] [address], [place], [place] [address]    Special Requests   Final    NONE Performed at [facility], [place] [address], [place], [place] [address]    Gram Stain   Final    WBC PRESENT, PREDOMINANTLY MONONUCLEAR NO ORGANISMS SEEN CYTOSPIN SMEAR    Culture   Final    NO GROWTH Performed at [facility], [place] [address], [place],  [address]    Report Status 09/05/2022 FINAL  Final    Radiology Studies: DG CHEST PORT 1 VIEW  Result Date: 09/07/2022 CLINICAL DATA:  Shortness of breath, pleural effusion EXAM: PORTABLE CHEST 1 VIEW  COMPARISON:  Chest radiograph 09/05/2022 FINDINGS: The cardiomediastinal silhouette is stable. The small to moderate-sized left pleural effusion is similar to the prior study. Aeration of the lungs is unchanged, with dense retrocardiac opacity and opacity in the left midlung consistent with known masses seen on prior CT. The right lung is clear. There is no significant right effusion. There is no pneumothorax There is no acute osseous abnormality. IMPRESSION: Overall, no significant interval change in lung aeration since the study from 2 days prior with unchanged left pleural effusion and opacities in the left base and midlung. Electronically Signed   By: Valetta Mole M.D.   On: 09/07/2022 12:15    Scheduled Meds:  ALPRAZolam  0.25 mg Oral TID   amiodarone  400 mg Oral BID   Followed by   Derrill Memo ON 09/10/2022] amiodarone  200 mg Oral Daily   apixaban  5 mg Oral BID   atorvastatin  10 mg Oral QHS   feeding supplement  237 mL Oral BID BM   insulin aspart  0-6 Units Subcutaneous Q4H   midodrine  2.5 mg Oral TID WC   mirtazapine  15 mg Oral QHS   polyethylene glycol  17 g Oral Daily  Ensure Max Protein  11 oz Oral Daily   saccharomyces boulardii  250 mg Oral BID   senna-docusate  1 tablet Oral BID   Continuous Infusions:   LOS: 7 days   Raiford Noble, DO [facility] Available via Epic secure chat 7am-7pm After these hours, please refer to coverage provider listed on [email or web address] 09/07/2022, 4:29 PM

## 2022-09-07 NOTE — Progress Notes (Signed)
DIAGNOSIS:  Stage IV (T3, N3, M1 C) non-small cell lung cancer, squamous cell carcinoma presented with bilateral pulmonary masses and nodules as well as mediastinal and bilateral hilar adenopathy in addition to subcutaneous metastatic disease and bone metastasis diagnosed in October 2023 with PD-L1 expression of 1%.    He has no actionable mutations.    PRIOR THERAPY: None   CURRENT THERAPY: 1) Palliative systemic chemotherapy with carboplatin for AUC of 5, paclitaxel 175 Mg/M2 and Keytruda 200 Mg IV every 3 weeks with Neulasta. Status post 1 cycle. First dose on 05/09/22 2) Palliative radiation to the painful abdominal lesion under the care of Dr. Sondra Come. Simulation scheduled for 05/23/22  Subjective: The patient is seen and examined today.  He was eating his lunch.  He continues to have the baseline shortness of breath.  He was admitted to the hospital with worsening dyspnea after having CT scan of the chest for restaging of his disease.  He has no current fever or chills.  He has no nausea, vomiting, diarrhea or constipation.  He has been off treatment since that May 09, 2022.  He has a rough time with the first cycle of his chemotherapy.  He underwent ultrasound-guided left thoracentesis twice with drainage of more than 3 L of fluid.  Objective: Vital signs in last 24 hours: Temp:  [97.6 F (36.4 C)-98.1 F (36.7 C)] 97.6 F (36.4 C) (03/06 1134) Pulse Rate:  [66-92] 66 (03/06 1134) Resp:  [17-18] 18 (03/06 1134) BP: (92-140)/(73-90) 140/90 (03/06 1134) SpO2:  [97 %-100 %] 100 % (03/06 1134) Weight:  [133 lb 2.5 oz (60.4 kg)] 133 lb 2.5 oz (60.4 kg) (03/06 0500)  Intake/Output from previous day: 03/05 0701 - 03/06 0700 In: 717 [P.O.:717] Out: 1000 [Urine:1000] Intake/Output this shift: Total I/O In: 1000 [IV Piggyback:1000] Out: 400 [Urine:400]  General appearance: alert, cooperative, fatigued, and mild distress Resp: rales LLL Cardio: regular rate and rhythm, S1, S2  normal, no murmur, click, rub or gallop GI: soft, non-tender; bowel sounds normal; no masses,  no organomegaly Extremities: extremities normal, atraumatic, no cyanosis or edema  Lab Results:  Recent Labs    09/05/22 0346 09/07/22 1003  WBC 10.4 11.8*  HGB 11.5* 11.1*  HCT 38.0* 36.5*  PLT 287 315   BMET Recent Labs    09/05/22 0346 09/07/22 1003  NA 132* 131*  K 4.5 3.6  CL 98 97*  CO2 28 26  GLUCOSE 109* 130*  BUN 7* 7*  CREATININE 0.70 0.67  CALCIUM 10.1 9.7    Studies/Results: DG CHEST PORT 1 VIEW  Result Date: 09/07/2022 CLINICAL DATA:  Shortness of breath, pleural effusion EXAM: PORTABLE CHEST 1 VIEW COMPARISON:  Chest radiograph 09/05/2022 FINDINGS: The cardiomediastinal silhouette is stable. The small to moderate-sized left pleural effusion is similar to the prior study. Aeration of the lungs is unchanged, with dense retrocardiac opacity and opacity in the left midlung consistent with known masses seen on prior CT. The right lung is clear. There is no significant right effusion. There is no pneumothorax There is no acute osseous abnormality. IMPRESSION: Overall, no significant interval change in lung aeration since the study from 2 days prior with unchanged left pleural effusion and opacities in the left base and midlung. Electronically Signed   By: Valetta Mole M.D.   On: 09/07/2022 12:15    Medications: I have reviewed the patient's current medications.  CODE STATUS: No CODE BLUE  Assessment/Plan: This is a very pleasant 67 years old white male with  a stage IV non-small cell lung cancer, squamous cell carcinoma diagnosed in October 2023 with no actionable mutation and PD-L1 expression of 1%.  The patient is status post a course of palliative radiotherapy to the painful abdominal wall lesions.  He also underwent 1 cycle of systemic chemotherapy with carboplatin, paclitaxel and Keytruda on May 09, 2022.  He had a rough time with this treatment and he was unable to  resume his treatment after the first cycle.  He also has been noncompliant with his appointment for the chemotherapy. I had a lengthy discussion with the patient today about his current condition and treatment options.  I discussed with him the scan results which showed the drowned lung appearance of the left lower lobe with complete consolidation and volume loss in the left lower lobe with the large left pleural effusion that was drained twice.  There was also increased conspicuity as well as size of scattered osseous metastatic lesion. I do not think the patient will be a great candidate to resume any systemic chemotherapy at this point because of the intolerance and noncompliance. I strongly recommend for the patient to consider palliative care and hospice after discharge from the hospital.  He was reluctant at the beginning but he accepted the fact that he will not be able to handle systemic chemotherapy in the near future. For the recurrent left pleural effusion, he may benefit from Pleurx catheter placement before discharge for drainage of the fluid and this will be managed by hospice after discharge. Thank you for taking good care of Mr. Winne.  Please call if you have any questions.  LOS: 7 days    Eilleen Kempf 09/07/2022

## 2022-09-07 NOTE — Progress Notes (Signed)
Palliative-   Received call from patient's sister. She is hoping for an update regarding discussion between Dr. Julien Nordmann and patient.  She is anxious- she cannot care for [name] at home without some assistance.  I let  her know that I would call her later today or tomorrow if any updates became available, but had no further information currently.   Mariana Kaufman, AGNP-C Palliative Medicine  No charge

## 2022-09-07 NOTE — Evaluation (Signed)
Physical Therapy Evaluation Patient Details Name: Roger [name] MRN: [id number] DOB: 05/24/1956 Today's Date: 09/07/2022  History of Present Illness  67 yo male admitted with pleural effusion, acute on chronic resp failure, pna, weakness. Hx of stage IV met lung ca, COPD, anxiety, DVT, medical noncompliance, pressur ulcer  Clinical Impression  On eval, pt was Min guard A for mobility. He walked ~200 feet with support of IV pole. O2 88% on RA with min activity, 86% on 2L, HR 136 bpm with hallway ambulation. Pt reports he is planning to return home where he lives with his sister. Will recommend HHPT f/u and RW for ambulation safety, if pt is agreeable. Will plan to follow pt during this hospital stay.       Recommendations for follow up therapy are one component of a multi-disciplinary discharge planning process, led by the attending physician.  Recommendations may be updated based on patient status, additional functional criteria and insurance authorization.  Follow Up Recommendations Home health PT      Assistance Recommended at Discharge Intermittent Supervision/Assistance  Patient can return home with the following  Assistance with cooking/housework;Assist for transportation;Help with stairs or ramp for entrance    Equipment Recommendations Rolling walker (2 wheels)  Recommendations for Other Services       Functional Status Assessment Patient has had a recent decline in their functional status and demonstrates the ability to make significant improvements in function in a reasonable and predictable amount of time.     Precautions / Restrictions Precautions Precautions: Fall Precaution Comments: O2 Restrictions Weight Bearing Restrictions: No      Mobility  Bed Mobility Overal bed mobility: Modified Independent                  Transfers Overall transfer level: Needs assistance   Transfers: Sit to/from Stand Sit to Stand: Min guard           General transfer  comment: Cues for safety, hand placement    Ambulation/Gait Ambulation/Gait assistance: Min guard Gait Distance (Feet): 200 Feet Assistive device: IV Pole Gait Pattern/deviations: Step-through pattern, Decreased stride length       General Gait Details: MIn guard with support of IV pole. Intermittent Min A without UE support- ~20 feet. O2 86% 2L, HR 136 bpm  Stairs            Wheelchair Mobility    Modified Rankin (Stroke Patients Only)       Balance Overall balance assessment: Needs assistance         Standing balance support: Single extremity supported, During functional activity Standing balance-Leahy Scale: Poor                               Pertinent Vitals/Pain Pain Assessment Pain Assessment: No/denies pain    Home Living Family/patient expects to be discharged to:: Private residence Living Arrangements: Other relatives Available Help at Discharge: Family Type of Home: House           Home Equipment: None      Prior Function Prior Level of Function : Independent/Modified Independent                     Hand Dominance        Extremity/Trunk Assessment   Upper Extremity Assessment Upper Extremity Assessment: Defer to OT evaluation    Lower Extremity Assessment Lower Extremity Assessment: Generalized weakness    Cervical / Trunk Assessment  Cervical / Trunk Assessment: Normal  Communication   Communication: No difficulties  Cognition Arousal/Alertness: Awake/alert Behavior During Therapy: WFL for tasks assessed/performed Overall Cognitive Status: Within Functional Limits for tasks assessed                                          General Comments      Exercises     Assessment/Plan    PT Assessment Patient needs continued PT services  PT Problem List Decreased strength;Decreased balance;Decreased mobility;Decreased activity tolerance;Decreased knowledge of use of DME       PT  Treatment Interventions DME instruction;Gait training;Functional mobility training;Therapeutic activities;Balance training;Patient/family education;Therapeutic exercise    PT Goals (Current goals can be found in the Care Plan section)  Acute Rehab PT Goals Patient Stated Goal: to get better PT Goal Formulation: With patient Time For Goal Achievement: 09/21/22 Potential to Achieve Goals: Good    Frequency Min 3X/week     Co-evaluation               AM-PAC PT "6 Clicks" Mobility  Outcome Measure Help needed turning from your back to your side while in a flat bed without using bedrails?: None Help needed moving from lying on your back to sitting on the side of a flat bed without using bedrails?: None Help needed moving to and from a bed to a chair (including a wheelchair)?: A Little Help needed standing up from a chair using your arms (e.g., wheelchair or bedside chair)?: A Little Help needed to walk in hospital room?: A Little Help needed climbing 3-5 steps with a railing? : A Lot 6 Click Score: 19    End of Session Equipment Utilized During Treatment: Gait belt;Oxygen Activity Tolerance: Patient tolerated treatment well Patient left: in chair;with call bell/phone within reach;with chair alarm set   PT Visit Diagnosis: Muscle weakness (generalized) (M62.81);Difficulty in walking, not elsewhere classified (R26.2)    Time: [id number] PT Time Calculation (min) (ACUTE ONLY): 26 min   Charges:   PT Evaluation $PT Eval Low Complexity: 1 Low PT Treatments $Gait Training: 8-22 mins           Doreatha Massed, PT Acute Rehabilitation  Office: [phone]

## 2022-09-07 NOTE — TOC Progression Note (Signed)
Transition of Care [facility]) - Progression Note    Patient Details  Name: Roger [name] MRN: [id number] Date of Birth: [birth year]  [facility] CM/SW [place], LCSW Phone Number: 09/07/2022, 4:31 PM  Clinical Narrative:    CSW spoke with pt about recommendations for [facility] services. Pt wants to think about the decision. CSW will follow up tomorrow. TOC to follow.         Expected Discharge Plan and Services                                               Social Determinants of Health (SDOH) Interventions SDOH Screenings   Food Insecurity: No Food Insecurity (08/31/2022)  Housing: Low Risk  (08/31/2022)  Transportation Needs: No Transportation Needs (08/31/2022)  Utilities: Not At Risk (08/31/2022)  Tobacco Use: Medium Risk (09/05/2022)    Readmission Risk Interventions     No data to display

## 2022-09-08 ENCOUNTER — Inpatient Hospital Stay: Payer: BC Managed Care – PPO | Admitting: Physician Assistant

## 2022-09-08 ENCOUNTER — Inpatient Hospital Stay: Payer: BC Managed Care – PPO

## 2022-09-08 ENCOUNTER — Inpatient Hospital Stay (HOSPITAL_COMMUNITY): Payer: BC Managed Care – PPO

## 2022-09-08 ENCOUNTER — Inpatient Hospital Stay: Payer: BC Managed Care – PPO | Admitting: Nutrition

## 2022-09-08 DIAGNOSIS — J910 Malignant pleural effusion: Secondary | ICD-10-CM | POA: Diagnosis not present

## 2022-09-08 DIAGNOSIS — I4891 Unspecified atrial fibrillation: Secondary | ICD-10-CM | POA: Diagnosis not present

## 2022-09-08 DIAGNOSIS — I5021 Acute systolic (congestive) heart failure: Secondary | ICD-10-CM | POA: Diagnosis not present

## 2022-09-08 DIAGNOSIS — J9601 Acute respiratory failure with hypoxia: Secondary | ICD-10-CM | POA: Diagnosis not present

## 2022-09-08 DIAGNOSIS — C7951 Secondary malignant neoplasm of bone: Secondary | ICD-10-CM | POA: Diagnosis not present

## 2022-09-08 DIAGNOSIS — Z7189 Other specified counseling: Secondary | ICD-10-CM | POA: Diagnosis not present

## 2022-09-08 DIAGNOSIS — J90 Pleural effusion, not elsewhere classified: Secondary | ICD-10-CM | POA: Diagnosis not present

## 2022-09-08 LAB — CBC WITH DIFFERENTIAL/PLATELET
Abs Immature Granulocytes: 0.04 10*3/uL (ref 0.00–0.07)
Basophils Absolute: 0.1 10*3/uL (ref 0.0–0.1)
Basophils Relative: 1 %
Eosinophils Absolute: 0.2 10*3/uL (ref 0.0–0.5)
Eosinophils Relative: 2 %
HCT: 32.1 % — ABNORMAL LOW (ref 39.0–52.0)
Hemoglobin: 9.9 g/dL — ABNORMAL LOW (ref 13.0–17.0)
Immature Granulocytes: 0 %
Lymphocytes Relative: 8 %
Lymphs Abs: 0.8 10*3/uL (ref 0.7–4.0)
MCH: 27.9 pg (ref 26.0–34.0)
MCHC: 30.8 g/dL (ref 30.0–36.0)
MCV: 90.4 fL (ref 80.0–100.0)
Monocytes Absolute: 0.7 10*3/uL (ref 0.1–1.0)
Monocytes Relative: 7 %
Neutro Abs: 7.4 10*3/uL (ref 1.7–7.7)
Neutrophils Relative %: 82 %
Platelets: 267 10*3/uL (ref 150–400)
RBC: 3.55 MIL/uL — ABNORMAL LOW (ref 4.22–5.81)
RDW: 15.6 % — ABNORMAL HIGH (ref 11.5–15.5)
WBC: 9.1 10*3/uL (ref 4.0–10.5)
nRBC: 0 % (ref 0.0–0.2)

## 2022-09-08 LAB — COMPREHENSIVE METABOLIC PANEL
ALT: 11 U/L (ref 0–44)
AST: 15 U/L (ref 15–41)
Albumin: 2.2 g/dL — ABNORMAL LOW (ref 3.5–5.0)
Alkaline Phosphatase: 96 U/L (ref 38–126)
Anion gap: 6 (ref 5–15)
BUN: 8 mg/dL (ref 8–23)
CO2: 30 mmol/L (ref 22–32)
Calcium: 9.7 mg/dL (ref 8.9–10.3)
Chloride: 97 mmol/L — ABNORMAL LOW (ref 98–111)
Creatinine, Ser: 0.6 mg/dL — ABNORMAL LOW (ref 0.61–1.24)
GFR, Estimated: >60 mL/min (ref >60)
Glucose, Bld: 101 mg/dL — ABNORMAL HIGH (ref 70–99)
Potassium: 3.6 mmol/L (ref 3.5–5.1)
Sodium: 133 mmol/L — ABNORMAL LOW (ref 135–145)
Total Bilirubin: 0.8 mg/dL (ref 0.3–1.2)
Total Protein: 6.5 g/dL (ref 6.5–8.1)

## 2022-09-08 LAB — GLUCOSE, CAPILLARY
Glucose-Capillary: 99 mg/dL (ref 70–99)
Glucose-Capillary: 104 mg/dL — ABNORMAL HIGH (ref 70–99)
Glucose-Capillary: 109 mg/dL — ABNORMAL HIGH (ref 70–99)
Glucose-Capillary: 153 mg/dL — ABNORMAL HIGH (ref 70–99)
Glucose-Capillary: 121 mg/dL — ABNORMAL HIGH (ref 70–99)
Glucose-Capillary: 185 mg/dL — ABNORMAL HIGH (ref 70–99)

## 2022-09-08 LAB — PHOSPHORUS: Phosphorus: 2.5 mg/dL (ref 2.5–4.6)

## 2022-09-08 LAB — MAGNESIUM: Magnesium: 2 mg/dL (ref 1.7–2.4)

## 2022-09-08 NOTE — TOC Progression Note (Addendum)
Transition of Care [facility]) - Progression Note    Patient Details  Name: KEENON AUSTERMAN MRN: [id number] Date of Birth: March 08, 1956  Transition of Care [facility]) CM/SW [place], LCSW Phone Number: 09/08/2022, 12:05 PM  Clinical Narrative:     Pt has decided to take [place] has no preference in agency. CSW spoke to [place] with [facility] ,they are able to accept pt for HHPT/OT. Pt has agreed to recommendations for rolling walker.pt does not have preference in DME company, [place] reached out to [place] for rolling walker. Will need HH and DME orders MD made aware. TOC to follow for d/c needs.  ADDEN 2:30pm Received consult for hospice services, CSW sent referral to [facility] to follow up. TOC to follow for d/c needs.       Expected Discharge Plan and Services                                               Social Determinants of Health (SDOH) Interventions SDOH Screenings   Food Insecurity: No Food Insecurity (08/31/2022)  Housing: Low Risk  (08/31/2022)  Transportation Needs: No Transportation Needs (08/31/2022)  Utilities: Not At Risk (08/31/2022)  Tobacco Use: Medium Risk (09/05/2022)    Readmission Risk Interventions     No data to display

## 2022-09-08 NOTE — Progress Notes (Signed)
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 He was brought to the ED hypotensive 77/51, chest x-ray chronic interstitial disease persistent left lung opacities EKG with ?A-fib junctional escape.  Labs with mild leukocytosis anemia CT PE no PE, pulmonary stool favoring constipation, previous hypermetabolic left anterior abdominal wall mass has reduced in size large left pleural effusion, malignant effusion is a distinct possibility, drowned lung appearance of the left lung with complete consolidation and some volume loss in the left lower lobe and passive atelectasis, the cavitary mass in the left upper lobe and left lower lobes are roughly similar to 05/31/2022, mild hazy interstitial accentuation in both lungs along with faint groundglass opacities potentially from edema or atypical pneumonia. Patient was admitted for acute on chronic respiratory failure with left sided pleural effusion large with background stage IV non-small cell lung cancer, CAP versus postobstructive pneumonia, hypotension lactic acidosis electrolyte imbalance   Interim History He has had multiple thoracentesis while he has been here in on A-fib with RVR and had to be placed on amiodarone drip and cardiology consulted.  Medical oncology is evaluated and feels that he would not benefit from further chemotherapy and after further goals of care discussion patient has elected for home hospice.  Plan is to discharge home with hospice in the next 24 to 48 hours  Assessment and Plan:  Acute Hypoxic Respiratory Failure from large left side Pleural effusion on  O2 PRN at home, Stage IV non-small cell lung  cancer: -CT chest in the ED with large left pleural effusion, rightward shift of cardiac and mediastinal structure suggestive of malignant effusion.  -S/P thoracentesis on 2/29 with 1liter removed, fluids culture no growth, gram stain negative, -S/p thoracentesis on 3/3 with 2 liter removed.Likely malignant-although cytology negative for malignancy, chest x-ray with persistent left pleural effusion, but overall not short of breath he remains weak and deconditioned and frail.   -If he decides on hospice he may qualify with Pleurx catheter placement.  -Will need home oxygen.   -Sent message to Dr. Julien Nordmann to clarify regarding his further chemo.   -Patient mentions that his oncologist has been talking about giving 4 cycles after which Keytruda -Dr. Earlie Server evaluated and patient is status post a course of palliative radiotherapy and underwent cycle systemic chemotherapy with carboplatin, paclitaxel and Keytruda in November.  He was unable to resume his first treatment after his first cycle and has been noncompliant with his appointment for chemotherapy. -Oncology evaluated and do not think that he would be a great candidate to resume any systemic chemotherapy at this point because of intolerance and noncompliance and I strongly recommend the patient to consider palliative care and hospice after discharge from the hospital.  From a recurrent left pleural effusion he may benefit of Pleurx catheter placement before discharge for drainage of fluid; IR consulted for further evaluation of Pleurx catheter drainage and discussed options with the patient and at this time he opted to decline Pleurx catheter placement and stated that he would have the hospice team arrange further thoracentesis procedures for possible Pleurx catheter placement in the future if and when he becomes short of breath  again -Hospice has met with the patient given that he is elected for home hospice services and we will plan on discharging the  patient in the a.m. and provide this prescription at discharge to ensure ongoing symptom management   CAP vs Post obstructive PNA  with associated sepsis/lactic acidosis -MRSA neg.Urine strep pneumo negative.  Bld Cx NGTD> Cont unasyn to finish total of 7 days abx treatment.  -Repeat CXR this AM done and showed "New right lower lobe heterogeneous opacities, concerning for infection aspiration. Unchanged consolidation of the left lower lobe and moderate left pleural effusion."   Hypotension/soft blood pressure -Started on midodrine, atenolol on hold.  Random cortisol level 19    Lactic Acidosis -Multifactorial resolved.   Atrial Fibrillation diagnosed in December 2023 on Eliquis at home on hold, started on amiodarone drip on 3/2 per cards recommendation -Per my discussion with Cards able to transition to po Amiodarone 400 mg po BID x3 days and Amiodarone 200 mg po daily    DVT /PE, -Diagnosed in 05/31/2022 cont Apixaban 5 mg po BID   Electrolyte Disturbances Recent Labs  Lab 08/31/22 1146 09/01/22 0157 09/01/22 0157 09/02/22 0446 09/03/22 0405 09/05/22 0346 09/07/22 1003 09/08/22 0436  NA 135 132*  --  136 140 132* 131* 133*  K 3.8 3.0*  --  3.0* 3.7 4.5 3.6 3.6  CALCIUM 10.9* 10.5*  --  10.3 10.3 10.1 9.7 9.7  PHOS  --   --    < > 2.9  --   --  3.1 2.5   < > = values in this interval not displayed.    Constipation -Cont stool softener.   Anxiety on chronic Xanax cut down his Xanax to  0.25 mg 3 times daily as he has been sleeping and nursing has been holding the Xanax    Stage IV non-small cell lung cancer Goals of care: -Squamous cell carcinoma presented with bilateral pulmonary masses and nodules as well as mediastinal and bilateral hilar adenopathy in addition to subcutaneous metastatic disease and bone metastasis diagnosed in October 2023 with PD-L1 expression of 1%.    -S/p palliative radiotherapy to the painful abdominal lesion under the care of Dr. Sondra Come. Systemic  chemotherapy with carboplatin for AUC of 5, paclitaxel 175 Mg/M2 and Keytruda 200 Mg IV every 3 weeks with Neulasta support status post 1 cycle which was given on May 09, 2022 , appeared that was the only chemo he received so far.  -Based upon last oncology note patient was offered hospice/palliative care but he wanted to continue the treatment but he has been very weak he went to have a follow-up and outpatient CT scan, he is at risk of decompensation, palliative care has been engaged may benefit with hospice care-sister understands and dr. -Dr. Julien Nordmann evaluated and does not feel he is great candidate to resume resume systemic chemotherapy at this point because of intolerance and noncompliance and strongly recommends for the patient to consider palliative care and hospice after discharge from the hospital **After further GOC Discussion with palliative care to the differences between valve and hospice were discussed and he is agreed to hospice at home and IR was consulted to evaluate for Pleurx catheter and he did decline at this time -TOC has been ordered to place for hospice referral and on discharge we will send him out with morphine concentrate 10 mg 0.5 mL 5 mg sublingual every 1 hour as needed for pain and shortness of breath, lorazepam 2 mg/ML concentrate solution 1 mg  sublingually every 4 hours as needed for anxiety as well as Haldol 2 mg/mL solution 0.5 mill grams lingually every 4 hours as needed for agitation and nausea   Failure to thrive with overall poor prognosis Severe malnutrition augment diet  Nutrition Status: Nutrition Problem: Severe Malnutrition Etiology: chronic illness (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 Pressure Injury 08/31/22 Sacrum Stage 2 -  Partial thickness loss of dermis presenting as a shallow open injury with a red, pink wound bed without slough. red, open (Active)  08/31/22 1752  Location: Sacrum  Location Orientation:   Staging: Stage 2 -  Partial thickness loss of dermis presenting as a shallow open injury with a red, pink wound bed without slough.  Wound Description (Comments): red, open  Present on Admission: Yes    Normocytic Anemia -Hgb/Hct: Recent Labs  Lab 08/31/22 1146 09/01/22 0157 09/02/22 0446 09/03/22 0405 09/05/22 0346 09/07/22 1003 09/08/22 0436  HGB 11.1* 10.2* 10.7* 11.1* 11.5* 11.1* 9.9*  HCT 35.9* 33.8* 34.8* 37.1* 38.0* 36.5* 32.1*  MCV 91.3 92.3 91.8 95.1 91.8 93.1 90.4  -Check Anemia Panel in the AM -Continue to Monitor for S/Sx of Bleeding; No overt bleeding noted -Repeat CBC in the AM   Hypoalbuminemia -Patient's Albumin Trend: Recent Labs  Lab 08/31/22 1146 09/01/22 0157 09/07/22 1003 09/08/22 0436  ALBUMIN 2.8* 2.7* 2.5* 2.2*  -Continue to Monitor and Trend and repeat CMP in the AM  DVT prophylaxis:  apixaban (ELIQUIS) tablet 5 mg    Code Status: DNR Family Communication: No family present at bedside   Disposition Plan:  Level of care: Progressive Status is: Inpatient Remains inpatient appropriate because: Anticipating discharging home with home hospice in the next 24 hours   Consultants:  Medical oncology Palliative care Interventional radiology Cardiology  Procedures:  Thoracentesis x2  Antimicrobials:  Anti-infectives (From admission, onward)    Start     Dose/Rate Route Frequency Ordered Stop   09/03/22 2000  Ampicillin-Sulbactam (UNASYN) 3 g in sodium chloride 0.9 % 100 mL IVPB        3 g 200 mL/hr over 30 Minutes Intravenous Every 6 hours 09/03/22 1403 09/07/22 0216   09/01/22 2000  Ampicillin-Sulbactam (UNASYN) 3 g in sodium chloride 0.9 %  100 mL IVPB  Status:  Discontinued        3 g 200 mL/hr over 30 Minutes Intravenous Every 6 hours 09/01/22 1817 09/03/22 1327   09/01/22 1000  vancomycin (VANCOREADY) IVPB 1250 mg/250 mL  Status:  Discontinued        1,250 mg 166.7 mL/hr over 90 Minutes Intravenous Every 24 hours 08/31/22 1710 09/01/22 1248   08/31/22 2200  ceFEPIme (MAXIPIME) 2 g in sodium chloride 0.9 % 100 mL IVPB  Status:  Discontinued        2 g 200 mL/hr over 30 Minutes Intravenous Every 8 hours 08/31/22 1652 09/01/22 1817   08/31/22 1330  vancomycin (VANCOCIN) IVPB 1000 mg/200 mL premix        1,000 mg 200 mL/hr over 60 Minutes Intravenous  Once 08/31/22 1316 08/31/22 1553   08/31/22 1315  ceFEPIme (MAXIPIME) 2 g in sodium chloride 0.9 % 100 mL IVPB  2 g 200 mL/hr over 30 Minutes Intravenous  Once 08/31/22 1306 08/31/22 1448       Subjective: Seen and examined at bedside and was feeling okay.  Denies any more shortness of breath.  States that he slept fairly well last night.  No other concerns or complaints at this time.  Objective: Vitals:   09/08/22 0455 09/08/22 0507 09/08/22 1050 09/08/22 1210  BP:   106/87 103/73  Pulse:  98 100 94  Resp:    18  Temp:    97.8 F (36.6 C)  TempSrc:    Oral  SpO2:    100%  Weight: 57.4 kg     Height:        Intake/Output Summary (Last 24 hours) at 09/08/2022 1847 Last data filed at 09/08/2022 1700 Gross per 24 hour  Intake 477 ml  Output 950 ml  Net -473 ml   Filed Weights   09/06/22 0359 09/07/22 0500 09/08/22 0455  Weight: 60.1 kg 60.4 kg 57.4 kg   Examination: Physical Exam:  Constitutional: Thin [place] ill-appearing Caucasian male in no acute distress Respiratory: Diminished to auscultation bilaterally with some rhonchi, crackles and some coarse breath sounds, no wheezing, rales. Normal respiratory effort and patient is not tachypenic. No accessory muscle use.  Unlabored breathing wearing supplemental oxygen via nasal cannula Cardiovascular:  Irregularly irregular, no murmurs / rubs / gallops. S1 and S2 auscultated.  Trace extremity edema Abdomen: Soft, non-tender, non-distended. Bowel sounds positive.  GU: Deferred. Musculoskeletal: No clubbing / cyanosis of digits/nails. No joint deformity upper and lower extremities.  Skin: No rashes, lesions, ulcers limited skin evaluation. No induration; Warm and dry.  Neurologic: CN 2-12 grossly intact with no focal deficits. Romberg sign and cerebellar reflexes not assessed.  Psychiatric: Normal judgment and insight. Alert and oriented x 3. Normal mood and appropriate affect.   Data Reviewed: I have personally reviewed following labs and imaging studies  CBC: Recent Labs  Lab 09/02/22 0446 09/03/22 0405 09/05/22 0346 09/07/22 1003 09/08/22 0436  WBC 11.4* 10.9* 10.4 11.8* 9.1  NEUTROABS 9.5* 9.1*  --  9.8* 7.4  HGB 10.7* 11.1* 11.5* 11.1* 9.9*  HCT 34.8* 37.1* 38.0* 36.5* 32.1*  MCV 91.8 95.1 91.8 93.1 90.4  PLT 294 306 287 315 [id number]   Basic Metabolic Panel: Recent Labs  Lab 09/02/22 0446 09/03/22 0405 09/05/22 0346 09/07/22 1003 09/08/22 0436  NA 136 140 132* 131* 133*  K 3.0* 3.7 4.5 3.6 3.6  CL 104 105 98 97* 97*  CO2 '24 25 28 26 30  '$ GLUCOSE 146* 113* 109* 130* 101*  BUN 13 10 7* 7* 8  CREATININE 0.59* 0.61 0.70 0.67 0.60*  CALCIUM 10.3 10.3 10.1 9.7 9.7  MG 1.7  --  1.7 2.0 2.0  PHOS 2.9  --   --  3.1 2.5   GFR: Estimated Creatinine Clearance: 74.7 mL/min (A) (by C-G formula based on SCr of 0.6 mg/dL (L)). Liver Function Tests: Recent Labs  Lab 09/07/22 1003 09/08/22 0436  AST 17 15  ALT 10 11  ALKPHOS 120 96  BILITOT 0.6 0.8  PROT 7.2 6.5  ALBUMIN 2.5* 2.2*   No results for input(s): "LIPASE", "AMYLASE" in the last 168 hours. No results for input(s): "AMMONIA" in the last 168 hours. Coagulation Profile: No results for input(s): "INR", "PROTIME" in the last 168 hours. Cardiac Enzymes: No results for input(s): "CKTOTAL", "CKMB", "CKMBINDEX",  "TROPONINI" in the last 168 hours. BNP (last 3 results) No results for input(s): "PROBNP" in  the last 8760 hours. HbA1C: No results for input(s): "HGBA1C" in the last 72 hours. CBG: Recent Labs  Lab 09/08/22 0324 09/08/22 0359 09/08/22 0747 09/08/22 1208 09/08/22 1654  GLUCAP 99 104* 109* 153* 121*   Lipid Profile: No results for input(s): "CHOL", "HDL", "LDLCALC", "TRIG", "CHOLHDL", "LDLDIRECT" in the last 72 hours. Thyroid Function Tests: No results for input(s): "TSH", "T4TOTAL", "FREET4", "T3FREE", "THYROIDAB" in the last 72 hours. Anemia Panel: No results for input(s): "VITAMINB12", "FOLATE", "FERRITIN", "TIBC", "IRON", "RETICCTPCT" in the last 72 hours. Sepsis Labs: No results for input(s): "PROCALCITON", "LATICACIDVEN" in the last 168 hours.  Recent Results (from the past 240 hour(s))  Blood Culture (routine x 2)     Status: None   Collection Time: 08/31/22 11:46 AM   Specimen: BLOOD RIGHT FOREARM  Result Value Ref Range Status   Specimen Description   Final    BLOOD RIGHT FOREARM Performed at [facility] [address] [place], [place] [address]    Special Requests   Final    BOTTLES DRAWN AEROBIC AND ANAEROBIC Blood Culture results may not be optimal due to an inadequate volume of blood received in culture bottles Performed at [facility] [address], [place], [place] [address]    Culture   Final    NO GROWTH 5 DAYS Performed at [facility], [place] [address] [place], [place] [address]    Report Status 09/05/2022 FINAL  Final  Blood Culture (routine x 2)     Status: None   Collection Time: 08/31/22 11:46 AM   Specimen: BLOOD LEFT FOREARM  Result Value Ref Range Status   Specimen Description   Final    BLOOD LEFT FOREARM Performed at [facility] [address] [place], [place] [address]    Special Requests   Final    BOTTLES DRAWN AEROBIC AND ANAEROBIC Blood Culture results may not be  optimal due to an inadequate volume of blood received in culture bottles Performed at [facility] [address] [place], [place] [address]    Culture   Final    NO GROWTH 5 DAYS Performed at [facility], [place] [address], [place], [place] [address]    Report Status 09/05/2022 FINAL  Final  MRSA Next Gen by PCR, Nasal     Status: None   Collection Time: 09/01/22  7:58 AM   Specimen: Nasal Mucosa; Nasal Swab  Result Value Ref Range Status   MRSA by PCR Next Gen NOT DETECTED NOT DETECTED Final    Comment: (NOTE) The GeneXpert MRSA Assay (FDA approved for NASAL specimens only), is one component of a comprehensive MRSA colonization surveillance program. It is not intended to diagnose MRSA infection nor to guide or monitor treatment for MRSA infections. Test performance is not FDA approved in patients less than 67 years old. Performed at [facility], [place] [address], [place], [place] [address]   Body fluid culture w Gram Stain     Status: None   Collection Time: 09/01/22 12:21 PM   Specimen: PATH Cytology Peritoneal fluid  Result Value Ref Range Status   Specimen Description   Final    PERITONEAL Performed at [facility], [place] [address], [place], [place] [address]    Special Requests   Final    NONE Performed at [facility], [place] [address], [place],  [address]    Gram Stain   Final  WBC PRESENT, PREDOMINANTLY MONONUCLEAR NO ORGANISMS SEEN CYTOSPIN SMEAR    Culture   Final    NO GROWTH Performed at [facility], [place] [address] [place], [place] [address]    Report Status 09/05/2022 FINAL  Final    Radiology Studies: DG CHEST PORT 1 VIEW  Result Date: 09/08/2022 CLINICAL DATA:  Shortness of breath EXAM: PORTABLE CHEST 1 VIEW COMPARISON:  Chest x-ray dated September 07 2022 FINDINGS: Visualized cardiac and mediastinal contours are unchanged. New right lower lobe heterogeneous opacities. Unchanged  consolidation of the left lower lobe. Stable moderate left pleural effusion. No evidence of pneumothorax. IMPRESSION: 1. New right lower lobe heterogeneous opacities, concerning for infection aspiration. 2. Unchanged consolidation of the left lower lobe and moderate left pleural effusion. Electronically Signed   By: Yetta Glassman M.D.   On: 09/08/2022 08:12   DG CHEST PORT 1 VIEW  Result Date: 09/07/2022 CLINICAL DATA:  Shortness of breath, pleural effusion EXAM: PORTABLE CHEST 1 VIEW COMPARISON:  Chest radiograph 09/05/2022 FINDINGS: The cardiomediastinal silhouette is stable. The small to moderate-sized left pleural effusion is similar to the prior study. Aeration of the lungs is unchanged, with dense retrocardiac opacity and opacity in the left midlung consistent with known masses seen on prior CT. The right lung is clear. There is no significant right effusion. There is no pneumothorax There is no acute osseous abnormality. IMPRESSION: Overall, no significant interval change in lung aeration since the study from 2 days prior with unchanged left pleural effusion and opacities in the left base and midlung. Electronically Signed   By: Valetta Mole M.D.   On: 09/07/2022 12:15    Scheduled Meds:  ALPRAZolam  0.25 mg Oral TID   amiodarone  400 mg Oral BID   Followed by   Derrill Memo ON 09/10/2022] amiodarone  200 mg Oral Daily   apixaban  5 mg Oral BID   atorvastatin  10 mg Oral QHS   feeding supplement  237 mL Oral BID BM   insulin aspart  0-6 Units Subcutaneous Q4H   midodrine  2.5 mg Oral TID WC   mirtazapine  15 mg Oral QHS   polyethylene glycol  17 g Oral Daily   Ensure Max Protein  11 oz Oral Daily   saccharomyces boulardii  250 mg Oral BID   senna-docusate  1 tablet Oral BID   Continuous Infusions:   LOS: 8 days   Raiford Noble, DO [facility] Available via Epic secure chat 7am-7pm After these hours, please refer to coverage provider listed on [email or web address] 09/08/2022, 6:47 PM

## 2022-09-08 NOTE — Care Management Important Message (Signed)
Important Message  Patient Details IM Letter given Name: Roger [name] MRN: [id number] Date of Birth: 09/24/1955   Medicare Important Message Given:  Yes     Kerin Salen 09/08/2022, 10:24 AM

## 2022-09-08 NOTE — Consult Note (Signed)
Chief Complaint: Patient was seen in consultation today for left pleural effusion  Referring Physician(s): Florencia Reasons, MD  Supervising Physician: Daryll Brod  Patient Status: [facility] - In-pt  History of Present Illness: Roger [name] is a 67 y.o. male with PMH significant for anxiety, asthma, COPD, hypertension, and squamous cell carcinoma of the left lung being seen today in consultation for possible PleurX catheter placement to treat the patient's recurrent left pleural effusion. Patient was initially seen by IR team on 09/01/22 for a left-sided thoracentesis, and again for thoracentesis on 09/03/22. The patient reports a significant increase in quality of breathing since these procedures. The patient has been evaluated by Oncology, Palliative, and Hospice teams since that time, and the patient has opted to pursue hospice care upon discharge from the hospital. Oncology team and hospice/palliative care teams feel that PleurX catheter is a reasonable option to offer the patient and have consulted IR for possible placement.  Past Medical History:  Diagnosis Date   Anxiety    Asthma    COPD (chronic obstructive pulmonary disease) ([place])    Depression    Hemorrhoids    History of radiation therapy    Abdomen- 06/08/22-06/30/22- Dr. Gery Pray   History of rectal bleeding    Hypertension    Palpitations    Pneumonia    in 2015   Post-operative nausea and vomiting    pt unsure   Primary squamous cell carcinoma of left lung ([place]) 05/03/2022    Past Surgical History:  Procedure Laterality Date   DENTAL SURGERY     MASS EXCISION Right 04/20/2022   Procedure: NEEDLE BIOPSY OF CHEST WALL MASS RIGHT LOWER CHEST WALL;  Surgeon: [name], MD;  Location: [facility];  Service: Thoracic;  Laterality: Right;   NOSE SURGERY     SKIN CANCER EXCISION     left forehead   TONSILLECTOMY     VIDEO BRONCHOSCOPY WITH ENDOBRONCHIAL NAVIGATION N/A 04/20/2022   Procedure: VIDEO BRONCHOSCOPY WITH  ENDOBRONCHIAL NAVIGATION;  Surgeon: [name], MD;  Location: [facility] OR;  Service: Thoracic;  Laterality: N/A;    Allergies: Erythromycin, Prednisone, Sulfa antibiotics, Amoxicillin-pot clavulanate, and Meperidine and related  Medications: Prior to Admission medications   Medication Sig Start Date End Date Taking? Authorizing Provider  albuterol (PROVENTIL HFA;VENTOLIN HFA) 108 (90 Base) MCG/ACT inhaler Inhale 2 puffs into the lungs every 6 (six) hours as needed for wheezing or shortness of breath.   Yes [provider]  ALPRAZolam Duanne Moron) 1 MG tablet Take 1 mg by mouth See admin instructions. Take 1 mg by mouth at 7 AM, 11 AM, 3 PM, 7 PM, and an additional 1 mg once a day as needed for anxiety   Yes [provider]  apixaban (ELIQUIS) 5 MG TABS tablet Take 1 tablet (5 mg total) by mouth 2 (two) times daily. 06/20/22  Yes Heilingoetter, Cassandra L, PA-C  atenolol (TENORMIN) 50 MG tablet Take 50 mg by mouth daily.   Yes [provider]  atorvastatin (LIPITOR) 10 MG tablet Take 10 mg by mouth at bedtime.   Yes [provider]  fluticasone (FLONASE) 50 MCG/ACT nasal spray Place 2 sprays into both nostrils daily as needed for allergies. 01/13/22  Yes [provider]  metFORMIN (GLUCOPHAGE-XR) 500 MG 24 hr tablet Take 500 mg by mouth See admin instructions. Take 500 mg by mouth with the largest meal of the day 03/20/22  Yes [provider]  mirtazapine (REMERON) 15 MG tablet Take 1  tablet (15 mg total) by mouth at bedtime. 06/09/22  Yes Heilingoetter, Cassandra L, PA-C  prochlorperazine (COMPAZINE) 10 MG tablet Take 1 tablet (10 mg total) by mouth every 6 (six) hours as needed for nausea or vomiting. 05/03/22  Yes Curt Bears, MD  TYLENOL 500 MG tablet Take 500-1,000 mg by mouth every 6 (six) hours as needed for mild pain or headache.   Yes [provider]  apixaban (ELIQUIS) 5 MG TABS tablet Take 2 tablets ('10mg'$ ) twice daily for  7 days, then 1 tablet ('5mg'$ ) twice daily Patient not taking: Reported on 08/31/2022 05/24/22   Heilingoetter, Cassandra L, PA-C  atenolol (TENORMIN) 25 MG tablet Take 0.5 tablets (12.5 mg total) by mouth daily. Patient not taking: Reported on 08/31/2022 06/04/22 08/31/22  Donne Hazel, MD  lidocaine-prilocaine (EMLA) cream Apply to the Port-A-Cath site 30-60 minutes before treatment. Patient not taking: Reported on 08/31/2022 05/03/22   Curt Bears, MD     Family History  Problem Relation Age of Onset   Anxiety disorder Mother    Depression Mother    Lung disease Mother        "arthritic lung"   Anxiety disorder Sister    Depression Sister    Anxiety disorder Brother    Lung disease Father    Lung cancer Father    Anxiety disorder Sister    Anxiety disorder Brother    Anxiety disorder Brother    Anxiety disorder Brother    Colon cancer Brother     Social History   Socioeconomic History   Marital status: Single    Spouse name: Not on file   Number of children: Not on file   Years of education: Not on file   Highest education level: Not on file  Occupational History   Not on file  Tobacco Use   Smoking status: Former    Packs/day: 0.50    Years: 25.00    Total pack years: 12.50    Types: Cigarettes   Smokeless tobacco: Never  Vaping Use   Vaping Use: Never used  Substance and Sexual Activity   Alcohol use: Never   Drug use: Never   Sexual activity: Not Currently  Other Topics Concern   Not on file  Social History Narrative   Not on file   Social Determinants of Health   Financial Resource Strain: Not on file  Food Insecurity: No Food Insecurity (08/31/2022)   Hunger Vital Sign    Worried About Running Out of Food in the Last Year: Never true    Ran Out of Food in the Last Year: Never true  Transportation Needs: No Transportation Needs (08/31/2022)   PRAPARE - Hydrologist (Medical): No    Lack of Transportation (Non-Medical):  No  Physical Activity: Not on file  Stress: Not on file  Social Connections: Not on file    Review of Systems: A 12 point ROS discussed and pertinent positives are indicated in the HPI above.  All other systems are negative.  Review of Systems  Constitutional:  Positive for fatigue. Negative for chills and fever.  Respiratory:  Negative for shortness of breath.   Cardiovascular:  Negative for chest pain.  Psychiatric/Behavioral:  Negative for confusion.     Vital Signs: BP 103/73 (BP Location: Right Arm)   Pulse 94   Temp 97.8 F (36.6 C) (Oral)   Resp 18   Ht '5\' 11"'$  (1.803 m)   Wt 126 lb 8 oz (57.4  kg)   SpO2 100%   BMI 17.64 kg/m     Physical Exam Vitals reviewed.  Constitutional:      Appearance: He is ill-appearing.  Cardiovascular:     Rate and Rhythm: Normal rate and regular rhythm.     Pulses: Normal pulses.     Heart sounds: Normal heart sounds.  Pulmonary:     Breath sounds: Examination of the left-middle field reveals decreased breath sounds. Examination of the left-lower field reveals decreased breath sounds. Decreased breath sounds present.  Chest:     Chest wall: No tenderness.  Skin:    General: Skin is warm and dry.  Neurological:     Mental Status: He is alert and oriented to person, place, and time.  Psychiatric:        Mood and Affect: Mood normal.        Behavior: Behavior normal.     Imaging: DG CHEST PORT 1 VIEW  Result Date: 09/08/2022 CLINICAL DATA:  Shortness of breath EXAM: PORTABLE CHEST 1 VIEW COMPARISON:  Chest x-ray dated September 07 2022 FINDINGS: Visualized cardiac and mediastinal contours are unchanged. New right lower lobe heterogeneous opacities. Unchanged consolidation of the left lower lobe. Stable moderate left pleural effusion. No evidence of pneumothorax. IMPRESSION: 1. New right lower lobe heterogeneous opacities, concerning for infection aspiration. 2. Unchanged consolidation of the left lower lobe and moderate left pleural  effusion. Electronically Signed   By: Yetta Glassman M.D.   On: 09/08/2022 08:12   DG CHEST PORT 1 VIEW  Result Date: 09/07/2022 CLINICAL DATA:  Shortness of breath, pleural effusion EXAM: PORTABLE CHEST 1 VIEW COMPARISON:  Chest radiograph 09/05/2022 FINDINGS: The cardiomediastinal silhouette is stable. The small to moderate-sized left pleural effusion is similar to the prior study. Aeration of the lungs is unchanged, with dense retrocardiac opacity and opacity in the left midlung consistent with known masses seen on prior CT. The right lung is clear. There is no significant right effusion. There is no pneumothorax There is no acute osseous abnormality. IMPRESSION: Overall, no significant interval change in lung aeration since the study from 2 days prior with unchanged left pleural effusion and opacities in the left base and midlung. Electronically Signed   By: Valetta Mole M.D.   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 Request for therapeutic thoracentesis. EXAM: ULTRASOUND GUIDED LEFT THORACENTESIS MEDICATIONS: 1% lidocaine 10 mL COMPLICATIONS: None immediate. PROCEDURE: An ultrasound guided thoracentesis was thoroughly discussed with the patient and questions answered. The benefits, risks, alternatives and complications were also discussed. The patient understands and wishes to proceed with the procedure. Written consent was obtained. Ultrasound was performed to localize and Nahmir an adequate pocket of fluid in the left chest. The area was then prepped and draped in the normal sterile fashion. 1% Lidocaine was used for local anesthesia. Under ultrasound guidance a 6 Fr Safe-T-Centesis catheter was introduced. Thoracentesis was performed. The catheter was removed and a dressing applied. FINDINGS: A total of approximately 2.1 L of clear amber fluid was removed.  IMPRESSION: Successful ultrasound guided left thoracentesis yielding 2.1 L of pleural fluid. No pneumothorax on post-procedure chest x-ray. Procedure performed by: Gareth Eagle, PA-C Electronically Signed   By: Aletta Edouard M.D.   On: 09/06/2022 09:28   DG Chest 2 View  Result Date: 09/05/2022 CLINICAL DATA:  Pleural effusion EXAM: CHEST - 2 VIEW COMPARISON:  Chest radiograph dated 09/03/2022 FINDINGS: Decreased lung aeration. Increased patchy and interstitial opacities, left-greater-than-right with persistent dense left retrocardiac opacity. Similar small to moderate left pleural effusion. No pneumothorax. Left heart border is obscured. The visualized skeletal structures are unremarkable. IMPRESSION: 1. Similar small to moderate left pleural effusion. No pneumothorax. 2. Decreased lung aeration with increased bilateral patchy and interstitial opacities, left-greater-than-right. Persistent dense left retrocardiac opacity in keeping with known cavitary masses. Electronically Signed   By: Darrin Nipper M.D.   On: 09/05/2022 16:08   DG Chest 1 View  Result Date: 09/03/2022 CLINICAL DATA:  Pleural effusion.  Status post thoracentesis. EXAM: CHEST  1 VIEW COMPARISON:  Chest x-ray September 03, 2022 FINDINGS: No pneumothorax after thoracentesis. The left-sided pleural effusion remains but is smaller. The opacity underlying the left pleural effusion is again identified, more conspicuous in the interval given the smaller effusion. Reticular changes in the right lung base are stable. No other interval changes. IMPRESSION: 1. No pneumothorax after thoracentesis. 2. The left pleural effusion is smaller. 3. The opacity underlying the left pleural effusion is more conspicuous in the interval given the smaller effusion. 4. Reticular changes in the right lung base are stable. Electronically Signed   By: Dorise Bullion III M.D.   On: 09/03/2022 14:42   DG CHEST PORT 1 VIEW  Result Date: 09/03/2022 CLINICAL DATA:  Shortness of  breath. EXAM: PORTABLE CHEST 1 VIEW COMPARISON:  09/01/2022 FINDINGS: Large left pleural effusion with underlying pulmonary opacity. Diffuse interstitial coarsening primarily attributed to emphysema. Findings are underestimated relative to recent CT. Normal heart size where not obscured. No pneumothorax IMPRESSION: 1. Unchanged large left pleural effusion and pulmonary opacity. 2. Advanced emphysema. Electronically Signed   By: Jorje Guild M.D.   On: 09/03/2022 08:18   ECHOCARDIOGRAM LIMITED  Result Date: 09/02/2022    ECHOCARDIOGRAM LIMITED REPORT   Patient Name:   Roger [name] Date of Exam: 09/02/2022 Medical Rec #:  [id number]   Height:       71.0 in Accession #:    [id number]  Weight:       127.9 lb Date of Birth:  11-01-1955  BSA:          1.743 m Patient Age:    47 years    BP:           100/72 mmHg Patient Gender: M           HR:           74 bpm. Exam Location:  [facility] Procedure: Limited Echo, Cardiac Doppler and Limited Color Doppler Indications:    Congestive Heart Failure I50.9  History:        Patient has prior history of Echocardiogram examinations, most                 recent 06/01/2022. Signs/Symptoms:Shortness of Breath; Risk                 Factors:Former Smoker and Hypertension.  Sonographer:    Greer Pickerel Referring Phys: [id number] SARA-MAIZ A THOMAS  Sonographer Comments: Image acquisition challenging due to patient body habitus and Image acquisition challenging due to respiratory motion. IMPRESSIONS  1. Left ventricular ejection fraction, by estimation, is 45 to 50%. The left ventricle has mildly decreased function. Left ventricular diastolic parameters are indeterminate.  2. Right ventricular systolic function mild to moderately reduced. The right ventricular size is moderately enlarged. Tricuspid regurgitation signal is inadequate for assessing PA pressure.  3. Large pleural effusion  in the left lateral region.  4. No evidence of mitral valve regurgitation.  5. Aortic valve  regurgitation is not visualized.  6. The inferior vena cava is dilated in size with >50% respiratory variability, suggesting right atrial pressure of 8 mmHg. Comparison(s): Pericardial effusion has improved, plerual effusion is new from prior. FINDINGS  Left Ventricle: Left ventricular ejection fraction, by estimation, is 45 to 50%. The left ventricle has mildly decreased function. The left ventricular internal cavity size was normal in size. There is no left ventricular hypertrophy. Left ventricular diastolic parameters are indeterminate. Right Ventricle: The right ventricular size is moderately enlarged. Right ventricular systolic function mild to moderately reduced. Tricuspid regurgitation signal is inadequate for assessing PA pressure. Pericardium: Trivial pericardial effusion is present. The pericardial effusion is surrounding the apex. There is excessive respiratory variation in septal movement. Aortic Valve: Aortic valve regurgitation is not visualized. Aorta: The aortic root is normal in size and structure. Venous: The inferior vena cava is dilated in size with greater than 50% respiratory variability, suggesting right atrial pressure of 8 mmHg. Additional Comments: There is a large pleural effusion in the left lateral region. Spectral Doppler performed. Color Doppler performed.  LEFT VENTRICLE PLAX 2D LVIDd:         4.70 cm     Diastology LVIDs:         3.70 cm     LV e' medial:   7.29 cm/s LV PW:         0.70 cm     LV E/e' medial: 5.6 LV IVS:        0.60 cm LVOT diam:     2.20 cm LVOT Area:     3.80 cm  LV Volumes (MOD) LV vol d, MOD A2C: 71.8 ml LV vol d, MOD A4C: 70.9 ml LV vol s, MOD A2C: 42.2 ml LV vol s, MOD A4C: 32.9 ml LV SV MOD A2C:     29.6 ml LV SV MOD A4C:     70.9 ml LV SV MOD BP:      35.3 ml RIGHT VENTRICLE RV S prime:     7.07 cm/s TAPSE (M-mode): 0.8 cm LEFT ATRIUM         Index LA diam:    3.70 cm 2.12 cm/m   AORTA Ao Root diam: 3.45 cm MITRAL VALVE MV Area (PHT): 4.36 cm    SHUNTS MV  Decel Time: 174 msec    Systemic Diam: 2.20 cm MV E velocity: 41.10 cm/s MV A velocity: 68.60 cm/s MV E/A ratio:  0.60 Rudean Haskell MD Electronically signed by Rudean Haskell MD Signature Date/Time: 09/02/2022/4:19:24 PM    Final    US THORACENTESIS ASP PLEURAL SPACE W/IMG GUIDE  Result Date: 09/01/2022 INDICATION: Left pleural effusion EXAM: ULTRASOUND GUIDED LEFT therapeutic THORACENTESIS MEDICATIONS: 5 cc 1% lidocaine COMPLICATIONS: None immediate. PROCEDURE: An ultrasound guided thoracentesis was thoroughly discussed with the patient and questions answered. The benefits, risks, alternatives and complications were also discussed. The patient understands and wishes to proceed with the procedure. Written consent was obtained. Ultrasound was performed to localize and Joselito an adequate pocket of fluid in the left chest. The area was then prepped and draped in the normal sterile fashion. 1% Lidocaine was used for local anesthesia. Under ultrasound guidance a 6 Fr Safe-T-Centesis catheter was introduced. Thoracentesis was performed. The catheter was removed and a dressing applied. FINDINGS: A total of approximately 1 L of amber fluid was removed. Ordering provider did not request laboratory samples. IMPRESSION:  Successful ultrasound guided left thoracentesis yielding 1 L of pleural fluid. Follow-up chest x-ray revealed no evidence of pneumothorax. Read by: Reatha Armour, PA-C Electronically Signed   By: Corrie Mckusick D.O.   On: 09/01/2022 12:40   DG CHEST PORT 1 VIEW  Result Date: 09/01/2022 CLINICAL DATA:  Post thoracentesis. EXAM: PORTABLE CHEST 1 VIEW COMPARISON:  Chest radiograph and CTA chest 1 day prior FINDINGS: The cardiomediastinal silhouette is grossly stable. There is persistent moderate-to-large left pleural effusion following thoracentesis, decreased in size with slightly improved aeration of the left lung. The right lung is clear. There is no significant right effusion. There is no  appreciable pneumothorax There is no acute osseous abnormality. IMPRESSION: Persistent moderate-to-large left pleural effusion following thoracentesis but with slightly improved aeration of the left lung. No appreciable pneumothorax. Electronically Signed   By: Valetta Mole M.D.   On: 09/01/2022 11:09   CT Angio Chest PE W and/or Wo Contrast  Result Date: 08/31/2022 CLINICAL DATA:  Stage IV bronchogenic carcinoma. Acute pulmonary embolus in November 2023. Current chemotherapy and radiation therapy. Difficulty breathing and possible sepsis. * Tracking Code: BO * EXAM: CT ANGIOGRAPHY CHEST CT ABDOMEN AND PELVIS WITH CONTRAST TECHNIQUE: Multidetector CT imaging of the chest was performed using the standard protocol during bolus administration of intravenous contrast. Multiplanar CT image reconstructions and MIPs were obtained to evaluate the vascular anatomy. Multidetector CT imaging of the abdomen and pelvis was performed using the standard protocol during bolus administration of intravenous contrast. RADIATION DOSE REDUCTION: This exam was performed according to the departmental dose-optimization program which includes automated exposure control, adjustment of the mA and/or kV according to patient size and/or use of iterative reconstruction technique. CONTRAST:  160m OMNIPAQUE IOHEXOL 350 MG/ML SOLN COMPARISON:  Multiple exams, including CTA chest 05/31/2022 and PET-CT 03/24/2022 FINDINGS: CTA CHEST FINDINGS Cardiovascular: No current filling defect in the pulmonary arterial tree to suggest active pulmonary embolus. Coronary, aortic arch, and branch vessel atherosclerotic vascular disease. Mediastinum/Nodes: Right paratracheal node 0.9 cm in short axis on image 37 series 3, formerly the same. Right eccentric subcarinal adenopathy 1.4 cm in short axis on image 47 series 3, previously 1.8 cm. Lungs/Pleura: Severe emphysema. 1.9 by 1.4 cm spiculated right lower lobe pulmonary nodule on image 95 series 5, very  ill-defined margins, previously about 1.8 by 1.4 cm on 05/31/2022. Interstitial accentuation and hazy ground-glass opacity in the right lung and aerated portion of the left lung. Drowned lung appearance of the left lower lobe with complete consolidation and some volume loss in the left lower lobe. There is likewise passive atelectasis of much of the left upper lobe. The cavitary masses in the left upper lobe and left lower lobe are somewhat obscured, but hypodensity suggesting underlying mass measuring 6.6 by 5.6 cm in the left lower lobe observed on image 65 series 3. A left upper lobe masslike process peripherally measures 5.5 by 3.5 cm on image 50 series 3. Both are roughly similar to the 05/31/2022 exam. The large left pleural effusion is associated with some mild rightward shift of cardiac and mediastinal structures. Malignant effusion is a distinct possibility. Musculoskeletal: Lytic and primarily sclerotic lesion of the right anterior sixth rib noted on image 124 series 5, previously highly hypermetabolic on PET-CT, overall increased sclerosis and lucency in this vicinity from 05/31/2022, compatible with progressive osseous metastatic disease. Sclerotic osseous lesion in the right anterior eighth rib compatible with malignancy, increase conspicuity. Multiple sclerotic osseous metastatic lesions in the thoracic spine including the T5,  T6, T10, and T12 levels compatible with malignancy, increase conspicuity from 05/31/2022. Review of the MIP images confirms the above findings. CT ABDOMEN and PELVIS FINDINGS Hepatobiliary: Unremarkable Pancreas: Unremarkable Spleen: Unremarkable Adrenals/Urinary Tract: Unremarkable Stomach/Bowel: Prominent stool throughout the colon favors constipation. Prominence of stool in the rectal vault favoring fecal impaction. No compelling evidence of stercoral colitis. Vascular/Lymphatic: Atherosclerosis is present, including aortoiliac atherosclerotic disease. No pathologic  intra-abdominal adenopathy. Reproductive: Unremarkable Other: Minimal hazy stranding in the omentum and mesentery likely reflecting faint third spacing of fluid. Musculoskeletal: Soft tissue mass involving the left anterior rectus abdominus and adjacent subcutaneous tissues extending to the cutaneous surface, measuring 2.3 by 2.3 cm on image 24 series 3, previously 3.0 by 3.4 cm on 05/31/2022, and previously hypermetabolic prior PET-CT. Dominant sclerotic metastatic lesion in the L4 vertebral body measures 2.2 by 2.0 cm on image 57 series 7. This lesion was previously about 0.8 cm in diameter on 03/24/2022. New sclerotic lesions in the left iliac bone include a 1.1 by 0.8 cm lesion on image 59 of series 3 and a 0.7 cm lesion posteromedially on image 56 series 3. Review of the MIP images confirms the above findings. IMPRESSION: 1. No current filling defect in the pulmonary arterial tree to suggest active pulmonary embolus. 2. Drowned lung appearance of the left lower lobe with complete consolidation and some volume loss in the left lower lobe, and passive atelectasis of much of the left upper lobe. The cavitary masses in the left upper lobe and left lower lobe are roughly similar to 05/31/2022. 3. Mild hazy interstitial accentuation in both lungs along with faint ground-glass opacities, potentially from edema or atypical pneumonia. 4. Large left pleural effusion is associated with some mild rightward shift of cardiac and mediastinal structures. Malignant effusion is a distinct possibility. 5. Increased conspicuity and size of scattered osseous metastatic lesions. 6. The previously hypermetabolic left anterior abdominal wall mass has reduced in size compared to 05/31/2022. 7. Stable spiculated right lower lobe pulmonary nodule (previously hypermetabolic). 8. Prominent stool throughout the colon favors constipation. Prominence of stool in the rectal vault favoring fecal impaction. No compelling evidence of stercoral  colitis. 9. Aortic atherosclerosis. Aortic Atherosclerosis (ICD10-I70.0) and Emphysema (ICD10-J43.9). Electronically Signed   By: Van Clines M.D.   On: 08/31/2022 14:33   CT ABDOMEN PELVIS W CONTRAST  Result Date: 08/31/2022 CLINICAL DATA:  Stage IV bronchogenic carcinoma. Acute pulmonary embolus in November 2023. Current chemotherapy and radiation therapy. Difficulty breathing and possible sepsis. * Tracking Code: BO * EXAM: CT ANGIOGRAPHY CHEST CT ABDOMEN AND PELVIS WITH CONTRAST TECHNIQUE: Multidetector CT imaging of the chest was performed using the standard protocol during bolus administration of intravenous contrast. Multiplanar CT image reconstructions and MIPs were obtained to evaluate the vascular anatomy. Multidetector CT imaging of the abdomen and pelvis was performed using the standard protocol during bolus administration of intravenous contrast. RADIATION DOSE REDUCTION: This exam was performed according to the departmental dose-optimization program which includes automated exposure control, adjustment of the mA and/or kV according to patient size and/or use of iterative reconstruction technique. CONTRAST:  160m OMNIPAQUE IOHEXOL 350 MG/ML SOLN COMPARISON:  Multiple exams, including CTA chest 05/31/2022 and PET-CT 03/24/2022 FINDINGS: CTA CHEST FINDINGS Cardiovascular: No current filling defect in the pulmonary arterial tree to suggest active pulmonary embolus. Coronary, aortic arch, and branch vessel atherosclerotic vascular disease. Mediastinum/Nodes: Right paratracheal node 0.9 cm in short axis on image 37 series 3, formerly the same. Right eccentric subcarinal adenopathy 1.4 cm in short axis  on image 47 series 3, previously 1.8 cm. Lungs/Pleura: Severe emphysema. 1.9 by 1.4 cm spiculated right lower lobe pulmonary nodule on image 95 series 5, very ill-defined margins, previously about 1.8 by 1.4 cm on 05/31/2022. Interstitial accentuation and hazy ground-glass opacity in the right lung  and aerated portion of the left lung. Drowned lung appearance of the left lower lobe with complete consolidation and some volume loss in the left lower lobe. There is likewise passive atelectasis of much of the left upper lobe. The cavitary masses in the left upper lobe and left lower lobe are somewhat obscured, but hypodensity suggesting underlying mass measuring 6.6 by 5.6 cm in the left lower lobe observed on image 65 series 3. A left upper lobe masslike process peripherally measures 5.5 by 3.5 cm on image 50 series 3. Both are roughly similar to the 05/31/2022 exam. The large left pleural effusion is associated with some mild rightward shift of cardiac and mediastinal structures. Malignant effusion is a distinct possibility. Musculoskeletal: Lytic and primarily sclerotic lesion of the right anterior sixth rib noted on image 124 series 5, previously highly hypermetabolic on PET-CT, overall increased sclerosis and lucency in this vicinity from 05/31/2022, compatible with progressive osseous metastatic disease. Sclerotic osseous lesion in the right anterior eighth rib compatible with malignancy, increase conspicuity. Multiple sclerotic osseous metastatic lesions in the thoracic spine including the T5, T6, T10, and T12 levels compatible with malignancy, increase conspicuity from 05/31/2022. Review of the MIP images confirms the above findings. CT ABDOMEN and PELVIS FINDINGS Hepatobiliary: Unremarkable Pancreas: Unremarkable Spleen: Unremarkable Adrenals/Urinary Tract: Unremarkable Stomach/Bowel: Prominent stool throughout the colon favors constipation. Prominence of stool in the rectal vault favoring fecal impaction. No compelling evidence of stercoral colitis. Vascular/Lymphatic: Atherosclerosis is present, including aortoiliac atherosclerotic disease. No pathologic intra-abdominal adenopathy. Reproductive: Unremarkable Other: Minimal hazy stranding in the omentum and mesentery likely reflecting faint third spacing  of fluid. Musculoskeletal: Soft tissue mass involving the left anterior rectus abdominus and adjacent subcutaneous tissues extending to the cutaneous surface, measuring 2.3 by 2.3 cm on image 24 series 3, previously 3.0 by 3.4 cm on 05/31/2022, and previously hypermetabolic prior PET-CT. Dominant sclerotic metastatic lesion in the L4 vertebral body measures 2.2 by 2.0 cm on image 57 series 7. This lesion was previously about 0.8 cm in diameter on 03/24/2022. New sclerotic lesions in the left iliac bone include a 1.1 by 0.8 cm lesion on image 59 of series 3 and a 0.7 cm lesion posteromedially on image 56 series 3. Review of the MIP images confirms the above findings. IMPRESSION: 1. No current filling defect in the pulmonary arterial tree to suggest active pulmonary embolus. 2. Drowned lung appearance of the left lower lobe with complete consolidation and some volume loss in the left lower lobe, and passive atelectasis of much of the left upper lobe. The cavitary masses in the left upper lobe and left lower lobe are roughly similar to 05/31/2022. 3. Mild hazy interstitial accentuation in both lungs along with faint ground-glass opacities, potentially from edema or atypical pneumonia. 4. Large left pleural effusion is associated with some mild rightward shift of cardiac and mediastinal structures. Malignant effusion is a distinct possibility. 5. Increased conspicuity and size of scattered osseous metastatic lesions. 6. The previously hypermetabolic left anterior abdominal wall mass has reduced in size compared to 05/31/2022. 7. Stable spiculated right lower lobe pulmonary nodule (previously hypermetabolic). 8. Prominent stool throughout the colon favors constipation. Prominence of stool in the rectal vault favoring fecal impaction. No compelling  evidence of stercoral colitis. 9. Aortic atherosclerosis. Aortic Atherosclerosis (ICD10-I70.0) and Emphysema (ICD10-J43.9). Electronically Signed   By: Van Clines M.D.    On: 08/31/2022 14:33   DG Chest Port 1 View  Result Date: 08/31/2022 CLINICAL DATA:  Lung cancer with ongoing chemotherapy and radiation therapy, breathing difficulty, questionable sepsis EXAM: PORTABLE CHEST 1 VIEW COMPARISON:  Portable exam 1102 hours compared to 06/01/2022 FINDINGS: Upper normal size of cardiac silhouette. Mediastinal contours and pulmonary vascularity normal. Chronic interstitial infiltrates. Persistent opacities in the LEFT mid lung and LEFT lower lobe with increased LEFT pleural effusion and LEFT lung volume loss since previous study. No pneumothorax or acute osseous findings. IMPRESSION: Chronic interstitial lung disease. Persistent LEFT lung opacities in LEFT upper and LEFT lower lobes with increased LEFT pleural effusion and basilar atelectasis since prior study. Electronically Signed   By: Lavonia Dana M.D.   On: 08/31/2022 11:10    Labs:  CBC: Recent Labs    09/03/22 0405 09/05/22 0346 09/07/22 1003 09/08/22 0436  WBC 10.9* 10.4 11.8* 9.1  HGB 11.1* 11.5* 11.1* 9.9*  HCT 37.1* 38.0* 36.5* 32.1*  PLT 306 287 315 267    COAGS: Recent Labs    04/18/22 1359 05/31/22 1308 06/01/22 0400 06/01/22 1159 06/02/22 0413 08/31/22 1146 09/01/22 0157  INR 1.1  --   --   --   --  2.1* 1.9*  APTT 36   < > 68* 74* 84* 42*  --    < > = values in this interval not displayed.    BMP: Recent Labs    09/03/22 0405 09/05/22 0346 09/07/22 1003 09/08/22 0436  NA 140 132* 131* 133*  K 3.7 4.5 3.6 3.6  CL 105 98 97* 97*  CO2 '25 28 26 30  '$ GLUCOSE 113* 109* 130* 101*  BUN 10 7* 7* 8  CALCIUM 10.3 10.1 9.7 9.7  CREATININE 0.61 0.70 0.67 0.60*  GFRNONAA >60 >60 >60 >60    LIVER FUNCTION TESTS: Recent Labs    08/31/22 1146 09/01/22 0157 09/07/22 1003 09/08/22 0436  BILITOT 1.0 0.6 0.6 0.8  AST '22 15 17 15  '$ ALT '14 13 10 11  '$ ALKPHOS 129* 116 120 96  PROT 7.6 7.2 7.2 6.5  ALBUMIN 2.8* 2.7* 2.5* 2.2*    TUMOR MARKERS: No results for input(s): "AFPTM",  "CEA", "CA199", "CHROMGRNA" in the last 8760 hours.  Assessment and Plan:  Recurrent left pleural effusion in the setting of lung cancer An extensive discussion regarding the patient's goals of care, his current status/symptoms, and details of PleurX catheter placement were discussed with the patient. Mr Vardeman is not currently experiencing shortness of breath, though decreased lung sounds on the left chest, along with CXR from 3/7, seem to demonstrate the recurrence of the patient's pleural effusion. It was explained in depth that the patient has several options regarding continued care with IR, and that IR can offer continued thoracentesis when patient is symptomatic, that IR can offer PleurX placement, or that the patient can opt to decline continued IR care. The patient voiced his understanding of these options and opted to decline PleurX placement at this time, and stated that he would have hospice team arrange further thoracentesis procedures or possible PleurX placement in the future if/when he becomes short of breath again. Given this information, no current plans for PleurX patient, and IR will continue to be available if thoracentesis is desired by the patient. Please contact IR team with any questions or concerns.   Thank  you for this interesting consult.  I greatly enjoyed meeting [employer] and look forward to participating in their care.  A copy of this report was sent to the requesting provider on this date.  Electronically Signed: Lura Em, PA-C 09/08/2022, 3:30 PM   I spent a total of 55 [place]   in face to face in clinical consultation, greater than 50% of which was counseling/coordinating care for recurrent left pleural effusion.

## 2022-09-08 NOTE — Progress Notes (Signed)
Roger [name]                                                                                                                                                                                                          Daily Progress Note   Patient Name: Roger [name]       Date: 09/08/2022 DOB: 04/06/1956  Age: 67 y.o. 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  Imaging on admission noted large left pleural effusion associated with mild rightward shift of cardiac and mediastinal structures which can be suggestive of a malignant effusion.There was also evidence of increase in size and scattered osseous mets.   IR was consulted for thoracentesis.  Patient also receiving medical management for concerns of CAP versus postobstructive pneumonia. Cardiology also consulted due to A-fib with RVR.  Palliative medicine team consulted to assist with complex medical decision making.     - thoracentesis on 2/29 with 1 L fluid off- pathology negative for malignant cells -thoracentesis on 3/2 with 2.1 L fluid off  Subjective: Met with patient. He notes that he spoke with Dr. Julien [name] yesterday and understands he isn't going to have further chemotherapy.  We discussed the plan for his care after discharge.  The differences between Palliative and Hospice was discussed.  He agrees to hospice.  I also spoke with his sister [name] and updated her on plan. She is  thankful he has agreed to hospice.     Vital Signs: BP 103/73 (BP Location: Right Arm)   Pulse 94   Temp 97.8 F (36.6 C) (Oral)   Resp 18   Ht '5\' 11"'$  (1.803 m)   Wt 57.4 kg   SpO2 100%   BMI 17.64 kg/m  SpO2: SpO2: 100 % O2 Device: O2 Device: Nasal Cannula O2 Flow Rate: O2 Flow Rate (L/min): 3 L/min  Intake/output summary:  Intake/Output Summary (Last 24 hours) at 09/08/2022 1425 Last data filed at 09/08/2022 0918 Gross per 24 hour  Intake 477 ml  Output 850 ml  Net -373 ml  LBM: Last BM Date : 09/06/22 Baseline Weight: Weight: 59.9 kg Most recent weight: Weight: 57.4 kg       Palliative Assessment/Data: PPS: 40%      Patient Active Problem List   Diagnosis Date Noted   Atrial fibrillation with RVR ([place]) [id number]   Acute systolic heart failure ([place]) 09/03/2022   Palliative care encounter 09/02/2022   Metastatic malignant neoplasm ([place]) 09/02/2022   Goals of care, counseling/discussion 09/02/2022   DNR (do not resuscitate) 09/02/2022   SCC of lung (small cell carcinoma), left ([place]) 09/02/2022   Pleural effusion, left 09/02/2022   Acute hypoxic respiratory failure ([place]) 09/02/2022   Persistent atrial fibrillation ([place]) 09/02/2022   Secondary hypercoagulable state ([place]) 09/02/2022   Pressure injury of skin 09/02/2022   Protein-calorie malnutrition, severe 09/01/2022   Pleural effusion 08/31/2022   Hypoxia 05/31/2022   Acute pulmonary embolism ([place]) 05/31/2022   DVT (deep venous thrombosis) ([place]) 05/31/2022   HLD (hyperlipidemia) 05/31/2022   Hyperglycemia 05/31/2022   Edema 05/23/2022   Primary squamous cell carcinoma of left lung ([place]) 05/03/2022   Palpitations 10/17/2018   PVC (premature ventricular contraction) 10/17/2018   Essential hypertension 10/17/2018   COPD GOLD 0 still smoking  07/13/2015   Hemoptysis 06/11/2015   CAP (community acquired pneumonia) 06/11/2015   Lung nodules 06/11/2015   Cigarette smoker 03/19/2014   Generalized anxiety  disorder 11/09/2011    Palliative Care Assessment & Plan    Assessment/Recommendations/Plan  Plan for discharge home with hospice TOC order placed for hospice referral Pleurex cath procedure pending On discharge, would recommend scripts for: - Morphine Concentrate '10mg'$ /0.56m: '5mg'$  (0.239m sublingual every 1 hour as needed for pain or shortness of breath: Disp 3083m Lorazepam '2mg'$ /ml concentrated solution: '1mg'$  (0.5ml67mublingual every 4 hours as needed for anxiety: Disp 30ml66maldol '2mg'$ /ml solution: 0.'5mg'$  (0.25ml)72mlingual every 4 hours as needed for agitation or nausea: Disp 30ml  53me Status: DNR  Prognosis:  < 3 months  Discharge Planning: Home with Hospice  Care plan was discussed with patient and his family.   Thank you for allowing the Palliative Medicine Team to assist in the care of this patient.  Total time:  80 minutes  Greater than 50%  of this time was spent counseling and coordinating care related to the above assessment and plan.  Chandler Stofer MMariana KaufmanC Palliative Medicine   Please contact Palliative Medicine Team phone at [phone] and concerns.

## 2022-09-08 NOTE — Progress Notes (Signed)
[facility]) Hospital Liaison Note   Received request from [facility], [name], for hospice services at home after discharge.   Spoke with patient at bedside to initiate education related to hospice philosophy, services, and team approach to care. Roger [name] verbalized understanding of information given.    No DME requested at this time.    Please send signed and completed DNR home with patient/family. Please provide prescriptions at discharge as needed to ensure ongoing symptom management.    [facility] information and contact numbers given to family & above information shared with TOC.   Please call with any questions/concerns.    Thank you for the opportunity to participate in this patient's care.   [name]  [facility] Liaison  [phone]

## 2022-09-08 NOTE — Progress Notes (Signed)
Nutrition Follow-up  DOCUMENTATION CODES:   Severe malnutrition in context of chronic illness  INTERVENTION:  - Carb Modified diet per MD.  - Ensure Plus High Protein po BID, each supplement provides 350 kcal and 20 grams of protein. - Ensure Max po once dialy, each supplement provides 150 kcal and 30 grams of protein.  - Encourage intake at all meals of protein rich food sources in addition to supplement consumption.  - Continue Remeron as medically appropriate, can act as an appetite stimulant. - Monitor weight trends.    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 *ongoing  GOAL:   Patient will meet greater than or equal to 90% of their needs *unmet   MONITOR:   PO intake, Supplement acceptance, Weight trends  REASON FOR ASSESSMENT:   Consult Assessment of nutrition requirement/status  ASSESSMENT:   67 y.o. 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 Found to have pleural effusion with acute on chronic respiratory failure.  Patient reports his appetite remains very poor but he has been trying to eat well. Intake varies daily. Sometimes patient eats well with 50-75% of the meal but sometimes he skips meals.  Has been drinking Ensure daily.  Encouraged patient to always order something at each meal and to drink Ensure when offered to support meeting increased calorie and protein needs.   Medications reviewed and include: Insulin, Remeron, Miralax, Senokot  Labs reviewed:  Na 133 HA1C 6.7 Blood Glucose 99-173 x24 hours   Diet Order:   Diet Order             Diet Carb Modified Fluid consistency: Thin; Room service appropriate? Yes  Diet effective now                   EDUCATION NEEDS:   Education needs have been addressed  Skin:  Skin Assessment: Reviewed RN Assessment Skin Integrity Issues:: Stage II Stage II: Sacrum  Last BM:  3/5  Height:  Ht Readings from Last 1 Encounters:  08/31/22 '5\' 11"'$  (1.803 m)   Weight:  Wt Readings from Last 1 Encounters:  09/08/22 57.4 kg    BMI:  Body mass index is 17.64 kg/m.  Estimated Nutritional Needs:  Kcal:  2000-2300 kcals Protein:  90-115 grams Fluid:  >/= 2L    Samson Frederic RD, LDN For contact information, refer to [facility].

## 2022-09-08 NOTE — Evaluation (Signed)
Occupational Therapy Evaluation Patient Details Name: Roger [name] MRN: [id number] DOB: 1955-12-09 Today's Date: 09/08/2022   History of Present Illness 67 yo male admitted with pleural effusion, acute on chronic resp failure, pna, weakness. Hx of stage IV met lung ca, COPD, anxiety, DVT, medical noncompliance, pressur ulcer   Clinical Impression   Pt is typically independent in ADL and mobility. Today he presents with generalized weakness and overall deconditioning. He required 2 L of supplemental O2 and with activity SpO2 dropped to 88% and he required cues for PLB. He is min guard for ambulation and seated ADL for LB and UB. OT will follow acutely with emphasis on activity tolerance, education for energy conservation, and ensure proper DME for home. At this time recommend shower chair. Next therapist will confirm home set up information. OT will continue to follow acutely.       Recommendations for follow up therapy are one component of a multi-disciplinary discharge planning process, led by the attending physician.  Recommendations may be updated based on patient status, additional functional criteria and insurance authorization.   Follow Up Recommendations  Home health OT     Assistance Recommended at Discharge Set up Supervision/Assistance  Patient can return home with the following A little help with bathing/dressing/bathroom;Assistance with cooking/housework;Assist for transportation;Help with stairs or ramp for entrance    Functional Status Assessment  Patient has had a recent decline in their functional status and demonstrates the ability to make significant improvements in function in a reasonable and predictable amount of time.  Equipment Recommendations  Tub/shower seat    Recommendations for Other Services PT consult     Precautions / Restrictions Precautions Precautions: Fall Precaution Comments: O2 Restrictions Weight Bearing Restrictions: No      Mobility Bed  Mobility Overal bed mobility: Modified Independent                  Transfers Overall transfer level: Needs assistance   Transfers: Sit to/from Stand Sit to Stand: Min guard                  Balance Overall balance assessment: Needs assistance Sitting-balance support: Feet supported Sitting balance-Leahy Scale: Fair Sitting balance - Comments: unchallenged, with back support   Standing balance support: Single extremity supported, During functional activity Standing balance-Leahy Scale: Poor                             ADL either performed or assessed with clinical judgement   ADL Overall ADL's : Needs assistance/impaired Eating/Feeding: Modified independent;Sitting   Grooming: Wash/dry hands;Wash/dry face;Standing Grooming Details (indicate cue type and reason): standing at sink Upper Body Bathing: Set up;Sitting   Lower Body Bathing: Set up;Sitting/lateral leans Lower Body Bathing Details (indicate cue type and reason): decreased activity tolerance for standing activities Upper Body Dressing : Minimal assistance;Sitting Upper Body Dressing Details (indicate cue type and reason): for gown Lower Body Dressing: Min guard;Sit to/from stand   Toilet Transfer: Min guard;Ambulation (pushes IV pole)   Toileting- Clothing Manipulation and Hygiene: Min guard;Sit to/from stand       Functional mobility during ADLs: Min guard (pushing IV pole) General ADL Comments: decreased activity tolerance     Vision Baseline Vision/History: 1 Wears glasses (reading only) Ability to See in Adequate Light: 0 Adequate Patient Visual Report: No change from baseline Vision Assessment?: No apparent visual deficits     Perception     Praxis  Pertinent Vitals/Pain Pain Assessment Pain Assessment: No/denies pain     Hand Dominance     Extremity/Trunk Assessment Upper Extremity Assessment Upper Extremity Assessment: Generalized weakness   Lower Extremity  Assessment Lower Extremity Assessment: Defer to PT evaluation   Cervical / Trunk Assessment Cervical / Trunk Assessment: Normal   Communication Communication Communication: No difficulties   Cognition Arousal/Alertness: Awake/alert Behavior During Therapy: WFL for tasks assessed/performed Overall Cognitive Status: Within Functional Limits for tasks assessed                                       General Comments  VSS throughout session, on 2L throughout session SpO2 down to 88% durnig standing activity at sink and short in room ambulation - not SOB but episodes of coughing    Exercises     Shoulder Instructions      Home Living Family/patient expects to be discharged to:: Private residence Living Arrangements: Other relatives Available Help at Discharge: Family Type of Home: House                       Home Equipment: None   Additional Comments: likes to go by [employer] [place], enjoys outdoors, [employer]      Prior Functioning/Environment Prior Level of Function : Independent/Modified Independent                        OT Problem List: Decreased strength;Decreased activity tolerance;Impaired balance (sitting and/or standing);Decreased safety awareness;Decreased knowledge of use of DME or AE;Cardiopulmonary status limiting activity      OT Treatment/Interventions: Self-care/ADL training;Therapeutic exercise;DME and/or AE instruction;Therapeutic activities;Patient/family education;Balance training    OT Goals(Current goals can be found in the care plan section) Acute Rehab OT Goals Patient Stated Goal: get back to his church OT Goal Formulation: With patient Time For Goal Achievement: 09/22/22 Potential to Achieve Goals: Good ADL Goals Pt Will Perform Grooming: with modified independence;standing Pt Will Perform Upper Body Dressing: with modified independence;sitting Pt Will Perform Lower Body Dressing: with modified independence;sit  to/from stand Pt Will Transfer to Toilet: with modified independence;ambulating Pt Will Perform Toileting - Clothing Manipulation and hygiene: with modified independence;sit to/from stand Pt Will Perform Tub/Shower Transfer: Tub transfer;with modified independence;ambulating;shower seat Additional ADL Goal #1: Pt will verbalize at least 3 strategies for energy conservation during ADL with no cues  OT Frequency: Min 2X/week    Co-evaluation              AM-PAC OT "6 Clicks" Daily Activity     Outcome Measure Help from another person eating meals?: None Help from another person taking care of personal grooming?: A Little Help from another person toileting, which includes using toliet, bedpan, or urinal?: A Little Help from another person bathing (including washing, rinsing, drying)?: A Little Help from another person to put on and taking off regular upper body clothing?: A Little Help from another person to put on and taking off regular lower body clothing?: A Little 6 Click Score: 19   End of Session Equipment Utilized During Treatment: Gait belt;Oxygen (2L) Nurse Communication: Mobility status;Precautions  Activity Tolerance: Patient tolerated treatment well Patient left: in bed;with call bell/phone within reach;with bed alarm set  OT Visit Diagnosis: Unsteadiness on feet (R26.81);Other abnormalities of gait and mobility (R26.89);Muscle weakness (generalized) (M62.81);Adult, failure to thrive (R62.7)  Time: [id number] OT Time Calculation (min): 41 min Charges:  OT General Charges $OT Visit: 1 Visit OT Evaluation $OT Eval Moderate Complexity: 1 Mod OT Treatments $Self Care/Home Management : 23-37 mins $Therapeutic Activity: 8-22 mins  Jesse Sans OTR/L Acute Rehabilitation Services Office: [facility] 09/08/2022, 11:48 AM

## 2022-09-09 ENCOUNTER — Ambulatory Visit: Payer: BC Managed Care – PPO

## 2022-09-09 ENCOUNTER — Ambulatory Visit (HOSPITAL_COMMUNITY): Payer: BC Managed Care – PPO

## 2022-09-09 DIAGNOSIS — I4891 Unspecified atrial fibrillation: Secondary | ICD-10-CM | POA: Diagnosis not present

## 2022-09-09 DIAGNOSIS — J9601 Acute respiratory failure with hypoxia: Secondary | ICD-10-CM | POA: Diagnosis not present

## 2022-09-09 DIAGNOSIS — J90 Pleural effusion, not elsewhere classified: Secondary | ICD-10-CM | POA: Diagnosis not present

## 2022-09-09 DIAGNOSIS — I5021 Acute systolic (congestive) heart failure: Secondary | ICD-10-CM | POA: Diagnosis not present

## 2022-09-09 LAB — CBC WITH DIFFERENTIAL/PLATELET
Abs Immature Granulocytes: 0.06 10*3/uL (ref 0.00–0.07)
Basophils Absolute: 0 10*3/uL (ref 0.0–0.1)
Basophils Relative: 0 %
Eosinophils Absolute: 0.2 10*3/uL (ref 0.0–0.5)
Eosinophils Relative: 2 %
HCT: 34.3 % — ABNORMAL LOW (ref 39.0–52.0)
Hemoglobin: 10.4 g/dL — ABNORMAL LOW (ref 13.0–17.0)
Immature Granulocytes: 1 %
Lymphocytes Relative: 7 %
Lymphs Abs: 0.7 10*3/uL (ref 0.7–4.0)
MCH: 28 pg (ref 26.0–34.0)
MCHC: 30.3 g/dL (ref 30.0–36.0)
MCV: 92.5 fL (ref 80.0–100.0)
Monocytes Absolute: 0.8 10*3/uL (ref 0.1–1.0)
Monocytes Relative: 7 %
Neutro Abs: 9 10*3/uL — ABNORMAL HIGH (ref 1.7–7.7)
Neutrophils Relative %: 83 %
Platelets: 266 10*3/uL (ref 150–400)
RBC: 3.71 MIL/uL — ABNORMAL LOW (ref 4.22–5.81)
RDW: 15.4 % (ref 11.5–15.5)
WBC: 10.8 10*3/uL — ABNORMAL HIGH (ref 4.0–10.5)
nRBC: 0 % (ref 0.0–0.2)

## 2022-09-09 LAB — MAGNESIUM: Magnesium: 1.9 mg/dL (ref 1.7–2.4)

## 2022-09-09 LAB — COMPREHENSIVE METABOLIC PANEL
ALT: 12 U/L (ref 0–44)
AST: 20 U/L (ref 15–41)
Albumin: 2.3 g/dL — ABNORMAL LOW (ref 3.5–5.0)
Alkaline Phosphatase: 122 U/L (ref 38–126)
Anion gap: 7 (ref 5–15)
BUN: 10 mg/dL (ref 8–23)
CO2: 28 mmol/L (ref 22–32)
Calcium: 9.2 mg/dL (ref 8.9–10.3)
Chloride: 95 mmol/L — ABNORMAL LOW (ref 98–111)
Creatinine, Ser: 0.62 mg/dL (ref 0.61–1.24)
GFR, Estimated: >60 mL/min (ref >60)
Glucose, Bld: 154 mg/dL — ABNORMAL HIGH (ref 70–99)
Potassium: 3.9 mmol/L (ref 3.5–5.1)
Sodium: 130 mmol/L — ABNORMAL LOW (ref 135–145)
Total Bilirubin: 0.4 mg/dL (ref 0.3–1.2)
Total Protein: 6.7 g/dL (ref 6.5–8.1)

## 2022-09-09 LAB — GLUCOSE, CAPILLARY
Glucose-Capillary: 105 mg/dL — ABNORMAL HIGH (ref 70–99)
Glucose-Capillary: 107 mg/dL — ABNORMAL HIGH (ref 70–99)
Glucose-Capillary: 116 mg/dL — ABNORMAL HIGH (ref 70–99)
Glucose-Capillary: 167 mg/dL — ABNORMAL HIGH (ref 70–99)
Glucose-Capillary: 80 mg/dL (ref 70–99)
Glucose-Capillary: 88 mg/dL (ref 70–99)

## 2022-09-09 LAB — PHOSPHORUS: Phosphorus: 2.2 mg/dL — ABNORMAL LOW (ref 2.5–4.6)

## 2022-09-09 MED ORDER — ENSURE MAX PROTEIN PO LIQD: 11.0000 [oz_av] | Freq: Every day | ORAL | 0 refills | Status: DC

## 2022-09-09 MED ORDER — MIDODRINE HCL 2.5 MG PO TABS: 2.5000 mg | ORAL_TABLET | Freq: Three times a day (TID) | ORAL | 0 refills | Status: DC

## 2022-09-09 MED ORDER — SENNOSIDES-DOCUSATE SODIUM 8.6-50 MG PO TABS: 1.0000 | ORAL_TABLET | Freq: Two times a day (BID) | ORAL | 0 refills | Status: DC

## 2022-09-09 MED ORDER — AMIODARONE HCL 200 MG PO TABS: 200.0000 mg | ORAL_TABLET | Freq: Every day | ORAL | 0 refills | Status: DC

## 2022-09-09 MED ORDER — ENSURE ENLIVE PO LIQD: 237.0000 mL | Freq: Two times a day (BID) | ORAL | 12 refills | Status: DC

## 2022-09-09 MED ORDER — POLYETHYLENE GLYCOL 3350 17 G PO PACK: 17.0000 g | PACK | Freq: Every day | ORAL | 0 refills | Status: DC

## 2022-09-09 MED ORDER — POTASSIUM & SODIUM PHOSPHATES 280-160-250 MG PO PACK
1.0000 | PACK | Freq: Three times a day (TID) | ORAL | Status: DC
  Administered 2022-09-09 – 2022-09-11 (×6): 1 via ORAL
  Filled 2022-09-09 (×9): qty 1

## 2022-09-09 MED ORDER — HALOPERIDOL LACTATE 2 MG/ML PO CONC: 0.6000 mg | ORAL | 0 refills | Status: DC | PRN

## 2022-09-09 MED ORDER — ONDANSETRON HCL 4 MG PO TABS: 4.0000 mg | ORAL_TABLET | Freq: Four times a day (QID) | ORAL | 0 refills | Status: DC | PRN

## 2022-09-09 MED ORDER — SACCHAROMYCES BOULARDII 250 MG PO CAPS: 250.0000 mg | ORAL_CAPSULE | Freq: Two times a day (BID) | ORAL | 0 refills | Status: DC

## 2022-09-09 MED ORDER — MORPHINE SULFATE (CONCENTRATE) 10 MG /0.5 ML PO SOLN: 5.0000 mg | ORAL | 0 refills | Status: DC | PRN

## 2022-09-09 MED ORDER — LORAZEPAM 2 MG/ML PO CONC: 1.0000 mg | ORAL | 0 refills | Status: DC | PRN

## 2022-09-09 NOTE — Progress Notes (Signed)
[facility] [address] [facility]) Hospital Liaison Note  Met with patient at bedside and called sister, Inez Catalina, by phone to discuss hospice services and answer questions.  Per patient, plan is for discharge tomorrow by ambulance, to assist with getting him in the home. Patient declined Pleurex catheter placement at this time.   Please ensure prescriptions accompany patient to ensure comfort at time of discharge. PMT has recommended:   Morphine Concentrate '10mg'$ /0.40m: '5mg'$  (0.231m sublingual every 1 hour as needed for pain or shortness of breath: Disp 3068m Lorazepam '2mg'$ /ml concentrated solution: '1mg'$  (0.5ml42mublingual every 4 hours as needed for anxiety: Disp 30ml92maldol '2mg'$ /ml solution: 0.'5mg'$  (0.25ml)88mlingual every 4 hours as needed for agitation or nausea: Disp 30ml  88mse send completed and signed DNR with patient at discharge.  Please call with any hospice related questions.  Thank you, Tracy EMargaretmary EddyRN [facility] [phone]

## 2022-09-09 NOTE — Plan of Care (Signed)
  Problem: Education: Goal: Knowledge of General Education information will improve Description: Including pain rating scale, medication(s)/side effects and non-pharmacologic comfort measures Outcome: Progressing   Problem: Coping: Goal: Level of anxiety will decrease Outcome: Progressing   Problem: Safety: Goal: Ability to remain free from injury will improve Outcome: Progressing   

## 2022-09-09 NOTE — Progress Notes (Signed)
PT Cancellation Note  Patient Details Name: Roger [name] MRN: [id number] DOB: 26-Oct-1955   Cancelled Treatment:      Pt respectfully declined to participate with therapy today.  Pt indicated he wanted to rest prior to d/c home tomorrow (09/10/2022). Pt stated he anticipates it will be "a lot" to transition. Pt has elected to d/c home with hospice services.   Baird Lyons, PT   Roger [name] 09/09/2022, 11:54 AM

## 2022-09-09 NOTE — Progress Notes (Signed)
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 He was brought to the ED hypotensive 77/51, chest x-ray chronic interstitial disease persistent left lung opacities EKG with ?A-fib junctional escape.  Labs with mild leukocytosis anemia CT PE no PE, pulmonary stool favoring constipation, previous hypermetabolic left anterior abdominal wall mass has reduced in size large left pleural effusion, malignant effusion is a distinct possibility, drowned lung appearance of the left lung with complete consolidation and some volume loss in the left lower lobe and passive atelectasis, the cavitary mass in the left upper lobe and left lower lobes are roughly similar to 05/31/2022, mild hazy interstitial accentuation in both lungs along with faint groundglass opacities potentially from edema or atypical pneumonia. Patient was admitted for acute on chronic respiratory failure with left sided pleural effusion large with background stage IV non-small cell lung cancer, CAP versus postobstructive pneumonia, hypotension lactic acidosis electrolyte imbalance   Interim History He has had multiple thoracentesis while he has been here in on A-fib with RVR and had to be placed on amiodarone drip and cardiology consulted.  Medical oncology is evaluated and feels that he would not benefit from further chemotherapy and after further goals of care discussion patient has elected for home hospice and refused a Pleurx catheter.  Plan is to discharge home with hospice in the a.m.  Assessment and Plan:  Acute Hypoxic Respiratory Failure from large left side Pleural effusion on  O2 PRN at home, Stage IV  non-small cell lung cancer: -CT chest in the ED with large left pleural effusion, rightward shift of cardiac and mediastinal structure suggestive of malignant effusion.  -S/P thoracentesis on 2/29 with 1liter removed, fluids culture no growth, gram stain negative, -S/p thoracentesis on 3/3 with 2 liter removed.Likely malignant-although cytology negative for malignancy, chest x-ray with persistent left pleural effusion, but overall not short of breath he remains weak and deconditioned and frail.   -If he decides on hospice he may qualify with Pleurx catheter placement.  -Will need home oxygen.   -Sent message to Dr. Julien Nordmann to clarify regarding his further chemo.   -Patient mentions that his oncologist has been talking about giving 4 cycles after which Keytruda -Dr. Earlie Server evaluated and patient is status post a course of palliative radiotherapy and underwent cycle systemic chemotherapy with carboplatin, paclitaxel and Keytruda in November.  He was unable to resume his first treatment after his first cycle and has been noncompliant with his appointment for chemotherapy. -Oncology evaluated and do not think that he would be a great candidate to resume any systemic chemotherapy at this point because of intolerance and noncompliance and I strongly recommend the patient to consider palliative care and hospice after discharge from the hospital.  From a recurrent left pleural effusion he may benefit of Pleurx catheter placement before discharge for drainage of fluid; IR consulted for further evaluation of Pleurx catheter drainage and discussed options with the patient and at this time he opted to decline Pleurx catheter placement and stated that he would have the hospice team arrange further thoracentesis procedures for possible Pleurx catheter placement in the future if and when he becomes short of  breath again -Hospice has met with the patient given that he is elected for home hospice services and we will plan  on discharging the patient in the a.m. and provide this prescription at discharge to ensure ongoing symptom management (Prescriptions provided to Pharmacy at the [facility])   CAP vs Post obstructive PNA  with associated sepsis/lactic acidosis -MRSA neg.Urine strep pneumo negative.  Bld Cx NGTD> Cont unasyn to finish total of 7 days abx treatment.  -Repeat CXR done yesterday AM done and showed "New right lower lobe heterogeneous opacities, concerning for infection aspiration. Unchanged consolidation of the left lower lobe and moderate left pleural effusion."   Hypotension/soft blood pressure -Started on midodrine, atenolol on hold.  Random cortisol level 19    Lactic Acidosis -Multifactorial resolved.   Atrial Fibrillation diagnosed in December 2023 on Eliquis at home on hold, started on amiodarone drip on 3/2 per cards recommendation -Per my discussion with Cards able to transition to po Amiodarone 400 mg po BID x3 days (Last day today) and Amiodarone 200 mg po daily there afterwords    DVT /PE -Diagnosed in 05/31/2022 cont Apixaban 5 mg po BID   Electrolyte Disturbances Recent Labs  Lab 09/01/22 0157 09/01/22 0157 09/02/22 0446 09/03/22 0405 09/05/22 0346 09/07/22 1003 09/08/22 0436 09/09/22 1444  NA 132*  --  136 140 132* 131* 133* 130*  K 3.0*  --  3.0* 3.7 4.5 3.6 3.6 3.9  MG  --    < > 1.7  --  1.7 2.0 2.0 1.9  CALCIUM 10.5*  --  10.3 10.3 10.1 9.7 9.7 9.2  PHOS  --    < > 2.9  --   --  3.1 2.5 2.2*   < > = values in this interval not displayed.  -Relete with po Phos-NAK   Constipation -Cont stool softeners.   Anxiety on chronic Xanax cut down his Xanax to  0.25 mg 3 times daily as he has been sleeping and nursing has been holding the Xanax; change him to oral lorazepam as delineated by the palliative care note for discharge   Stage IV non-small cell lung cancer Goals of care: -Squamous cell carcinoma presented with bilateral pulmonary masses and nodules  as well as mediastinal and bilateral hilar adenopathy in addition to subcutaneous metastatic disease and bone metastasis diagnosed in October 2023 with PD-L1 expression of 1%.    -S/p palliative radiotherapy to the painful abdominal lesion under the care of Dr. Sondra Come. Systemic chemotherapy with carboplatin for AUC of 5, paclitaxel 175 Mg/M2 and Keytruda 200 Mg IV every 3 weeks with Neulasta support status post 1 cycle which was given on May 09, 2022 , appeared that was the only chemo he received so far.  -Based upon last oncology note patient was offered hospice/palliative care but he wanted to continue the treatment but he has been very weak he went to have a follow-up and outpatient CT scan, he is at risk of decompensation, palliative care has been engaged may benefit with hospice care-sister understands and dr. -Dr. Julien Nordmann evaluated and does not feel he is great candidate to resume resume systemic chemotherapy at this point because of intolerance and noncompliance and strongly recommends for the patient to consider palliative care and hospice after discharge from the hospital **After further GOC Discussion with palliative care to the differences between valve and hospice were discussed and he is agreed to hospice at home and IR was consulted to evaluate for Pleurx catheter and he did decline  at this time -TOC has been ordered to place for hospice referral and on discharge we will send him out with morphine concentrate 10 mg 0.5 mL 5 mg sublingual every 1 hour as needed for pain and shortness of breath, lorazepam 2 mg/ML concentrate solution 1 mg sublingually every 4 hours as needed for anxiety as well as Haldol 2 mg/mL solution 0.5 mill grams lingually every 4 hours as needed for agitation and nausea   Failure to thrive with overall poor prognosis Severe malnutrition augment diet  Nutrition Status: Nutrition Problem: Severe Malnutrition Etiology: chronic illness (SCC of left lung with mets to  bone) Signs/Symptoms: severe fat depletion, severe muscle depletion, percent weight loss (24% in 7 months) Percent weight loss: 24 % (in 7 months) Interventions: Ensure Enlive (each supplement provides 350kcal and 20 grams of protein), Prostat, Refer to RD note for recommendations   Pressure ulcer POA on sacrum stage II PER BELOW. Pressure Injury 08/31/22 Sacrum Stage 2 -  Partial thickness loss of dermis presenting as a shallow open injury with a red, pink wound bed without slough. red, open (Active)  08/31/22 1752  Location: Sacrum  Location Orientation:   Staging: Stage 2 -  Partial thickness loss of dermis presenting as a shallow open injury with a red, pink wound bed without slough.  Wound Description (Comments): red, open  Present on Admission: Yes    Normocytic Anemia -Hgb/Hct: Recent Labs  Lab 09/01/22 0157 09/02/22 0446 09/03/22 0405 09/05/22 0346 09/07/22 1003 09/08/22 0436 09/09/22 1444  HGB 10.2* 10.7* 11.1* 11.5* 11.1* 9.9* 10.4*  HCT 33.8* 34.8* 37.1* 38.0* 36.5* 32.1* 34.3*  MCV 92.3 91.8 95.1 91.8 93.1 90.4 92.5  -Check Anemia Panel in the AM -Continue to Monitor for S/Sx of Bleeding; No overt bleeding noted -Repeat CBC in the AM   Hypoalbuminemia -Patient's Albumin Trend: Recent Labs  Lab 08/31/22 1146 09/01/22 0157 09/07/22 1003 09/08/22 0436 09/09/22 1444  ALBUMIN 2.8* 2.7* 2.5* 2.2* 2.3*  -Continue to Monitor and Trend and repeat CMP in the AM  DVT prophylaxis:  apixaban (ELIQUIS) tablet 5 mg    Code Status: DNR Family Communication: No family currently at bedside  Disposition Plan:  Level of care: Progressive Status is: Inpatient Remains inpatient appropriate because: Will be discharged to home hospice in the morning   Consultants:  Medical oncology Palliative care Interventional radiology Cardiology  Procedures:  Thoracentesis x2   Antimicrobials:  Anti-infectives (From admission, onward)    Start     Dose/Rate Route Frequency  Ordered Stop   09/03/22 2000  Ampicillin-Sulbactam (UNASYN) 3 g in sodium chloride 0.9 % 100 mL IVPB        3 g 200 mL/hr over 30 Minutes Intravenous Every 6 hours 09/03/22 1403 09/07/22 0216   09/01/22 2000  Ampicillin-Sulbactam (UNASYN) 3 g in sodium chloride 0.9 % 100 mL IVPB  Status:  Discontinued        3 g 200 mL/hr over 30 Minutes Intravenous Every 6 hours 09/01/22 1817 09/03/22 1327   09/01/22 1000  vancomycin (VANCOREADY) IVPB 1250 mg/250 mL  Status:  Discontinued        1,250 mg 166.7 mL/hr over 90 Minutes Intravenous Every 24 hours 08/31/22 1710 09/01/22 1248   08/31/22 2200  ceFEPIme (MAXIPIME) 2 g in sodium chloride 0.9 % 100 mL IVPB  Status:  Discontinued        2 g 200 mL/hr over 30 Minutes Intravenous Every 8 hours 08/31/22 1652 09/01/22 1817   08/31/22 1330  vancomycin (VANCOCIN) IVPB 1000 mg/200 mL premix        1,000 mg 200 mL/hr over 60 Minutes Intravenous  Once 08/31/22 1316 08/31/22 1553   08/31/22 1315  ceFEPIme (MAXIPIME) 2 g in sodium chloride 0.9 % 100 mL IVPB        2 g 200 mL/hr over 30 Minutes Intravenous  Once 08/31/22 1306 08/31/22 1448       Subjective: Seen and examined at bedside and was doing okay today.  Wanted to see what his lab work was before he gets discharged tomorrow.  Denies any other concerns or complaints at this time.  Does not request any equipment for home except a walker.  Objective: Vitals:   09/09/22 0453 09/09/22 0455 09/09/22 0900 09/09/22 1300  BP: 104/74  108/77 104/74  Pulse: 89  83 98  Resp: 17   18  Temp: 98.1 F (36.7 C)   98.5 F (36.9 C)  TempSrc: Oral   Oral  SpO2: 97%     Weight:  58.4 kg    Height:        Intake/Output Summary (Last 24 hours) at 09/09/2022 1634 Last data filed at 09/09/2022 1513 Gross per 24 hour  Intake 720 ml  Output 1600 ml  Net -880 ml   Filed Weights   09/07/22 0500 09/08/22 0455 09/09/22 0455  Weight: 60.4 kg 57.4 kg 58.4 kg   Examination: Physical Exam:  Constitutional: Thin  chronically ill-appearing Caucasian male in no acute distress Respiratory: Diminished to auscultation bilaterally with coarse breath sounds and has some rhonchi and crackles no appreciable wheezing or rales.  Has normal respiratory effort and is not tachypneic but is wearing supplemental oxygen via nasal cannula. Cardiovascular: Irregularly irregular, no murmurs / rubs / gallops. S1 and S2 auscultated.  Very mild extremity edema Abdomen: Soft, non-tender, non-distended. No masses palpated. No appreciable hepatosplenomegaly. Bowel sounds positive.  GU: Deferred. Musculoskeletal: No clubbing / cyanosis of digits/nails. No joint deformity upper and lower extremities.  Skin: No rashes, lesions, ulcers on a limited skin evaluation. No induration; Warm and dry.  Neurologic: CN 2-12 grossly intact with no focal deficits. Romberg sign cerebellar reflexes not assessed.  Psychiatric: Normal judgment and insight. Alert and oriented x 3. Normal mood and appropriate affect.   Data Reviewed: I have personally reviewed following labs and imaging studies  CBC: Recent Labs  Lab 09/03/22 0405 09/05/22 0346 09/07/22 1003 09/08/22 0436 09/09/22 1444  WBC 10.9* 10.4 11.8* 9.1 10.8*  NEUTROABS 9.1*  --  9.8* 7.4 9.0*  HGB 11.1* 11.5* 11.1* 9.9* 10.4*  HCT 37.1* 38.0* 36.5* 32.1* 34.3*  MCV 95.1 91.8 93.1 90.4 92.5  PLT 306 287 315 267 [id number]   Basic Metabolic Panel: Recent Labs  Lab 09/03/22 0405 09/05/22 0346 09/07/22 1003 09/08/22 0436 09/09/22 1444  NA 140 132* 131* 133* 130*  K 3.7 4.5 3.6 3.6 3.9  CL 105 98 97* 97* 95*  CO2 '25 28 26 30 28  '$ GLUCOSE 113* 109* 130* 101* 154*  BUN 10 7* 7* 8 10  CREATININE 0.61 0.70 0.67 0.60* 0.62  CALCIUM 10.3 10.1 9.7 9.7 9.2  MG  --  1.7 2.0 2.0 1.9  PHOS  --   --  3.1 2.5 2.2*   GFR: Estimated Creatinine Clearance: 76 mL/min (by C-G formula based on SCr of 0.62 mg/dL). Liver Function Tests: Recent Labs  Lab 09/07/22 1003 09/08/22 0436 09/09/22 1444   AST '17 15 20  '$ ALT '10 11 12  '$ ALKPHOS  120 96 122  BILITOT 0.6 0.8 0.4  PROT 7.2 6.5 6.7  ALBUMIN 2.5* 2.2* 2.3*   No results for input(s): "LIPASE", "AMYLASE" in the last 168 hours. No results for input(s): "AMMONIA" in the last 168 hours. Coagulation Profile: No results for input(s): "INR", "PROTIME" in the last 168 hours. Cardiac Enzymes: No results for input(s): "CKTOTAL", "CKMB", "CKMBINDEX", "TROPONINI" in the last 168 hours. BNP (last 3 results) No results for input(s): "PROBNP" in the last 8760 hours. HbA1C: No results for input(s): "HGBA1C" in the last 72 hours. CBG: Recent Labs  Lab 09/08/22 2059 09/09/22 0005 09/09/22 0448 09/09/22 0744 09/09/22 1115  GLUCAP 185* 80 116* 105* 167*   Lipid Profile: No results for input(s): "CHOL", "HDL", "LDLCALC", "TRIG", "CHOLHDL", "LDLDIRECT" in the last 72 hours. Thyroid Function Tests: No results for input(s): "TSH", "T4TOTAL", "FREET4", "T3FREE", "THYROIDAB" in the last 72 hours. Anemia Panel: No results for input(s): "VITAMINB12", "FOLATE", "FERRITIN", "TIBC", "IRON", "RETICCTPCT" in the last 72 hours. Sepsis Labs: No results for input(s): "PROCALCITON", "LATICACIDVEN" in the last 168 hours.  Recent Results (from the past 240 hour(s))  Blood Culture (routine x 2)     Status: None   Collection Time: 08/31/22 11:46 AM   Specimen: BLOOD RIGHT FOREARM  Result Value Ref Range Status   Specimen Description   Final    BLOOD RIGHT FOREARM Performed at [facility] [address], [place], [place] [address]    Special Requests   Final    BOTTLES DRAWN AEROBIC AND ANAEROBIC Blood Culture results may not be optimal due to an inadequate volume of blood received in culture bottles Performed at [facility] [address], [place], [place] [address]    Culture   Final    NO GROWTH 5 DAYS Performed at [facility], [place] [address] [place], [place] [address]    Report Status 09/05/2022  FINAL  Final  Blood Culture (routine x 2)     Status: None   Collection Time: 08/31/22 11:46 AM   Specimen: BLOOD LEFT FOREARM  Result Value Ref Range Status   Specimen Description   Final    BLOOD LEFT FOREARM Performed at [facility] [address] [place], [place] [address]    Special Requests   Final    BOTTLES DRAWN AEROBIC AND ANAEROBIC Blood Culture results may not be optimal due to an inadequate volume of blood received in culture bottles Performed at [facility] [address] [place], [place] [address]    Culture   Final    NO GROWTH 5 DAYS Performed at [facility], [place] [address], [place], [place] [address]    Report Status 09/05/2022 FINAL  Final  MRSA Next Gen by PCR, Nasal     Status: None   Collection Time: 09/01/22  7:58 AM   Specimen: Nasal Mucosa; Nasal Swab  Result Value Ref Range Status   MRSA by PCR Next Gen NOT DETECTED NOT DETECTED Final    Comment: (NOTE) The GeneXpert MRSA Assay (FDA approved for NASAL specimens only), is one component of a comprehensive MRSA colonization surveillance program. It is not intended to diagnose MRSA infection nor to guide or monitor treatment for MRSA infections. Test performance is not FDA approved in patients less than 41 years old. Performed at [facility], [place] [address], [place],  [address]   Body fluid culture w Gram Stain     Status: None  Collection Time: 09/01/22 12:21 PM   Specimen: PATH Cytology Peritoneal fluid  Result Value Ref Range Status   Specimen Description   Final    PERITONEAL Performed at [facility], [address], [place], [place] [address]    Special Requests   Final    NONE Performed at [facility], [place] [address], [place], [place] [address]    Gram Stain   Final    WBC PRESENT, PREDOMINANTLY MONONUCLEAR NO ORGANISMS SEEN CYTOSPIN SMEAR    Culture   Final    NO GROWTH Performed at  [facility], [place] [address] [place], [place] [address]    Report Status 09/05/2022 FINAL  Final     Radiology Studies: DG CHEST PORT 1 VIEW  Result Date: 09/08/2022 CLINICAL DATA:  Shortness of breath EXAM: PORTABLE CHEST 1 VIEW COMPARISON:  Chest x-ray dated September 07 2022 FINDINGS: Visualized cardiac and mediastinal contours are unchanged. New right lower lobe heterogeneous opacities. Unchanged consolidation of the left lower lobe. Stable moderate left pleural effusion. No evidence of pneumothorax. IMPRESSION: 1. New right lower lobe heterogeneous opacities, concerning for infection aspiration. 2. Unchanged consolidation of the left lower lobe and moderate left pleural effusion. Electronically Signed   By: Yetta Glassman M.D.   On: 09/08/2022 08:12    Scheduled Meds:  ALPRAZolam  0.25 mg Oral TID   amiodarone  400 mg Oral BID   Followed by   Derrill Memo ON 09/10/2022] amiodarone  200 mg Oral Daily   apixaban  5 mg Oral BID   atorvastatin  10 mg Oral QHS   feeding supplement  237 mL Oral BID BM   insulin aspart  0-6 Units Subcutaneous Q4H   midodrine  2.5 mg Oral TID WC   mirtazapine  15 mg Oral QHS   polyethylene glycol  17 g Oral Daily   Ensure Max Protein  11 oz Oral Daily   saccharomyces boulardii  250 mg Oral BID   senna-docusate  1 tablet Oral BID   Continuous Infusions:   LOS: 9 days   Raiford Noble, DO [facility] Available via Epic secure chat 7am-7pm After these hours, please refer to coverage provider listed on [email or web address] 09/09/2022, 4:34 PM

## 2022-09-10 DIAGNOSIS — I5021 Acute systolic (congestive) heart failure: Secondary | ICD-10-CM | POA: Diagnosis not present

## 2022-09-10 DIAGNOSIS — J9601 Acute respiratory failure with hypoxia: Secondary | ICD-10-CM | POA: Diagnosis not present

## 2022-09-10 DIAGNOSIS — I4891 Unspecified atrial fibrillation: Secondary | ICD-10-CM | POA: Diagnosis not present

## 2022-09-10 DIAGNOSIS — J90 Pleural effusion, not elsewhere classified: Secondary | ICD-10-CM | POA: Diagnosis not present

## 2022-09-10 LAB — GLUCOSE, CAPILLARY
Glucose-Capillary: 103 mg/dL — ABNORMAL HIGH (ref 70–99)
Glucose-Capillary: 120 mg/dL — ABNORMAL HIGH (ref 70–99)
Glucose-Capillary: 129 mg/dL — ABNORMAL HIGH (ref 70–99)
Glucose-Capillary: 173 mg/dL — ABNORMAL HIGH (ref 70–99)
Glucose-Capillary: 204 mg/dL — ABNORMAL HIGH (ref 70–99)
Glucose-Capillary: 73 mg/dL (ref 70–99)

## 2022-09-10 LAB — TOTAL BILIRUBIN, BODY FLUID: Total bilirubin, fluid: 0.4 mg/dL

## 2022-09-10 NOTE — Progress Notes (Signed)
[facility]                                                                                                                                                                                                          Daily Progress Note   Patient Name: Roger [name]       Date: 09/10/2022 DOB: 11-25-1955  Age: 67 y.o. MRN#: [id number] Attending Physician: Kerney Elbe, DO Primary Care Physician: Aletha Halim., PA-C Admit Date: 08/31/2022  Reason for Consultation/Follow-up: Establishing goals of care  Patient Profile/HPI:  Patient is a 68 year-old male patient is a 67 year old male with a past medical history of SCC of the left lung with metastatic disease to bone - s/p radiation treatments- received one round of chemotherapy in November of 2023 but has not received any further treatment since then-, hypertension, COPD, anxiety, DVT, and hyperlipidemia who was admitted on 08/11/2022 for management of shortness of breath.  Imaging on admission noted large left pleural effusion associated with mild rightward shift of cardiac and mediastinal structures which can be suggestive of a malignant effusion.There was also evidence of increase in size and scattered osseous mets.   IR was consulted for thoracentesis.  Patient also receiving medical management for concerns of CAP versus postobstructive pneumonia. Cardiology also consulted due to A-fib with RVR.  Palliative medicine team consulted to assist with complex medical decision making.     - thoracentesis on 2/29 with 1 L fluid off- pathology negative for malignant cells -thoracentesis on 3/2 with 2.1 L fluid off  Patient has had multiple thoracentesis while he has been here in on A-fib with RVR and had to be placed on amiodarone drip and cardiology consulted.  Medical oncology has evaluated and feels that he would not benefit from further chemotherapy and after further goals of care discussion patient has elected for home hospice and refused a Pleurx catheter.   Plan is to discharge home with hospice on 09-10-22.   Subjective: Awake alert resting in bed, denies pain/acute dyspnea or any uncontrolled symptoms.  Patient is aware that he is going home with hospice today.       Vital Signs: BP (!) 116/103 (BP Location: Right Arm)   Pulse (!) 106   Temp 98.3 F (36.8 C) (Oral)   Resp 18   Ht '5\' 11"'$  (1.803 m)   Wt 53.5 kg   SpO2 97%   BMI 16.45 kg/m  SpO2: SpO2: 97 % O2 Device: O2 Device: Nasal Cannula O2 Flow Rate: O2 Flow Rate (L/min): 3 L/min  Intake/output summary:  Intake/Output Summary (Last 24 hours) at 09/10/2022 0850 Last data filed at 09/10/2022 0809 Gross per 24 hour  Intake 720 ml  Output 2075 ml  Net -1355 ml   LBM: Last BM Date : 09/06/22 Baseline Weight: Weight: 59.9 kg Most recent weight: Weight: 53.5 kg       Palliative Assessment/Data: PPS: 40%      Patient Active Problem List   Diagnosis Date Noted   Atrial fibrillation with RVR ([place]) [id number]   Acute systolic heart failure ([place]) 09/03/2022   Palliative care encounter 09/02/2022   Metastatic malignant neoplasm ([place]) 09/02/2022   Goals of care, counseling/discussion 09/02/2022   DNR (do not resuscitate) 09/02/2022   SCC of lung (small cell carcinoma), left ([place]) 09/02/2022   Pleural effusion, left 09/02/2022   Acute hypoxic respiratory failure ([place]) 09/02/2022   Persistent atrial fibrillation ([place]) 09/02/2022   Secondary hypercoagulable state ([place]) 09/02/2022   Pressure injury of skin 09/02/2022   Protein-calorie malnutrition, severe 09/01/2022   Pleural effusion 08/31/2022   Hypoxia 05/31/2022   Acute pulmonary embolism ([place]) 05/31/2022   DVT (deep venous thrombosis) ([place]) 05/31/2022   HLD (hyperlipidemia) 05/31/2022   Hyperglycemia 05/31/2022   Edema 05/23/2022   Primary squamous cell carcinoma of left lung ([place]) 05/03/2022   Palpitations 10/17/2018   PVC (premature ventricular contraction) 10/17/2018   Essential hypertension 10/17/2018   COPD GOLD  0 still smoking  07/13/2015   Hemoptysis 06/11/2015   CAP (community acquired pneumonia) 06/11/2015   Lung nodules 06/11/2015   Cigarette smoker 03/19/2014   Generalized anxiety disorder 11/09/2011    Palliative Care Assessment & Plan    Assessment/Recommendations/Plan  Plan for discharge home with hospice TOC order placed for hospice referral: chart reviewed, appreciate TOC and [facility] hospice arrangements.  Pleurex cath procedure pending On discharge, would recommend scripts for: - Morphine Concentrate '10mg'$ /0.4m: '5mg'$  (0.249m sublingual every 1 hour as needed for pain or shortness of breath: Disp 3060m Lorazepam '2mg'$ /ml concentrated solution: '1mg'$  (0.5ml21mublingual every 4 hours as needed for anxiety: Disp 30ml43maldol '2mg'$ /ml solution: 0.'5mg'$  (0.25ml)16mlingual every 4 hours as needed for agitation or nausea: Disp 30ml  20me Status: DNR  Prognosis:  < 3 months  Discharge Planning: Home with Hospice  Care plan was discussed with patient    Thank you for allowing the Palliative Medicine Team to assist in the care of this patient.  Low MDM  Greater than 50%  of this time was spent counseling and coordinating care related to the above assessment and plan.  Kongmeng Santoro [facility]   Please contact Palliative Medicine Team phone at [phone] and concerns.

## 2022-09-10 NOTE — Plan of Care (Signed)
  Problem: Clinical Measurements: Goal: Will remain free from infection Outcome: Progressing   Problem: Coping: Goal: Level of anxiety will decrease Outcome: Progressing   Problem: Activity: Goal: Risk for activity intolerance will decrease Outcome: Not Progressing

## 2022-09-10 NOTE — Progress Notes (Signed)
[id number]) Hospice hospital liaison note  Patient is referred for hospice services at home after discharge. Patient initially verbalized no DME needs however today upon bedside visit patient requested hospital bed, overbed table, shower chair and rollator. This was coordinated with patient's sister and will be delivered today.   Current plan is for discharge from the hospital tomorrow via EMS.   Thank you for the opportunity to participate in this patient's care Jhonnie Garner, BSN, RN Hospice hospital liaison [phone]

## 2022-09-10 NOTE — Discharge Summary (Signed)
Physician Discharge Summary   Patient: Roger [name] MRN: [id number] DOB: 1955/07/09  Admit date:     08/31/2022  Discharge date: 09/10/22  Discharge Physician: Raiford Noble, DO   PCP: Aletha Halim., PA-C   Recommendations at discharge:   Further care per hospice protocol  Discharge Diagnoses: Principal Problem:   Pleural effusion Active Problems:   Protein-calorie malnutrition, severe   Palliative care encounter   Metastatic malignant neoplasm ([place])   Goals of care, counseling/discussion   DNR (do not resuscitate)   SCC of lung (small cell carcinoma), left (HCC)   Pleural effusion, left   Acute hypoxic respiratory failure ([place])   Persistent atrial fibrillation ([place])   Secondary hypercoagulable state ([place])   Pressure injury of skin   Atrial fibrillation with RVR ([place])   Acute systolic heart failure ([place])  Resolved Problems:   * No resolved hospital problems. 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 He was brought to the ED hypotensive 77/51, chest x-ray chronic interstitial disease persistent left lung opacities EKG with ?A-fib junctional escape.  Labs with mild leukocytosis anemia CT PE no PE, pulmonary stool favoring constipation, previous hypermetabolic left anterior abdominal wall mass has reduced in size large left pleural effusion, malignant effusion is a distinct possibility, drowned lung appearance of the left lung with complete consolidation and some volume loss in the left lower lobe and passive atelectasis, the cavitary mass in the left upper lobe and left lower lobes are roughly similar to 05/31/2022, mild hazy interstitial accentuation in both lungs along with faint groundglass opacities potentially from edema or atypical  pneumonia. Patient was admitted for acute on chronic respiratory failure with left sided pleural effusion large with background stage IV non-small cell lung cancer, CAP versus postobstructive pneumonia, hypotension lactic acidosis electrolyte imbalance   Interim History He has had multiple thoracentesis while he has been here in on A-fib with RVR and had to be placed on amiodarone drip and cardiology consulted.  Medical oncology is evaluated and feels that he would not benefit from further chemotherapy and after further goals of care discussion patient has elected for home hospice and refused a Pleurx catheter.  Plan was to discharge the patient home with hospice this morning however after hospice met with the patient he initially had verbalized that he had no DME needs but upon the hospice bedside evaluation he requested a hospital bed, over bed table, shower chair and rollator and current plan is to discharge from the hospital tomorrow via EMS once the hospice equipment has been delivered.  He is medically stable for discharge and will need further care per hospice protocol  Assessment and Plan:  Acute Hypoxic Respiratory Failure from large left side Pleural effusion on  O2 PRN at home, Stage IV non-small cell lung cancer: -CT chest in the ED with large left pleural effusion, rightward shift of cardiac and mediastinal structure suggestive of malignant effusion.  -S/P thoracentesis on 2/29 with 1liter removed, fluids culture no growth, gram stain negative, -S/p thoracentesis on 3/3 with 2 liter removed.Likely malignant-although cytology negative for malignancy, chest x-ray with persistent left pleural effusion, but overall not short of breath he remains weak and deconditioned and frail.   -If he decides on hospice he may qualify  with Pleurx catheter placement.  -Will need home oxygen.   -Sent message to Dr. Julien Nordmann to clarify regarding his further chemo.   -Patient mentions that his oncologist has  been talking about giving 4 cycles after which Keytruda -Dr. Earlie Server evaluated and patient is status post a course of palliative radiotherapy and underwent cycle systemic chemotherapy with carboplatin, paclitaxel and Keytruda in November.  He was unable to resume his first treatment after his first cycle and has been noncompliant with his appointment for chemotherapy. -Oncology evaluated and do not think that he would be a great candidate to resume any systemic chemotherapy at this point because of intolerance and noncompliance and I strongly recommend the patient to consider palliative care and hospice after discharge from the hospital.  From a recurrent left pleural effusion he may benefit of Pleurx catheter placement before discharge for drainage of fluid; IR consulted for further evaluation of Pleurx catheter drainage and discussed options with the patient and at this time he opted to decline Pleurx catheter placement and stated that he would have the hospice team arrange further thoracentesis procedures for possible Pleurx catheter placement in the future if and when he becomes short of breath again -Hospice has met with the patient given that he is elected for home hospice services and we will plan on discharging the patient in the a.m. once equipment is delivered -Provided this prescription at discharge to ensure ongoing symptom management (Prescriptions provided to Pharmacy at the Palliative Care recc's)   CAP vs Post obstructive PNA  with associated sepsis/lactic acidosis -MRSA neg.Urine strep pneumo negative.  Bld Cx NGTD> Cont unasyn to finish total of 7 days abx treatment.  -Repeat CXR done yesterday AM done and showed "New right lower lobe heterogeneous opacities, concerning for infection aspiration. Unchanged consolidation of the left lower lobe and moderate left pleural effusion."   Hypotension/soft blood pressure -Started on midodrine, atenolol on hold okay to resume at discharge.  Random  cortisol level 19    Lactic Acidosis -Multifactorial resolved.   Atrial Fibrillation diagnosed in December 2023 on Eliquis at home on hold, started on amiodarone drip on 3/2 per cards recommendation -Per my discussion with Cards able to transition to po Amiodarone 400 mg po BID x3  and Amiodarone 200 mg po daily there afterwords has now been initiated on 200 mg   DVT /PE -Diagnosed in 05/31/2022 cont Apixaban 5 mg po BID   Electrolyte Disturbances Recent Labs  Lab 09/01/22 0157 09/01/22 0157 09/02/22 0446 09/03/22 0405 09/05/22 0346 09/07/22 1003 09/08/22 0436 09/09/22 1444  NA 132*  --  136 140 132* 131* 133* 130*  K 3.0*  --  3.0* 3.7 4.5 3.6 3.6 3.9  MG  --    < > 1.7  --  1.7 2.0 2.0 1.9  CALCIUM 10.5*  --  10.3 10.3 10.1 9.7 9.7 9.2  PHOS  --    < > 2.9  --   --  3.1 2.5 2.2*   < > = values in this interval not displayed.  -Relete with po Phos-NAK will not replete further given that he is going home with hospice tomorrow   Constipation -Cont stool softeners.   Anxiety on chronic Xanax cut down his Xanax to  0.25 mg 3 times daily as he has been sleeping and nursing has been holding the Xanax; change him to oral lorazepam as delineated by the palliative care note for discharge   Stage IV non-small cell lung cancer Goals of care: -Squamous  cell carcinoma presented with bilateral pulmonary masses and nodules as well as mediastinal and bilateral hilar adenopathy in addition to subcutaneous metastatic disease and bone metastasis diagnosed in October 2023 with PD-L1 expression of 1%.    -S/p palliative radiotherapy to the painful abdominal lesion under the care of Dr. Sondra Come. Systemic chemotherapy with carboplatin for AUC of 5, paclitaxel 175 Mg/M2 and Keytruda 200 Mg IV every 3 weeks with Neulasta support status post 1 cycle which was given on May 09, 2022 , appeared that was the only chemo he received so far.  -Based upon last oncology note patient was offered  hospice/palliative care but he wanted to continue the treatment but he has been very weak he went to have a follow-up and outpatient CT scan, he is at risk of decompensation, palliative care has been engaged may benefit with hospice care-sister understands and dr. -Dr. Julien Nordmann evaluated and does not feel he is great candidate to resume resume systemic chemotherapy at this point because of intolerance and noncompliance and strongly recommends for the patient to consider palliative care and hospice after discharge from the hospital **After further GOC Discussion with palliative care to the differences between valve and hospice were discussed and he is agreed to hospice at home and IR was consulted to evaluate for Pleurx catheter and he did decline at this time -TOC has been ordered to place for hospice referral and on discharge we will send him out with morphine concentrate 10 mg 0.5 mL 5 mg sublingual every 1 hour as needed for pain and shortness of breath, lorazepam 2 mg/ML concentrate solution 1 mg sublingually every 4 hours as needed for anxiety as well as Haldol 2 mg/mL solution 0.5 mill grams lingually every 4 hours as needed for agitation and nausea -Patient is stable for discharge per hospice however equipment has not been set up so discharge has been delayed until tomorrow 09/11/2022   Failure to thrive with overall poor prognosis Severe malnutrition augment diet  Nutrition Status: Nutrition Problem: Severe Malnutrition Etiology: chronic illness (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 Pressure Injury 08/31/22 Sacrum Stage 2 -  Partial thickness loss of dermis presenting as a  shallow open injury with a red, pink wound bed without slough. red, open (Active)  08/31/22 1752  Location: Sacrum  Location Orientation:   Staging: Stage 2 -  Partial thickness loss of dermis presenting as a shallow open injury with a red, pink wound bed without slough.  Wound Description (Comments): red, open  Present on Admission: Yes    Normocytic Anemia -Hgb/Hct: Recent Labs  Lab 09/01/22 0157 09/02/22 0446 09/03/22 0405 09/05/22 0346 09/07/22 1003 09/08/22 0436 09/09/22 1444  HGB 10.2* 10.7* 11.1* 11.5* 11.1* 9.9* 10.4*  HCT 33.8* 34.8* 37.1* 38.0* 36.5* 32.1* 34.3*  MCV 92.3 91.8 95.1 91.8 93.1 90.4 92.5  -Continue to Monitor for S/Sx of Bleeding; No overt bleeding noted   Hypoalbuminemia -Patient's Albumin Trend: Recent Labs  Lab 08/31/22 1146 09/01/22 0157 09/07/22 1003 09/08/22 0436 09/09/22 1444  ALBUMIN 2.8* 2.7* 2.5* 2.2* 2.3*  -Continue to Monitor and Trend and repeat CMP in the AM  Nutrition Documentation    [facility] to Hosp-Admission (Current)  from 08/31/2022 in [facility]  Nutrition Problem Severe Malnutrition  Etiology chronic illness  [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 red, open 9 days           Consultants: Palliative Care, Hospice, Medical Oncology, Cardiology, IR Procedures performed: As delineated as above Disposition: Hospice care Diet recommendation:  Discharge Diet Orders (From admission, onward)     Start     Ordered   09/10/22 0000  Diet -  low sodium heart healthy        09/10/22 1131           Cardiac diet DISCHARGE MEDICATION: Allergies as of 09/10/2022       Reactions   Erythromycin Nausea And Vomiting, Other (See Comments)   GI Intolerance   Prednisone Other (See Comments)   "felt weird"   Sulfa Antibiotics Nausea And Vomiting, Other (See Comments)   Hematemesis (vomiting blood)   Amoxicillin-pot Clavulanate Nausea And Vomiting, Other (See Comments)   GI Intolerance   Meperidine And Related Palpitations, Other (See Comments)   "Heart beats fast"        Medication List     STOP taking these medications    ALPRAZolam 1 MG tablet Commonly known as: XANAX   lidocaine-prilocaine cream Commonly known as: EMLA       TAKE these medications    albuterol 108 (90 Base) MCG/ACT inhaler Commonly known as: VENTOLIN HFA Inhale 2 puffs into the lungs every 6 (six) hours as needed for wheezing or shortness of breath.   amiodarone 200 MG tablet Commonly known as: PACERONE Take 1 tablet (200 mg total) by mouth daily.   apixaban 5 MG Tabs tablet Commonly known as: ELIQUIS Take 1 tablet (5 mg total) by mouth 2 (two) times daily. What changed: Another medication with the same name was removed. Continue taking this medication, and follow the directions you see here.   atenolol 50 MG tablet Commonly known as: TENORMIN Take 50 mg by mouth daily. What changed: Another medication with the same name was removed. Continue taking this medication, and follow the directions you see here.   atorvastatin 10 MG tablet Commonly known as: LIPITOR Take 10 mg by mouth at bedtime.   feeding supplement Liqd Take 237 mLs by mouth 2 (two) times daily between meals.   Ensure Max Protein Liqd Take 330 mLs (11 oz total) by mouth daily.   fluticasone 50 MCG/ACT nasal spray Commonly known as: FLONASE Place 2 sprays into both nostrils daily as needed for allergies.   haloperidol 2 MG/ML solution Commonly known as:  HALDOL Take 0.3 mLs (0.6 mg total) by mouth every 4 (four) hours as needed for agitation.   LORazepam 2 MG/ML concentrated solution Commonly known as: LORazepam Intensol Take 0.5 mLs (1 mg total) by mouth every 4 (four) hours as needed for anxiety.   metFORMIN 500 MG 24 hr tablet Commonly known as: GLUCOPHAGE-XR Take 500 mg by mouth See admin  instructions. Take 500 mg by mouth with the largest meal of the day   midodrine 2.5 MG tablet Commonly known as: PROAMATINE Take 1 tablet (2.5 mg total) by mouth 3 (three) times daily with meals.   mirtazapine 15 MG tablet Commonly known as: REMERON Take 1 tablet (15 mg total) by mouth at bedtime.   morphine CONCENTRATE 10 mg / 0.5 ml concentrated solution Take 0.25 mLs (5 mg total) by mouth every hour as needed for severe pain or shortness of breath.   ondansetron 4 MG tablet Commonly known as: ZOFRAN Take 1 tablet (4 mg total) by mouth every 6 (six) hours as needed for nausea.   polyethylene glycol 17 g packet Commonly known as: MIRALAX / GLYCOLAX Take 17 g by mouth daily.   prochlorperazine 10 MG tablet Commonly known as: COMPAZINE Take 1 tablet (10 mg total) by mouth every 6 (six) hours as needed for nausea or vomiting.   saccharomyces boulardii 250 MG capsule Commonly known as: FLORASTOR Take 1 capsule (250 mg total) by mouth 2 (two) times daily.   senna-docusate 8.6-50 MG tablet Commonly known as: Senokot-S Take 1 tablet by mouth 2 (two) times daily.   TYLENOL 500 MG tablet Generic drug: acetaminophen Take 500-1,000 mg by mouth every 6 (six) hours as needed for mild pain or headache.               Durable Medical Equipment  (From admission, onward)           Start     Ordered   09/09/22 1650  For home use only DME Walker rolling  Once       Question Answer Comment  Walker: With [place] Wheels   Patient needs a walker to treat with the following condition Generalized weakness      09/09/22 1649               Discharge Care Instructions  (From admission, onward)           Start     Ordered   09/10/22 0000  Discharge wound care:       Comments: Frequent Turning   09/10/22 1131            Follow-up Information     Aletha Halim., PA-C Follow up in 1 week(s).   Specialty: Family Medicine Contact information: [address] [place] [address] [phone]                Discharge Exam: Danley Danker Weights   09/08/22 0455 09/09/22 0455 09/10/22 0358  Weight: 57.4 kg 58.4 kg 53.5 kg   Vitals:   09/10/22 0903 09/10/22 1615  BP: 100/69 107/72  Pulse: 97 81  Resp: 19   Temp: 98 F (36.7 C)   SpO2: 97%    Examination: Physical Exam:  Constitutional: Thin chronically ill-appearing Caucasian male in no acute distress Respiratory: Diminished to auscultation bilaterally with coarse breath sounds and has some rhonchi and some crackles noted.  Has a normal respiratory effort and is wearing supplemental oxygen via nasal cannula Cardiovascular: Irregularly irregular and mildly tachycardic, no murmurs / rubs / gallops. S1 and S2 auscultated.  Very slight extremity edema Abdomen: Soft, non-tender, non-distended.  Bowel sounds positive.  GU: Deferred. Musculoskeletal: No clubbing / cyanosis of digits/nails. No joint deformity upper and lower extremities.  Skin: No rashes, lesions, ulcers on limited skin evaluation. No induration; Warm and dry.  Neurologic: CN 2-12 grossly intact with no focal deficits. Romberg sign  and cerebellar reflexes not assessed.  Psychiatric: Normal judgment and insight. Alert and oriented x 3.  Slightly anxious mood and appropriate affect.   Condition at discharge: stable  The results of significant diagnostics from this hospitalization (including imaging, microbiology, ancillary and laboratory) are listed below for reference.   Imaging Studies: DG CHEST PORT 1 VIEW  Result Date: 09/08/2022 CLINICAL DATA:  Shortness of breath EXAM:  PORTABLE CHEST 1 VIEW COMPARISON:  Chest x-ray dated September 07 2022 FINDINGS: Visualized cardiac and mediastinal contours are unchanged. New right lower lobe heterogeneous opacities. Unchanged consolidation of the left lower lobe. Stable moderate left pleural effusion. No evidence of pneumothorax. IMPRESSION: 1. New right lower lobe heterogeneous opacities, concerning for infection aspiration. 2. Unchanged consolidation of the left lower lobe and moderate left pleural effusion. Electronically Signed   By: Yetta Glassman M.D.   On: 09/08/2022 08:12   DG CHEST PORT 1 VIEW  Result Date: 09/07/2022 CLINICAL DATA:  Shortness of breath, pleural effusion EXAM: PORTABLE CHEST 1 VIEW COMPARISON:  Chest radiograph 09/05/2022 FINDINGS: The cardiomediastinal silhouette is stable. The small to moderate-sized left pleural effusion is similar to the prior study. Aeration of the lungs is unchanged, with dense retrocardiac opacity and opacity in the left midlung consistent with known masses seen on prior CT. The right lung is clear. There is no significant right effusion. There is no pneumothorax There is no acute osseous abnormality. IMPRESSION: Overall, no significant interval change in lung aeration since the study from 2 days prior with unchanged left pleural effusion and opacities in the left base and midlung. Electronically Signed   By: Valetta Mole M.D.   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 Request for therapeutic thoracentesis. EXAM: ULTRASOUND GUIDED LEFT THORACENTESIS MEDICATIONS: 1% lidocaine 10 mL COMPLICATIONS: None immediate. PROCEDURE: An ultrasound guided thoracentesis was thoroughly discussed with the patient and questions answered. The benefits, risks, alternatives and complications were also discussed. The patient understands and wishes to proceed with the  procedure. Written consent was obtained. Ultrasound was performed to localize and Jaymir an adequate pocket of fluid in the left chest. The area was then prepped and draped in the normal sterile fashion. 1% Lidocaine was used for local anesthesia. Under ultrasound guidance a 6 Fr Safe-T-Centesis catheter was introduced. Thoracentesis was performed. The catheter was removed and a dressing applied. FINDINGS: A total of approximately 2.1 L of clear amber fluid was removed. IMPRESSION: Successful ultrasound guided left thoracentesis yielding 2.1 L of pleural fluid. No pneumothorax on post-procedure chest x-ray. Procedure performed by: Gareth Eagle, PA-C Electronically Signed   By: Aletta Edouard M.D.   On: 09/06/2022 09:28   DG Chest 2 View  Result Date: 09/05/2022 CLINICAL DATA:  Pleural effusion EXAM: CHEST - 2 VIEW COMPARISON:  Chest radiograph dated 09/03/2022 FINDINGS: Decreased lung aeration. Increased patchy and interstitial opacities, left-greater-than-right with persistent dense left retrocardiac opacity. Similar small to moderate left pleural effusion. No pneumothorax. Left heart border is obscured. The visualized skeletal structures are unremarkable. IMPRESSION: 1. Similar small to moderate left pleural effusion. No pneumothorax. 2. Decreased lung aeration with increased bilateral patchy and interstitial opacities, left-greater-than-right. Persistent dense left retrocardiac opacity in keeping with known cavitary masses. Electronically Signed   By: Darrin Nipper M.D.   On: 09/05/2022 16:08   DG Chest 1 View  Result Date: 09/03/2022 CLINICAL DATA:  Pleural effusion.  Status post thoracentesis. EXAM: CHEST  1 VIEW COMPARISON:  Chest x-ray September 03, 2022 FINDINGS: No pneumothorax after thoracentesis. The left-sided pleural effusion remains but is smaller. The opacity underlying the left pleural effusion is again identified, more conspicuous in the interval given the smaller effusion. Reticular changes in the right  lung base are stable. No other interval changes. IMPRESSION: 1. No pneumothorax after thoracentesis. 2. The left pleural effusion is smaller. 3. The opacity underlying the left pleural effusion is more conspicuous in the interval given the smaller effusion. 4. Reticular changes in the right lung base are stable. Electronically Signed   By: Dorise Bullion III M.D.   On: 09/03/2022 14:42   DG CHEST PORT 1 VIEW  Result Date: 09/03/2022 CLINICAL DATA:  Shortness of breath. EXAM: PORTABLE CHEST 1 VIEW COMPARISON:  09/01/2022 FINDINGS: Large left pleural effusion with underlying pulmonary opacity. Diffuse interstitial coarsening primarily attributed to emphysema. Findings are underestimated relative to recent CT. Normal heart size where not obscured. No pneumothorax IMPRESSION: 1. Unchanged large left pleural effusion and pulmonary opacity. 2. Advanced emphysema. Electronically Signed   By: Jorje Guild M.D.   On: 09/03/2022 08:18   ECHOCARDIOGRAM LIMITED  Result Date: 09/02/2022    ECHOCARDIOGRAM LIMITED REPORT   Patient Name:   TORRIEN MORRISEY Date of Exam: 09/02/2022 Medical Rec #:  [id number]   Height:       71.0 in Accession #:    [id number]  Weight:       127.9 lb Date of Birth:  May 30, 1956  BSA:          1.743 m Patient Age:    49 years    BP:           100/72 mmHg Patient Gender: M           HR:           74 bpm. Exam Location:  [facility] Procedure: Limited Echo, Cardiac Doppler and Limited Color Doppler Indications:    Congestive Heart Failure I50.9  History:        Patient has prior history of Echocardiogram examinations, most                 recent 06/01/2022. Signs/Symptoms:Shortness of Breath; Risk                 Factors:Former Smoker and Hypertension.  Sonographer:    Greer Pickerel Referring Phys: [id number] SARA-MAIZ A THOMAS  Sonographer Comments: Image acquisition challenging due to patient body habitus and Image acquisition challenging due to respiratory motion. IMPRESSIONS  1. Left ventricular  ejection fraction, by estimation, is 45 to 50%. The left ventricle has mildly decreased function. Left ventricular diastolic parameters are indeterminate.  2. Right ventricular systolic function mild to moderately reduced. The right ventricular size is moderately enlarged. Tricuspid regurgitation signal is inadequate for assessing PA pressure.  3. Large pleural effusion in the left lateral region.  4. No evidence of mitral valve regurgitation.  5. Aortic valve regurgitation is not visualized.  6. The inferior vena cava is dilated in size with >50% respiratory variability, suggesting right atrial pressure of 8 mmHg. Comparison(s): Pericardial effusion has improved, plerual effusion is new from prior. FINDINGS  Left Ventricle: Left ventricular ejection fraction, by estimation, is 45 to 50%. The left ventricle has mildly decreased function. The left ventricular internal cavity size was normal in size. There is no left ventricular hypertrophy. Left ventricular diastolic parameters are indeterminate. Right Ventricle: The right ventricular size is  moderately enlarged. Right ventricular systolic function mild to moderately reduced. Tricuspid regurgitation signal is inadequate for assessing PA pressure. Pericardium: Trivial pericardial effusion is present. The pericardial effusion is surrounding the apex. There is excessive respiratory variation in septal movement. Aortic Valve: Aortic valve regurgitation is not visualized. Aorta: The aortic root is normal in size and structure. Venous: The inferior vena cava is dilated in size with greater than 50% respiratory variability, suggesting right atrial pressure of 8 mmHg. Additional Comments: There is a large pleural effusion in the left lateral region. Spectral Doppler performed. Color Doppler performed.  LEFT VENTRICLE PLAX 2D LVIDd:         4.70 cm     Diastology LVIDs:         3.70 cm     LV e' medial:   7.29 cm/s LV PW:         0.70 cm     LV E/e' medial: 5.6 LV IVS:         0.60 cm LVOT diam:     2.20 cm LVOT Area:     3.80 cm  LV Volumes (MOD) LV vol d, MOD A2C: 71.8 ml LV vol d, MOD A4C: 70.9 ml LV vol s, MOD A2C: 42.2 ml LV vol s, MOD A4C: 32.9 ml LV SV MOD A2C:     29.6 ml LV SV MOD A4C:     70.9 ml LV SV MOD BP:      35.3 ml RIGHT VENTRICLE RV S prime:     7.07 cm/s TAPSE (M-mode): 0.8 cm LEFT ATRIUM         Index LA diam:    3.70 cm 2.12 cm/m   AORTA Ao Root diam: 3.45 cm MITRAL VALVE MV Area (PHT): 4.36 cm    SHUNTS MV Decel Time: 174 msec    Systemic Diam: 2.20 cm MV E velocity: 41.10 cm/s MV A velocity: 68.60 cm/s MV E/A ratio:  0.60 Rudean Haskell MD Electronically signed by Rudean Haskell MD Signature Date/Time: 09/02/2022/4:19:24 PM    Final    US THORACENTESIS ASP PLEURAL SPACE W/IMG GUIDE  Result Date: 09/01/2022 INDICATION: Left pleural effusion EXAM: ULTRASOUND GUIDED LEFT therapeutic THORACENTESIS MEDICATIONS: 5 cc 1% lidocaine COMPLICATIONS: None immediate. PROCEDURE: An ultrasound guided thoracentesis was thoroughly discussed with the patient and questions answered. The benefits, risks, alternatives and complications were also discussed. The patient understands and wishes to proceed with the procedure. Written consent was obtained. Ultrasound was performed to localize and Jawann an adequate pocket of fluid in the left chest. The area was then prepped and draped in the normal sterile fashion. 1% Lidocaine was used for local anesthesia. Under ultrasound guidance a 6 Fr Safe-T-Centesis catheter was introduced. Thoracentesis was performed. The catheter was removed and a dressing applied. FINDINGS: A total of approximately 1 L of amber fluid was removed. Ordering provider did not request laboratory samples. IMPRESSION: Successful ultrasound guided left thoracentesis yielding 1 L of pleural fluid. Follow-up chest x-ray revealed no evidence of pneumothorax. Read by: Reatha Armour, PA-C Electronically Signed   By: Corrie Mckusick D.O.   On: 09/01/2022 12:40    DG CHEST PORT 1 VIEW  Result Date: 09/01/2022 CLINICAL DATA:  Post thoracentesis. EXAM: PORTABLE CHEST 1 VIEW COMPARISON:  Chest radiograph and CTA chest 1 day prior FINDINGS: The cardiomediastinal silhouette is grossly stable. There is persistent moderate-to-large left pleural effusion following thoracentesis, decreased in size with slightly improved aeration of the left lung. The right lung is clear. There is  no significant right effusion. There is no appreciable pneumothorax There is no acute osseous abnormality. IMPRESSION: Persistent moderate-to-large left pleural effusion following thoracentesis but with slightly improved aeration of the left lung. No appreciable pneumothorax. Electronically Signed   By: Valetta Mole M.D.   On: 09/01/2022 11:09   CT Angio Chest PE W and/or Wo Contrast  Result Date: 08/31/2022 CLINICAL DATA:  Stage IV bronchogenic carcinoma. Acute pulmonary embolus in November 2023. Current chemotherapy and radiation therapy. Difficulty breathing and possible sepsis. * Tracking Code: BO * EXAM: CT ANGIOGRAPHY CHEST CT ABDOMEN AND PELVIS WITH CONTRAST TECHNIQUE: Multidetector CT imaging of the chest was performed using the standard protocol during bolus administration of intravenous contrast. Multiplanar CT image reconstructions and MIPs were obtained to evaluate the vascular anatomy. Multidetector CT imaging of the abdomen and pelvis was performed using the standard protocol during bolus administration of intravenous contrast. RADIATION DOSE REDUCTION: This exam was performed according to the departmental dose-optimization program which includes automated exposure control, adjustment of the mA and/or kV according to patient size and/or use of iterative reconstruction technique. CONTRAST:  148m OMNIPAQUE IOHEXOL 350 MG/ML SOLN COMPARISON:  Multiple exams, including CTA chest 05/31/2022 and PET-CT 03/24/2022 FINDINGS: CTA CHEST FINDINGS Cardiovascular: No current filling defect in the  pulmonary arterial tree to suggest active pulmonary embolus. Coronary, aortic arch, and branch vessel atherosclerotic vascular disease. Mediastinum/Nodes: Right paratracheal node 0.9 cm in short axis on image 37 series 3, formerly the same. Right eccentric subcarinal adenopathy 1.4 cm in short axis on image 47 series 3, previously 1.8 cm. Lungs/Pleura: Severe emphysema. 1.9 by 1.4 cm spiculated right lower lobe pulmonary nodule on image 95 series 5, very ill-defined margins, previously about 1.8 by 1.4 cm on 05/31/2022. Interstitial accentuation and hazy ground-glass opacity in the right lung and aerated portion of the left lung. Drowned lung appearance of the left lower lobe with complete consolidation and some volume loss in the left lower lobe. There is likewise passive atelectasis of much of the left upper lobe. The cavitary masses in the left upper lobe and left lower lobe are somewhat obscured, but hypodensity suggesting underlying mass measuring 6.6 by 5.6 cm in the left lower lobe observed on image 65 series 3. A left upper lobe masslike process peripherally measures 5.5 by 3.5 cm on image 50 series 3. Both are roughly similar to the 05/31/2022 exam. The large left pleural effusion is associated with some mild rightward shift of cardiac and mediastinal structures. Malignant effusion is a distinct possibility. Musculoskeletal: Lytic and primarily sclerotic lesion of the right anterior sixth rib noted on image 124 series 5, previously highly hypermetabolic on PET-CT, overall increased sclerosis and lucency in this vicinity from 05/31/2022, compatible with progressive osseous metastatic disease. Sclerotic osseous lesion in the right anterior eighth rib compatible with malignancy, increase conspicuity. Multiple sclerotic osseous metastatic lesions in the thoracic spine including the T5, T6, T10, and T12 levels compatible with malignancy, increase conspicuity from 05/31/2022. Review of the MIP images confirms the  above findings. CT ABDOMEN and PELVIS FINDINGS Hepatobiliary: Unremarkable Pancreas: Unremarkable Spleen: Unremarkable Adrenals/Urinary Tract: Unremarkable Stomach/Bowel: Prominent stool throughout the colon favors constipation. Prominence of stool in the rectal vault favoring fecal impaction. No compelling evidence of stercoral colitis. Vascular/Lymphatic: Atherosclerosis is present, including aortoiliac atherosclerotic disease. No pathologic intra-abdominal adenopathy. Reproductive: Unremarkable Other: Minimal hazy stranding in the omentum and mesentery likely reflecting faint third spacing of fluid. Musculoskeletal: Soft tissue mass involving the left anterior rectus abdominus and adjacent subcutaneous tissues  extending to the cutaneous surface, measuring 2.3 by 2.3 cm on image 24 series 3, previously 3.0 by 3.4 cm on 05/31/2022, and previously hypermetabolic prior PET-CT. Dominant sclerotic metastatic lesion in the L4 vertebral body measures 2.2 by 2.0 cm on image 57 series 7. This lesion was previously about 0.8 cm in diameter on 03/24/2022. New sclerotic lesions in the left iliac bone include a 1.1 by 0.8 cm lesion on image 59 of series 3 and a 0.7 cm lesion posteromedially on image 56 series 3. Review of the MIP images confirms the above findings. IMPRESSION: 1. No current filling defect in the pulmonary arterial tree to suggest active pulmonary embolus. 2. Drowned lung appearance of the left lower lobe with complete consolidation and some volume loss in the left lower lobe, and passive atelectasis of much of the left upper lobe. The cavitary masses in the left upper lobe and left lower lobe are roughly similar to 05/31/2022. 3. Mild hazy interstitial accentuation in both lungs along with faint ground-glass opacities, potentially from edema or atypical pneumonia. 4. Large left pleural effusion is associated with some mild rightward shift of cardiac and mediastinal structures. Malignant effusion is a distinct  possibility. 5. Increased conspicuity and size of scattered osseous metastatic lesions. 6. The previously hypermetabolic left anterior abdominal wall mass has reduced in size compared to 05/31/2022. 7. Stable spiculated right lower lobe pulmonary nodule (previously hypermetabolic). 8. Prominent stool throughout the colon favors constipation. Prominence of stool in the rectal vault favoring fecal impaction. No compelling evidence of stercoral colitis. 9. Aortic atherosclerosis. Aortic Atherosclerosis (ICD10-I70.0) and Emphysema (ICD10-J43.9). Electronically Signed   By: Van Clines M.D.   On: 08/31/2022 14:33   CT ABDOMEN PELVIS W CONTRAST  Result Date: 08/31/2022 CLINICAL DATA:  Stage IV bronchogenic carcinoma. Acute pulmonary embolus in November 2023. Current chemotherapy and radiation therapy. Difficulty breathing and possible sepsis. * Tracking Code: BO * EXAM: CT ANGIOGRAPHY CHEST CT ABDOMEN AND PELVIS WITH CONTRAST TECHNIQUE: Multidetector CT imaging of the chest was performed using the standard protocol during bolus administration of intravenous contrast. Multiplanar CT image reconstructions and MIPs were obtained to evaluate the vascular anatomy. Multidetector CT imaging of the abdomen and pelvis was performed using the standard protocol during bolus administration of intravenous contrast. RADIATION DOSE REDUCTION: This exam was performed according to the departmental dose-optimization program which includes automated exposure control, adjustment of the mA and/or kV according to patient size and/or use of iterative reconstruction technique. CONTRAST:  130m OMNIPAQUE IOHEXOL 350 MG/ML SOLN COMPARISON:  Multiple exams, including CTA chest 05/31/2022 and PET-CT 03/24/2022 FINDINGS: CTA CHEST FINDINGS Cardiovascular: No current filling defect in the pulmonary arterial tree to suggest active pulmonary embolus. Coronary, aortic arch, and branch vessel atherosclerotic vascular disease. Mediastinum/Nodes:  Right paratracheal node 0.9 cm in short axis on image 37 series 3, formerly the same. Right eccentric subcarinal adenopathy 1.4 cm in short axis on image 47 series 3, previously 1.8 cm. Lungs/Pleura: Severe emphysema. 1.9 by 1.4 cm spiculated right lower lobe pulmonary nodule on image 95 series 5, very ill-defined margins, previously about 1.8 by 1.4 cm on 05/31/2022. Interstitial accentuation and hazy ground-glass opacity in the right lung and aerated portion of the left lung. Drowned lung appearance of the left lower lobe with complete consolidation and some volume loss in the left lower lobe. There is likewise passive atelectasis of much of the left upper lobe. The cavitary masses in the left upper lobe and left lower lobe are somewhat obscured, but hypodensity  suggesting underlying mass measuring 6.6 by 5.6 cm in the left lower lobe observed on image 65 series 3. A left upper lobe masslike process peripherally measures 5.5 by 3.5 cm on image 50 series 3. Both are roughly similar to the 05/31/2022 exam. The large left pleural effusion is associated with some mild rightward shift of cardiac and mediastinal structures. Malignant effusion is a distinct possibility. Musculoskeletal: Lytic and primarily sclerotic lesion of the right anterior sixth rib noted on image 124 series 5, previously highly hypermetabolic on PET-CT, overall increased sclerosis and lucency in this vicinity from 05/31/2022, compatible with progressive osseous metastatic disease. Sclerotic osseous lesion in the right anterior eighth rib compatible with malignancy, increase conspicuity. Multiple sclerotic osseous metastatic lesions in the thoracic spine including the T5, T6, T10, and T12 levels compatible with malignancy, increase conspicuity from 05/31/2022. Review of the MIP images confirms the above findings. CT ABDOMEN and PELVIS FINDINGS Hepatobiliary: Unremarkable Pancreas: Unremarkable Spleen: Unremarkable Adrenals/Urinary Tract:  Unremarkable Stomach/Bowel: Prominent stool throughout the colon favors constipation. Prominence of stool in the rectal vault favoring fecal impaction. No compelling evidence of stercoral colitis. Vascular/Lymphatic: Atherosclerosis is present, including aortoiliac atherosclerotic disease. No pathologic intra-abdominal adenopathy. Reproductive: Unremarkable Other: Minimal hazy stranding in the omentum and mesentery likely reflecting faint third spacing of fluid. Musculoskeletal: Soft tissue mass involving the left anterior rectus abdominus and adjacent subcutaneous tissues extending to the cutaneous surface, measuring 2.3 by 2.3 cm on image 24 series 3, previously 3.0 by 3.4 cm on 05/31/2022, and previously hypermetabolic prior PET-CT. Dominant sclerotic metastatic lesion in the L4 vertebral body measures 2.2 by 2.0 cm on image 57 series 7. This lesion was previously about 0.8 cm in diameter on 03/24/2022. New sclerotic lesions in the left iliac bone include a 1.1 by 0.8 cm lesion on image 59 of series 3 and a 0.7 cm lesion posteromedially on image 56 series 3. Review of the MIP images confirms the above findings. IMPRESSION: 1. No current filling defect in the pulmonary arterial tree to suggest active pulmonary embolus. 2. Drowned lung appearance of the left lower lobe with complete consolidation and some volume loss in the left lower lobe, and passive atelectasis of much of the left upper lobe. The cavitary masses in the left upper lobe and left lower lobe are roughly similar to 05/31/2022. 3. Mild hazy interstitial accentuation in both lungs along with faint ground-glass opacities, potentially from edema or atypical pneumonia. 4. Large left pleural effusion is associated with some mild rightward shift of cardiac and mediastinal structures. Malignant effusion is a distinct possibility. 5. Increased conspicuity and size of scattered osseous metastatic lesions. 6. The previously hypermetabolic left anterior  abdominal wall mass has reduced in size compared to 05/31/2022. 7. Stable spiculated right lower lobe pulmonary nodule (previously hypermetabolic). 8. Prominent stool throughout the colon favors constipation. Prominence of stool in the rectal vault favoring fecal impaction. No compelling evidence of stercoral colitis. 9. Aortic atherosclerosis. Aortic Atherosclerosis (ICD10-I70.0) and Emphysema (ICD10-J43.9). Electronically Signed   By: Van Clines M.D.   On: 08/31/2022 14:33   DG Chest Port 1 View  Result Date: 08/31/2022 CLINICAL DATA:  Lung cancer with ongoing chemotherapy and radiation therapy, breathing difficulty, questionable sepsis EXAM: PORTABLE CHEST 1 VIEW COMPARISON:  Portable exam 1102 hours compared to 06/01/2022 FINDINGS: Upper normal size of cardiac silhouette. Mediastinal contours and pulmonary vascularity normal. Chronic interstitial infiltrates. Persistent opacities in the LEFT mid lung and LEFT lower lobe with increased LEFT pleural effusion and LEFT lung volume  loss since previous study. No pneumothorax or acute osseous findings. IMPRESSION: Chronic interstitial lung disease. Persistent LEFT lung opacities in LEFT upper and LEFT lower lobes with increased LEFT pleural effusion and basilar atelectasis since prior study. Electronically Signed   By: Lavonia Dana M.D.   On: 08/31/2022 11:10    Microbiology: Results for orders placed or performed during the hospital encounter of 08/31/22  Blood Culture (routine x 2)     Status: None   Collection Time: 08/31/22 11:46 AM   Specimen: BLOOD RIGHT FOREARM  Result Value Ref Range Status   Specimen Description   Final    BLOOD RIGHT FOREARM Performed at [facility] [address], [place], [place] [address]    Special Requests   Final    BOTTLES DRAWN AEROBIC AND ANAEROBIC Blood Culture results may not be optimal due to an inadequate volume of blood received in culture bottles Performed at [facility] [address] [place], [place] [address]    Culture   Final    NO GROWTH 5 DAYS Performed at [facility], [place] [address] [place], [place] [address]    Report Status 09/05/2022 FINAL  Final  Blood Culture (routine x 2)     Status: None   Collection Time: 08/31/22 11:46 AM   Specimen: BLOOD LEFT FOREARM  Result Value Ref Range Status   Specimen Description   Final    BLOOD LEFT FOREARM Performed at [facility] [address] [place], [place] [address]    Special Requests   Final    BOTTLES DRAWN AEROBIC AND ANAEROBIC Blood Culture results may not be optimal due to an inadequate volume of blood received in culture bottles Performed at [facility] [address] [place], [place] [address]    Culture   Final    NO GROWTH 5 DAYS Performed at [facility], [place] [address] [place], [place] [address]    Report Status 09/05/2022 FINAL  Final  MRSA Next Gen by PCR, Nasal     Status: None   Collection Time: 09/01/22  7:58 AM   Specimen: Nasal Mucosa; Nasal Swab  Result Value Ref Range Status   MRSA by PCR Next Gen NOT DETECTED NOT DETECTED Final    Comment: (NOTE) The GeneXpert MRSA Assay (FDA approved for NASAL specimens only), is one component of a comprehensive MRSA colonization surveillance program. It is not intended to diagnose MRSA infection nor to guide or monitor treatment for MRSA infections. Test performance is not FDA approved in patients less than 23 years old. Performed at [facility], [place] [address] [place], [place] [address]   Body fluid culture w Gram Stain     Status: None   Collection Time: 09/01/22 12:21 PM   Specimen: PATH Cytology Peritoneal fluid  Result Value Ref Range Status   Specimen Description   Final    PERITONEAL Performed at [facility], [place] [address], [place], [place] [address]    Special Requests   Final    NONE Performed at [facility], [place] [address] [place], [place] [address]    Gram Stain   Final    WBC PRESENT, PREDOMINANTLY MONONUCLEAR NO ORGANISMS SEEN CYTOSPIN SMEAR    Culture   Final    NO GROWTH Performed at [facility], [place] [address] [place], [place] [address]    Report Status 09/05/2022 FINAL  Final  Labs: CBC: Recent Labs  Lab 09/05/22 0346 09/07/22 1003 09/08/22 0436 09/09/22 1444  WBC 10.4 11.8* 9.1 10.8*  NEUTROABS  --  9.8* 7.4 9.0*  HGB 11.5* 11.1* 9.9* 10.4*  HCT 38.0* 36.5* 32.1* 34.3*  MCV 91.8 93.1 90.4 92.5  PLT 287 315 267 [id number]   Basic Metabolic Panel: Recent Labs  Lab 09/05/22 0346 09/07/22 1003 09/08/22 0436 09/09/22 1444  NA 132* 131* 133* 130*  K 4.5 3.6 3.6 3.9  CL 98 97* 97* 95*  CO2 '28 26 30 28  '$ GLUCOSE 109* 130* 101* 154*  BUN 7* 7* 8 10  CREATININE 0.70 0.67 0.60* 0.62  CALCIUM 10.1 9.7 9.7 9.2  MG 1.7 2.0 2.0 1.9  PHOS  --  3.1 2.5 2.2*   Liver Function Tests: Recent Labs  Lab 09/07/22 1003 09/08/22 0436 09/09/22 1444  AST '17 15 20  '$ ALT '10 11 12  '$ ALKPHOS 120 96 122  BILITOT 0.6 0.8 0.4  PROT 7.2 6.5 6.7  ALBUMIN 2.5* 2.2* 2.3*   CBG: Recent Labs  Lab 09/09/22 1721 09/09/22 2017 09/10/22 0019 09/10/22 0400 09/10/22 0806  GLUCAP 107* 88 129* 103* 120*   Discharge time spent: greater than 30 minutes.  Signed: Raiford Noble, DO [facility] 09/10/2022

## 2022-09-11 LAB — GLUCOSE, CAPILLARY
Glucose-Capillary: 113 mg/dL — ABNORMAL HIGH (ref 70–99)
Glucose-Capillary: 115 mg/dL — ABNORMAL HIGH (ref 70–99)
Glucose-Capillary: 124 mg/dL — ABNORMAL HIGH (ref 70–99)
Glucose-Capillary: 212 mg/dL — ABNORMAL HIGH (ref 70–99)

## 2022-09-11 NOTE — Plan of Care (Signed)
  Problem: Education: Goal: Knowledge of General Education information will improve Description: Including pain rating scale, medication(s)/side effects and non-pharmacologic comfort measures Outcome: Progressing   Problem: Coping: Goal: Level of anxiety will decrease Outcome: Progressing   Problem: Safety: Goal: Ability to remain free from injury will improve Outcome: Progressing   

## 2022-09-11 NOTE — Progress Notes (Signed)
OT Cancellation Note  Patient Details Name: Roger [name] MRN: [id number] DOB: 27-Apr-1956   Cancelled Treatment:    Reason Eval/Treat Not Completed: Other (comment). Patient is going home with Hospice. Will discharge from OT services.  [name] [name] 09/11/2022, 2:03 PM

## 2022-09-11 NOTE — TOC Transition Note (Signed)
Transition of Care [facility]) - CM/SW Discharge Note   Patient Details  Name: Roger [name] MRN: [id number] Date of Birth: April 26, 1956  [facility] CM/SW Contact:  Henrietta Dine, RN Phone Number: 09/11/2022, 1:59 PM   Clinical Narrative:    D/C orders received; spoke w/ pt in room and he agrees to d/c plan to go home w/ hospice; notified Jhonnie Garner, Hospice Liaison and she says pt is ready for d/c from hospice standpoint; contacted pt's sister Tama Gander ([phone]) and she confirms DME has been delivered; she also confirmed d/c address [place], [address]); PTAR called at 14; spoke w/ operator # [id number]; no TOC needs.   Final next level of care: [place] ([facility]) Barriers to Discharge: No Barriers Identified   Patient Goals and CMS Choice      Discharge Placement                    Name of family member notified: Tama Gander (sister) [phone] Patient and family notified of of transfer: 09/11/22  Discharge Plan and Services Additional resources added to the After Visit Summary for                                       Social Determinants of Health (SDOH) Interventions SDOH Screenings   Food Insecurity: No Food Insecurity (08/31/2022)  Housing: Low Risk  (08/31/2022)  Transportation Needs: No Transportation Needs (08/31/2022)  Utilities: Not At Risk (08/31/2022)  Tobacco Use: Medium Risk (09/05/2022)     Readmission Risk Interventions     No data to display

## 2022-09-11 NOTE — Discharge Summary (Signed)
Physician Discharge Summary   Patient: Roger [name] MRN: [id number] DOB: [birth year]  Admit date:     08/31/2022  Discharge date: 09/10/22  Discharge Physician: Raiford Noble, DO   PCP: Aletha Halim., PA-C   Recommendations at discharge:   Further care per hospice protocol  Discharge Diagnoses: Principal Problem:   Pleural effusion Active Problems:   Protein-calorie malnutrition, severe   Palliative care encounter   Metastatic malignant neoplasm ([place])   Goals of care, counseling/discussion   DNR (do not resuscitate)   SCC of lung (small cell carcinoma), left (HCC)   Pleural effusion, left   Acute hypoxic respiratory failure ([place])   Persistent atrial fibrillation ([place])   Secondary hypercoagulable state ([place])   Pressure injury of skin   Atrial fibrillation with RVR ([place])   Acute systolic heart failure ([place])  Resolved Problems:   * No resolved hospital problems. 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 He was brought to the ED hypotensive 77/51, chest x-ray chronic interstitial disease persistent left lung opacities EKG with ?A-fib junctional escape.  Labs with mild leukocytosis anemia CT PE no PE, pulmonary stool favoring constipation, previous hypermetabolic left anterior abdominal wall mass has reduced in size large left pleural effusion, malignant effusion is a distinct possibility, drowned lung appearance of the left lung with complete consolidation and some volume loss in the left lower lobe and passive atelectasis, the cavitary mass in the left upper lobe and left lower lobes are roughly similar to 05/31/2022, mild hazy interstitial accentuation in both lungs along with faint groundglass opacities potentially from edema or atypical  pneumonia. Patient was admitted for acute on chronic respiratory failure with left sided pleural effusion large with background stage IV non-small cell lung cancer, CAP versus postobstructive pneumonia, hypotension lactic acidosis electrolyte imbalance   Interim History He has had multiple thoracentesis while he has been here in on A-fib with RVR and had to be placed on amiodarone drip and cardiology consulted.  Medical oncology is evaluated and feels that he would not benefit from further chemotherapy and after further goals of care discussion patient has elected for home hospice and refused a Pleurx catheter.  Plan was to discharge the patient home with hospice this morning however after hospice met with the patient he initially had verbalized that he had no DME needs but upon the hospice bedside evaluation he requested a hospital bed, over bed table, shower chair and rollator and current plan is to discharge from the hospital tomorrow via EMS once the hospice equipment has been delivered.  He is medically stable for discharge and will need further care per hospice protocol  ADDENDUM 09/11/22: Seen and examined at bedside and he had no acute issues and states that he slept okay.  States that he thinks his hospital bed has gotten delivered.  He remains medically stable to be discharged home with hospice with further care per hospice protocol.  Pain appears well-controlled and he remains on oxygen.  He has no other issues at this time.   Assessment and Plan:  Acute Hypoxic Respiratory Failure from large left side Pleural effusion on  O2 PRN at home, Stage IV non-small cell lung cancer: -CT chest in the ED with large left pleural effusion, rightward shift  of cardiac and mediastinal structure suggestive of malignant effusion.  -S/P thoracentesis on 2/29 with 1liter removed, fluids culture no growth, gram stain negative, -S/p thoracentesis on 3/3 with 2 liter removed.Likely malignant-although cytology  negative for malignancy, chest x-ray with persistent left pleural effusion, but overall not short of breath he remains weak and deconditioned and frail.   -If he decides on hospice he may qualify with Pleurx catheter placement.  -Will need home oxygen.   -Sent message to Dr. Julien Nordmann to clarify regarding his further chemo.   -Patient mentions that his oncologist has been talking about giving 4 cycles after which Keytruda -Dr. Earlie Server evaluated and patient is status post a course of palliative radiotherapy and underwent cycle systemic chemotherapy with carboplatin, paclitaxel and Keytruda in November.  He was unable to resume his first treatment after his first cycle and has been noncompliant with his appointment for chemotherapy. -Oncology evaluated and do not think that he would be a great candidate to resume any systemic chemotherapy at this point because of intolerance and noncompliance and I strongly recommend the patient to consider palliative care and hospice after discharge from the hospital.  From a recurrent left pleural effusion he may benefit of Pleurx catheter placement before discharge for drainage of fluid; IR consulted for further evaluation of Pleurx catheter drainage and discussed options with the patient and at this time he opted to decline Pleurx catheter placement and stated that he would have the hospice team arrange further thoracentesis procedures for possible Pleurx catheter placement in the future if and when he becomes short of breath again -Hospice has met with the patient given that he is elected for home hospice services and we will plan on discharging the patient in the a.m. once equipment is delivered -Provided this prescription at discharge to ensure ongoing symptom management (Prescriptions provided to Pharmacy at the Palliative Care recc's)   CAP vs Post obstructive PNA  with associated sepsis/lactic acidosis -MRSA neg.Urine strep pneumo negative.  Bld Cx NGTD> Cont  unasyn to finish total of 7 days abx treatment.  -Repeat CXR done yesterday AM done and showed "New right lower lobe heterogeneous opacities, concerning for infection aspiration. Unchanged consolidation of the left lower lobe and moderate left pleural effusion."   Hypotension/soft blood pressure -Started on midodrine, atenolol on hold okay to resume at discharge.  Random cortisol level 19    Lactic Acidosis -Multifactorial resolved.   Atrial Fibrillation diagnosed in December 2023 on Eliquis at home on hold, started on amiodarone drip on 3/2 per cards recommendation -Per my discussion with Cards able to transition to po Amiodarone 400 mg po BID x3  and Amiodarone 200 mg po daily there afterwords has now been initiated on 200 mg   DVT /PE -Diagnosed in 05/31/2022 cont Apixaban 5 mg po BID   Electrolyte Disturbances Recent Labs  Lab 09/01/22 0157 09/01/22 0157 09/02/22 0446 09/03/22 0405 09/05/22 0346 09/07/22 1003 09/08/22 0436 09/09/22 1444  NA 132*  --  136 140 132* 131* 133* 130*  K 3.0*  --  3.0* 3.7 4.5 3.6 3.6 3.9  MG  --    < > 1.7  --  1.7 2.0 2.0 1.9  CALCIUM 10.5*  --  10.3 10.3 10.1 9.7 9.7 9.2  PHOS  --    < > 2.9  --   --  3.1 2.5 2.2*   < > = values in this interval not displayed.   -Relete with po Phos-NAK will not replete further given that  he is going home with hospice tomorrow   Constipation -Cont stool softeners.   Anxiety on chronic Xanax cut down his Xanax to  0.25 mg 3 times daily as he has been sleeping and nursing has been holding the Xanax; change him to oral lorazepam as delineated by the palliative care note for discharge   Stage IV non-small cell lung cancer Goals of care: -Squamous cell carcinoma presented with bilateral pulmonary masses and nodules as well as mediastinal and bilateral hilar adenopathy in addition to subcutaneous metastatic disease and bone metastasis diagnosed in October 2023 with PD-L1 expression of 1%.    -S/p palliative  radiotherapy to the painful abdominal lesion under the care of Dr. Sondra Come. Systemic chemotherapy with carboplatin for AUC of 5, paclitaxel 175 Mg/M2 and Keytruda 200 Mg IV every 3 weeks with Neulasta support status post 1 cycle which was given on May 09, 2022 , appeared that was the only chemo he received so far.  -Based upon last oncology note patient was offered hospice/palliative care but he wanted to continue the treatment but he has been very weak he went to have a follow-up and outpatient CT scan, he is at risk of decompensation, palliative care has been engaged may benefit with hospice care-sister understands and dr. -Dr. Julien Nordmann evaluated and does not feel he is great candidate to resume resume systemic chemotherapy at this point because of intolerance and noncompliance and strongly recommends for the patient to consider palliative care and hospice after discharge from the hospital **After further GOC Discussion with palliative care to the differences between valve and hospice were discussed and he is agreed to hospice at home and IR was consulted to evaluate for Pleurx catheter and he did decline at this time -TOC has been ordered to place for hospice referral and on discharge we will send him out with morphine concentrate 10 mg 0.5 mL 5 mg sublingual every 1 hour as needed for pain and shortness of breath, lorazepam 2 mg/ML concentrate solution 1 mg sublingually every 4 hours as needed for anxiety as well as Haldol 2 mg/mL solution 0.5 mill grams lingually every 4 hours as needed for agitation and nausea -Patient is stable for discharge per hospice however equipment has not been set up so discharge has been delayed until tomorrow 09/11/2022   Failure to thrive with overall poor prognosis Severe malnutrition augment diet  Nutrition Status: Nutrition Problem: Severe Malnutrition Etiology: chronic illness (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 Pressure Injury 08/31/22 Sacrum Stage 2 -  Partial thickness loss of dermis presenting as a shallow open injury with a red, pink wound bed without slough. red, open (Active)  08/31/22 1752  Location: Sacrum  Location Orientation:   Staging: Stage 2 -  Partial thickness loss of dermis presenting as a shallow open injury with a red, pink wound bed without slough.  Wound Description (Comments): red, open  Present on Admission: Yes    Normocytic Anemia -Hgb/Hct: Recent Labs  Lab 09/01/22 0157 09/02/22 0446 09/03/22 0405 09/05/22 0346 09/07/22 1003 09/08/22 0436 09/09/22 1444  HGB 10.2* 10.7* 11.1* 11.5* 11.1* 9.9* 10.4*  HCT 33.8* 34.8* 37.1* 38.0* 36.5* 32.1* 34.3*  MCV 92.3 91.8  95.1 91.8 93.1 90.4 92.5   -Continue to Monitor for S/Sx of Bleeding; No overt bleeding noted   Hypoalbuminemia -Patient's Albumin Trend: Recent Labs  Lab 08/31/22 1146 09/01/22 0157 09/07/22 1003 09/08/22 0436 09/09/22 1444  ALBUMIN 2.8* 2.7* 2.5* 2.2* 2.3*   -Continue to Monitor and Trend and repeat CMP in the AM  Nutrition Documentation    [facility] to Hosp-Admission (Current) from 08/31/2022 in [facility]  Nutrition Problem Severe Malnutrition  Etiology chronic illness  [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 red, open 9 days           Consultants: Palliative Care, Hospice, Medical Oncology, Cardiology, IR Procedures performed: As delineated as above Disposition: Hospice care Diet recommendation:  Discharge Diet Orders (From admission, onward)     Start     Ordered   09/10/22 0000  Diet - low sodium heart healthy        09/10/22 1131           Cardiac diet DISCHARGE MEDICATION: Allergies as of 09/11/2022       Reactions   Erythromycin Nausea And Vomiting, Other (See Comments)   GI Intolerance   Prednisone Other (See Comments)   "felt weird"   Sulfa Antibiotics Nausea And Vomiting, Other (See Comments)   Hematemesis (vomiting blood)   Amoxicillin-pot Clavulanate Nausea And Vomiting, Other (See Comments)   GI Intolerance   Meperidine And Related Palpitations, Other (See Comments)   "Heart beats fast"        Medication List     STOP taking these medications    ALPRAZolam 1 MG tablet Commonly known as: XANAX   lidocaine-prilocaine cream Commonly known as: EMLA       TAKE these medications    albuterol 108 (90 Base) MCG/ACT inhaler Commonly known as: VENTOLIN HFA Inhale 2 puffs into the lungs every 6 (six) hours as needed for wheezing or shortness of breath.   amiodarone 200 MG tablet Commonly known as: PACERONE Take 1 tablet (200 mg total) by mouth daily.   apixaban 5 MG Tabs tablet Commonly known as: ELIQUIS Take 1 tablet (5 mg total) by mouth 2 (two) times daily. What changed: Another medication with the same name was removed. Continue taking this medication, and follow the directions you see here.   atenolol 50 MG tablet Commonly known as: TENORMIN Take 50 mg by mouth daily. What changed: Another medication with the same name was removed. Continue taking this medication, and follow the directions you see here.   atorvastatin 10 MG tablet Commonly known as: LIPITOR Take  10 mg by mouth at bedtime.   feeding supplement Liqd Take 237 mLs by mouth 2 (two) times daily between meals.   Ensure Max Protein Liqd Take 330 mLs (11 oz total) by mouth daily.   fluticasone 50 MCG/ACT nasal spray Commonly known as: FLONASE Place 2 sprays into both nostrils daily as needed for allergies.   haloperidol  2 MG/ML solution Commonly known as: HALDOL Take 0.3 mLs (0.6 mg total) by mouth every 4 (four) hours as needed for agitation.   LORazepam 2 MG/ML concentrated solution Commonly known as: LORazepam Intensol Take 0.5 mLs (1 mg total) by mouth every 4 (four) hours as needed for anxiety.   metFORMIN 500 MG 24 hr tablet Commonly known as: GLUCOPHAGE-XR Take 500 mg by mouth See admin instructions. Take 500 mg by mouth with the largest meal of the day   midodrine 2.5 MG tablet Commonly known as: PROAMATINE Take 1 tablet (2.5 mg total) by mouth 3 (three) times daily with meals.   mirtazapine 15 MG tablet Commonly known as: REMERON Take 1 tablet (15 mg total) by mouth at bedtime.   morphine CONCENTRATE 10 mg / 0.5 ml concentrated solution Take 0.25 mLs (5 mg total) by mouth every hour as needed for severe pain or shortness of breath.   ondansetron 4 MG tablet Commonly known as: ZOFRAN Take 1 tablet (4 mg total) by mouth every 6 (six) hours as needed for nausea.   polyethylene glycol 17 g packet Commonly known as: MIRALAX / GLYCOLAX Take 17 g by mouth daily.   prochlorperazine 10 MG tablet Commonly known as: COMPAZINE Take 1 tablet (10 mg total) by mouth every 6 (six) hours as needed for nausea or vomiting.   saccharomyces boulardii 250 MG capsule Commonly known as: FLORASTOR Take 1 capsule (250 mg total) by mouth 2 (two) times daily.   senna-docusate 8.6-50 MG tablet Commonly known as: Senokot-S Take 1 tablet by mouth 2 (two) times daily.   TYLENOL 500 MG tablet Generic drug: acetaminophen Take 500-1,000 mg by mouth every 6 (six) hours as needed for  mild pain or headache.               Durable Medical Equipment  (From admission, onward)           Start     Ordered   09/09/22 1650  For home use only DME Walker rolling  Once       Question Answer Comment  Walker: With [employer] Wheels   Patient needs a walker to treat with the following condition Generalized weakness      09/09/22 1649              Discharge Care Instructions  (From admission, onward)           Start     Ordered   09/10/22 0000  Discharge wound care:       Comments: Frequent Turning   09/10/22 1131            Follow-up Information     Aletha Halim., PA-C Follow up in 1 week(s).   Specialty: Family Medicine Contact information: [place]  [id number] [phone]                Discharge Exam: Danley Danker Weights   09/09/22 0455 09/10/22 0358 09/11/22 0446  Weight: 58.4 kg 53.5 kg 53.2 kg   Vitals:   09/10/22 2028 09/11/22 0446  BP: 116/81 103/84  Pulse: 89 88  Resp: 18 18  Temp: 98.3 F (36.8 C) 98.9 F (37.2 C)  SpO2: 98% 97%   Examination: Physical Exam:  Constitutional: Thin chronically ill-appearing Caucasian male in no acute distress Respiratory: Diminished to auscultation bilaterally with coarse breath sounds and has some rhonchi and some crackles noted.  Has a normal respiratory effort and is wearing supplemental oxygen via nasal cannula  Cardiovascular: Irregularly irregular and mildly tachycardic, no murmurs / rubs / gallops. S1 and S2 auscultated.  Very slight extremity edema Abdomen: Soft, non-tender, non-distended.  Bowel sounds positive.  GU: Deferred. Musculoskeletal: No clubbing / cyanosis of digits/nails. No joint deformity upper and lower extremities.  Skin: No rashes, lesions, ulcers on limited skin evaluation. No induration; Warm and dry.  Neurologic: CN 2-12 grossly intact with no focal deficits. Romberg sign and cerebellar reflexes not assessed.  Psychiatric: Normal  judgment and insight. Alert and oriented x 3.  Slightly anxious mood and appropriate affect.   Condition at discharge: stable  The results of significant diagnostics from this hospitalization (including imaging, microbiology, ancillary and laboratory) are listed below for reference.   Imaging Studies: DG CHEST PORT 1 VIEW  Result Date: 09/08/2022 CLINICAL DATA:  Shortness of breath EXAM: PORTABLE CHEST 1 VIEW COMPARISON:  Chest x-ray dated September 07 2022 FINDINGS: Visualized cardiac and mediastinal contours are unchanged. New right lower lobe heterogeneous opacities. Unchanged consolidation of the left lower lobe. Stable moderate left pleural effusion. No evidence of pneumothorax. IMPRESSION: 1. New right lower lobe heterogeneous opacities, concerning for infection aspiration. 2. Unchanged consolidation of the left lower lobe and moderate left pleural effusion. Electronically Signed   By: Yetta Glassman M.D.   On: 09/08/2022 08:12   DG CHEST PORT 1 VIEW  Result Date: 09/07/2022 CLINICAL DATA:  Shortness of breath, pleural effusion EXAM: PORTABLE CHEST 1 VIEW COMPARISON:  Chest radiograph 09/05/2022 FINDINGS: The cardiomediastinal silhouette is stable. The small to moderate-sized left pleural effusion is similar to the prior study. Aeration of the lungs is unchanged, with dense retrocardiac opacity and opacity in the left midlung consistent with known masses seen on prior CT. The right lung is clear. There is no significant right effusion. There is no pneumothorax There is no acute osseous abnormality. IMPRESSION: Overall, no significant interval change in lung aeration since the study from 2 days prior with unchanged left pleural effusion and opacities in the left base and midlung. Electronically Signed   By: Valetta Mole M.D.   On: 09/07/2022 12:15   US THORACENTESIS ASP PLEURAL SPACE W/IMG GUIDE  Result Date: 09/06/2022 INDICATION: Stage IV non-small cell lung cancer with recurrent left pleural  effusion. Request for therapeutic thoracentesis. EXAM: ULTRASOUND GUIDED LEFT THORACENTESIS MEDICATIONS: 1% lidocaine 10 mL COMPLICATIONS: None immediate. PROCEDURE: An ultrasound guided thoracentesis was thoroughly discussed with the patient and questions answered. The benefits, risks, alternatives and complications were also discussed. The patient understands and wishes to proceed with the procedure. Written consent was obtained. Ultrasound was performed to localize and [place] an adequate pocket of fluid in the left chest. The area was then prepped and draped in the normal sterile fashion. 1% Lidocaine was used for local anesthesia. Under ultrasound guidance a 6 Fr Safe-T-Centesis catheter was introduced. Thoracentesis was performed. The catheter was removed and a dressing applied. FINDINGS: A total of approximately 2.1 L of clear amber fluid was removed. IMPRESSION: Successful ultrasound guided left thoracentesis yielding 2.1 L of pleural fluid. No pneumothorax on post-procedure chest x-ray. Procedure performed by: Gareth Eagle, PA-C Electronically Signed   By: Aletta Edouard M.D.   On: 09/06/2022 09:28   DG Chest 2 View  Result Date: 09/05/2022 CLINICAL DATA:  Pleural effusion EXAM: CHEST - 2 VIEW COMPARISON:  Chest radiograph dated 09/03/2022 FINDINGS: Decreased lung aeration. Increased patchy and interstitial opacities, left-greater-than-right with persistent dense left retrocardiac opacity. Similar small to moderate left pleural effusion. No pneumothorax. Left heart  border is obscured. The visualized skeletal structures are unremarkable. IMPRESSION: 1. Similar small to moderate left pleural effusion. No pneumothorax. 2. Decreased lung aeration with increased bilateral patchy and interstitial opacities, left-greater-than-right. Persistent dense left retrocardiac opacity in keeping with known cavitary masses. Electronically Signed   By: Darrin Nipper M.D.   On: 09/05/2022 16:08   DG Chest 1 View  Result Date:  09/03/2022 CLINICAL DATA:  Pleural effusion.  Status post thoracentesis. EXAM: CHEST  1 VIEW COMPARISON:  Chest x-ray September 03, 2022 FINDINGS: No pneumothorax after thoracentesis. The left-sided pleural effusion remains but is smaller. The opacity underlying the left pleural effusion is again identified, more conspicuous in the interval given the smaller effusion. Reticular changes in the right lung base are stable. No other interval changes. IMPRESSION: 1. No pneumothorax after thoracentesis. 2. The left pleural effusion is smaller. 3. The opacity underlying the left pleural effusion is more conspicuous in the interval given the smaller effusion. 4. Reticular changes in the right lung base are stable. Electronically Signed   By: Dorise Bullion III M.D.   On: 09/03/2022 14:42   DG CHEST PORT 1 VIEW  Result Date: 09/03/2022 CLINICAL DATA:  Shortness of breath. EXAM: PORTABLE CHEST 1 VIEW COMPARISON:  09/01/2022 FINDINGS: Large left pleural effusion with underlying pulmonary opacity. Diffuse interstitial coarsening primarily attributed to emphysema. Findings are underestimated relative to recent CT. Normal heart size where not obscured. No pneumothorax IMPRESSION: 1. Unchanged large left pleural effusion and pulmonary opacity. 2. Advanced emphysema. Electronically Signed   By: Jorje Guild M.D.   On: 09/03/2022 08:18   ECHOCARDIOGRAM LIMITED  Result Date: 09/02/2022    ECHOCARDIOGRAM LIMITED REPORT   Patient Name:   Roger [name] Date of Exam: 09/02/2022 Medical Rec #:  [id number]   Height:       71.0 in Accession #:    [id number]  Weight:       127.9 lb Date of Birth:  [birth year]  BSA:          1.743 m Patient Age:    67 years    BP:           100/72 mmHg Patient Gender: M           HR:           74 bpm. Exam Location:  [facility] Procedure: Limited Echo, Cardiac Doppler and Limited Color Doppler Indications:    Congestive Heart Failure I50.9  History:        Patient has prior history of Echocardiogram  examinations, most                 recent 06/01/2022. Signs/Symptoms:Shortness of Breath; Risk                 Factors:Former Smoker and Hypertension.  Sonographer:    Greer Pickerel Referring Phys: [id number] SARA-MAIZ A THOMAS  Sonographer Comments: Image acquisition challenging due to patient body habitus and Image acquisition challenging due to respiratory motion. IMPRESSIONS  1. Left ventricular ejection fraction, by estimation, is 45 to 50%. The left ventricle has mildly decreased function. Left ventricular diastolic parameters are indeterminate.  2. Right ventricular systolic function mild to moderately reduced. The right ventricular size is moderately enlarged. Tricuspid regurgitation signal is inadequate for assessing PA pressure.  3. Large pleural effusion in the left lateral region.  4. No evidence of mitral valve regurgitation.  5. Aortic valve regurgitation is not visualized.  6. The inferior vena cava is dilated in  size with >50% respiratory variability, suggesting right atrial pressure of 8 mmHg. Comparison(s): Pericardial effusion has improved, plerual effusion is new from prior. FINDINGS  Left Ventricle: Left ventricular ejection fraction, by estimation, is 45 to 50%. The left ventricle has mildly decreased function. The left ventricular internal cavity size was normal in size. There is no left ventricular hypertrophy. Left ventricular diastolic parameters are indeterminate. Right Ventricle: The right ventricular size is moderately enlarged. Right ventricular systolic function mild to moderately reduced. Tricuspid regurgitation signal is inadequate for assessing PA pressure. Pericardium: Trivial pericardial effusion is present. The pericardial effusion is surrounding the apex. There is excessive respiratory variation in septal movement. Aortic Valve: Aortic valve regurgitation is not visualized. Aorta: The aortic root is normal in size and structure. Venous: The inferior vena cava is dilated in size with  greater than 50% respiratory variability, suggesting right atrial pressure of 8 mmHg. Additional Comments: There is a large pleural effusion in the left lateral region. Spectral Doppler performed. Color Doppler performed.  LEFT VENTRICLE PLAX 2D LVIDd:         4.70 cm     Diastology LVIDs:         3.70 cm     LV e' medial:   7.29 cm/s LV PW:         0.70 cm     LV E/e' medial: 5.6 LV IVS:        0.60 cm LVOT diam:     2.20 cm LVOT Area:     3.80 cm  LV Volumes (MOD) LV vol d, MOD A2C: 71.8 ml LV vol d, MOD A4C: 70.9 ml LV vol s, MOD A2C: 42.2 ml LV vol s, MOD A4C: 32.9 ml LV SV MOD A2C:     29.6 ml LV SV MOD A4C:     70.9 ml LV SV MOD BP:      35.3 ml RIGHT VENTRICLE RV S prime:     7.07 cm/s TAPSE (M-mode): 0.8 cm LEFT ATRIUM         Index LA diam:    3.70 cm 2.12 cm/m   AORTA Ao Root diam: 3.45 cm MITRAL VALVE MV Area (PHT): 4.36 cm    SHUNTS MV Decel Time: 174 msec    Systemic Diam: 2.20 cm MV E velocity: 41.10 cm/s MV A velocity: 68.60 cm/s MV E/A ratio:  0.60 Rudean Haskell MD Electronically signed by Rudean Haskell MD Signature Date/Time: 09/02/2022/4:19:24 PM    Final    US THORACENTESIS ASP PLEURAL SPACE W/IMG GUIDE  Result Date: 09/01/2022 INDICATION: Left pleural effusion EXAM: ULTRASOUND GUIDED LEFT therapeutic THORACENTESIS MEDICATIONS: 5 cc 1% lidocaine COMPLICATIONS: None immediate. PROCEDURE: An ultrasound guided thoracentesis was thoroughly discussed with the patient and questions answered. The benefits, risks, alternatives and complications were also discussed. The patient understands and wishes to proceed with the procedure. Written consent was obtained. Ultrasound was performed to localize and Jae an adequate pocket of fluid in the left chest. The area was then prepped and draped in the normal sterile fashion. 1% Lidocaine was used for local anesthesia. Under ultrasound guidance a 6 Fr Safe-T-Centesis catheter was introduced. Thoracentesis was performed. The catheter was removed  and a dressing applied. FINDINGS: A total of approximately 1 L of amber fluid was removed. Ordering provider did not request laboratory samples. IMPRESSION: Successful ultrasound guided left thoracentesis yielding 1 L of pleural fluid. Follow-up chest x-ray revealed no evidence of pneumothorax. Read by: Reatha Armour, PA-C Electronically Signed   By: York Cerise  Earleen Newport D.O.   On: 09/01/2022 12:40   DG CHEST PORT 1 VIEW  Result Date: 09/01/2022 CLINICAL DATA:  Post thoracentesis. EXAM: PORTABLE CHEST 1 VIEW COMPARISON:  Chest radiograph and CTA chest 1 day prior FINDINGS: The cardiomediastinal silhouette is grossly stable. There is persistent moderate-to-large left pleural effusion following thoracentesis, decreased in size with slightly improved aeration of the left lung. The right lung is clear. There is no significant right effusion. There is no appreciable pneumothorax There is no acute osseous abnormality. IMPRESSION: Persistent moderate-to-large left pleural effusion following thoracentesis but with slightly improved aeration of the left lung. No appreciable pneumothorax. Electronically Signed   By: Valetta Mole M.D.   On: 09/01/2022 11:09   CT Angio Chest PE W and/or Wo Contrast  Result Date: 08/31/2022 CLINICAL DATA:  Stage IV bronchogenic carcinoma. Acute pulmonary embolus in November 2023. Current chemotherapy and radiation therapy. Difficulty breathing and possible sepsis. * Tracking Code: BO * EXAM: CT ANGIOGRAPHY CHEST CT ABDOMEN AND PELVIS WITH CONTRAST TECHNIQUE: Multidetector CT imaging of the chest was performed using the standard protocol during bolus administration of intravenous contrast. Multiplanar CT image reconstructions and MIPs were obtained to evaluate the vascular anatomy. Multidetector CT imaging of the abdomen and pelvis was performed using the standard protocol during bolus administration of intravenous contrast. RADIATION DOSE REDUCTION: This exam was performed according to the  departmental dose-optimization program which includes automated exposure control, adjustment of the mA and/or kV according to patient size and/or use of iterative reconstruction technique. CONTRAST:  153m OMNIPAQUE IOHEXOL 350 MG/ML SOLN COMPARISON:  Multiple exams, including CTA chest 05/31/2022 and PET-CT 03/24/2022 FINDINGS: CTA CHEST FINDINGS Cardiovascular: No current filling defect in the pulmonary arterial tree to suggest active pulmonary embolus. Coronary, aortic arch, and branch vessel atherosclerotic vascular disease. Mediastinum/Nodes: Right paratracheal node 0.9 cm in short axis on image 37 series 3, formerly the same. Right eccentric subcarinal adenopathy 1.4 cm in short axis on image 47 series 3, previously 1.8 cm. Lungs/Pleura: Severe emphysema. 1.9 by 1.4 cm spiculated right lower lobe pulmonary nodule on image 95 series 5, very ill-defined margins, previously about 1.8 by 1.4 cm on 05/31/2022. Interstitial accentuation and hazy ground-glass opacity in the right lung and aerated portion of the left lung. Drowned lung appearance of the left lower lobe with complete consolidation and some volume loss in the left lower lobe. There is likewise passive atelectasis of much of the left upper lobe. The cavitary masses in the left upper lobe and left lower lobe are somewhat obscured, but hypodensity suggesting underlying mass measuring 6.6 by 5.6 cm in the left lower lobe observed on image 65 series 3. A left upper lobe masslike process peripherally measures 5.5 by 3.5 cm on image 50 series 3. Both are roughly similar to the 05/31/2022 exam. The large left pleural effusion is associated with some mild rightward shift of cardiac and mediastinal structures. Malignant effusion is a distinct possibility. Musculoskeletal: Lytic and primarily sclerotic lesion of the right anterior sixth rib noted on image 124 series 5, previously highly hypermetabolic on PET-CT, overall increased sclerosis and lucency in this  vicinity from 05/31/2022, compatible with progressive osseous metastatic disease. Sclerotic osseous lesion in the right anterior eighth rib compatible with malignancy, increase conspicuity. Multiple sclerotic osseous metastatic lesions in the thoracic spine including the T5, T6, T10, and T12 levels compatible with malignancy, increase conspicuity from 05/31/2022. Review of the MIP images confirms the above findings. CT ABDOMEN and PELVIS FINDINGS Hepatobiliary: Unremarkable Pancreas: Unremarkable Spleen:  Unremarkable Adrenals/Urinary Tract: Unremarkable Stomach/Bowel: Prominent stool throughout the colon favors constipation. Prominence of stool in the rectal vault favoring fecal impaction. No compelling evidence of stercoral colitis. Vascular/Lymphatic: Atherosclerosis is present, including aortoiliac atherosclerotic disease. No pathologic intra-abdominal adenopathy. Reproductive: Unremarkable Other: Minimal hazy stranding in the omentum and mesentery likely reflecting faint third spacing of fluid. Musculoskeletal: Soft tissue mass involving the left anterior rectus abdominus and adjacent subcutaneous tissues extending to the cutaneous surface, measuring 2.3 by 2.3 cm on image 24 series 3, previously 3.0 by 3.4 cm on 05/31/2022, and previously hypermetabolic prior PET-CT. Dominant sclerotic metastatic lesion in the L4 vertebral body measures 2.2 by 2.0 cm on image 57 series 7. This lesion was previously about 0.8 cm in diameter on 03/24/2022. New sclerotic lesions in the left iliac bone include a 1.1 by 0.8 cm lesion on image 59 of series 3 and a 0.7 cm lesion posteromedially on image 56 series 3. Review of the MIP images confirms the above findings. IMPRESSION: 1. No current filling defect in the pulmonary arterial tree to suggest active pulmonary embolus. 2. Drowned lung appearance of the left lower lobe with complete consolidation and some volume loss in the left lower lobe, and passive atelectasis of much of the  left upper lobe. The cavitary masses in the left upper lobe and left lower lobe are roughly similar to 05/31/2022. 3. Mild hazy interstitial accentuation in both lungs along with faint ground-glass opacities, potentially from edema or atypical pneumonia. 4. Large left pleural effusion is associated with some mild rightward shift of cardiac and mediastinal structures. Malignant effusion is a distinct possibility. 5. Increased conspicuity and size of scattered osseous metastatic lesions. 6. The previously hypermetabolic left anterior abdominal wall mass has reduced in size compared to 05/31/2022. 7. Stable spiculated right lower lobe pulmonary nodule (previously hypermetabolic). 8. Prominent stool throughout the colon favors constipation. Prominence of stool in the rectal vault favoring fecal impaction. No compelling evidence of stercoral colitis. 9. Aortic atherosclerosis. Aortic Atherosclerosis (ICD10-I70.0) and Emphysema (ICD10-J43.9). Electronically Signed   By: Van Clines M.D.   On: 08/31/2022 14:33   CT ABDOMEN PELVIS W CONTRAST  Result Date: 08/31/2022 CLINICAL DATA:  Stage IV bronchogenic carcinoma. Acute pulmonary embolus in November 2023. Current chemotherapy and radiation therapy. Difficulty breathing and possible sepsis. * Tracking Code: BO * EXAM: CT ANGIOGRAPHY CHEST CT ABDOMEN AND PELVIS WITH CONTRAST TECHNIQUE: Multidetector CT imaging of the chest was performed using the standard protocol during bolus administration of intravenous contrast. Multiplanar CT image reconstructions and MIPs were obtained to evaluate the vascular anatomy. Multidetector CT imaging of the abdomen and pelvis was performed using the standard protocol during bolus administration of intravenous contrast. RADIATION DOSE REDUCTION: This exam was performed according to the departmental dose-optimization program which includes automated exposure control, adjustment of the mA and/or kV according to patient size and/or use  of iterative reconstruction technique. CONTRAST:  166m OMNIPAQUE IOHEXOL 350 MG/ML SOLN COMPARISON:  Multiple exams, including CTA chest 05/31/2022 and PET-CT 03/24/2022 FINDINGS: CTA CHEST FINDINGS Cardiovascular: No current filling defect in the pulmonary arterial tree to suggest active pulmonary embolus. Coronary, aortic arch, and branch vessel atherosclerotic vascular disease. Mediastinum/Nodes: Right paratracheal node 0.9 cm in short axis on image 37 series 3, formerly the same. Right eccentric subcarinal adenopathy 1.4 cm in short axis on image 47 series 3, previously 1.8 cm. Lungs/Pleura: Severe emphysema. 1.9 by 1.4 cm spiculated right lower lobe pulmonary nodule on image 95 series 5, very ill-defined margins, previously about  1.8 by 1.4 cm on 05/31/2022. Interstitial accentuation and hazy ground-glass opacity in the right lung and aerated portion of the left lung. Drowned lung appearance of the left lower lobe with complete consolidation and some volume loss in the left lower lobe. There is likewise passive atelectasis of much of the left upper lobe. The cavitary masses in the left upper lobe and left lower lobe are somewhat obscured, but hypodensity suggesting underlying mass measuring 6.6 by 5.6 cm in the left lower lobe observed on image 65 series 3. A left upper lobe masslike process peripherally measures 5.5 by 3.5 cm on image 50 series 3. Both are roughly similar to the 05/31/2022 exam. The large left pleural effusion is associated with some mild rightward shift of cardiac and mediastinal structures. Malignant effusion is a distinct possibility. Musculoskeletal: Lytic and primarily sclerotic lesion of the right anterior sixth rib noted on image 124 series 5, previously highly hypermetabolic on PET-CT, overall increased sclerosis and lucency in this vicinity from 05/31/2022, compatible with progressive osseous metastatic disease. Sclerotic osseous lesion in the right anterior eighth rib compatible  with malignancy, increase conspicuity. Multiple sclerotic osseous metastatic lesions in the thoracic spine including the T5, T6, T10, and T12 levels compatible with malignancy, increase conspicuity from 05/31/2022. Review of the MIP images confirms the above findings. CT ABDOMEN and PELVIS FINDINGS Hepatobiliary: Unremarkable Pancreas: Unremarkable Spleen: Unremarkable Adrenals/Urinary Tract: Unremarkable Stomach/Bowel: Prominent stool throughout the colon favors constipation. Prominence of stool in the rectal vault favoring fecal impaction. No compelling evidence of stercoral colitis. Vascular/Lymphatic: Atherosclerosis is present, including aortoiliac atherosclerotic disease. No pathologic intra-abdominal adenopathy. Reproductive: Unremarkable Other: Minimal hazy stranding in the omentum and mesentery likely reflecting faint third spacing of fluid. Musculoskeletal: Soft tissue mass involving the left anterior rectus abdominus and adjacent subcutaneous tissues extending to the cutaneous surface, measuring 2.3 by 2.3 cm on image 24 series 3, previously 3.0 by 3.4 cm on 05/31/2022, and previously hypermetabolic prior PET-CT. Dominant sclerotic metastatic lesion in the L4 vertebral body measures 2.2 by 2.0 cm on image 57 series 7. This lesion was previously about 0.8 cm in diameter on 03/24/2022. New sclerotic lesions in the left iliac bone include a 1.1 by 0.8 cm lesion on image 59 of series 3 and a 0.7 cm lesion posteromedially on image 56 series 3. Review of the MIP images confirms the above findings. IMPRESSION: 1. No current filling defect in the pulmonary arterial tree to suggest active pulmonary embolus. 2. Drowned lung appearance of the left lower lobe with complete consolidation and some volume loss in the left lower lobe, and passive atelectasis of much of the left upper lobe. The cavitary masses in the left upper lobe and left lower lobe are roughly similar to 05/31/2022. 3. Mild hazy interstitial  accentuation in both lungs along with faint ground-glass opacities, potentially from edema or atypical pneumonia. 4. Large left pleural effusion is associated with some mild rightward shift of cardiac and mediastinal structures. Malignant effusion is a distinct possibility. 5. Increased conspicuity and size of scattered osseous metastatic lesions. 6. The previously hypermetabolic left anterior abdominal wall mass has reduced in size compared to 05/31/2022. 7. Stable spiculated right lower lobe pulmonary nodule (previously hypermetabolic). 8. Prominent stool throughout the colon favors constipation. Prominence of stool in the rectal vault favoring fecal impaction. No compelling evidence of stercoral colitis. 9. Aortic atherosclerosis. Aortic Atherosclerosis (ICD10-I70.0) and Emphysema (ICD10-J43.9). Electronically Signed   By: Van Clines M.D.   On: 08/31/2022 14:33   DG Chest  Port 1 View  Result Date: 08/31/2022 CLINICAL DATA:  Lung cancer with ongoing chemotherapy and radiation therapy, breathing difficulty, questionable sepsis EXAM: PORTABLE CHEST 1 VIEW COMPARISON:  Portable exam 1102 hours compared to 06/01/2022 FINDINGS: Upper normal size of cardiac silhouette. Mediastinal contours and pulmonary vascularity normal. Chronic interstitial infiltrates. Persistent opacities in the LEFT mid lung and LEFT lower lobe with increased LEFT pleural effusion and LEFT lung volume loss since previous study. No pneumothorax or acute osseous findings. IMPRESSION: Chronic interstitial lung disease. Persistent LEFT lung opacities in LEFT upper and LEFT lower lobes with increased LEFT pleural effusion and basilar atelectasis since prior study. Electronically Signed   By: Lavonia Dana M.D.   On: 08/31/2022 11:10    Microbiology: Results for orders placed or performed during the hospital encounter of 08/31/22  Blood Culture (routine x 2)     Status: None   Collection Time: 08/31/22 11:46 AM   Specimen: BLOOD RIGHT  FOREARM  Result Value Ref Range Status   Specimen Description   Final    BLOOD RIGHT FOREARM Performed at [facility] [address], [place], [place] [address]    Special Requests   Final    BOTTLES DRAWN AEROBIC AND ANAEROBIC Blood Culture results may not be optimal due to an inadequate volume of blood received in culture bottles Performed at [facility] [address], [place], [place] [address]    Culture   Final    NO GROWTH 5 DAYS Performed at [facility], [place] [name] [place], [place] [address]    Report Status 09/05/2022 FINAL  Final  Blood Culture (routine x 2)     Status: None   Collection Time: 08/31/22 11:46 AM   Specimen: BLOOD LEFT FOREARM  Result Value Ref Range Status   Specimen Description   Final    BLOOD LEFT FOREARM Performed at [facility] [address], [place], [place] [address]    Special Requests   Final    BOTTLES DRAWN AEROBIC AND ANAEROBIC Blood Culture results may not be optimal due to an inadequate volume of blood received in culture bottles Performed at [facility] [address], [place], [place] [address]    Culture   Final    NO GROWTH 5 DAYS Performed at [facility], [place] [address], [place], [place] [address]    Report Status 09/05/2022 FINAL  Final  MRSA Next Gen by PCR, Nasal     Status: None   Collection Time: 09/01/22  7:58 AM   Specimen: Nasal Mucosa; Nasal Swab  Result Value Ref Range Status   MRSA by PCR Next Gen NOT DETECTED NOT DETECTED Final    Comment: (NOTE) The GeneXpert MRSA Assay (FDA approved for NASAL specimens only), is one component of a comprehensive MRSA colonization surveillance program. It is not intended to diagnose MRSA infection nor to guide or monitor treatment for MRSA infections. Test performance is not FDA approved in patients less than 7 years old. Performed at [facility], [place]  [name] [place], [place] [address]   Body fluid culture w Gram Stain     Status: None   Collection Time: 09/01/22 12:21 PM   Specimen: PATH Cytology Peritoneal fluid  Result Value Ref Range Status   Specimen Description   Final    PERITONEAL Performed at [facility], [place] [name] [place],  [address]    Special Requests  Final    NONE Performed at [facility], [place] [address] [place], [place] [address]    Gram Stain   Final    WBC PRESENT, PREDOMINANTLY MONONUCLEAR NO ORGANISMS SEEN CYTOSPIN SMEAR    Culture   Final    NO GROWTH Performed at [facility], [place] [address] [place], [place] [address]    Report Status 09/05/2022 FINAL  Final   Labs: CBC: Recent Labs  Lab 09/05/22 0346 09/07/22 1003 09/08/22 0436 09/09/22 1444  WBC 10.4 11.8* 9.1 10.8*  NEUTROABS  --  9.8* 7.4 9.0*  HGB 11.5* 11.1* 9.9* 10.4*  HCT 38.0* 36.5* 32.1* 34.3*  MCV 91.8 93.1 90.4 92.5  PLT 287 315 267 [id number]    Basic Metabolic Panel: Recent Labs  Lab 09/05/22 0346 09/07/22 1003 09/08/22 0436 09/09/22 1444  NA 132* 131* 133* 130*  K 4.5 3.6 3.6 3.9  CL 98 97* 97* 95*  CO2 '28 26 30 28  '$ GLUCOSE 109* 130* 101* 154*  BUN 7* 7* 8 10  CREATININE 0.70 0.67 0.60* 0.62  CALCIUM 10.1 9.7 9.7 9.2  MG 1.7 2.0 2.0 1.9  PHOS  --  3.1 2.5 2.2*    Liver Function Tests: Recent Labs  Lab 09/07/22 1003 09/08/22 0436 09/09/22 1444  AST '17 15 20  '$ ALT '10 11 12  '$ ALKPHOS 120 96 122  BILITOT 0.6 0.8 0.4  PROT 7.2 6.5 6.7  ALBUMIN 2.5* 2.2* 2.3*    CBG: Recent Labs  Lab 09/10/22 1632 09/10/22 2029 09/11/22 0001 09/11/22 0447 09/11/22 0811  GLUCAP 204* 73 115* 113* 124*    Discharge time spent: greater than 30 minutes.  Signed: Raiford Noble, DO [facility] 09/11/2022

## 2022-09-14 ENCOUNTER — Inpatient Hospital Stay: Payer: BC Managed Care – PPO | Admitting: Internal Medicine

## 2022-09-14 ENCOUNTER — Inpatient Hospital Stay: Payer: BC Managed Care – PPO

## 2022-09-16 ENCOUNTER — Ambulatory Visit: Payer: BC Managed Care – PPO

## 2022-09-22 ENCOUNTER — Other Ambulatory Visit: Payer: BC Managed Care – PPO

## 2022-10-05 ENCOUNTER — Ambulatory Visit: Payer: BC Managed Care – PPO | Admitting: Internal Medicine

## 2022-10-05 ENCOUNTER — Other Ambulatory Visit: Payer: BC Managed Care – PPO

## 2022-10-05 ENCOUNTER — Ambulatory Visit: Payer: BC Managed Care – PPO

## 2022-10-07 ENCOUNTER — Ambulatory Visit: Payer: BC Managed Care – PPO

## 2022-10-18 ENCOUNTER — Telehealth: Payer: Self-pay | Admitting: Medical Oncology

## 2022-10-18 NOTE — Telephone Encounter (Signed)
Pt passed away yesterday . Kathie Rhodes said thank you to Dr. Arbutus Ped and his team for all the care you provided to [facility].

## 2022-11-02 DEATH — deceased
